# Patient Record
Sex: Male | Born: 1945 | Race: White | Hispanic: No | Marital: Married | Smoking: Former smoker
Health system: Southern US, Community
[De-identification: ages and names within clinical notes are randomized; demographics above are authoritative.]

## PROBLEM LIST (undated history)

## (undated) DIAGNOSIS — I779 Disorder of arteries and arterioles, unspecified: Secondary | ICD-10-CM

## (undated) DIAGNOSIS — I10 Essential (primary) hypertension: Secondary | ICD-10-CM

## (undated) DIAGNOSIS — C801 Malignant (primary) neoplasm, unspecified: Secondary | ICD-10-CM

## (undated) DIAGNOSIS — Z9289 Personal history of other medical treatment: Secondary | ICD-10-CM

## (undated) DIAGNOSIS — I739 Peripheral vascular disease, unspecified: Secondary | ICD-10-CM

## (undated) DIAGNOSIS — Z87448 Personal history of other diseases of urinary system: Secondary | ICD-10-CM

## (undated) DIAGNOSIS — Z9889 Other specified postprocedural states: Secondary | ICD-10-CM

## (undated) DIAGNOSIS — Z8669 Personal history of other diseases of the nervous system and sense organs: Secondary | ICD-10-CM

## (undated) DIAGNOSIS — E785 Hyperlipidemia, unspecified: Secondary | ICD-10-CM

## (undated) DIAGNOSIS — I2510 Atherosclerotic heart disease of native coronary artery without angina pectoris: Secondary | ICD-10-CM

## (undated) DIAGNOSIS — Z8546 Personal history of malignant neoplasm of prostate: Secondary | ICD-10-CM

## (undated) HISTORY — PX: LIPOMA EXCISION: SHX5283

## (undated) HISTORY — PX: ANGIOPLASTY: SHX39

## (undated) HISTORY — DX: Malignant (primary) neoplasm, unspecified: C80.1

## (undated) HISTORY — DX: Atherosclerotic heart disease of native coronary artery without angina pectoris: I25.10

## (undated) HISTORY — PX: POLYPECTOMY: SHX149

## (undated) HISTORY — DX: Personal history of malignant neoplasm of prostate: Z85.46

## (undated) HISTORY — DX: Peripheral vascular disease, unspecified: I73.9

## (undated) HISTORY — DX: Personal history of other medical treatment: Z92.89

## (undated) HISTORY — DX: Disorder of arteries and arterioles, unspecified: I77.9

## (undated) HISTORY — DX: Other specified postprocedural states: Z98.890

## (undated) HISTORY — PX: COLONOSCOPY: SHX174

## (undated) HISTORY — DX: Hyperlipidemia, unspecified: E78.5

## (undated) HISTORY — PX: CATARACT EXTRACTION W/ INTRAOCULAR LENS IMPLANT: SHX1309

## (undated) HISTORY — DX: Personal history of other diseases of urinary system: Z87.448

## (undated) HISTORY — PX: WRIST GANGLION EXCISION: SHX840

## (undated) HISTORY — PX: HEMORRHOID SURGERY: SHX153

## (undated) HISTORY — DX: Personal history of other diseases of the nervous system and sense organs: Z86.69

## (undated) HISTORY — DX: Essential (primary) hypertension: I10

---

## 1998-06-19 ENCOUNTER — Inpatient Hospital Stay (HOSPITAL_COMMUNITY): Admission: EM | Admit: 1998-06-19 | Discharge: 1998-06-22 | Payer: Self-pay | Admitting: Emergency Medicine

## 1998-06-19 ENCOUNTER — Encounter: Payer: Self-pay | Admitting: Emergency Medicine

## 2000-07-26 ENCOUNTER — Encounter: Payer: Self-pay | Admitting: Emergency Medicine

## 2000-07-26 ENCOUNTER — Inpatient Hospital Stay (HOSPITAL_COMMUNITY): Admission: EM | Admit: 2000-07-26 | Discharge: 2000-07-30 | Payer: Self-pay | Admitting: Emergency Medicine

## 2000-07-26 HISTORY — PX: CARDIAC CATHETERIZATION: SHX172

## 2000-07-27 ENCOUNTER — Encounter: Payer: Self-pay | Admitting: Cardiovascular Disease

## 2001-10-14 ENCOUNTER — Ambulatory Visit (HOSPITAL_COMMUNITY): Admission: RE | Admit: 2001-10-14 | Discharge: 2001-10-14 | Payer: Self-pay | Admitting: Internal Medicine

## 2003-02-27 DIAGNOSIS — I2510 Atherosclerotic heart disease of native coronary artery without angina pectoris: Secondary | ICD-10-CM

## 2003-02-27 HISTORY — PX: PTCA: SHX146

## 2003-02-27 HISTORY — DX: Atherosclerotic heart disease of native coronary artery without angina pectoris: I25.10

## 2003-08-04 ENCOUNTER — Observation Stay (HOSPITAL_COMMUNITY): Admission: EM | Admit: 2003-08-04 | Discharge: 2003-08-05 | Payer: Self-pay | Admitting: Emergency Medicine

## 2003-08-31 ENCOUNTER — Inpatient Hospital Stay (HOSPITAL_BASED_OUTPATIENT_CLINIC_OR_DEPARTMENT_OTHER): Admission: RE | Admit: 2003-08-31 | Discharge: 2003-08-31 | Payer: Self-pay | Admitting: Cardiovascular Disease

## 2004-01-06 ENCOUNTER — Ambulatory Visit: Payer: Self-pay | Admitting: Internal Medicine

## 2004-09-11 ENCOUNTER — Ambulatory Visit: Payer: Self-pay | Admitting: Internal Medicine

## 2004-09-18 ENCOUNTER — Ambulatory Visit: Payer: Self-pay | Admitting: Internal Medicine

## 2004-11-20 ENCOUNTER — Ambulatory Visit: Payer: Self-pay | Admitting: Internal Medicine

## 2004-12-22 ENCOUNTER — Ambulatory Visit: Payer: Self-pay | Admitting: Internal Medicine

## 2005-01-26 ENCOUNTER — Ambulatory Visit: Payer: Self-pay | Admitting: Cardiovascular Disease

## 2005-03-01 ENCOUNTER — Ambulatory Visit: Admission: RE | Admit: 2005-03-01 | Discharge: 2005-04-02 | Payer: Self-pay | Admitting: Radiation Oncology

## 2005-03-29 DIAGNOSIS — Z8546 Personal history of malignant neoplasm of prostate: Secondary | ICD-10-CM

## 2005-03-29 HISTORY — DX: Personal history of malignant neoplasm of prostate: Z85.46

## 2005-03-29 HISTORY — PX: PROSTATECTOMY: SHX69

## 2005-04-18 ENCOUNTER — Inpatient Hospital Stay (HOSPITAL_COMMUNITY): Admission: RE | Admit: 2005-04-18 | Discharge: 2005-04-20 | Payer: Self-pay | Admitting: Urology

## 2005-04-18 ENCOUNTER — Encounter (INDEPENDENT_AMBULATORY_CARE_PROVIDER_SITE_OTHER): Payer: Self-pay | Admitting: Specialist

## 2005-04-26 ENCOUNTER — Emergency Department (HOSPITAL_COMMUNITY): Admission: EM | Admit: 2005-04-26 | Discharge: 2005-04-26 | Payer: Self-pay | Admitting: Emergency Medicine

## 2005-04-30 ENCOUNTER — Observation Stay (HOSPITAL_COMMUNITY): Admission: EM | Admit: 2005-04-30 | Discharge: 2005-05-01 | Payer: Self-pay | Admitting: Urology

## 2005-06-21 ENCOUNTER — Ambulatory Visit: Payer: Self-pay | Admitting: Cardiovascular Disease

## 2005-10-08 ENCOUNTER — Ambulatory Visit: Payer: Self-pay

## 2005-10-08 ENCOUNTER — Ambulatory Visit: Payer: Self-pay | Admitting: Cardiovascular Disease

## 2005-12-12 ENCOUNTER — Ambulatory Visit: Payer: Self-pay | Admitting: Internal Medicine

## 2005-12-12 LAB — CONVERTED CEMR LAB
ALT: 25 units/L (ref 0–40)
Bacteria, U Microscopic: NEGATIVE /hpf
Basophils Absolute: 0 10*3/uL (ref 0.0–0.1)
Basophils Relative: 0.4 % (ref 0.0–1.0)
Bilirubin Urine: NEGATIVE
CO2: 29 meq/L (ref 19–32)
Chloride: 106 meq/L (ref 96–112)
Chol/HDL Ratio, serum: 3.9
Creatinine, Ser: 1.3 mg/dL (ref 0.4–1.5)
HCT: 45.1 % (ref 39.0–52.0)
HDL: 44.1 mg/dL (ref >39.0)
Hemoglobin: 15.5 g/dL (ref 13.0–17.0)
Lymphocytes Relative: 35 % (ref 12.0–46.0)
MCHC: 34.5 g/dL (ref 30.0–36.0)
MCV: 93.8 fL (ref 78.0–100.0)
Mucus, UA: NEGATIVE
Nitrite: NEGATIVE
PSA: 0.01 ng/mL — ABNORMAL LOW (ref 0.10–4.00)
Platelets: 253 10*3/uL (ref 150–400)
Sodium: 142 meq/L (ref 135–145)
Specific Gravity, Urine: 1.02 (ref 1.000–1.03)
TSH: 1.28 microintl units/mL (ref 0.35–5.50)
Total Protein, Urine: NEGATIVE mg/dL
Total Protein: 6.8 g/dL (ref 6.0–8.3)
Triglyceride fasting, serum: 136 mg/dL (ref 0–149)
WBC: 6.1 10*3/uL (ref 4.5–10.5)

## 2005-12-19 ENCOUNTER — Ambulatory Visit: Payer: Self-pay | Admitting: Internal Medicine

## 2006-02-25 ENCOUNTER — Ambulatory Visit: Payer: Self-pay | Admitting: Internal Medicine

## 2006-02-25 LAB — CONVERTED CEMR LAB
ALT: 32 units/L (ref 0–40)
Albumin: 4 g/dL (ref 3.5–5.2)
Chol/HDL Ratio, serum: 4.1
Cholesterol: 155 mg/dL (ref 0–200)
HDL: 37.8 mg/dL — ABNORMAL LOW (ref >39.0)
LDL Cholesterol: 91 mg/dL (ref 0–99)
Total Bilirubin: 1 mg/dL (ref 0.3–1.2)
Triglyceride fasting, serum: 133 mg/dL (ref 0–149)

## 2006-02-27 ENCOUNTER — Ambulatory Visit: Payer: Self-pay | Admitting: Internal Medicine

## 2006-06-27 ENCOUNTER — Ambulatory Visit: Payer: Self-pay | Admitting: Internal Medicine

## 2006-10-21 ENCOUNTER — Ambulatory Visit: Payer: Self-pay | Admitting: Cardiovascular Disease

## 2006-11-06 DIAGNOSIS — Z8669 Personal history of other diseases of the nervous system and sense organs: Secondary | ICD-10-CM

## 2006-11-06 HISTORY — DX: Personal history of other diseases of the nervous system and sense organs: Z86.69

## 2007-03-07 ENCOUNTER — Encounter: Payer: Self-pay | Admitting: *Deleted

## 2007-03-07 DIAGNOSIS — Z8546 Personal history of malignant neoplasm of prostate: Secondary | ICD-10-CM

## 2007-03-07 DIAGNOSIS — Z951 Presence of aortocoronary bypass graft: Secondary | ICD-10-CM

## 2007-03-07 DIAGNOSIS — I2510 Atherosclerotic heart disease of native coronary artery without angina pectoris: Secondary | ICD-10-CM

## 2007-03-07 DIAGNOSIS — Z9079 Acquired absence of other genital organ(s): Secondary | ICD-10-CM | POA: Insufficient documentation

## 2007-03-07 DIAGNOSIS — Z9189 Other specified personal risk factors, not elsewhere classified: Secondary | ICD-10-CM

## 2007-03-07 DIAGNOSIS — Z9861 Coronary angioplasty status: Secondary | ICD-10-CM | POA: Insufficient documentation

## 2007-03-07 DIAGNOSIS — E7800 Pure hypercholesterolemia, unspecified: Secondary | ICD-10-CM

## 2007-03-07 DIAGNOSIS — Z87448 Personal history of other diseases of urinary system: Secondary | ICD-10-CM

## 2007-03-07 DIAGNOSIS — I10 Essential (primary) hypertension: Secondary | ICD-10-CM | POA: Insufficient documentation

## 2007-05-13 ENCOUNTER — Encounter: Payer: Self-pay | Admitting: Internal Medicine

## 2007-05-22 ENCOUNTER — Telehealth: Payer: Self-pay | Admitting: Internal Medicine

## 2007-07-16 ENCOUNTER — Ambulatory Visit: Payer: Self-pay | Admitting: Internal Medicine

## 2007-07-16 LAB — CONVERTED CEMR LAB
AST: 23 units/L (ref 0–37)
Alkaline Phosphatase: 48 units/L (ref 39–117)
BUN: 15 mg/dL (ref 6–23)
Bacteria, UA: NEGATIVE
Bilirubin Urine: NEGATIVE
CO2: 29 meq/L (ref 19–32)
Calcium: 9.4 mg/dL (ref 8.4–10.5)
Eosinophils Absolute: 0 10*3/uL (ref 0.0–0.7)
GFR calc non Af Amer: 65 mL/min
Glucose, Bld: 122 mg/dL — ABNORMAL HIGH (ref 70–99)
HCT: 46.5 % (ref 39.0–52.0)
Hemoglobin: 15.7 g/dL (ref 13.0–17.0)
Ketones, ur: NEGATIVE mg/dL
Leukocytes, UA: NEGATIVE
MCHC: 33.8 g/dL (ref 30.0–36.0)
MCV: 94.8 fL (ref 78.0–100.0)
Monocytes Absolute: 0.5 10*3/uL (ref 0.1–1.0)
Monocytes Relative: 9.5 % (ref 3.0–12.0)
Mucus, UA: NEGATIVE
Nitrite: NEGATIVE
RBC: 4.9 M/uL (ref 4.22–5.81)
Specific Gravity, Urine: 1.01 (ref 1.000–1.03)
Squamous Epithelial / LPF: NEGATIVE /lpf
TSH: 1.06 microintl units/mL (ref 0.35–5.50)
Urine Glucose: NEGATIVE mg/dL
Urobilinogen, UA: 0.2 (ref 0.0–1.0)
WBC, UA: NONE SEEN cells/hpf
WBC: 5.5 10*3/uL (ref 4.5–10.5)

## 2007-07-23 ENCOUNTER — Ambulatory Visit: Payer: Self-pay | Admitting: Internal Medicine

## 2007-07-23 DIAGNOSIS — E119 Type 2 diabetes mellitus without complications: Secondary | ICD-10-CM

## 2007-07-23 DIAGNOSIS — E118 Type 2 diabetes mellitus with unspecified complications: Secondary | ICD-10-CM | POA: Insufficient documentation

## 2007-10-13 ENCOUNTER — Ambulatory Visit: Payer: Self-pay | Admitting: Cardiovascular Disease

## 2008-01-07 ENCOUNTER — Encounter: Payer: Self-pay | Admitting: Internal Medicine

## 2008-05-12 ENCOUNTER — Encounter: Payer: Self-pay | Admitting: Internal Medicine

## 2008-08-24 ENCOUNTER — Ambulatory Visit: Payer: Self-pay | Admitting: Internal Medicine

## 2008-08-24 LAB — CONVERTED CEMR LAB
AST: 21 units/L (ref 0–37)
Alkaline Phosphatase: 49 units/L (ref 39–117)
BUN: 22 mg/dL (ref 6–23)
Bilirubin, Direct: 0.2 mg/dL (ref 0.0–0.3)
Chloride: 105 meq/L (ref 96–112)
Eosinophils Absolute: 0.1 10*3/uL (ref 0.0–0.7)
Eosinophils Relative: 1.1 % (ref 0.0–5.0)
GFR calc non Af Amer: 64.54 mL/min (ref >60)
HCT: 41.9 % (ref 39.0–52.0)
HDL: 40.8 mg/dL (ref >39.00)
Leukocytes, UA: NEGATIVE
Lymphs Abs: 2.2 10*3/uL (ref 0.7–4.0)
MCHC: 35.4 g/dL (ref 30.0–36.0)
Neutro Abs: 2.6 10*3/uL (ref 1.4–7.7)
Neutrophils Relative %: 48.1 % (ref 43.0–77.0)
Nitrite: NEGATIVE
RBC: 4.52 M/uL (ref 4.22–5.81)
RDW: 12 % (ref 11.5–14.6)
Sodium: 140 meq/L (ref 135–145)
Total Protein, Urine: NEGATIVE mg/dL
Triglycerides: 75 mg/dL (ref 0.0–149.0)
Urobilinogen, UA: 0.2 (ref 0.0–1.0)
VLDL: 15 mg/dL (ref 0.0–40.0)
WBC: 5.5 10*3/uL (ref 4.5–10.5)
pH: 5.5 (ref 5.0–8.0)

## 2008-09-01 ENCOUNTER — Ambulatory Visit: Payer: Self-pay | Admitting: Internal Medicine

## 2008-11-17 ENCOUNTER — Encounter: Payer: Self-pay | Admitting: Internal Medicine

## 2008-12-09 ENCOUNTER — Ambulatory Visit: Payer: Self-pay | Admitting: Cardiovascular Disease

## 2008-12-15 ENCOUNTER — Telehealth: Payer: Self-pay | Admitting: Internal Medicine

## 2008-12-24 ENCOUNTER — Encounter: Payer: Self-pay | Admitting: Internal Medicine

## 2009-06-23 ENCOUNTER — Encounter: Payer: Self-pay | Admitting: Internal Medicine

## 2009-10-28 ENCOUNTER — Ambulatory Visit: Payer: Self-pay | Admitting: Internal Medicine

## 2009-10-28 LAB — CONVERTED CEMR LAB
ALT: 44 units/L (ref 0–53)
Albumin: 4.2 g/dL (ref 3.5–5.2)
Alkaline Phosphatase: 44 units/L (ref 39–117)
Basophils Absolute: 0 10*3/uL (ref 0.0–0.1)
Basophils Relative: 0.3 % (ref 0.0–3.0)
Bilirubin Urine: NEGATIVE
Bilirubin, Direct: 0.2 mg/dL (ref 0.0–0.3)
Calcium: 9.3 mg/dL (ref 8.4–10.5)
Chloride: 100 meq/L (ref 96–112)
Cholesterol: 157 mg/dL (ref 0–200)
HCT: 44.5 % (ref 39.0–52.0)
Hemoglobin: 15.4 g/dL (ref 13.0–17.0)
Lymphocytes Relative: 33.7 % (ref 12.0–46.0)
MCHC: 34.6 g/dL (ref 30.0–36.0)
MCV: 95.3 fL (ref 78.0–100.0)
Neutro Abs: 4.2 10*3/uL (ref 1.4–7.7)
Neutrophils Relative %: 55.1 % (ref 43.0–77.0)
PSA: 0 ng/mL — ABNORMAL LOW (ref 0.10–4.00)
Platelets: 239 10*3/uL (ref 150.0–400.0)
RBC: 4.67 M/uL (ref 4.22–5.81)
RDW: 13.5 % (ref 11.5–14.6)
Sodium: 137 meq/L (ref 135–145)
Specific Gravity, Urine: 1.02 (ref 1.000–1.030)
TSH: 2.34 microintl units/mL (ref 0.35–5.50)
Total CHOL/HDL Ratio: 4
Total Protein: 6.6 g/dL (ref 6.0–8.3)
Triglycerides: 194 mg/dL — ABNORMAL HIGH (ref 0.0–149.0)
VLDL: 38.8 mg/dL (ref 0.0–40.0)

## 2009-11-03 ENCOUNTER — Ambulatory Visit: Payer: Self-pay | Admitting: Internal Medicine

## 2009-12-08 ENCOUNTER — Ambulatory Visit: Payer: Self-pay | Admitting: Cardiovascular Disease

## 2009-12-28 ENCOUNTER — Encounter: Payer: Self-pay | Admitting: Internal Medicine

## 2010-03-28 NOTE — Assessment & Plan Note (Signed)
Summary: cpx-lb   Vital Signs:  Patient profile:   65 year old male Height:      65 inches Weight:      178 pounds BMI:     29.73 O2 Sat:      96 % Temp:     97.9 degrees F oral Pulse rate:   69 / minute BP sitting:   144 / 78  (left arm) Cuff size:   regular  Vitals Entered By: Alysia Penna (November 03, 2009 1:29 PM) CC: cpx/cp sma Comments pt needs refill on vytorin sent to [facility]. /cp sma  Vision Screening:      Vision Comments: eye exam may 2011. normal with no changes.   Primary Care Provider:  Norins  CC:  cpx/cp sma.  History of Present Illness: Patient presents for maintenance exam. He reports that he is generally feeling except for the normal augue and ache of 64 years. He is walking 2.5 miles a day, down from 5 miles, but no limiting symptoms, e.g. chest pain or SOB. He does have some heel pain. He is living a healthy life-style: low fat, low carb diet with controlled calorie intake.   Current Medications (verified): 1)  Vytorin 10-80 Mg  Tabs (Ezetimibe-Simvastatin) .... At Bedtime 2)  Multivitamins   Tabs (Multiple Vitamin) .... Take One Tablet Once Daily 3)  Lisinopril 10 Mg  Tabs (Lisinopril) .... Take 1/2 Tablet Once Daily 4)  Alprazolam 0.25 Mg Tabs (Alprazolam) .Marland Kitchen.. 1 Q 6 As Needed 5)  Aspirin 81 Mg  Tabs (Aspirin) .... Daily  Allergies (verified): No Known Drug Allergies  Past History:  Past Medical History: Last updated: 12/06/2008 Current Problems:  HYPERCHOLESTEROLEMIA (ICD-272.0) HYPERTENSION (ICD-401.9) CORONARY ARTERY DISEASE (ICD-414.00) prev stents RCA and circ. Cath 2005 patent with 70% PDA ROUTINE GENERAL MEDICAL EXAM@HEALTH  CARE FACL (ICD-V70.0) AODM (ICD-250.00) EXCISION OF GANGLION CYST, WRIST, HX OF (ICD-V15.89) PROSTATE CANCER, HX OF (ICD-V10.46) HEMATURIA, HX OF (ICD-V13.09) * I&D THROMBOSED HEMORRHOID PROSTATECTOMY, HX OF (ICD-V45.77) PERCUTANEOUS TRANSLUMINAL CORONARY ANGIOPLASTY, HX OF (ICD-V45.82) LIPOMA,EXCISION  (ICD-214.9) DETACHED RETINA-left  treated at [facility].  Sept '08 - gas bubble, then laser surgery  Oct '08  Past Surgical History: Last updated: 12/06/2008  Cardiac CatheterizationRESULTS: Initially the stenosis in the proximal to mid right coronary artery was estimated at 95%.  Following stenting, this improved to 0%.  There wasresidual mild disease in the ostium of the proximal right coronary artery Successful stenting of the proximal to mid right coronary artery with improvement in stent diameter narrowing from 95% to 0%.Proc. Date: 06/03/02Adm. Date:  [id number] Attending:  Charlton Haws CharlesCC: Rosalyn Gess. Norins, M.D. [facility]    EXCISION OF GANGLION CYST, WRIST, HX OF (ICD-V15.89) * I&D THROMBOSED HEMORRHOID PROSTATECTOMY, HX OF (ICD-V45.77) PERCUTANEOUS TRANSLUMINAL CORONARY ANGIOPLASTY, HX OF (ICD-V45.82) LIPOMA,EXCISION (ICD-214.9)  Family History: Last updated: 06-Aug-2007 father-deceased @56 : CAD/MI fatal, which was 2nd; smoker mother - deceased @ 51: died after GB surgery Neg- colon cancer Sister - DM   Social History: Last updated: 06-Aug-2007 HSG Married '64 - 17 years divorced; '81 - 4 years divorced; '94 - 0 chidlren work: Dealer, Architect  Risk Factors: Alcohol Use: 0 (09/01/2008) Caffeine Use: 0 (09/01/2008) Diet: heart healthy (09/01/2008) Exercise: yes (09/01/2008)  Risk Factors: Smoking Status: quit (09/01/2008)  Review of Systems  The patient denies anorexia, fever, weight loss, weight gain, vision loss, decreased hearing, hoarseness, chest pain, dyspnea on exertion, peripheral edema, headaches, abdominal pain, severe indigestion/heartburn, muscle weakness, suspicious skin lesions, difficulty walking, depression, unusual weight change,  enlarged lymph nodes, and angioedema.    Physical Exam  General:  WNWD white male in no distress Head:  Normocephalic and atraumatic without obvious abnormalities. No apparent alopecia or balding. Eyes:  No corneal  or conjunctival inflammation noted. EOMI. Perrla. Funduscopic exam benign, without hemorrhages, exudates or papilledema. Vision grossly normal. Ears:  External ear exam shows no significant lesions or deformities.  Otoscopic examination reveals clear canals, tympanic membranes are intact bilaterally without bulging, retraction, inflammation or discharge. Hearing is grossly normal bilaterally. Nose:  no external deformity and no external erythema.   Mouth:  Oral mucosa and oropharynx without lesions or exudates.  Teeth in good repair. Neck:  supple, full ROM, no thyromegaly, and no carotid bruits.   Chest Wall:  no deformities and no masses.   Lungs:  Normal respiratory effort, chest expands symmetrically. Lungs are clear to auscultation, no crackles or wheezes. Heart:  Normal rate and regular rhythm. S1 and S2 normal without gallop, murmur, click, rub or other extra sounds. Abdomen:  soft, non-tender, normal bowel sounds, no masses, no guarding, and no hepatomegaly.   Prostate:  deferred Msk:  normal ROM, no joint tenderness, no joint swelling, no joint warmth, no redness over joints, and no joint instability.   Pulses:  2+ radial pulses Extremities:  No clubbing, cyanosis, edema, or deformity noted with normal full range of motion of all joints.   Neurologic:  alert & oriented X3, cranial nerves II-XII intact, strength normal in all extremities, sensation intact to light touch, sensation intact to pinprick, and DTRs symmetrical and normal.   Skin:  turgor normal, color normal, no rashes, no suspicious lesions, and no ulcerations.   Cervical Nodes:  no anterior cervical adenopathy and no posterior cervical adenopathy.   Inguinal Nodes:  no R inguinal adenopathy and no L inguinal adenopathy.   Psych:  Oriented X3, memory intact for recent and remote, and normally interactive.     Impression & Recommendations:  Problem # 1:  HYPERCHOLESTEROLEMIA (ICD-272.0)  His updated medication list for this  problem includes:    Vytorin 10-80 Mg Tabs (Ezetimibe-simvastatin) .Marland Kitchen... At bedtime  Labs Reviewed: SGOT: 32 (10/28/2009)   SGPT: 44 (10/28/2009)   HDL:43.70 (10/28/2009), 40.80 (08/24/2008)  LDL:75 (10/28/2009), 69 (08/24/2008)  Chol:157 (10/28/2009), 125 (08/24/2008)  Trig:194.0 (10/28/2009), 75.0 (08/24/2008)  Excellent control. Will continue present medications  Problem # 2:  HYPERTENSION (ICD-401.9)  His updated medication list for this problem includes:    Lisinopril 10 Mg Tabs (Lisinopril) .Marland Kitchen... Take 1/2 tablet once daily  BP today: 144/78 Prior BP: 130/70 (12/09/2008)  Labs Reviewed: K+: 4.7 (10/28/2009) Creat: : 1.2 (10/28/2009)  Mild elevation in BP today, generally well controlled. Plan is to continue present medications.  Problem # 3:  AODM (ICD-250.00) Reviewed labs back to '07 - highest serum glucose was 122 with all other readings being in normal range.  Plan - prudent diet but no other treatment or testing.   His updated medication list for this problem includes:    Lisinopril 10 Mg Tabs (Lisinopril) .Marland Kitchen... Take 1/2 tablet once daily    Aspirin 81 Mg Tabs (Aspirin) .Marland Kitchen... Daily  Labs Reviewed: Creat: 1.2 (10/28/2009)     Problem # 4:  PROSTATE CANCER, HX OF (ICD-V10.46) Doing very well and followed by urology on a routine basis. PSA was undetectable.  Problem # 5:  Preventive Health Care (ICD-V70.0) Normal interval history. Normal physical exam. Lab results are excellent. He is current with pneumovax. He reports having had zostavax at hospital.  He is current with colorectal cancer screening with last exam in '03.  He is 100% independent in ADLs. 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   In summary - a delightful gentleman who is medically stable and doing well. He will return as needed or 1 year.   Complete Medication List: 1)  Vytorin 10-80 Mg Tabs (Ezetimibe-simvastatin)  .... At bedtime 2)  Multivitamins Tabs (Multiple vitamin) .... Take one tablet once daily 3)  Lisinopril 10 Mg Tabs (Lisinopril) .... Take 1/2 tablet once daily 4)  Alprazolam 0.25 Mg Tabs (Alprazolam) .Marland Kitchen.. 1 q 6 as needed 5)  Aspirin 81 Mg Tabs (Aspirin) .... Daily  Other Orders: Pneumococcal Vaccine (16109) Admin 1st Vaccine (60454)   Patient: Clifford [name] Note: All result statuses are Final unless otherwise noted.  Tests: (1) Lipid Panel (LIPID)   Cholesterol               157 mg/dL                   0-981     ATP III Classification            Desirable:  < 200 mg/dL                    Borderline High:  200 - 239 mg/dL               High:  > = 240 mg/dL   Triglycerides        [H]  194.0 mg/dL                 1.9-147.8     Normal:  <150 mg/dL     Borderline High:  295 - 199 mg/dL   HDL                       62.13 mg/dL                 >08.65   VLDL Cholesterol          38.8 mg/dL                  7.8-46.9   LDL Cholesterol           75 mg/dL                    6-29  CHO/HDL Ratio:  CHD Risk                             4                    Men          Women     1/2 Average Risk     3.4          3.3     Average Risk          5.0          4.4     2X Average Risk          9.6          7.1     3X Average Risk          15.0          11.0  Tests: (2) BMP (METABOL)   Sodium                    137 mEq/L                   135-145   Potassium                 4.7 mEq/L                   3.5-5.1   Chloride                  100 mEq/L                   96-112   Carbon Dioxide            27 mEq/L                    19-32   Glucose              [H]  102 mg/dL                   16-10   BUN                       14 mg/dL                    9-60   Creatinine                1.2 mg/dL                   4.5-4.0   Calcium                   9.3 mg/dL                   9.8-11.9   GFR                       67.55 mL/min                >60  Tests: (3) CBC Platelet w/Diff  (CBCD)   White Cell Count          7.7 K/uL                    4.5-10.5   Red Cell Count            4.67 Mil/uL                 4.22-5.81   Hemoglobin                15.4 g/dL                   14.7-82.9   Hematocrit                44.5 %                      39.0-52.0   MCV                       95.3 fl                     78.0-100.0   MCHC  34.6 g/dL                   16.1-09.6   RDW                       13.5 %                      11.5-14.6   Platelet Count            239.0 K/uL                  150.0-400.0   Neutrophil %              55.1 %                      43.0-77.0   Lymphocyte %              33.7 %                      12.0-46.0   Monocyte %                9.6 %                       3.0-12.0   Eosinophils%              1.3 %                       0.0-5.0   Basophils %               0.3 %                       0.0-3.0   Neutrophill Absolute      4.2 K/uL                    1.4-7.7   Lymphocyte Absolute       2.6 K/uL                    0.7-4.0   Monocyte Absolute         0.7 K/uL                    0.1-1.0  Eosinophils, Absolute                             0.1 K/uL                    0.0-0.7   Basophils Absolute        0.0 K/uL                    0.0-0.1  Tests: (4) Hepatic/Liver Function Panel (HEPATIC)   Total Bilirubin           1.2 mg/dL                   0.4-5.4   Direct Bilirubin          0.2 mg/dL                   0.9-8.1   Alkaline Phosphatase      44 U/L  39-117   AST                       32 U/L                      0-37   ALT                       44 U/L                      0-53   Total Protein             6.6 g/dL                    6.0-7.3   Albumin                   4.2 g/dL                    7.1-0.6  Tests: (5) TSH (TSH)   FastTSH                   2.34 uIU/mL                 0.35-5.50  Tests: (6) Prostate Specific Antigen (PSA)   PSA-Hyb              [L]                              0.10-4.00       RESULT: 0.00  Repeated and verified X2. ng/mL  Tests: (7) UDip w/Micro (URINE)   Color                     LT. YELLOW       RANGE:  Yellow;Lt. Yellow   Clarity                   CLEAR                       Clear   Specific Gravity          1.020                       1.000 - 1.030   Urine Ph                  5.5                         5.0-8.0   Protein                   NEGATIVE                    Negative   Urine Glucose             NEGATIVE                    Negative   Ketones                   NEGATIVE                    Negative   Urine Bilirubin  NEGATIVE                    Negative   Blood                     MODERATE                    Negative   Urobilinogen              0.2                         0.0 - 1.0   Leukocyte Esterace        NEGATIVE                    Negative   Nitrite                   NEGATIVE                    Negative   Urine RBC                 0-2/hpf                     0-2/hpf   Urine Mucus               Presence of                 None   Urine Epith               Rare(0-4/hpf)               Rare(0-4/hpf)Prescriptions: VYTORIN 10-80 MG  TABS (EZETIMIBE-SIMVASTATIN) at bedtime  #90 x 3   Entered and Authorized by:   Jacques Navy MD   Signed by:   Jacques Navy MD on 11/03/2009   Method used:   Faxed to ...       [facility] (mail-order)             , [place]         Ph: [phone]       Fax: [phone]   RxID:   [id number]    Immunizations Administered:  Pneumonia Vaccine:    Vaccine Type: Pneumovax    Site: left deltoid    Mfr: Merck    Dose: 0.5 ml    Route: IM    Given by: Alysia Penna SMA    Exp. Date: 03/19/2011    Lot #: [id number]    VIS given: 01/31/09 version given November 03, 2009.

## 2010-03-28 NOTE — Letter (Signed)
[facility]   Imported By: Sherian Rein 06/28/2009 15:06:59  _____________________________________________________________________  External Attachment:    Type:   Image     Comment:   External Document

## 2010-03-28 NOTE — Letter (Signed)
Summary: [facility]   Imported By: Sherian Rein 01/04/2010 10:42:07  _____________________________________________________________________  External Attachment:    Type:   Image     Comment:   External Document

## 2010-03-28 NOTE — Assessment & Plan Note (Signed)
Summary: F1Y/DM      Allergies Added: NKDA  Primary Provider:  Norins  CC:  check up.  History of Present Illness: Clifford [name] is seen today for F/U of CAD,HTN,elevated lipids.  He has a distant history of CAD with PCI in 95, 2000 and 2002. Last cath in 2005 showed patent stents to the RCA and Circ wit h70% PDA disease in a small vessel.  He has done well with medical therapy.  His last myovue in 2008 showed small inferobasal MI with no ischemia.  He walks 5 miles/day with no angina.  His risk factors are well modified and lab work followed by Dr.Norrins.  There has been no PND, orhtopnea, palpitations, or edema.  He has a history of prostate CA and has regular F/U with Dr. Ludwig Lean with an undetectable PSA  Review of labs from 9/11 shows LDL of 75 with normal LFT's   Current Problems (verified): 1)  Hypercholesterolemia  (ICD-272.0) 2)  Hypertension  (ICD-401.9) 3)  Coronary Artery Disease  (ICD-414.00) 4)  Coronary Artery Bypass Graft, Four Vessel, Hx of  (ICD-V45.81) 5)  Routine General Medical Exam@health  Care Facl  (ICD-V70.0) 6)  Aodm  (ICD-250.00) 7)  Excision of Ganglion Cyst, Wrist, Hx of  (ICD-V15.89) 8)  Prostate Cancer, Hx of  (ICD-V10.46) 9)  Hematuria, Hx of  (ICD-V13.09) 10)  I&d Thrombosed Hemorrhoid  () 11)  Prostatectomy, Hx of  (ICD-V45.77) 12)  Percutaneous Transluminal Coronary Angioplasty, Hx of  (ICD-V45.82) 13)  Lipoma,excision  (ICD-214.9)  Current Medications (verified): 1)  Vytorin 10-80 Mg  Tabs (Ezetimibe-Simvastatin) .... At Bedtime 2)  Multivitamins   Tabs (Multiple Vitamin) .... Take One Tablet Once Daily 3)  Lisinopril 10 Mg  Tabs (Lisinopril) .... Take 1/2 Tablet Once Daily 4)  Alprazolam 0.25 Mg Tabs (Alprazolam) .Marland Kitchen.. 1 Q 6 As Needed 5)  Aspirin 81 Mg  Tabs (Aspirin) .... Daily  Allergies (verified): No Known Drug Allergies  Past History:  Past Medical History: Last updated: 12/06/2008 Current Problems:  HYPERCHOLESTEROLEMIA  (ICD-272.0) HYPERTENSION (ICD-401.9) CORONARY ARTERY DISEASE (ICD-414.00) prev stents RCA and circ. Cath 2005 patent with 70% PDA ROUTINE GENERAL MEDICAL EXAM@HEALTH  CARE FACL (ICD-V70.0) AODM (ICD-250.00) EXCISION OF GANGLION CYST, WRIST, HX OF (ICD-V15.89) PROSTATE CANCER, HX OF (ICD-V10.46) HEMATURIA, HX OF (ICD-V13.09) * I&D THROMBOSED HEMORRHOID PROSTATECTOMY, HX OF (ICD-V45.77) PERCUTANEOUS TRANSLUMINAL CORONARY ANGIOPLASTY, HX OF (ICD-V45.82) LIPOMA,EXCISION (ICD-214.9) DETACHED RETINA-left  treated at [facility].  Sept '08 - gas bubble, then laser surgery  Oct '08  Past Surgical History: Last updated: 12/06/2008  Cardiac CatheterizationRESULTS: Initially the stenosis in the proximal to mid right coronary artery was estimated at 95%.  Following stenting, this improved to 0%.  There wasresidual mild disease in the ostium of the proximal right coronary artery Successful stenting of the proximal to mid right coronary artery with improvement in stent diameter narrowing from 95% to 0%.Proc. Date: 06/03/02Adm. Date:  [id number] Attending:  Charlton Haws CharlesCC: Rosalyn Gess. Norins, M.D. [facility]    EXCISION OF GANGLION CYST, WRIST, HX OF (ICD-V15.89) * I&D THROMBOSED HEMORRHOID PROSTATECTOMY, HX OF (ICD-V45.77) PERCUTANEOUS TRANSLUMINAL CORONARY ANGIOPLASTY, HX OF (ICD-V45.82) LIPOMA,EXCISION (ICD-214.9)  Family History: Last updated: 2007-08-02 father-deceased @58 : CAD/MI fatal, which was 2nd; smoker mother - deceased @ 25: died after GB surgery Neg- colon cancer Sister - DM   Social History: Last updated: 08-02-2007 HSG Married '64 - 17 years divorced; '81 - 4 years divorced; '94 - 0 chidlren work: Dealer, Architect  Review of Systems       Denies fever,  malais, weight loss, blurry vision, decreased visual acuity, cough, sputum, SOB, hemoptysis, pleuritic pain, palpitaitons, heartburn, abdominal pain, melena, lower extremity edema, claudication, or rash.   Vital  Signs:  Patient profile:   65 year old male Height:      65 inches Weight:      175 pounds BMI:     29.23 Pulse rate:   75 / minute Resp:     14 per minute BP sitting:   142 / 80  (left arm)  Vitals Entered By: Kem Parkinson (December 08, 2009 8:48 AM)  Physical Exam  General:  Affect appropriate Healthy:  appears stated age HEENT: normal Neck supple with no adenopathy JVP normal no bruits no thyromegaly Lungs clear with no wheezing and good diaphragmatic motion Heart:  S1/S2 no murmur,rub, gallop or click PMI normal Abdomen: benighn, BS positve, no tenderness, no AAA no bruit.  No HSM or HJR Distal pulses intact with no bruits No edema Neuro non-focal Skin warm and dry    Impression & Recommendations:  Problem # 1:  HYPERCHOLESTEROLEMIA (ICD-272.0) At goal with no side effects.  LFT's normal 9/11 His updated medication list for this problem includes:    Vytorin 10-80 Mg Tabs (Ezetimibe-simvastatin) .Marland Kitchen... At bedtime  Problem # 2:  HYPERTENSION (ICD-401.9) Well controlled continue exercise program and low sodium diet His updated medication list for this problem includes:    Lisinopril 10 Mg Tabs (Lisinopril) .Marland Kitchen... Take 1/2 tablet once daily    Aspirin 81 Mg Tabs (Aspirin) .Marland Kitchen... Daily  Problem # 3:  CORONARY ARTERY DISEASE (ICD-414.00)  No chest pain and active.  Myovue normal 2008.  Normla ECG  Continue medical Rx His updated medication list for this problem includes:    Lisinopril 10 Mg Tabs (Lisinopril) .Marland Kitchen... Take 1/2 tablet once daily    Aspirin 81 Mg Tabs (Aspirin) .Marland Kitchen... Daily  Orders: EKG w/ Interpretation (93000)  Patient Instructions: 1)  Your physician recommends that you schedule a follow-up appointment in: year with dr Eden Emms 2)  Your physician recommends that you continue on your current medications as directed. Please refer to the Current Medication list given to you today.   EKG Report  Procedure date:  12/08/2009  Findings:      NSR  75 Normal ECG

## 2010-04-28 ENCOUNTER — Encounter: Payer: Self-pay | Admitting: Internal Medicine

## 2010-05-09 NOTE — Letter (Signed)
[facility]   Imported By: Sherian Rein 05/04/2010 09:40:13  _____________________________________________________________________  External Attachment:    Type:   Image     Comment:   External Document

## 2010-06-19 ENCOUNTER — Other Ambulatory Visit: Payer: Self-pay | Admitting: Cardiovascular Disease

## 2010-07-11 NOTE — Assessment & Plan Note (Signed)
[facility]                            CARDIOLOGY OFFICE NOTE   NAME:[name], [name]                      MRN:          [id number]  DATE:10/21/2006                            DOB:          [birth year]    Clifford [name] returns today for followup. He is status post previous stent to  the circumflex artery. He has also had a previous stenting of the right  coronary artery.   The patient's last Myoview was October 08, 2005. He exercised for 6  minutes and 30 seconds. There was a small inferobasal infarct with no  ischemia. Clifford [name] has been doing well. He had a short bout of  hypertension and apparently saw Dr. Debby [name], an additional medicine to  his lisinopril was added but it made him more hypotensive and dizzy.  This was stopped. His blood pressures have been running fine. He has not  had any significant chest pain, PND, or orthopnea. There has been no  palpitations.   REVIEW OF SYSTEMS:  Otherwise negative.   CURRENT MEDICATIONS:  Include:  1. An aspirin a day.  2. Lisinopril 10 a day.  3. Vytorin 10/80.   His last LDL cholesterol done February 25, 2006 was 47 and his Vytorin  was increased by Dr. Debby [name]. His LFTs were normal.   His exam is remarkable for healthy-appearing elderly white male in no  distress. Affect is elevated as usual. Weight is up a few pounds to 171  from 164, blood pressure is 130/80, pulse 71 and regular, he is  afebrile, respiratory rate is 14.  HEENT: Normal. Carotids are normal without bruits. There is no  lymphadenopathy. No thyromegaly. No JVP elevation.  LUNGS: Clear with good diaphragmatic motion. No wheezing.  There is an S1, S2 with normal heart sounds. PMI is normal.  ABDOMEN: Benign. Bowel sounds positive. No tenderness. No  hepatosplenomegaly. No hepatojugular reflux. No triple A. No bruits.  Femorals are +4 bilaterally without bruits. PT s are +4. There is no  lower extremity edema.  NEURO: Nonfocal.  SKIN: Warm and  dry. There is no muscular weakness.   EKG is totally normal.   IMPRESSION:  1. Coronary artery disease, previous stents to the right coronary      artery and circumflex. Low risk Myoview a year ago. Asymptomatic      continue aspirin therapy. Patient's baseline heart rate is only 60      to 65. He is not on beta blockers.  2. Hypertension, continue low salt diet, lisinopril 10 mg a day.  3. Hypercholesterolemia, Vytorin increased by Dr. Debby [name]. Follow up      lipid and liver profile in 6 months.   Overall Clifford [name] is doing fine I will see him back in a year. He will  continue to see Dr. Debby [name] for his primary care needs.     [name]. Eden Emms, MD, [facility]  Electronically Signed    PCN/MedQ  DD: 10/21/2006  DT: 10/21/2006  Job #: [id number]

## 2010-07-11 NOTE — Assessment & Plan Note (Signed)
[facility]                            CARDIOLOGY OFFICE NOTE   NAME:Clifford [name], Clifford [name]                      MRN:          [id number]  DATE:10/13/2007                            DOB:          02-15-1946    [name] returns today for followup.  He has had a distant history of  stenting of the circ with an inferior wall MI.  His last cath in 2005,  showed patent stents and a 70-80% PDA lesion.  He is not having chest  pain.  His last Myoview was in August 2007.   At that time, he had small inferobasal wall infarct with no ischemia.   He has been active.  He is not having chest pain, PND, or orthopnea.  No  palpitations.  His weight has been up a little bit and he has been  little careless with his diet.   He has been compliant with his medications.  His LDL cholesterol is  under 80 on Vytorin.   REVIEW OF SYSTEMS:  Otherwise negative.   He enjoys tracking his stocks online.   CURRENT MEDICATIONS:  1. An aspirin a day.  2. Lisinopril 10 a day.  3. Toprol 25 a day.  4. Multivitamins and Vytorin 10/40.   PHYSICAL EXAMINATION:  GENERAL:  Remarkable for healthy-appearing  elderly white male, in no distress.  VITAL SIGNS:  His weight is 167, blood pressure is 140/88, pulse 60 and  regular, respiratory rate 14, afebrile.  HEENT:  Unremarkable.  Carotids are normal without bruit.  No lymphadenopathy, thyromegaly, or  JVP elevation.  He has a small scar under the left aspect of his lip.  LUNGS:  Clear.  Good diaphragmatic motion.  No wheezing.  ABDOMEN:  Benign.  Bowel sounds positive.  No AAA.  No tenderness.  No  bruit.  No hepatosplenomegaly.  No hepatojugular reflux.  No tenderness.  EXTREMITIES:  Distal pulses are intact.  No edema.  NEUROLOGIC:  Nonfocal.  SKIN:  Warm and dry.  MUSCULOSKELETAL:  No muscular weakness.   EKG is normal.   IMPRESSION:  1. Coronary artery disease.  Previous stenting of the circ and      inferior wall MI, currently  asymptomatic.  Continue aspirin and      beta-blocker.  Follow up Myoview, August 2010.  2. Hypertension, currently well-controlled.  Continue current dose of      ACE inhibitor.  3. Hypercholesteremia.  Continue Vytorin 10/40.  Lipid and liver      profile in 6 months.  4. Weight gain and borderline sugar.  Follow up with Dr. Debby Bud.      Consider sugar buster diet.  The patient understands relationship      between his weight and his carbohydrate intake with diet.  We      discussed this a bit.  He will follow up with Dr. Debby Bud regarding      his hemoglobin A1c.     Clifford [name]. Eden Emms, MD, [facility]  Electronically Signed    PCN/MedQ  DD: 10/13/2007  DT: 10/14/2007  Job #: [id number]

## 2010-07-14 NOTE — Cardiovascular Report (Signed)
NAME:  [name], Clifford [name]                         ACCOUNT NO.:  [id number]   MEDICAL RECORD NO.:  [id number]                   PATIENT TYPE:  OIB   LOCATION:  6501                                 FACILITY:  [facility]   PHYSICIAN:  Charlton Haws, M.D.                  DATE OF BIRTH:  [birth year]   DATE OF PROCEDURE:  08/31/2003  DATE OF DISCHARGE:                              CARDIAC CATHETERIZATION   PROCEDURE:  Cardiac catheterization.   CARDIOLOGIST:  Charlton Haws, M.D.   INDICATIONS:  [name] is a 65 year old patient of  Cardiology.  He  has a history of stents to the right coronary artery and circumflex.  His  Cardiolite study had inferior wall ischemia.  Study is done to rule out  restenosis.   DESCRIPTION OF PROCEDURE:  Cine catheterization is done from the right  femoral artery.  The patient had a mild vagal reaction which passed after 5-  10 minutes and was treated with just fluid.   FINDINGS:  1. The right coronary artery had 30-40% discrete disease proximally.  The     mid vessel stents were widely patent.  The distal vessel had 40-50%     discrete disease before the takeoff of the PDA.  The PDA itself was less     than 1.5 mm vessel.  There was a 70-80% tubular lesion in the mid vessel     of the PDA.  2. The left main coronary artery had a 20% discrete stenosis.  3. The left anterior descending artery was a medium size vessel.  4. There was 30% multiple discrete disease proximally.  The mid vessel had     30% multiple discrete lesions.  The distal vessel was normal size     disease.  5. The first diagonal branch had a 50% discrete lesion in the mid vessel.  6. The circumflex coronary artery was a large artery.  There were 30%     multiple discrete lesions proximally.  The mid vessel stents were widely     patent.  There was one large obtuse marginal branch without significant     disease.  7. The RAO ventriculography showed hyperdynamic LV function.  Ejection  fraction is in excess of 70%.  There is no significant mitral     insufficiency.  Aortic pressure is in the 120/64 range.  LV pressure is     121/16.   IMPRESSION:  Currently [name] is not having chest pain.  I believe the PDA  is a somewhat small vessel, and I would not recommend routine angioplasty  without stenting.   The stents to the circumflex and right coronary artery are widely patent.  I  think for the time being continued medical therapy is warranted.   I will see the patient back in the office in a couple of weeks.  At that  point, we will make a decision about stopping  his Plavix since his stents  are widely patent and have been in for some quite time.  I am not sure there  is added benefit since he is not having angina.   The patient tolerated the procedure reasonably well despite his vagal  reaction.                                              Charlton Haws, M.D.   PN/MEDQ  D:  08/31/2003  T:  08/31/2003  Job:  [id number]

## 2010-07-14 NOTE — H&P (Signed)
NAME:  Clifford [name], Clifford [name]                         ACCOUNT NO.:  [id number]   MEDICAL RECORD NO.:  [id number]                   PATIENT TYPE:  INP   LOCATION:  1827                                 FACILITY:  [facility]   PHYSICIAN:  Salvadore Farber, M.D. [facility]         DATE OF BIRTH:  1946-01-10   DATE OF ADMISSION:  08/04/2003  DATE OF DISCHARGE:                                HISTORY & PHYSICAL   CARDIOLOGIST:  Dr. Charlton Haws.   PRIMARY CARE PHYSICIAN:  Dr. Debby Bud   PRESENTING CIRCUMSTANCE:  I began having chest pain which would not go away  this morning.   HISTORY OF PRESENT ILLNESS:  Mr. [name] is a 65 year old male with a history  of coronary artery disease.  He is status post stents to the distal coronary  artery and the right coronary artery, mid point, in 1995.  In 2000, he had  intervention to the left coronary artery and once again in 2000, he had a  left heart catheterization which demonstrated the stent to the distal  circumflex patent.  There was a 95% lesion to the proximal edge of his mid  right coronary artery stent.  This was treated with cutting balloon therapy  at the same time.  He has done well since.  He notes that his presentations  in 1995, 2000, and 2002 have all been with exertional angina which  demonstrates as a burning feeling in the mid chest somewhat as if you were  taking cold air on a winter day as a child.  This morning after breakfast,  he felt insidious onset of epigastric pain which moved up into the chest  with progressive levels of pain.  There was also sharp pain through to the  back.  It felt as if he had some bloating in the right chest, and that it  might dissipate if her could only burp effectively.  He took Gaviscon and  aspirin with little relief.  Knowing his prior history and the fact that the  pain did not dissipate with aspirin and Gaviscon, he called emergency  medical services.  En route to [facility], he received four baby  aspirin and three nitroglycerin, but with little relief of pain.  With his  symptoms, he has had no concomitant symptoms, no dyspnea, no diaphoresis, no  nausea or vomiting.  It is not positional except for radiation through to  the back.  Chest x-ray taken on admission shows that the mediastinum is not  widened.  At the time of examination, he is on IV heparin, IV nitroglycerin,  and his pain has resolved.   ALLERGIES:  No known drug allergies.   MEDICATIONS:  1. Lisinopril 10 mg daily.  2. Enteric-coated aspirin, 81 mg daily.  3. Toprol XL 25 mg daily.  4. Lipitor 40 mg at bedtime.  5. Multivitamin.   PAST MEDICAL HISTORY:  1. Coronary artery disease, status post stents to the  distal circumflex and     mid right coronary artery in 1995.  2. His last left heart catheterization was in 2002.  The stent in the     circumflex was patent.  There was a 95% proximal stenosis at the proximal     edge of the mid right coronary artery stent.  This was treated with     cutting-balloon therapy.  3. Family history of coronary artery disease.  4. Dyslipidemia.  5. The patient denies a prior history of diabetes, gastric ulcer disease, GI     bleed, thyroid disease, pulmonary embolism, deep venous thrombosis, prior     heart attack, prior cerebrovascular accident.   SOCIAL HISTORY:  He lives in [place] with his wife.  He is self employed  as an Architect.  He does not smoke.  He does not use alcoholic beverages,  and no recreational drugs.  He has an active left style.  He currently walks  daily and uses the treadmill.   FAMILY HISTORY:  Mother died of age 56 with complications of gallbladder  surgery.  Father died at age 14 of his second myocardial infarction.  His  first myocardial infarction was at age 56.  He has two brothers.  One  brother at age 17 has had some cardiac workup, but no interventions.  Two  sisters are alive and well with no coronary disease and no diabetes.   REVIEW  OF SYSTEMS:  The patient is not have no, or in the preceding two to  three weeks, fevers, chills, sweats, weight gain or loss of uncontrollable  nature.  HEENT:  No epistaxis, no voice changes, no vertigo.  INTEGUMENT:  No non healing lesions or ulcers.  No skin rashes.  CARDIOPULMONARY:  Chest  pain this morning but of a type and style differently from that which caused  presentation in the past for coronary artery disease.  It is a bloating,  almost gastric type discomfort rather than the exertional angina that he has  previously experienced.  UROGENITAL:  The patient is up at night urinating  every two or three hours.  The patient sleeps erratically, maybe two to  three hours the beginning of the night, and then he will be up two to three  hours, and then he catches two to three more hours of sleep.  MUSCULOSKELETAL:  No arthralgias, joint swelling or deformity.  GI:  No  complaint of chronic heartburn.  No history of melena or hematemesis.  ENDOCRINE SYSTEM:  Intact with regard to diabetes and thyroid disease.   PHYSICAL EXAMINATION:  VITAL SIGNS:  Temperature 98.0, pulse 66 and regular,  respirations 18, blood pressure 115/65.  Oxygen saturation is 97% on room  air.  GENERAL:  He is alert and oriented x3, in no acute distress at present.  HEENT:  Eyes:  Pupils equal, round, reactive to light.  Extraocular  movements are intact.  Sclerae are clear.  Mucous membranes are pink, moist  without lesion or erythema. The patient does not wear dentures.  NECK:  Supple, no jugular venous distention, no carotid bruits.  No cervical  lymphadenopathy.  HEART:  Regular rate and rhythm, S2, S1 auscultated.  LUNGS:  Clear to auscultation and percussion.  No rales or rhonchi.  INTEGUMENT:  No rashes or lesions.  ABDOMEN:  Soft, nondistended.  No hepatosplenomegaly.  Abdominal aorta is  nonpulsatile, non expansile.  There is no rebound or guarding.  UROGENITAL/RECTAL:  Exams deferred. EXTREMITIES:   There are 4/4  pulses in the radial arteries bilateral, 4/4  pulses dorsalis pedis arteries bilaterally, no clubbing, cyanosis or edema.  No joint deformities.  Pain did radiate through to the back but has abated.  NEUROLOGIC:  No neurologic deficits.   Chest x-ray, no acute disease.  Heart size normal.  Aorta and mediastinum  are not widened.  Electrocardiogram rate of 66, normal regular sinus rhythm.  Axis is 0, PR interval is 146, QRS is 92, QTC 423.   LABORATORY DATA:  Laboratory studies:  Complete blood count, white cells  5.8, 14.5 hemoglobin, 41.5 hematocrit, 278,000 for platelets.  Serum  electrolytes:  Sodium 139, potassium 4.2, chloride 105, carbonate 26, BUN  17, creatinine 1.1.  Glucose 123.  Alkaline phosphatase 53.  SGOT 23.  SGPT  24.  PTT 32.  PT 12.7.  INR of 0.9.  His first set of cardiac enzymes came  back negative with I less than 0.08.   ASSESSMENT:  1. Admitted with atypical chest pain.  2. History of coronary artery disease.  3. Family history of coronary artery disease.  4. Dyslipidemia.   PLAN:  1. Formulated by Dr. Clovis Riley after he saw the patient, examined the patient,     and assessed the findings.  He states that the sensation in a 65 year old     gentleman with a history of coronary artery disease status post multiple     PCI'.  He now presents with chest pain which was different from his prior     cardiac pain.  2. Our plan is to admit the patient, treat him with aspirin and heparin,     Protonix and increasing beta-blocker.  We will rule out myocardial     infarction by cycling cardiac enzymes.  If these are negative, he will     have a Cardiolite stress test at [facility] on 08/05/2003      Maple Mirza, P.A.                    Salvadore Farber, M.D. [facility]    GM/MEDQ  D:  08/04/2003  T:  08/04/2003  Job:  [id number]

## 2010-07-14 NOTE — Discharge Summary (Signed)
NAMEWEI, POPLASKI                         ACCOUNT NO.:  [id number]   MEDICAL RECORD NO.:  [id number]                   PATIENT TYPE:  INP   LOCATION:  2001                                 FACILITY:  [facility]   PHYSICIAN:  Salvadore Farber, M.D. [facility]         DATE OF BIRTH:  1945/10/20   DATE OF ADMISSION:  08/04/2003  DATE OF DISCHARGE:  08/05/2003                                 DISCHARGE SUMMARY   PROCEDURES:  None.   HOSPITAL COURSE:  Mr. Schack is a 65 year old male with a history of  coronary artery disease, whose last intervention was in 2002, and was a  cutting balloon intervention of the proximal to mid RCA.  He had done well  until, on the day of admission at breakfast, he felt chest pain that  radiated up into his neck.  He took Gaviscon and aspirin with little relief,  and then he called EMS.  He received aspirin, nitroglycerin, and had some  relief.  He was admitted for further evaluation and treatment.  His enzymes  were negative for MI.  His EKG had no acute changes.  Dr. Juanda Chance evaluated  him and did not feel like the chest pain was clearly ischemic.  He felt that  Mr. Ertl could be discharged home with an outpatient Cardiolite and  followup arranged.  Mr. Wilensky was considered stable for discharge on August 05, 2003.   LABORATORY VALUES:  Hemoglobin 14.3, hematocrit 41.8, WBCs 8.9, platelets  283.  Sodium 139, potassium 4.2, chloride 105, CO2 26, BUN 17, creatinine  1.1, glucose 123 - non-fasting.  Other C-MET values within normal limits.  Magnesium 2.0.  Calcium 9.3.  Lipid profile is pending at the time of  dictation.   Chest x-ray:  No acute disease.   DISCHARGE CONDITION:  Improved.   DISCHARGE DIAGNOSES:  1. Chest pain, possibly anginal in origin but enzymes negative for     myocardial infarction.  Outpatient Cardiolite for evaluation.  2. Status post stents to the distal circumflex and mid right coronary artery     in 1995.  3. Status post cardiac  catheterization in 2002, with a 95% stenosis in the     right coronary artery, treated with cutting balloon angioplasty.  4. Family history of coronary artery disease.  5. Dyslipidemia.   DISCHARGE INSTRUCTIONS:  1. His activity level is to include no strenuous activity till after the     stress test.  2. He is to stick to a low-fat diet.  3. He is to get a stress test done in the office on June 16 which is     Thursday, at 12:30 p.m.  4. He is to keep his appointment with Dr. Debby Bud and see the P.A. for Dr.     Eden Emms, on August 23, 2003 at 11 a.m.   DISCHARGE MEDICATIONS:  1. Imdur 30 mg every day.  2. Nitroglycerin sublingual p.r.n.  3. Lisinopril  10 mg every day.  4. Aspirin 81 mg every day.  5. Toprol XL 25 mg every day.  6. Lipitor 40 mg every day.  7. Multivitamin every day.   He is to hold the Imdur and the Toprol the day of the stress test.      Theodore Demark, P.A. [facility]                  Salvadore Farber, M.D. [facility]    RB/MEDQ  D:  08/05/2003  T:  08/06/2003  Job:  [id number]   cc:   Rosalyn Gess. Norins, M.D. [facility]   Charlton Haws, M.D.

## 2010-07-14 NOTE — Discharge Summary (Signed)
[place]  Patient:    Clifford [name], Clifford [name]                      MRN: [id number] Proc. Date: 07/26/00 Adm. Date:  [id number] Disc. Date: 07/30/00 Attending:  Colon Branch Dictator:   Tereso Newcomer, P.A.-C.n CC:         Rosalyn Gess. Norins, M.D. [facility]   Discharge Summary  DATE OF BIRTH:  [birth year]  DISCHARGE DIAGNOSES: 1. Coronary artery disease. 2. Status post stent to proximal/mid right coronary artery this admission. 3. History of myocardial infarction, April of 2000, treated with percutaneous    transluminal coronary angioplasty to the distal circumflex and    posterolateral. 4. History of percutaneous transluminal coronary angioplasty to the right    coronary artery in 1995. 5. Hyperlipidemia. 6. Tobacco abuse. 7. Positive family history for coronary artery disease.  PROCEDURES PERFORMED THIS ADMISSION: 1. Cardiac catheterization by Dr. Charlton Haws, revealing left main 30%, LAD    30%, proximal 60% diffuse distal, diagonal #1 30%, circumflex 30%,    proximal/mid, distal stent widely patent, AV groove 40%, RCA 95% mid lesion    at the proximal edge of the stent.  LV hyperdynamic. EF 65%. 2. Status post stenting of the RCA by Dr. Juanda Chance on July 29, 2000.  HOSPITAL COURSE:  The patient is a 65 year old male, who presented to the emergency room with complaints of chest burning at rest with associated radiation to his left arm and tingling in his right and left hand.  He had originally been scheduled for an outpatient cardiac catheterization on August 08, 2000, for an abnormal Cardiolite.  He denied any shortness of breath, palpitations, diaphoresis, or nausea.  He had no relief at home with his usual medications.  He eventually had relief in the emergency room. Initially, he had a blood pressure of 170/85, pulse 60.  Cardiac:  Regular rate and rhythm.  No bruit.  Neck:  No bruits.  No JVD.  Lungs:  Clear to auscultation bilaterally.   Abdomen:  Soft, nontender.  Extremities without edema.  ECG:  Sinus bradycardia with a heart rate of 51.  Chest x-ray:  No acute cardiopulmonary disease.  The patient was admitted for unstable angina. He was continued on aspirin, beta blocker, placed on nitroglycerin, heparin and Integrilin.  He remained stable throughout the weekend without throat or chest pain.  On the morning of July 29, 2000, he went for cardiac catheterization by Dr. Charlton Haws and initial percutaneous coronary intervention by Dr. Charlies Constable.  The results are noted above.  He tolerated the procedure well and had no immediate complications.  On the morning of July 30, 2000, he was found to be in stable condition and ready for discharge to home.  LABORATORY DATA:  Hemoglobin 12.4, hematocrit 35.4, white blood cell count 7200, platelet count 260,000.  Sodium 142, potassium 4.0, chloride 108, CO2 28, BUN 11, creatinine 1.2, glucose 103.  Postprocedure CK 63, MB 0.5.  INR 1.1.  Cardiac enzymes prior to procedure negative x3.  Calcium 9.0.  Total protein 6.9, albumin 3.9.  AST 24, ALT 21, alkaline phosphatase 54, total bilirubin 1.2.  Total cholesterol 162, triglycerides 118.  ACL 46, LDL 92.  DISCHARGE MEDICATIONS: 1. Plavix 75 mg q.d. for 1 month. 2. Toprol XL 50 mg q.d. 3. Coated aspirin 325 mg q.d. 4. Nitroglycerin as needed for chest pain.  ACTIVITY:  No driving, heavy lifting, exertion or sex  for 3 days.  DIET:  Low fat, low-sodium diet.  SPECIAL INSTRUCTIONS:  He should call our office for any groin swelling, bleeding, or bruising and he will follow up with Dr. Ricki Miller physician assistant in 2 weeks and Dr. Eden Emms in 6. DD:  07/30/00 TD:  07/30/00 Job: [id number]

## 2010-07-14 NOTE — Assessment & Plan Note (Signed)
[facility]                             PRIMARY CARE OFFICE NOTE   NAME:Lal, KEAUNDRE THELIN                      MRN:          [id number]  DATE:12/19/2005                            DOB:          Jan 16, 1946    HISTORY OF PRESENT ILLNESS:  Mr. Wenig is a very pleasant 65 year old  Caucasian male, very well known to the practice, called for multiple medical  problems.  He was last seen in primary care on September 18, 2004.   INTERVAL HISTORY:  GU:  The patient had a rise in his PSA from 2.3 to 3.5  which led to Urology referral leading to biopsy which was positive with a  Gleason score of 8, leading to a robotic assisted laparoscopy radical  prostatectomy by Dr. Laverle Patter performed April 18, 2005.  The patient did  well, but did have to wear a catheter for a month.  He currently is doing  well with no incontinence.  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  This is being  addressed by Dr. Laverle Patter.  The patient is not a candidate for Viagra or like  drugs.  He is going to try a vacuum assisted device.   Otherwise, the patient is doing well with no significant changes in his  medical history.   PAST SURGICAL HISTORY:  1. Excisional lipoma.  2. Incision and drainage of thrombosed hemorrhoid in 1995.  3. Multiple percutaneous coronary interventions.  4. Robotic assisted laparoscopic prostatectomy.   PAST MEDICAL HISTORY:  1. Usual childhood disease.  2. Hyperlipidemia.  3. CAD.  4. History of hematuria.  5. Prostate cancer.   FAMILY HISTORY:  Noncontributory.   SOCIAL HISTORY:  The patient is retired from [employer].  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  He remains very active in his church.  His marriage is  in good health.   CURRENT MEDICATIONS:  1. Multivitamin daily.  2. Aspirin 81 mg daily.  3. Vytorin 10/40 daily.  4. Lisinopril 10 mg daily.   REVIEW OF SYSTEMS:  Negative in Constitutional problems.  He has had an eye  exam in the last 12 months  and was told he had early cataracts.  No ENT,  cardiovascular, respiratory or GI problems.  GU:  Per the HPI.  No  Musculoskeletal, Dermatologic or Neurologic problems.   PHYSICAL EXAMINATION:  VITAL SIGNS:  Temperature 97.6, blood pressure  139/81, pulse 77, weight 172, height 5 feet 6 inches.  GENERAL APPEARANCE:  This is a well-nourished, somewhat stocky Caucasian  male in no acute distress.  HEENT:  Normocephalic, atraumatic.  EAC's and TM's normal.  Oropharynx with  native dentition in good repair.  No buccal or pallor lesions were noted.  Posterior pharynx was clear.  The patient was noted to have a scar  underneath his lower lip.  Conjunctivae and sclerae were clear.  PERRLA.  EOMI.  Funduscopic exam was unremarkable.  NECK:  Supple without thyromegaly.  No adenopathy was noted in the cervical  or supraclavicular regions.  CHEST:  No CVA tenderness.  LUNGS:  Clear to auscultation and percussion.  CARDIOVASCULAR:  Radial pulses 2+.  No JVD.  No carotid bruits.  He had  quiet precordium with regular rate and rhythm without murmurs, rubs or  gallops.  ABDOMEN:  The patient has well healed laparoscopic surgical scars on ventral  aspect with the larger scar just at the umbilicus for a camera.  The patient  had positive bowel sounds in all four quadrants.  No organosplenomegaly.  No  guarding, rebound or tenderness.  GENITALIA/RECTAL:  Deferred to Urology.  EXTREMITIES:  Without cyanosis, clubbing or edema.  No deformities were  noted.   DATABASE ON ADMISSION:  Hemoglobin 15.5 g, white count 6100 with normal  differential.  Cholesterol 172, triglycerides 136, HDL 44.1, LDL 101.  Chemistries were unremarkable.  Serum glucose 107.  Kidney function and  liver functions were normal.  TSH was normal at 1.28.  PSA was 0.01.  Urinalysis with 2-3 RBC's per high-powered field.   CHART REVIEW:  Last visit with Dr. Eden Emms was October 08, 2005, where the  patient was thought to be stable.   Last colonoscopy was October 14, 2001,  with follow up in 2008.  Last stress Cardiolite study was October 08, 2005,  read out as a negative study with no signs of active ischemia.  Last chest x-  ray July 27, 2000, although, I am sure he had one with his surgery.   ASSESSMENT/PLAN:  1. Cardiovascular.  The patient is very stable at this time, doing well.      No chest pain or chest discomfort.  He will continue on aspirin and      follow up with Dr. Eden Emms as instructed.  2. Hypertension.  The patient's blood pressure is adequately controlled on      Lisinopril.  He will continue the same.  3. Lipids.  The patient is not at goal which be and LDL of less than 80.      Plan is for the patient is to change to Vytorin 10/80 with follow up      lab in one month.  Will continue to adjust medications to attain goal.  4. GU.  The patient is status post laparoscopic prostatectomy, doing well.      He will follow with Dr. Laverle Patter as instructed.  He is also to continue      to evaluate and work on ED.  5. Health maintenance.  The patient is current and up to date.   SUMMARY:  A very delightful gentleman who is medically doing very well,  although, he had a very active year as noted.  I have asked him to return to  see me in one year on a p.r.n. basis.            ______________________________  Rosalyn Gess Norins, MD      MEN/MedQ  DD:  12/19/2005  DT:  12/20/2005  Job #:  [id number]   cc:   [name]. Eden Emms, MD, [facility]  Willette Cluster, MD  Frank[facility] Monte

## 2010-07-14 NOTE — H&P (Signed)
NAMEGUADALUPE, NICKLESS               ACCOUNT NO.:  [id number]   MEDICAL RECORD NO.:  [id number]          PATIENT TYPE:  INP   LOCATION:  0001                         FACILITY:  [facility]   PHYSICIAN:  Heloise Purpura, MD      DATE OF BIRTH:  06-26-1945   DATE OF ADMISSION:  04/18/2005  DATE OF DISCHARGE:                                HISTORY & PHYSICAL   CHIEF COMPLAINT:  Prostate cancer.   HISTORY:  Mr. Manfredo is a 65 year old gentleman with recently diagnosed  clinical stage T1c prostate cancer with a PSA of 3.73 and a Gleason score of  3 + 3 = 6. After undergoing a discussion regarding management options for  his prostate cancer and becoming thoroughly educated and informed about his  situation, the patient elected to proceed with surgical therapy.   PAST MEDICAL HISTORY:  Coronary artery disease status post angioplasty in  1995, 2000, and 2002. He underwent a cardiac catheterization July 2005 that  did not demonstrate any worrisome findings. He has also been seen and  evaluated by his cardiologist recently and he was felt to be at low risk for  general anesthesia.   PAST SURGICAL HISTORY:  Removal of ganglion cyst.   MEDICATIONS:  1.  Aspirin.  2.  Multivitamin.  3.  Vytorin.  4.  Toprol-XL.   ALLERGIES:  No known drug allergies.   FAMILY HISTORY:  No prostate cancer. Positive for coronary artery disease  and hypertension.   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 He smoked one pack of cigarettes per day  for 15 years and quit in 1985. He is married. He denies alcohol use.   PHYSICAL EXAMINATION:  CONSTITUTIONAL:  The patient is an well-nourished and  well-developed age-appropriate male in no acute distress.  CARDIOVASCULAR:  Regular rate and rhythm.  LUNGS:  Clear bilaterally.  LYMPH NODE SURVEY:  Negative.  ABDOMEN:  Soft and nondistended.  DIGITAL RECTAL EXAM:  No nodularity or induration.  EXTREMITIES:  No edema.   IMPRESSION:  Clinically localized adenocarcinoma of the prostate.   PLAN:  Mr. Romig will undergo a robotic-assisted laparoscopic radical  prostatectomy and then will be admitted to the hospital for routine  postoperative care.           ______________________________  Heloise Purpura, MD  Electronically Signed     LB/MEDQ  D:  04/18/2005  T:  04/18/2005  Job:  [id number]

## 2010-07-14 NOTE — Op Note (Signed)
NAMEMICHALE, [name]               ACCOUNT NO.:  [id number]   MEDICAL RECORD NO.:  [id number]          PATIENT TYPE:  INP   LOCATION:  1424                         FACILITY:  [facility]   PHYSICIAN:  Heloise Purpura, MD      DATE OF BIRTH:  1945/07/09   DATE OF PROCEDURE:  04/18/2005  DATE OF DISCHARGE:                                 OPERATIVE REPORT   PREOPERATIVE DIAGNOSIS:  Clinically localized adenocarcinoma of the  prostate.   POSTOPERATIVE DIAGNOSIS:  Clinically localized adenocarcinoma of the  prostate.   PROCEDURE:  Robotic assisted laparoscopic radical prostatectomy (bilateral  nerve sparing).   SURGEON:  Dr. Heloise Purpura.   ASSISTANT:  Dr. Glade Nurse.   ANESTHESIA:  General.   COMPLICATIONS:  None.   ESTIMATED BLOOD LOSS:  200 mL.   INTRAVENOUS FLUIDS:  2100 mL of lactated Ringer's.   SPECIMEN:  Prostate and seminal vesicles.   DRAIN:  1.  18-French coude' catheter.  2.  #19 Blake pelvic drain.   INDICATION:  Clifford [name] is a 65 year old gentleman who was recently  diagnosed with clinical stage T1C prostate cancer with a PSA of 3.73 and a  Gleason score 3+3=6. Preoperative IPSS score was 1 with an IIEF score of 2 .  After discussing management options for clinically localized prostate  cancer, the patient elected to proceed with the above procedure. The  potential risks and benefits of this procedure were discussed with the  patient and he consented.   DESCRIPTION OF PROCEDURE:  The patient was taken to the operating room and a  general anesthetic was administered. He was given preoperative antibiotics,  placed in the dorsal lithotomy position, prepped and draped in the usual  sterile fashion. Next a preoperative time-out was performed. A Foley  catheter was then inserted into the bladder. A site was then selected 18 cm  from the pubic symphysis and just to the left of the umbilicus for placement  of the camera port. This was placed using a standard Hasson  technique. This  allowed entry into the peritoneal cavity under direct vision. A 12 mm port  was then placed and a pneumoperitoneum was established. With the zero degree  lens, the abdomen was inspected and there were was no evidence of any intra-  abdominal injuries or other abnormalities. Attention then turned to  placement of the remaining ports. Bilateral 8 mm ports were placed 16 cm  from the pubic symphysis and 10 cm lateral to the camera port. An additional  8 mm robotic port was placed in the far left lateral abdominal wall. A 5 mm  port was placed between the camera port and the right robotic port. An  additional 12 mm port was placed in the far right lateral abdominal wall for  laparoscopic assistance. All ports were placed under direct vision and  without difficulty. The surgical cart was then docked. With the aid of the  hook cautery, the bladder was reflected posteriorly allowing entry into the  space of Retzius and identification of the endopelvic fascia and prostate.  The endopelvic fascia was then incised from  the apex back to the base of the  prostate and the underlying levator muscle fibers were swept laterally off  of the prostate. This exposed the dorsal venous complex which was divided  and stapled with the 45 mm flex ETS stapler. The bladder neck was then  identified with the aid of Foley catheter manipulation and divided  anteriorly with the hook cautery. The Foley catheter was then identified and  the Foley catheter balloon was deflated. The catheter was then brought into  the operative field to retract the prostate anteriorly. The posterior  bladder neck was then divided and dissection continued posteriorly until the  vasa deferentia and seminal vesicles were identified. Each of these  structures was identified and dissected free. The vasa deferentia were  divided and the seminal vesicles were retracted anteriorly. The space  between Denonvilliers fascia and the  anterior rectum was then bluntly  developed. This isolated the vascular pedicles of the prostate. Attention  then turned to the anterior aspect of the prostate. The lateral prostatic  fascia was incised bilaterally allowing the neurovascular bundles to be  swept laterally and posteriorly off of the prostate. The vascular pedicles  of the prostate were then ligated with Hem-o-Lok clips and sharply divided.  Attention then turned to the urethra. This was sharply divided and the  prostate specimen was able to be disarticulated and placed up into the  abdomen. The pelvis was then copiously irrigated. With irrigation in the  pelvis, air was injected into the rectal catheter and there was no evidence  of a rectal injury. Attention then turned to the urethral anastomosis. A 2-0  Vicryl slip-knot was placed at the 6 o'clock position between the bladder  neck and urethra and used to reapproximate these structures. A double-armed  2-0 Monocryl suture was then used to performed a 360 degree running  anastomosis between the bladder neck and urethra. During the beginning of  this anastomosis, the initial Vicryl slip-knot did pull out of the bladder  neck. However, the anastomosis was able to be completed with tension pulled  on either side of the anastomotic sutures. The 18-French coude' catheter was  then attempted to be passed into the bladder and irrigated. However, the  catheter was seen to be going posteriorly outside of the bladder neck and  was not able to be redirectioned into the bladder. Therefore the anastomotic  sutures were cut out and the anastomosis was performed again. A 2-0 Vicryl  slip-knot again was placed at the 6 o'clock position between the bladder  neck and urethra and used to reapproximate these structures. This time a 2-0  double-arm Monocryl suture was used to perform a 360 degree running tension- free anastomosis between the bladder neck and urethra. A new 18-French  coude'  catheter was then inserted into the bladder and was placed into the  bladder without difficulty. It was irrigated and the anastomosis appeared  watertight and there did not appear to be any blood clots within the urethra  or bladder. A #19 Blake drain was then brought into the left robotic port  and appropriately positioned in the pelvis. It was secured to the skin with  a nylon suture. The surgical cart was then undocked. The prostate specimen  was placed into the Endopouch retrieval bag via the periumbilical port for  later removal. A #0 Vicryl suture was then used to close the right lateral  12 mm port site with the aid of the suture passer. All remaining ports were  then removed under direct vision. The periumbilical port site was then  slightly extended inferiorly allowing the prostate specimen to be removed  intact within the Endopouch retrieval bag. This fascial opening was then  closed with a running #0 Vicryl suture. All ports were then injected with  0.25% Marcaine and closed at the skin level with staples. Sterile dressings  were applied. The patient appeared to tolerate the procedure well and  without complications. He was able to be extubated and transferred to the  recovery unit in satisfactory condition.           ______________________________  Heloise Purpura, MD  Electronically Signed     LB/MEDQ  D:  04/18/2005  T:  04/19/2005  Job:  [id number]

## 2010-07-14 NOTE — Cardiovascular Report (Signed)
[place]  Patient:    Clifford [name], Clifford [name]                      MRN: [id number] Proc. Date: 07/29/00 Adm. Date:  [id number] Attending:  Colon Branch CC:         Rosalyn Gess. Norins, M.D. [facility]  Peter C. Eden Emms, M.D. [facility]  Cath Lab   Cardiac Catheterization  INDICATIONS:  [name] is 65 years old and has documented coronary artery disease with remote angioplasty of the right coronary artery and tandem overlying stents in the circumflex in 2000 by Dr. Gerri Spore.  He recently developed recurrent angina and had a Cardiolite scan which showed inferior ischemia and was scheduled to come in for catheterization.  Because of recurrent chest pain he was brought in sooner.  The diagnostic study was performed earlier today by Dr. Eden Emms and showed a tight restenosis in the proximal to mid right coronary artery estimated at 95%.  There was no major obstruction in the tandem overlying stents in the circumflex system and there was moderate disease in the LAD.  Overall good LV function.  DESCRIPTION OF PROCEDURE:  The procedure was performed via the right femoral artery using arterial sheath and a 7 [place] JR4 guiding catheter with sideholes.  The patient was given weight-adjusted heparin upon ACT greater than 200 seconds and was on an Integrilin drip.  We were able to cross the lesion in the right coronary artery with the Luge wire without difficulty.  We direct stented with a 2.5 x 18 mm Nir deploying this with one inflation of 12 atmospheres for 45 seconds.  We then postdilated with a 3.0 x 15 mm Quantum Ranger avoiding the distal edge and covering the proximal edge with one inflation of 14 atmospheres for 42 seconds.  Repeat diagnostic studies were then performed through the guiding catheter.  The patient tolerated the procedure well and left the laboratory in satisfactory condition.  RESULTS:  Initially the stenosis in the proximal to mid right  coronary artery was estimated at 95%.  Following stenting, this improved to 0%.  There was residual mild disease in the ostium of the proximal right coronary artery.  CONCLUSION:  Successful stenting of the proximal to mid right coronary artery with improvement in stent diameter narrowing from 95% to 0%.  DISPOSITION:  The patient was taken to the recovery room for further observation. DD:  07/29/00 TD:  07/29/00 Job: [id number]

## 2010-07-14 NOTE — Discharge Summary (Signed)
NAMEGOLDEN, Clifford [name]               ACCOUNT NO.:  [id number]   MEDICAL RECORD NO.:  [id number]          PATIENT TYPE:  INP   LOCATION:  1424                         FACILITY:  [facility]   PHYSICIAN:  Heloise Purpura, MD      DATE OF BIRTH:  05-19-1945   DATE OF ADMISSION:  04/18/2005  DATE OF DISCHARGE:  04/20/2005                                 DISCHARGE SUMMARY   ADMISSION DIAGNOSIS:  Clinically localized adenocarcinoma of the prostate.   DISCHARGE DIAGNOSIS:  Clinically localized adenocarcinoma of the prostate.   PROCEDURES:  Robotic assisted laparoscopic radical prostatectomy.   HISTORY:  For full details, please see admission history and physical.  Briefly, Clifford [name] is a 65 year old gentleman recently diagnosed with  clinical stage T1C prostate cancer with a PSA of 3.73 and Gleason score of  3+3=6. After a discussion regarding all management options for clinically  localized prostate cancer, the patient elected to proceed with surgical  therapy.   HOSPITAL COURSE:  Clifford [name] was admitted to the hospital on April 18, 2005 and underwent an uneventful robotic assisted laparoscopic radical  prostatectomy. Postoperatively, he was able to be transferred to a regular  hospital room following recovery from anesthesia. Over the course of  postoperative day #1, he was able to begin ambulating and also to begin a  clear liquid diet. He initially did quite well. However, on the evening of  postoperative day #1, he was noted to have a distended abdomen associated  with nausea and diffuse abdominal discomfort. Therefore, it was decided not  to discharge him at that time and he was kept overnight for observation. By  postoperative day #2, he had passed flatus and was feeling much improved.  His JP drain had minimal output by postoperative day #2 and it was able to  be removed. He was then able to be discharged home in excellent condition.   DISPOSITION:  Home.   DISCHARGE MEDICATIONS:   The patient was instructed to resume his regular  home medications except for any aspirin, nonsteroidal anti-inflammatory  drugs, or herbal medications for 10 days. He was instructed to take Colace  as a stool softener. He was also given a prescription for Vicodin to take as  needed for pain. In addition, he was given a prescription for Cipro to begin  1 day prior to his return visit for Foley catheter removal.   DISCHARGE INSTRUCTIONS:  The patient was instructed on the signs and  symptoms of wound infection and told to call should he have any problems. He  was also instructed on routine Foley catheter care and given a leg bag for  daytime usage. He was encouraged to be ambulatory following discharge but to  refrain from any heavy lifting or strenuous activity. He was also instructed  to gradually advance his diet as tolerated upon discharge.   FOLLOW-UP VISITS:  Clifford [name] will follow-up in approximately 7-10 days for  Foley catheter removal, staple removal, and to discuss his surgical  pathology in detail.           ______________________________  Heloise Purpura, MD  Electronically Signed     LB/MEDQ  D:  04/20/2005  T:  04/20/2005  Job:  [id number]   cc:   Rosalyn Gess. Norins, M.D. [facility]  [address]  [place]  [place] [address]

## 2010-07-14 NOTE — Cardiovascular Report (Signed)
[place]. [facility]  Patient:    Clifford [name], Clifford [name]                      MRN: [id number] Proc. Date: 07/29/00 Adm. Date:  [id number] Attending:  Colon Branch                        Cardiac Catheterization  PROCEDURE PERFORMED:  Coronary arteriography.  INDICATIONS:  Unstable angina with positive Cardiolite study.  The patient has had two previous stents placed to the distal circumflex and RCA in 1995.  The circumflex was worked on again in April of 2000.  Left main coronary artery had a 30% discrete stenosis.  Left anterior descending artery had 30% multiple discrete lesions proximally. There was 60% diffuse disease distally.  The first diagonal branch had a 30% ostial lesion.  Circumflex coronary artery was a medium sized vessel.  There was 30% multiple discrete lesions in the proximal and midportion.  There was a stent in the distal aspect of the mid vessel, which was widely patent.  The distal AV groove branch had 40-50% multiple discrete lesions.  The right coronary artery was a small vessel.  It was dominant.  There was 30% tubular disease proximally.  There was a 95% discrete lesion just at the proximal edge of the mid vessel stent.  The distal vessel had 30% multiple discrete lesions.  The PDA had 40% multiple discrete lesions.  RIGHT ANTERIOR OBLIQUE VENTRICULOGRAPHY:  The RAO ventriculography revealed hyperdynamic LV function.  Ejection fraction was 60%.  There were no wall motion abnormalities.  LV pressure was 119/16.  Aortic pressure was 118/69.  IMPRESSION:  The patient will be referred for percutaneous transluminal coronary angioplasty and most likely Cutting Balloon technology for the right coronary artery.  This will be done by Dr. Juanda Chance within the next hour or two. D:  07/29/00 TD:  07/29/00 Job: [id number]

## 2010-07-14 NOTE — Assessment & Plan Note (Signed)
[facility]                              CARDIOLOGY OFFICE NOTE   NAME:Mccorkel, JAMONTE CURFMAN                      MRN:          [id number]  DATE:10/08/2005                            DOB:          1946-02-17    [name] returns to the office today for followup.  He is doing fairly  well.  His prostate cancer is now cured.  His blood pressure continues to be  labile.  We have settled on the dose of 10 mg of Lisinopril a day which  seems  to control  his blood pressure, not make him feel like a zombie.  He has previous interventions to the right coronary artery and circ.  His  last Myoview was in 2005.  There is a question of inferior wall ischemia but  when we cathed the stents in the right and circ were patent.  His Myoview  today was normal with a very small inferobasal wall infarction and EF of  67%.  There was no ischemia.  He is asymptomatic.  His risk factors were  well modified.   PHYSICAL EXAMINATION:  VITAL SIGNS:  Blood pressure 140/70, pulse is 70 and  regular.  LUNGS: Clear.  NECK:  Carotids are normal.  HEART:  S1 and S2 with a  systolic ejection murmur.  ABDOMEN:  Benign.  LOWER EXTREMITIES:  No edema.   IMPRESSION:  Stable two vessel coronary disease, patent stents by  catheterization in 2005, nonischemic Myoview today.  Continue current  medications for his blood pressure which includes Lisinopril 10 mg a day.  Continue on aspirin a day.  I will see him back in a year.                               [name]. Eden Emms, MD, [facility]    PCN/MedQ  DD:  10/08/2005  DT:  10/08/2005  Job #:  [id number]

## 2010-10-03 ENCOUNTER — Telehealth: Payer: Self-pay | Admitting: *Deleted

## 2010-10-03 DIAGNOSIS — E7800 Pure hypercholesterolemia, unspecified: Secondary | ICD-10-CM

## 2010-10-03 DIAGNOSIS — I10 Essential (primary) hypertension: Secondary | ICD-10-CM

## 2010-10-03 DIAGNOSIS — Z0000 Encounter for general adult medical examination without abnormal findings: Secondary | ICD-10-CM

## 2010-10-03 DIAGNOSIS — I2510 Atherosclerotic heart disease of native coronary artery without angina pectoris: Secondary | ICD-10-CM

## 2010-10-03 DIAGNOSIS — E119 Type 2 diabetes mellitus without complications: Secondary | ICD-10-CM

## 2010-10-03 NOTE — Telephone Encounter (Signed)
Patient informed, orders entered

## 2010-10-03 NOTE — Telephone Encounter (Signed)
Routine labs plus A1C

## 2010-10-03 NOTE — Telephone Encounter (Signed)
Patient requesting labs prior to CPX 10/2010. He does not have medicare. What would you like to order?

## 2010-11-04 ENCOUNTER — Other Ambulatory Visit: Payer: Self-pay | Admitting: Internal Medicine

## 2010-11-04 DIAGNOSIS — Z0000 Encounter for general adult medical examination without abnormal findings: Secondary | ICD-10-CM

## 2010-11-04 DIAGNOSIS — Z0389 Encounter for observation for other suspected diseases and conditions ruled out: Secondary | ICD-10-CM

## 2010-11-06 ENCOUNTER — Other Ambulatory Visit: Payer: Self-pay

## 2010-11-13 ENCOUNTER — Encounter: Payer: Self-pay | Admitting: Internal Medicine

## 2010-11-27 ENCOUNTER — Encounter: Payer: Self-pay | Admitting: Internal Medicine

## 2010-11-27 ENCOUNTER — Other Ambulatory Visit (INDEPENDENT_AMBULATORY_CARE_PROVIDER_SITE_OTHER): Payer: 59

## 2010-11-27 DIAGNOSIS — Z0389 Encounter for observation for other suspected diseases and conditions ruled out: Secondary | ICD-10-CM

## 2010-11-27 DIAGNOSIS — Z0000 Encounter for general adult medical examination without abnormal findings: Secondary | ICD-10-CM

## 2010-11-27 LAB — CBC WITH DIFFERENTIAL/PLATELET
Basophils Relative: 0.3 % (ref 0.0–3.0)
Eosinophils Relative: 1.2 % (ref 0.0–5.0)
Hemoglobin: 16.5 g/dL (ref 13.0–17.0)
MCHC: 33.8 g/dL (ref 30.0–36.0)
MCV: 96.4 fl (ref 78.0–100.0)
Monocytes Absolute: 0.7 10*3/uL (ref 0.1–1.0)
Neutro Abs: 4.3 10*3/uL (ref 1.4–7.7)
Neutrophils Relative %: 54.1 % (ref 43.0–77.0)
Platelets: 270 10*3/uL (ref 150.0–400.0)
RBC: 5.05 Mil/uL (ref 4.22–5.81)
RDW: 12.8 % (ref 11.5–14.6)

## 2010-11-27 LAB — COMPREHENSIVE METABOLIC PANEL
ALT: 40 U/L (ref 0–53)
Alkaline Phosphatase: 52 U/L (ref 39–117)
CO2: 26 mEq/L (ref 19–32)
Chloride: 101 mEq/L (ref 96–112)
GFR: 66.66 mL/min (ref >60.00)
Potassium: 4.5 mEq/L (ref 3.5–5.1)

## 2010-11-27 LAB — URINALYSIS, ROUTINE W REFLEX MICROSCOPIC
Ketones, ur: NEGATIVE
Leukocytes, UA: NEGATIVE
Specific Gravity, Urine: 1.01 (ref 1.000–1.030)
Total Protein, Urine: NEGATIVE
Urine Glucose: NEGATIVE
Urobilinogen, UA: 0.2 (ref 0.0–1.0)

## 2010-11-27 LAB — LIPID PANEL
Cholesterol: 162 mg/dL (ref 0–200)
LDL Cholesterol: 83 mg/dL (ref 0–99)

## 2010-11-27 LAB — TSH: TSH: 3.55 u[IU]/mL (ref 0.35–5.50)

## 2010-12-08 ENCOUNTER — Other Ambulatory Visit (INDEPENDENT_AMBULATORY_CARE_PROVIDER_SITE_OTHER): Payer: 59

## 2010-12-08 ENCOUNTER — Ambulatory Visit (INDEPENDENT_AMBULATORY_CARE_PROVIDER_SITE_OTHER): Payer: 59 | Admitting: Internal Medicine

## 2010-12-08 VITALS — BP 130/76 | HR 67 | Temp 97.0°F | Wt 173.0 lb

## 2010-12-08 DIAGNOSIS — E119 Type 2 diabetes mellitus without complications: Secondary | ICD-10-CM

## 2010-12-08 DIAGNOSIS — E7800 Pure hypercholesterolemia, unspecified: Secondary | ICD-10-CM

## 2010-12-08 DIAGNOSIS — I10 Essential (primary) hypertension: Secondary | ICD-10-CM

## 2010-12-08 DIAGNOSIS — Z0000 Encounter for general adult medical examination without abnormal findings: Secondary | ICD-10-CM

## 2010-12-08 DIAGNOSIS — Z8546 Personal history of malignant neoplasm of prostate: Secondary | ICD-10-CM

## 2010-12-08 DIAGNOSIS — I2510 Atherosclerotic heart disease of native coronary artery without angina pectoris: Secondary | ICD-10-CM

## 2010-12-11 DIAGNOSIS — Z0000 Encounter for general adult medical examination without abnormal findings: Secondary | ICD-10-CM | POA: Insufficient documentation

## 2010-12-11 NOTE — Assessment & Plan Note (Signed)
Mildly elevated serum glucose but A1C less than 7% and at goal.  Plan - continue life-style management with diet and exercise

## 2010-12-11 NOTE — Assessment & Plan Note (Signed)
BP Readings from Last 3 Encounters:  12/08/10 130/76  12/08/09 142/80  11/03/09 144/78   Adequate control on present medication with the added cardiovascular and renal benefit of ACE-I treatment.  Plan - continue present medication

## 2010-12-11 NOTE — Assessment & Plan Note (Signed)
Interval history is unremarkable. Physical exam is normal. Lab results are good. He is current for colorectal cancer screening and is followed by Urology for h/o prostate cancer. Immunizations: noted - he is due for shingles vaccine and will check with insurance.   In summary - a delightful man who is medically stable. He will continue his healthy life-style. He is asked to return in 1 year or prn.

## 2010-12-11 NOTE — Assessment & Plan Note (Signed)
Stable with no cardiac symptoms. Current with follow-up with Dr. Eden Emms. Good risk modification re: diabetes and lipids  Plan - continue present regimen           See Dr. Eden Emms as scheduled.

## 2010-12-11 NOTE — Progress Notes (Signed)
Subjective:    Patient ID: Clifford [name], male    DOB: 1945-07-11, 66 y.o.   MRN: [id number]  HPI The patient is here for annual Medicare wellness examination and management of other chronic and acute problems. He has been feeling well and doing well. He is current with his cardiologist and has been having no problems.   The risk factors are reflected in the social history.  The roster of all physicians providing medical care to patient - is listed in the Snapshot section of the chart.  Activities of daily living:  The patient is 100% inedpendent in all ADLs: dressing, toileting, feeding as well as independent mobility  Home safety : The patient has smoke detectors in the home. They wear seatbelts. No firearms at home. There is no violence in the home.   There is no risks for hepatitis, STDs or HIV. There is no   history of blood transfusion. They have no travel history to infectious disease endemic areas of the world.  The patient has seen their dentist in the last six month. They have seen their eye doctor in the last year. They deny any hearing difficulty and have not had audiologic testing in the last year.  They do not  have excessive sun exposure. Discussed the need for sun protection: hats, long sleeves and use of sunscreen if there is significant sun exposure.   Diet: the importance of a healthy diet is discussed. They do have a healthy diet.  The patient has a regular exercise program: walking 2 miles per day , 7 days per week.  The benefits of regular aerobic exercise were discussed.  Depression screen: there are no signs or vegative symptoms of depression- irritability, change in appetite, anhedonia, sadness/tearfullness.  Cognitive assessment: the patient manages all their financial and personal affairs and is actively engaged. 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  The following portions of the patient's history were reviewed and updated as appropriate:  allergies, current medications, past family history, past medical history,  past surgical history, past social history  and problem list.  Vision, hearing, body mass index were assessed and reviewed.   During the course of the visit the patient was educated and counseled about appropriate screening and preventive services including : fall prevention , diabetes screening, nutrition counseling, colorectal cancer screening, and recommended immunizations.  Past Medical History  Diagnosis Date  . Hyperlipidemia   . Hypertension   . Diabetes mellitus   . History of detached retina repair 09-11/2006    [facility]  . CAD (coronary artery disease) 2005    Previous stents RCA and circ, Cath 2005 patient with 70% PDA  . History of prostate cancer   . History of hematuria    Past Surgical History  Procedure Date  . Prostatectomy   . Ptca   . Lipoma excision   . Wrist ganglion excision   . Hemorrhoid surgery     I&D of thrombosed hemorrhoid  . Cardiac catheterization 05.31.2002    Initially the stenosis in the proximal to mid right coronary artery was estimated ar 95%. Following stenting, this improved to 0%. There was residual mild disease in the sostium of the proximal right coronary atrtery. Successful stentng of the proximal to mid right coronary artery with improvement in stent diameter narrowing from 95% to 0% Proc Date  07/29/2000   Family History  Problem Relation Age of Onset  . Diabetes Sister   . Cancer Neg Hx  Colon   History   Social History  . Marital Status: Married    Spouse Name: N/A    Number of Children: N/A  . Years of Education: N/A   Occupational History  . Not on file.   Social History Main Topics  . Smoking status: Not on file  . Smokeless tobacco: Not on file  . Alcohol Use:   . Drug Use:   . Sexually Active:    Other Topics Concern  . 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         Review of Systems Constitutional:  Negative for fever, chills, activity change and unexpected weight change.  HEENT:  Negative for hearing loss, ear pain, congestion, neck stiffness and postnasal drip. Negative for sore throat or swallowing problems. Negative for dental complaints.   Eyes: Negative for vision loss or change in visual acuity.  Respiratory: Negative for chest tightness and wheezing. Negative for DOE.   Cardiovascular: Negative for chest pain or palpitations. No decreased exercise tolerance Gastrointestinal: No change in bowel habit. No bloating or gas. No reflux or indigestion Genitourinary: Negative for urgency, frequency, flank pain and difficulty urinating.  Musculoskeletal: Negative for myalgias, back pain, arthralgias and gait problem.  Neurological: Negative for dizziness, tremors, weakness and headaches.  Hematological: Negative for adenopathy.  Psychiatric/Behavioral: Negative for behavioral problems and dysphoric mood.       Objective:   Physical Exam  (exam per Adalberto Ill, MSIV) Vital signs reviewed - good  BP control Gen'l: Well nourished well developed whitemale in no acute distress  HEENT: Head: Normocephalic and atraumatic. Right Ear: External ear normal. EAC w/ cerumen. Left Ear: External ear normal.  EAC w/ cerumenl. Nose: Nose normal. Mouth/Throat: Oropharynx is clear and moist. Dentition - native, in good repair. No buccal or palatal lesions. Posterior pharynx clear. Eyes: Conjunctivae and sclera clear. EOM intact. Pupils are equal, round, and reactive to light. Right eye exhibits no discharge. Left eye exhibits no discharge. Neck: Normal range of motion. Neck supple. No JVD present. No tracheal deviation present. No thyromegaly present.  Cardiovascular: Normal rate, regular rhythm, no gallop, no friction rub, no murmur heard.      Quiet precordium. 2+ radial and DP pulses . No  carotid bruits Pulmonary/Chest: Effort normal. No respiratory distress or increased WOB, no wheezes, no rales. No chest wall deformity or CVAT. Abdominal: Soft. Bowel sounds are normal in all quadrants. He exhibits no distension, no tenderness, no rebound or guarding, No heptosplenomegaly  Genitourinary:  deferred Musculoskeletal: Normal range of motion. He exhibits no edema and no tenderness.       Small and large joints without redness, synovial thickening or deformity. Full range of motion preserved about all small, median and large joints.  Lymphadenopathy:    He has no cervical or supraclavicular adenopathy.  Neurological: He is alert and oriented to person, place, and time. CN II-XII intact. DTRs 2+ and symmetrical biceps, radial and patellar tendons. Cerebellar function normal with no tremor, rigidity, normal gait and station.  Skin: Skin is warm and dry. No rash noted. No erythema.  Psychiatric: He has a normal mood and affect. His behavior is normal. Thought content normal.   Lab Results  Component Value Date   WBC 8.0 11/27/2010   HGB 16.5 11/27/2010   HCT 48.7 11/27/2010   PLT 270.0 11/27/2010  GLUCOSE 125* 11/27/2010   CHOL 162 11/27/2010   TRIG 172.0* 11/27/2010   HDL 44.50 11/27/2010   LDLCALC 83 11/27/2010   ALT 40 11/27/2010   AST 30 11/27/2010   NA 137 11/27/2010   K 4.5 11/27/2010   CL 101 11/27/2010   CREATININE 1.2 11/27/2010   BUN 20 11/27/2010   CO2 26 11/27/2010   TSH 3.55 11/27/2010   PSA 0.00 Repeated and verified X2.* 11/27/2010   HGBA1C 6.8* 12/08/2010           Assessment & Plan:

## 2010-12-11 NOTE — Assessment & Plan Note (Signed)
Followed routinely by Dr. Pamala Hurry. PSA has been undetectable. He is asymptomatic.   Plan - per Dr. Laverle Patter

## 2010-12-11 NOTE — Assessment & Plan Note (Signed)
Excellent control on present medication except for mildly elevated triglycerides which can be addressed with stricter dietary adherence. He has been on vytorin 10/80 for greater than one year and has done well with no myalgias or other adverse effects and may continue present medication.

## 2010-12-14 ENCOUNTER — Encounter: Payer: Self-pay | Admitting: Cardiovascular Disease

## 2010-12-14 ENCOUNTER — Ambulatory Visit (INDEPENDENT_AMBULATORY_CARE_PROVIDER_SITE_OTHER): Payer: 59 | Admitting: Cardiovascular Disease

## 2010-12-14 DIAGNOSIS — E7800 Pure hypercholesterolemia, unspecified: Secondary | ICD-10-CM

## 2010-12-14 DIAGNOSIS — I2510 Atherosclerotic heart disease of native coronary artery without angina pectoris: Secondary | ICD-10-CM

## 2010-12-14 DIAGNOSIS — I10 Essential (primary) hypertension: Secondary | ICD-10-CM

## 2010-12-14 DIAGNOSIS — E119 Type 2 diabetes mellitus without complications: Secondary | ICD-10-CM

## 2010-12-14 NOTE — Assessment & Plan Note (Signed)
Cholesterol is at goal.  Continue current dose of statin and diet Rx.  No myalgias or side effects.  F/U  LFT's in 6 months. Lab Results  Component Value Date   LDLCALC 83 11/27/2010

## 2010-12-14 NOTE — Assessment & Plan Note (Signed)
Stable with no angina and good activity level.  Continue medical Rx  

## 2010-12-14 NOTE — Patient Instructions (Signed)
Your physician wants you to follow-up in: YEAR WITH DR NISHAN  You will receive a reminder letter in the mail two months in advance. If you don't receive a letter, please call our office to schedule the follow-up appointment.  Your physician recommends that you continue on your current medications as directed. Please refer to the Current Medication list given to you today. 

## 2010-12-14 NOTE — Assessment & Plan Note (Signed)
Discussed low carb diet.  Sent note to Dr Arthur Holms.  May need to start daily glucophage

## 2010-12-14 NOTE — Assessment & Plan Note (Signed)
Well controlled.  Continue current medications and low sodium Dash type diet.    

## 2010-12-14 NOTE — Progress Notes (Signed)
Clifford [name] is seen today for F/U of CAD,HTN,elevated lipids. He has a distant history of CAD with PCI in 95, 2000 and 2002. Last cath in 2005 showed patent stents to the RCA and Circ wit h70% PDA disease in a small vessel. He has done well with medical therapy. His last myovue in 2008 showed small inferobasal MI with no ischemia. He walks 5 miles/day with no angina. His risk factors are well modified and lab work followed by Clifford [name]. There has been no PND, orhtopnea, palpitations, or edema. He has a history of prostate CA and has regular F/U with Clifford [name] with an undetectable PSA Review of labs from 9/11 shows LDL of 75 with normal LFT's  ROS: Denies fever, malais, weight loss, blurry vision, decreased visual acuity, cough, sputum, SOB, hemoptysis, pleuritic pain, palpitaitons, heartburn, abdominal pain, melena, lower extremity edema, claudication, or rash.  All other systems reviewed and negative  General: Affect appropriate Healthy:  appears stated age HEENT: normal Neck supple with no adenopathy JVP normal no bruits no thyromegaly Lungs clear with no wheezing and good diaphragmatic motion Heart:  S1/S2 no murmur,rub, gallop or click PMI normal Abdomen: benighn, BS positve, no tenderness, no AAA no bruit.  No HSM or HJR Distal pulses intact with no bruits No edema Neuro non-focal Skin warm and dry No muscular weakness   Current Outpatient Prescriptions  Medication Sig Dispense Refill  . ALPRAZolam (XANAX) 0.25 MG tablet Take 0.25 mg by mouth 4 (four) times daily as needed.        Marland Kitchen aspirin 81 MG tablet Take 81 mg by mouth daily.        Marland Kitchen ezetimibe-simvastatin (VYTORIN) 10-80 MG per tablet Take 1 tablet by mouth at bedtime.        Marland Kitchen lisinopril (PRINIVIL,ZESTRIL) 10 MG tablet 1/2 tab po qd      . Multiple Vitamin (MULTIVITAMIN) tablet Take 1 tablet by mouth daily.          Allergies  Review of patient's allergies indicates no known allergies.  Electrocardiogram: NSR 74 normal  ECG  Assessment and Plan

## 2010-12-21 ENCOUNTER — Other Ambulatory Visit: Payer: Self-pay | Admitting: Internal Medicine

## 2010-12-21 ENCOUNTER — Telehealth: Payer: Self-pay | Admitting: Cardiovascular Disease

## 2010-12-26 NOTE — Telephone Encounter (Signed)
Pt calling re refill request for lisinopril, e scribed and faxed, refill still not called in, pls call

## 2011-01-04 ENCOUNTER — Other Ambulatory Visit: Payer: Self-pay | Admitting: *Deleted

## 2011-01-04 MED ORDER — LISINOPRIL 10 MG PO TABS: ORAL_TABLET | ORAL | Status: DC

## 2011-08-27 ENCOUNTER — Encounter: Payer: Self-pay | Admitting: Internal Medicine

## 2011-08-31 ENCOUNTER — Other Ambulatory Visit: Payer: Self-pay | Admitting: *Deleted

## 2011-08-31 MED ORDER — EZETIMIBE-SIMVASTATIN 10-80 MG PO TABS: 1.0000 | ORAL_TABLET | Freq: Every day | ORAL | Status: DC

## 2011-08-31 NOTE — Telephone Encounter (Signed)
Refill sent to [facility], vytorin

## 2011-11-05 ENCOUNTER — Ambulatory Visit (AMBULATORY_SURGERY_CENTER): Payer: 59 | Admitting: *Deleted

## 2011-11-05 ENCOUNTER — Encounter: Payer: Self-pay | Admitting: Internal Medicine

## 2011-11-05 VITALS — Ht 65.0 in | Wt 166.0 lb

## 2011-11-05 DIAGNOSIS — Z1211 Encounter for screening for malignant neoplasm of colon: Secondary | ICD-10-CM

## 2011-11-05 MED ORDER — SUPREP BOWEL PREP KIT 17.5-3.13-1.6 GM/177ML PO SOLN: ORAL | Status: DC

## 2011-11-19 ENCOUNTER — Encounter: Payer: Self-pay | Admitting: Internal Medicine

## 2011-11-19 ENCOUNTER — Ambulatory Visit (AMBULATORY_SURGERY_CENTER): Payer: 59 | Admitting: Internal Medicine

## 2011-11-19 VITALS — BP 121/69 | HR 78 | Temp 96.9°F | Resp 16 | Ht 65.0 in | Wt 166.0 lb

## 2011-11-19 DIAGNOSIS — D126 Benign neoplasm of colon, unspecified: Secondary | ICD-10-CM

## 2011-11-19 DIAGNOSIS — Z1211 Encounter for screening for malignant neoplasm of colon: Secondary | ICD-10-CM

## 2011-11-19 DIAGNOSIS — K649 Unspecified hemorrhoids: Secondary | ICD-10-CM

## 2011-11-19 DIAGNOSIS — K5730 Diverticulosis of large intestine without perforation or abscess without bleeding: Secondary | ICD-10-CM

## 2011-11-19 MED ORDER — SODIUM CHLORIDE 0.9 % IV SOLN: 500.0000 mL | INTRAVENOUS | Status: DC

## 2011-11-19 NOTE — Progress Notes (Signed)
Propofol given and oxygen managed per J Nulty CRNA 

## 2011-11-19 NOTE — Patient Instructions (Addendum)
The colonoscopy showed 4 polyps (removed), diverticulosis and hemorrhoids.  Please read the information provided.  Thank you for choosing me and [facility].  Iva Boop, MD, FACG  YOU HAD AN ENDOSCOPIC PROCEDURE TODAY AT THE [facility]: Refer to the procedure report that was given to you for any specific questions about what was found during the examination.  If the procedure report does not answer your questions, please call your gastroenterologist to clarify.  If you requested that your care partner not be given the details of your procedure findings, then the procedure report has been included in a sealed envelope for you to review at your convenience later.  YOU SHOULD EXPECT: Some feelings of bloating in the abdomen. Passage of more gas than usual.  Walking can help get rid of the air that was put into your GI tract during the procedure and reduce the bloating. If you had a lower endoscopy (such as a colonoscopy or flexible sigmoidoscopy) you may notice spotting of blood in your stool or on the toilet paper. If you underwent a bowel prep for your procedure, then you may not have a normal bowel movement for a few days.  DIET: Your first meal following the procedure should be a light meal and then it is ok to progress to your normal diet.  A half-sandwich or bowl of soup is an example of a good first meal.  Heavy or fried foods are harder to digest and may make you feel nauseous or bloated.  Likewise meals heavy in dairy and vegetables can cause extra gas to form and this can also increase the bloating.  Drink plenty of fluids but you should avoid alcoholic beverages for 24 hours.  ACTIVITY: Your care partner should take you home directly after the procedure.  You should plan to take it easy, moving slowly for the rest of the day.  You can resume normal activity the day after the procedure however you should NOT DRIVE or use heavy machinery for 24 hours (because of  the sedation medicines used during the test).    SYMPTOMS TO REPORT IMMEDIATELY: A gastroenterologist can be reached at any hour.  During normal business hours, 8:30 AM to 5:00 PM Monday through Friday, call [phone].  After hours and on weekends, please call the GI answering service at [phone] who will take a message and have the physician on call contact you.   Following lower endoscopy (colonoscopy or flexible sigmoidoscopy):  Excessive amounts of blood in the stool  Significant tenderness or worsening of abdominal pains  Swelling of the abdomen that is new, acute  Fever of 100F or higher  Following upper endoscopy (EGD)  Vomiting of blood or coffee ground material  New chest pain or pain under the shoulder blades  Painful or persistently difficult swallowing  New shortness of breath  Fever of 100F or higher  Black, tarry-looking stools  FOLLOW UP: If any biopsies were taken you will be contacted by phone or by letter within the next 1-3 weeks.  Call your gastroenterologist if you have not heard about the biopsies in 3 weeks.  Our staff will call the home number listed on your records the next business day following your procedure to check on you and address any questions or concerns that you may have at that time regarding the information given to you following your procedure. This is a courtesy call and so if there is no answer at the home number and we  have not heard from you through the emergency physician on call, we will assume that you have returned to your regular daily activities without incident.  SIGNATURES/CONFIDENTIALITY: You and/or your care partner have signed paperwork which will be entered into your electronic medical record.  These signatures attest to the fact that that the information above on your After Visit Summary has been reviewed and is understood.  Full responsibility of the confidentiality of this discharge information lies with you and/or your  care-partner.

## 2011-11-19 NOTE — Progress Notes (Signed)
Patient did not experience any of the following events: a burn prior to discharge; a fall within the facility; wrong site/side/patient/procedure/implant event; or a hospital transfer or hospital admission upon discharge from the facility. (G8907) Patient did not have preoperative order for IV antibiotic SSI prophylaxis. (G8918)  

## 2011-11-19 NOTE — Op Note (Signed)
[facility] [address] [place], [address]   COLONOSCOPY PROCEDURE REPORT  PATIENT: Bricen, Victory  MR#: [id number] BIRTHDATE: September 12, 1945 , 66  yrs. old GENDER: Male ENDOSCOPIST: Iva Boop, MD, [facility] REFERRED BY: PROCEDURE DATE:  11/19/2011 PROCEDURE:   Colonoscopy with biopsy and Colonoscopy with snare polypectomy ASA CLASS:   Class III INDICATIONS:average risk screening. MEDICATIONS: propofol (Diprivan) 200mg  IV, MAC sedation, administered by CRNA, and These medications were titrated to patient response per physician's verbal order  DESCRIPTION OF PROCEDURE:   After the risks benefits and alternatives of the procedure were thoroughly explained, informed consent was obtained.  A digital rectal exam revealed no rectal mass and an absent prostate.   The LB [id number]  endoscope was introduced through the anus and advanced to the cecum, which was identified by both the appendix and ileocecal valve. No adverse events experienced.   The quality of the prep was Suprep good  The instrument was then slowly withdrawn as the colon was fully examined.      COLON FINDINGS: Four polypoid shaped sessile polyps measuring 3-12 mm in size were found at the cecum(30mm)and in the ascending colon. A polypectomy was performed with a cold snare, using snare cautery (cecum) and with cold forceps.  The resection was complete and the polyp tissue was completely retrieved.   There was severe diverticulosis noted in the sigmoid colon with associated muscular hypertrophy and luminal narrowing.   The colon mucosa was otherwise normal.  Retroflexed views revealed internal/external hemorrhoids and hypertrophied papillae. The time to cecum=2 minutes 37 seconds. Withdrawal time=19 minutes 56 seconds.  The scope was withdrawn and the procedure completed. COMPLICATIONS: There were no complications.  ENDOSCOPIC IMPRESSION: 1.   Four sessile polyps measuring 3-12 mm in  size were found at the cecum and in the ascending colon; polypectomy was performed with a cold snare, using snare cautery and with cold forceps 2.   There was severe diverticulosis noted in the sigmoid colon 3.   internal and external hemorrhoids with hypertrophied papillae. 3.   The colon mucosa was otherwise normal with good prep  RECOMMENDATIONS: Hold aspirin, aspirin products, and anti-inflammatory medication for 2 weeks.  eSigned:  Iva Boop, MD, [facility] 11/19/2011 9:11 AM   cc: The Patient and Jacques Navy, MD

## 2011-11-20 ENCOUNTER — Telehealth: Payer: Self-pay | Admitting: *Deleted

## 2011-11-20 NOTE — Telephone Encounter (Signed)
  Follow up Call-  Call back number 11/19/2011  Post procedure Call Back phone  # [phone]  Permission to leave phone message Yes     Patient questions:  Do you have a fever, pain , or abdominal swelling? no Pain Score  0 *  Have you tolerated food without any problems? yes  Have you been able to return to your normal activities? yes  Do you have any questions about your discharge instructions: Diet   no Medications  no Follow up visit  no  Do you have questions or concerns about your Care? no  Actions: * If pain score is 4 or above: No action needed, pain <4.

## 2011-11-23 ENCOUNTER — Encounter: Payer: Self-pay | Admitting: Internal Medicine

## 2011-11-23 DIAGNOSIS — Z860100 Personal history of colon polyps, unspecified: Secondary | ICD-10-CM | POA: Insufficient documentation

## 2011-11-23 NOTE — Progress Notes (Signed)
Quick Note:  4 tubular adenomas Max 12 mm Repeat colon about 10/2014 ______

## 2011-12-26 ENCOUNTER — Other Ambulatory Visit (INDEPENDENT_AMBULATORY_CARE_PROVIDER_SITE_OTHER): Payer: 59

## 2011-12-26 ENCOUNTER — Ambulatory Visit (INDEPENDENT_AMBULATORY_CARE_PROVIDER_SITE_OTHER): Payer: 59 | Admitting: Internal Medicine

## 2011-12-26 ENCOUNTER — Encounter: Payer: Self-pay | Admitting: Internal Medicine

## 2011-12-26 VITALS — BP 124/80 | HR 72 | Temp 98.5°F | Resp 16 | Wt 166.0 lb

## 2011-12-26 DIAGNOSIS — E119 Type 2 diabetes mellitus without complications: Secondary | ICD-10-CM

## 2011-12-26 DIAGNOSIS — Z0000 Encounter for general adult medical examination without abnormal findings: Secondary | ICD-10-CM

## 2011-12-26 DIAGNOSIS — I10 Essential (primary) hypertension: Secondary | ICD-10-CM

## 2011-12-26 DIAGNOSIS — I2510 Atherosclerotic heart disease of native coronary artery without angina pectoris: Secondary | ICD-10-CM

## 2011-12-26 DIAGNOSIS — Z23 Encounter for immunization: Secondary | ICD-10-CM

## 2011-12-26 DIAGNOSIS — E7800 Pure hypercholesterolemia, unspecified: Secondary | ICD-10-CM

## 2011-12-26 DIAGNOSIS — Z8546 Personal history of malignant neoplasm of prostate: Secondary | ICD-10-CM

## 2011-12-26 LAB — CBC WITH DIFFERENTIAL/PLATELET
Basophils Absolute: 0 10*3/uL (ref 0.0–0.1)
HCT: 48.2 % (ref 39.0–52.0)
Hemoglobin: 16 g/dL (ref 13.0–17.0)
Lymphs Abs: 2 10*3/uL (ref 0.7–4.0)
MCV: 95.8 fl (ref 78.0–100.0)
Monocytes Relative: 7.4 % (ref 3.0–12.0)
Neutro Abs: 5.5 10*3/uL (ref 1.4–7.7)
RDW: 12.7 % (ref 11.5–14.6)

## 2011-12-26 LAB — COMPREHENSIVE METABOLIC PANEL
BUN: 17 mg/dL (ref 6–23)
CO2: 28 mEq/L (ref 19–32)
Creatinine, Ser: 1.2 mg/dL (ref 0.4–1.5)
GFR: 66.44 mL/min (ref >60.00)
Glucose, Bld: 94 mg/dL (ref 70–99)
Total Bilirubin: 1.1 mg/dL (ref 0.3–1.2)
Total Protein: 7.3 g/dL (ref 6.0–8.3)

## 2011-12-26 LAB — LIPID PANEL
Cholesterol: 148 mg/dL (ref 0–200)
HDL: 43.2 mg/dL (ref >39.00)
Triglycerides: 144 mg/dL (ref 0.0–149.0)
VLDL: 28.8 mg/dL (ref 0.0–40.0)

## 2011-12-26 LAB — HEPATIC FUNCTION PANEL
AST: 21 U/L (ref 0–37)
Alkaline Phosphatase: 46 U/L (ref 39–117)
Bilirubin, Direct: 0.2 mg/dL (ref 0.0–0.3)
Total Bilirubin: 1.1 mg/dL (ref 0.3–1.2)

## 2011-12-26 LAB — HEMOGLOBIN A1C: Hgb A1c MFr Bld: 6.5 % (ref 4.6–6.5)

## 2011-12-26 MED ORDER — LISINOPRIL 10 MG PO TABS: ORAL_TABLET | ORAL | Status: DC

## 2011-12-26 MED ORDER — EZETIMIBE-SIMVASTATIN 10-80 MG PO TABS: 1.0000 | ORAL_TABLET | Freq: Every day | ORAL | Status: DC

## 2011-12-26 NOTE — Patient Instructions (Addendum)
Thanks for coming full report to follow.

## 2011-12-26 NOTE — Progress Notes (Signed)
Subjective:    Patient ID: Clifford [name], male    DOB: 04/20/1945, 66 y.o.   MRN: [id number]  HPI The patient is here for annual Medicare wellness examination and management of other chronic and acute problems. In the interval he has been doing well with no major illness, no surgery and no injury. He has been feeling well.    The risk factors are reflected in the social history.  The roster of all physicians providing medical care to patient - is listed in the Snapshot section of the chart.  Activities of daily living:  The patient is 100% inedpendent in all ADLs: dressing, toileting, feeding as well as independent mobility  Home safety : The patient has smoke detectors in the home. Fall - no falls. Home is fall-safe. They wear seatbelts. Firearms - took the 5th. Discussed gun safety.  There is no risks for hepatitis, STDs or HIV. There is no   history of blood transfusion. They have no travel history to infectious disease endemic areas of the world.  The patient has seen their dentist in the last six month. They have seen their eye doctor in the last year. They deny any hearing difficulty and have not had audiologic testing in the last year.    They do not  have excessive sun exposure. Discussed the need for sun protection: hats, long sleeves and use of sunscreen if there is significant sun exposure.   Diet: the importance of a healthy diet is discussed. They do have a healthy diet.  The patient has a regular exercise program: walking 3 miles a day , 60 min duration, 6 per week.  The benefits of regular aerobic exercise were discussed.  Depression screen: there are no signs or vegative symptoms of depression- irritability, change in appetite, anhedonia, sadness/tearfullness.  Cognitive assessment: the patient manages all their financial and personal affairs and is actively engaged.   Past Medical History  Diagnosis Date  . Hyperlipidemia   . Hypertension   . History of detached  retina repair 09-11/2006    [facility]  . CAD (coronary artery disease) 2005    Previous stents RCA and circ, Cath 2005 patient with 70% PDA  . History of prostate cancer   . History of hematuria   . Cataract   . Diabetes mellitus     diet controlled   Past Surgical History  Procedure Date  . Prostatectomy   . Ptca   . Lipoma excision   . Wrist ganglion excision   . Hemorrhoid surgery     I&D of thrombosed hemorrhoid  . Cardiac catheterization 05.31.2002    Initially the stenosis in the proximal to mid right coronary artery was estimated ar 95%. Following stenting, this improved to 0%. There was residual mild disease in the sostium of the proximal right coronary atrtery. Successful stentng of the proximal to mid right coronary artery with improvement in stent diameter narrowing from 95% to 0% Proc Date  07/29/2000  . Colonoscopy    Family History  Problem Relation Age of Onset  . Diabetes Sister   . Cancer Neg Hx     Colon   History   Social History  . Marital Status: Married    Spouse Name: N/A    Number of Children: N/A  . Years of Education: N/A   Occupational History  . Not on file.   Social History Main Topics  . Smoking status: Former Games developer  . Smokeless tobacco: Never Used  . Alcohol  Use: No  . Drug Use: No  . Sexually Active: Not on file   Other Topics Concern  . 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     During the course of the visit the patient was educated and counseled about appropriate screening and preventive services including : fall prevention , diabetes screening, nutrition counseling, colorectal cancer screening, and recommended immunizations.  Current Outpatient Prescriptions on File Prior to Visit  Medication Sig Dispense Refill  . aspirin 81 MG tablet Take 81 mg by mouth daily.        . Multiple Vitamin  (MULTIVITAMIN) tablet Take 1 tablet by mouth daily.        Marland Kitchen DISCONTD: ezetimibe-simvastatin (VYTORIN) 10-80 MG per tablet Take 1 tablet by mouth daily.  90 tablet  3  . DISCONTD: lisinopril (PRINIVIL,ZESTRIL) 10 MG tablet 1/2 tab po qd  90 tablet  3  . ALPRAZolam (XANAX) 0.25 MG tablet Take 0.25 mg by mouth 4 (four) times daily as needed.           Review of Systems /Constitutional:  Negative for fever, chills, activity change and unexpected weight change.  HEENT:  Negative for hearing loss, ear pain, congestion, neck stiffness and postnasal drip. Negative for sore throat or swallowing problems. Negative for dental complaints.   Eyes: Negative for vision loss or change in visual acuity.  Respiratory: Negative for chest tightness and wheezing. Negative for DOE.   Cardiovascular: Negative for chest pain or palpitations. No decreased exercise tolerance Gastrointestinal: No change in bowel habit. No bloating or gas. No reflux or indigestion Genitourinary: Negative for urgency, frequency, flank pain and difficulty urinating.  Musculoskeletal: Negative for myalgias, back pain, arthralgias and gait problem.  Neurological: Negative for dizziness, tremors, weakness and headaches.  Hematological: Negative for adenopathy.  Psychiatric/Behavioral: Negative for behavioral problems and dysphoric mood.       Objective:   Physical Exam Filed Vitals:   12/26/11 1321  BP: 124/80  Pulse: 72  Temp: 98.5 F (36.9 C)  Resp: 16   Wt Readings from Last 3 Encounters:  12/26/11 166 lb (75.297 kg)  11/19/11 166 lb (75.297 kg)  11/05/11 166 lb (75.297 kg)   Gen'l: Well nourished well developed white male in no acute distress  HEENT: Head: Normocephalic and atraumatic. Right Ear: External ear normal. EAC mild amount cerumen/TM nl. Left Ear: External ear normal.  EAC mild amount cerumen/TM nl. Nose: Nose normal. Mouth/Throat: Oropharynx is clear and moist. Dentition - native, in good repair. No buccal or  palatal lesions. Posterior pharynx clear. Eyes: Conjunctivae and sclera clear. EOM intact. Pupils are equal, round, and reactive to light. Right eye exhibits no discharge. Left eye exhibits no discharge. Neck: Normal range of motion. Neck supple. No JVD present. No tracheal deviation present. No thyromegaly present.  Cardiovascular: Normal rate, regular rhythm, no gallop, no friction rub, no murmur heard.      Quiet precordium. 2+ radial and DP pulses . No carotid bruits Pulmonary/Chest: Effort normal. No respiratory distress or increased WOB, no wheezes, no rales. No chest wall deformity or CVAT. Abdomen: Soft. Bowel sounds are normal in all quadrants. He exhibits no distension, no tenderness, no rebound or guarding, No heptosplenomegaly  Genitourinary:  deferred to GU Musculoskeletal: Normal range of motion. He exhibits no edema and no tenderness.       Small and  large joints without redness, synovial thickening or deformity. Full range of motion preserved about all small, median and large joints.  Lymphadenopathy:    He has no cervical or supraclavicular adenopathy.  Neurological: He is alert and oriented to person, place, and time. CN II-XII intact. DTRs 2+ and symmetrical biceps, radial and patellar tendons. Cerebellar function normal with no tremor, rigidity, normal gait and station.  Skin: Skin is warm and dry. No rash noted. No erythema.  Psychiatric: He has a normal mood and affect. His behavior is normal. Thought content normal.   Lab Results  Component Value Date   WBC 8.2 12/26/2011   HGB 16.0 12/26/2011   HCT 48.2 12/26/2011   PLT 248.0 12/26/2011   GLUCOSE 94 12/26/2011   CHOL 148 12/26/2011   TRIG 144.0 12/26/2011   HDL 43.20 12/26/2011   LDLCALC 76 12/26/2011        ALT 20 12/26/2011   AST 21 12/26/2011        NA 141 12/26/2011   K 4.5 12/26/2011   CL 105 12/26/2011   CREATININE 1.2 12/26/2011   BUN 17 12/26/2011   CO2 28 12/26/2011   TSH 3.55 11/27/2010   PSA 0.00  Repeated and verified X2.* 11/27/2010   HGBA1C 6.5 12/26/2011          Assessment & Plan:

## 2011-12-27 NOTE — Assessment & Plan Note (Signed)
Interval history is unremarkable - he has had a good year. Physical exam is normal. Labs reviewed - in normal range, see problem specific notes. He is due for routine every 10 year colonoscopy, immunizations are current except for shingles. He does follow on a regular basis with urology.  In summary - a very nice man who is medically stable and doing well. He will return in 1 year or sooner as needed.

## 2011-12-27 NOTE — Assessment & Plan Note (Signed)
LDL is better than goal of 80 or less for high risk patient. HDL is just at goal of 40+. Liver functions are normal  Plan - continue present medication.

## 2011-12-27 NOTE — Assessment & Plan Note (Signed)
BP Readings from Last 3 Encounters:  12/26/11 124/80  11/19/11 121/69  12/14/10 141/88   Excellent BP control. Kidney function and electrolytes normal  Plan - continue present medications

## 2011-12-27 NOTE — Assessment & Plan Note (Signed)
Stable with no complaints of chest pain. Last visit to Dr. Eden Emms October '12. Last screening Myoview '08  Plan - he will schedule his own appointment with Dr. Eden Emms

## 2011-12-27 NOTE — Assessment & Plan Note (Signed)
Lab Results  Component Value Date   HGBA1C 6.5 12/26/2011   Good control with life-style management alone. Keep up the good work.

## 2011-12-27 NOTE — Assessment & Plan Note (Signed)
Stable and doing well by is report. Follows with urology on a regular basis.

## 2012-02-01 ENCOUNTER — Other Ambulatory Visit: Payer: Self-pay | Admitting: Internal Medicine

## 2012-02-01 NOTE — Telephone Encounter (Signed)
VYTORIN TABLET NEW ATTEMPT

## 2012-02-07 ENCOUNTER — Telehealth: Payer: Self-pay | Admitting: *Deleted

## 2012-02-07 MED ORDER — EZETIMIBE-SIMVASTATIN 10-80 MG PO TABS: 1.0000 | ORAL_TABLET | Freq: Every day | ORAL | Status: DC

## 2012-02-07 NOTE — Telephone Encounter (Signed)
Left msg on triage stating mail service never received vytorin rx. Need script fax to [phone]. Called pt back inform him med was sent to [facility] will resend to [facility]...lmb

## 2012-11-24 HISTORY — PX: EYE SURGERY: SHX253

## 2012-11-26 ENCOUNTER — Other Ambulatory Visit: Payer: Self-pay | Admitting: Internal Medicine

## 2012-12-22 HISTORY — PX: EYE SURGERY: SHX253

## 2012-12-30 ENCOUNTER — Other Ambulatory Visit (INDEPENDENT_AMBULATORY_CARE_PROVIDER_SITE_OTHER): Payer: 59

## 2012-12-30 ENCOUNTER — Ambulatory Visit (INDEPENDENT_AMBULATORY_CARE_PROVIDER_SITE_OTHER): Payer: 59 | Admitting: Internal Medicine

## 2012-12-30 ENCOUNTER — Encounter: Payer: Self-pay | Admitting: Internal Medicine

## 2012-12-30 VITALS — BP 150/90 | HR 86 | Temp 97.4°F | Ht 64.0 in | Wt 174.6 lb

## 2012-12-30 DIAGNOSIS — E119 Type 2 diabetes mellitus without complications: Secondary | ICD-10-CM

## 2012-12-30 DIAGNOSIS — I2510 Atherosclerotic heart disease of native coronary artery without angina pectoris: Secondary | ICD-10-CM

## 2012-12-30 DIAGNOSIS — Z0000 Encounter for general adult medical examination without abnormal findings: Secondary | ICD-10-CM

## 2012-12-30 DIAGNOSIS — E7800 Pure hypercholesterolemia, unspecified: Secondary | ICD-10-CM

## 2012-12-30 DIAGNOSIS — I10 Essential (primary) hypertension: Secondary | ICD-10-CM

## 2012-12-30 DIAGNOSIS — Z23 Encounter for immunization: Secondary | ICD-10-CM

## 2012-12-30 LAB — COMPREHENSIVE METABOLIC PANEL
Albumin: 4.7 g/dL (ref 3.5–5.2)
Alkaline Phosphatase: 46 U/L (ref 39–117)
BUN: 16 mg/dL (ref 6–23)
CO2: 30 mEq/L (ref 19–32)
Calcium: 9.8 mg/dL (ref 8.4–10.5)
Chloride: 102 mEq/L (ref 96–112)
Creatinine, Ser: 1.1 mg/dL (ref 0.4–1.5)
GFR: 68.98 mL/min (ref >60.00)
Glucose, Bld: 117 mg/dL — ABNORMAL HIGH (ref 70–99)
Potassium: 4.6 mEq/L (ref 3.5–5.1)
Total Protein: 7.7 g/dL (ref 6.0–8.3)

## 2012-12-30 LAB — LIPID PANEL
LDL Cholesterol: 84 mg/dL (ref 0–99)
Total CHOL/HDL Ratio: 4
Triglycerides: 175 mg/dL — ABNORMAL HIGH (ref 0.0–149.0)
VLDL: 35 mg/dL (ref 0.0–40.0)

## 2012-12-30 LAB — HEPATIC FUNCTION PANEL
AST: 30 U/L (ref 0–37)
Albumin: 4.7 g/dL (ref 3.5–5.2)
Alkaline Phosphatase: 46 U/L (ref 39–117)
Bilirubin, Direct: 0.1 mg/dL (ref 0.0–0.3)

## 2012-12-30 LAB — HEMOGLOBIN A1C: Hgb A1c MFr Bld: 7.1 % — ABNORMAL HIGH (ref 4.6–6.5)

## 2012-12-30 NOTE — Progress Notes (Signed)
Pre-visit discussion using our clinic review tool. No additional management support is needed unless otherwise documented below in the visit note.  

## 2012-12-30 NOTE — Patient Instructions (Signed)
Thanks for coming to see me.  Your physical exam is fine.   Routine lab is ordered today - results will be posted to MyChart  Please review the packet on Advanced care planning and check the website: [email or web address]  You will need to schedule a nurse visit for the Prevnar pneumonia vaccine in 2+ weeks. This compliments the previous pneumonia vaccine you had.   Overall - you are in pretty good condition for the condition that you are in.

## 2012-12-30 NOTE — Progress Notes (Signed)
Subjective:    Patient ID: Clifford [name], male    DOB: December 12, 1945, 67 y.o.   MRN: [id number]  HPI The patient is here for annual Medicare wellness examination and management of other chronic and acute problems.  Interval - has had a flare of hemorrhoids; had cataract extraction with IOL OS Sept '14, OD Oct '14. He has otherwise done well.    The risk factors are reflected in the social history.  The roster of all physicians providing medical care to patient - is listed in the Snapshot section of the chart.  Activities of daily living:  The patient is 100% inedpendent in all ADLs: dressing, toileting, feeding as well as independent mobility  Home safety : The patient has smoke detectors in the home with recent battery change. . They wear seatbelts. Falls - none. There is no violence in the home.   There is no risks for hepatitis, STDs or HIV. There is no history of blood transfusion. They have no travel history to infectious disease endemic areas of the world.  The patient has seen their dentist in the last six month. They have seen their eye doctor in the last year - cataract surgery. They deny any hearing difficulty and have not had audiologic testing in the last year.    They do not  have excessive sun exposure. Discussed the need for sun protection: hats, long sleeves and use of sunscreen if there is significant sun exposure.   Diet: the importance of a healthy diet is discussed. They do have a healthy diet.  The patient has a regular exercise program: walks 3 miles , 60 min duration, 5 per week.  The benefits of regular aerobic exercise were discussed.  Depression screen: there are no signs or vegative symptoms of depression- irritability, change in appetite, anhedonia, sadness/tearfullness.  Cognitive assessment: the patient manages all their financial and personal affairs and is actively engaged. Question of mild loss of concentration.  The following portions of the patient's  history were reviewed and updated as appropriate: allergies, current medications, past family history, past medical history,  past surgical history, past social history  and problem list.  Vision, hearing, body mass index were assessed and reviewed.   During the course of the visit the patient was educated and counseled about appropriate screening and preventive services including : fall prevention , diabetes screening, nutrition counseling, colorectal cancer screening, and recommended immunizations.  Past Medical History  Diagnosis Date  . Hyperlipidemia   . Hypertension   . History of detached retina repair 09-11/2006    [facility]  . CAD (coronary artery disease) 2005    Previous stents RCA and circ, Cath 2005 patient with 70% PDA  . History of prostate cancer   . History of hematuria   . Cataract   . Diabetes mellitus     diet controlled   Past Surgical History  Procedure Laterality Date  . Prostatectomy    . Ptca    . Lipoma excision    . Wrist ganglion excision    . Hemorrhoid surgery      I&D of thrombosed hemorrhoid  . Cardiac catheterization  05.31.2002    Initially the stenosis in the proximal to mid right coronary artery was estimated ar 95%. Following stenting, this improved to 0%. There was residual mild disease in the sostium of the proximal right coronary atrtery. Successful stentng of the proximal to mid right coronary artery with improvement in stent diameter narrowing from 95% to 0%  Proc Date  07/29/2000  . Colonoscopy    . Eye surgery  11/24/2012    left eye cataract  . Eye surgery  12/22/2012    right eye cataract  . Cataract extraction w/ intraocular lens implant Bilateral OS Sept '14; OD Oct '14    Dr. Hazle Quant   Family History  Problem Relation Age of Onset  . Diabetes Sister   . Cancer Neg Hx     Colon   History   Social History  . Marital Status: Married    Spouse Name: N/A    Number of Children: N/A  . Years of Education: N/A   Occupational  History  . Not on file.   Social History Main Topics  . Smoking status: Former Games developer  . Smokeless tobacco: Never Used  . Alcohol Use: No  . Drug Use: No  . Sexual Activity: Not on file   Other Topics Concern  . 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     Current Outpatient Prescriptions on File Prior to Visit  Medication Sig Dispense Refill  . aspirin 81 MG tablet Take 81 mg by mouth daily.        Marland Kitchen lisinopril (PRINIVIL,ZESTRIL) 10 MG tablet 1/2 tab po qd  90 tablet  3  . Multiple Vitamin (MULTIVITAMIN) tablet Take 1 tablet by mouth daily.        Marland Kitchen VYTORIN 10-80 MG per tablet Take 1 tablet by mouth  daily  90 tablet  1   No current facility-administered medications on file prior to visit.      Review of Systems Constitutional:  Negative for fever, chills, activity change and unexpected weight change.  HEENT:  Negative for hearing loss, ear pain, congestion, neck stiffness and postnasal drip. Negative for sore throat or swallowing problems. Negative for dental complaints.   Eyes: Negative for vision loss or change in visual acuity.  Respiratory: Negative for chest tightness and wheezing. Negative for DOE.   Cardiovascular: Negative for chest pain or palpitations. No decreased exercise tolerance Gastrointestinal: No change in bowel habit. No bloating or gas. No reflux or indigestion Genitourinary: Negative for urgency, frequency, flank pain and difficulty urinating.  Musculoskeletal: Negative for myalgias, back pain, arthralgias and gait problem.  Neurological: Negative for dizziness, tremors, weakness and headaches.  Hematological: Negative for adenopathy.  Psychiatric/Behavioral: Negative for behavioral problems and dysphoric mood.       Objective:   Physical Exam Filed Vitals:   12/30/12 0841  BP: 150/90  Pulse: 86  Temp: 97.4 F (36.3  C)   Wt Readings from Last 3 Encounters:  12/30/12 174 lb 9.6 oz (79.198 kg)  12/26/11 166 lb (75.297 kg)  11/19/11 166 lb (75.297 kg)   Gen'l: Well nourished well developed male in no acute distress  HEENT: Head: Normocephalic and atraumatic. Right Ear: External ear normal. EAC/TM nl. Left Ear: External ear normal.  EAC/TM nl. Nose: Nose normal. Mouth/Throat: Oropharynx is clear and moist. Dentition - native, in good repair. No buccal or palatal lesions. Posterior pharynx clear. Eyes: Conjunctivae and sclera clear OS, mild erythema OD. EOM intact. Pupils are equal, round, and reactive to light. Right eye exhibits no discharge. Left eye exhibits no discharge. Small lesion on the upper outer left eye.  Neck: Normal range of motion. Neck supple. No JVD present. No tracheal deviation present.  No thyromegaly present.  Cardiovascular: Normal rate, regular rhythm, no gallop, no friction rub, no murmur heard.      Quiet precordium. 2+ radial and DP pulses . No carotid bruits Pulmonary/Chest: Effort normal. No respiratory distress or increased WOB, no wheezes, no rales. No chest wall deformity or CVAT. Abdomen: Soft. Bowel sounds are normal in all quadrants. He exhibits no distension, no tenderness, no rebound or guarding, No heptosplenomegaly  Genitourinary:  deferred to GU Musculoskeletal: Normal range of motion. He exhibits no edema and no tenderness.       Small and large joints without redness, synovial thickening or deformity. Full range of motion preserved about all small, median and large joints.  Lymphadenopathy:    He has no cervical or supraclavicular adenopathy.  Neurological: He is alert and oriented to person, place, and time. CN II-XII intact. DTRs 2+ and symmetrical biceps, radial and patellar tendons. Cerebellar function normal with no tremor, rigidity, normal gait and station.  Skin: Skin is warm and dry. No rash noted. No erythema.  Psychiatric: He has a normal mood and affect. His  behavior is normal. Thought content normal.    Recent Results (from the past 2160 hour(s))  LIPID PANEL     Status: Abnormal   Collection Time    12/30/12 10:10 AM      Result Value Range   Cholesterol 163  0 - 200 mg/dL   Comment: ATP III Classification       Desirable:  < 200 mg/dL               Borderline High:  200 - 239 mg/dL          High:  > = 161 mg/dL   Triglycerides 096.0 (*) 0.0 - 149.0 mg/dL   Comment: Normal:  <454 mg/dLBorderline High:  150 - 199 mg/dL   HDL 09.81  >19.14 mg/dL   VLDL 78.2  0.0 - 95.6 mg/dL   LDL Cholesterol 84  0 - 99 mg/dL   Total CHOL/HDL Ratio 4     Comment:                Men          Women1/2 Average Risk     3.4          3.3Average Risk          5.0          4.42X Average Risk          9.6          7.13X Average Risk          15.0          11.0                      HEPATIC FUNCTION PANEL     Status: None   Collection Time    12/30/12 10:10 AM      Result Value Range   Total Bilirubin 0.9  0.3 - 1.2 mg/dL   Bilirubin, Direct 0.1  0.0 - 0.3 mg/dL   Alkaline Phosphatase 46  39 - 117 U/L   AST 30  0 - 37 U/L   ALT 38  0 - 53 U/L   Total Protein 7.7  6.0 - 8.3 g/dL   Albumin 4.7  3.5 - 5.2 g/dL  TSH     Status: None   Collection Time    12/30/12 10:10 AM      Result  Value Range   TSH 2.05  0.35 - 5.50 uIU/mL  COMPREHENSIVE METABOLIC PANEL     Status: Abnormal   Collection Time    12/30/12 10:10 AM      Result Value Range   Sodium 139  135 - 145 mEq/L   Potassium 4.6  3.5 - 5.1 mEq/L   Chloride 102  96 - 112 mEq/L   CO2 30  19 - 32 mEq/L   Glucose, Bld 117 (*) 70 - 99 mg/dL   BUN 16  6 - 23 mg/dL   Creatinine, Ser 1.1  0.4 - 1.5 mg/dL   Total Bilirubin 0.9  0.3 - 1.2 mg/dL   Alkaline Phosphatase 46  39 - 117 U/L   AST 30  0 - 37 U/L   ALT 38  0 - 53 U/L   Total Protein 7.7  6.0 - 8.3 g/dL   Albumin 4.7  3.5 - 5.2 g/dL   Calcium 9.8  8.4 - 16.1 mg/dL   GFR 09.60  >45.40 mL/min  HEMOGLOBIN A1C     Status: Abnormal   Collection Time     12/30/12 10:10 AM      Result Value Range   Hemoglobin A1C 7.1 (*) 4.6 - 6.5 %   Comment: Glycemic Control Guidelines for People with Diabetes:Non Diabetic:  <6%Goal of Therapy: <7%Additional Action Suggested:  >8%         Assessment & Plan:

## 2012-12-31 NOTE — Assessment & Plan Note (Signed)
Interval history is negative and he is feeling well. Physical exam is normal. Lab results are good. He is current with  Colorectal cancer screening. Doing well after treatment for his prostate cancer. Immunizations - he will return for Prevnar in several weeks having had flu shot today.  In summary A nice man who is medically stable. There is a need to improve dietary control of diabetes. Follow up lab in 3 months.

## 2012-12-31 NOTE — Assessment & Plan Note (Signed)
BP Readings from Last 3 Encounters:  12/30/12 150/90  12/26/11 124/80  11/19/11 121/69   Generally good control but elevated today.  PLan Continue present medications  Recheck BP and report back.

## 2012-12-31 NOTE — Assessment & Plan Note (Signed)
Taking and tolerating "statin" plus zetia. Lab reveals good control with LDL close to goal of 80 or less. LFTs are normal.  Plan Continue present regimen of medication and diet.

## 2012-12-31 NOTE — Assessment & Plan Note (Signed)
No cardiac symptoms. He does see Dr. Eden Emms annually and is current with follow up testing.  Plan Continue risk factor reduction.

## 2012-12-31 NOTE — Assessment & Plan Note (Signed)
A1C 7.1% - on no medication.  Plan redouble efforts on no sugar low carb diet  Repeat A1C 3 months

## 2013-01-21 ENCOUNTER — Ambulatory Visit (INDEPENDENT_AMBULATORY_CARE_PROVIDER_SITE_OTHER): Payer: 59

## 2013-01-21 DIAGNOSIS — Z23 Encounter for immunization: Secondary | ICD-10-CM

## 2013-04-07 ENCOUNTER — Other Ambulatory Visit: Payer: Self-pay | Admitting: Internal Medicine

## 2013-10-19 ENCOUNTER — Other Ambulatory Visit: Payer: Self-pay

## 2013-10-19 MED ORDER — EZETIMIBE-SIMVASTATIN 10-80 MG PO TABS: ORAL_TABLET | ORAL | Status: DC

## 2013-10-21 ENCOUNTER — Other Ambulatory Visit: Payer: Self-pay

## 2013-10-21 MED ORDER — EZETIMIBE-SIMVASTATIN 10-80 MG PO TABS: ORAL_TABLET | ORAL | Status: DC

## 2013-11-06 ENCOUNTER — Telehealth: Payer: Self-pay | Admitting: *Deleted

## 2013-11-06 ENCOUNTER — Other Ambulatory Visit: Payer: Self-pay | Admitting: *Deleted

## 2013-11-06 MED ORDER — EZETIMIBE-SIMVASTATIN 10-80 MG PO TABS: ORAL_TABLET | ORAL | Status: DC

## 2013-11-06 NOTE — Telephone Encounter (Signed)
Pt was called and asked to make a nurse visit for BP check, pt refused to make appointment and will com ein for regular f/u in November

## 2013-11-30 LAB — HM DIABETES EYE EXAM

## 2013-12-07 ENCOUNTER — Encounter: Payer: 59 | Admitting: Internal Medicine

## 2014-01-14 ENCOUNTER — Encounter: Payer: Self-pay | Admitting: Internal Medicine

## 2014-01-14 ENCOUNTER — Other Ambulatory Visit (INDEPENDENT_AMBULATORY_CARE_PROVIDER_SITE_OTHER): Payer: 59

## 2014-01-14 ENCOUNTER — Ambulatory Visit (INDEPENDENT_AMBULATORY_CARE_PROVIDER_SITE_OTHER): Payer: 59 | Admitting: Internal Medicine

## 2014-01-14 VITALS — BP 142/84 | HR 69 | Temp 98.1°F | Ht 64.0 in | Wt 168.2 lb

## 2014-01-14 DIAGNOSIS — Z0000 Encounter for general adult medical examination without abnormal findings: Secondary | ICD-10-CM

## 2014-01-14 DIAGNOSIS — E119 Type 2 diabetes mellitus without complications: Secondary | ICD-10-CM

## 2014-01-14 DIAGNOSIS — I2510 Atherosclerotic heart disease of native coronary artery without angina pectoris: Secondary | ICD-10-CM

## 2014-01-14 DIAGNOSIS — Z23 Encounter for immunization: Secondary | ICD-10-CM

## 2014-01-14 LAB — CBC WITH DIFFERENTIAL/PLATELET
BASOS PCT: 0.4 % (ref 0.0–3.0)
Basophils Absolute: 0 10*3/uL (ref 0.0–0.1)
EOS PCT: 0.3 % (ref 0.0–5.0)
Eosinophils Absolute: 0 10*3/uL (ref 0.0–0.7)
HEMATOCRIT: 47.8 % (ref 39.0–52.0)
HEMOGLOBIN: 16.1 g/dL (ref 13.0–17.0)
LYMPHS ABS: 1.6 10*3/uL (ref 0.7–4.0)
Lymphocytes Relative: 19.2 % (ref 12.0–46.0)
MCHC: 33.7 g/dL (ref 30.0–36.0)
MCV: 94.6 fl (ref 78.0–100.0)
MONO ABS: 0.6 10*3/uL (ref 0.1–1.0)
Monocytes Relative: 7.8 % (ref 3.0–12.0)
NEUTROS ABS: 5.9 10*3/uL (ref 1.4–7.7)
Neutrophils Relative %: 72.3 % (ref 43.0–77.0)
PLATELETS: 257 10*3/uL (ref 150.0–400.0)
RBC: 5.05 Mil/uL (ref 4.22–5.81)
RDW: 12.9 % (ref 11.5–15.5)
WBC: 8.2 10*3/uL (ref 4.0–10.5)

## 2014-01-14 LAB — LIPID PANEL
CHOLESTEROL: 128 mg/dL (ref 0–200)
HDL: 39.6 mg/dL (ref >39.00)
LDL Cholesterol: 61 mg/dL (ref 0–99)
NonHDL: 88.4
Total CHOL/HDL Ratio: 3
Triglycerides: 136 mg/dL (ref 0.0–149.0)
VLDL: 27.2 mg/dL (ref 0.0–40.0)

## 2014-01-14 LAB — MICROALBUMIN / CREATININE URINE RATIO
Creatinine,U: 227.8 mg/dL
MICROALB UR: 1.2 mg/dL (ref 0.0–1.9)
MICROALB/CREAT RATIO: 0.5 mg/g (ref 0.0–30.0)

## 2014-01-14 LAB — BASIC METABOLIC PANEL
BUN: 19 mg/dL (ref 6–23)
CO2: 27 mEq/L (ref 19–32)
Calcium: 9.7 mg/dL (ref 8.4–10.5)
Chloride: 104 mEq/L (ref 96–112)
Creatinine, Ser: 1.2 mg/dL (ref 0.4–1.5)
GFR: 65.39 mL/min (ref >60.00)
GLUCOSE: 113 mg/dL — AB (ref 70–99)
POTASSIUM: 4.2 meq/L (ref 3.5–5.1)
Sodium: 140 mEq/L (ref 135–145)

## 2014-01-14 LAB — HEPATIC FUNCTION PANEL
ALT: 26 U/L (ref 0–53)
AST: 22 U/L (ref 0–37)
Albumin: 4.5 g/dL (ref 3.5–5.2)
Alkaline Phosphatase: 44 U/L (ref 39–117)
BILIRUBIN TOTAL: 1.4 mg/dL — AB (ref 0.2–1.2)
Bilirubin, Direct: 0.2 mg/dL (ref 0.0–0.3)
TOTAL PROTEIN: 7.2 g/dL (ref 6.0–8.3)

## 2014-01-14 LAB — HEMOGLOBIN A1C: HEMOGLOBIN A1C: 6.9 % — AB (ref 4.6–6.5)

## 2014-01-14 LAB — TSH: TSH: 1.13 u[IU]/mL (ref 0.35–4.50)

## 2014-01-14 NOTE — Progress Notes (Signed)
Subjective:    Patient ID: Clifford [name], male    DOB: 09-26-1945, 68 y.o.   MRN: [id number]  HPI  New to me, establishing as PCP, previously MEN  Here for medicare wellness  Diet: heart healthy, diabetic Physical activity: sedentary Depression/mood screen: negative Hearing: intact to whispered voice Visual acuity: grossly normal, performs annual eye exam  ADLs: capable Fall risk: none Home safety: good Cognitive evaluation: intact to orientation, naming, recall and repetition EOL planning: adv directives, full code/ I agree  I have personally reviewed and have noted 1. The patient's medical and social history 2. Their use of alcohol, tobacco or illicit drugs 3. Their current medications and supplements 4. The patient's functional ability including ADL's, fall risks, home safety risks and hearing or visual impairment. 5. Diet and physical activities 6. Evidence for depression or mood disorders  Also reviewed chronic medical issues and interval medical events  Past Medical History  Diagnosis Date  . Hyperlipidemia   . Hypertension   . History of detached retina repair 09-11/2006    [facility]  . CAD (coronary artery disease) 2005    Previous stents RCA and circ, Cath 2005 patient with 70% PDA  . History of prostate cancer   . History of hematuria   . Cataract   . Diabetes mellitus     diet controlled   Family History  Problem Relation Age of Onset  . Diabetes Sister   . Cancer Neg Hx     Colon   History  Substance Use Topics  . Smoking status: Former Research scientist (life sciences)  . Smokeless tobacco: Never Used  . Alcohol Use: No    Review of Systems  Constitutional: Negative for fever, activity change, appetite change, fatigue and unexpected weight change.  Respiratory: Negative for cough, chest tightness, shortness of breath and wheezing.   Cardiovascular: Negative for chest pain, palpitations and leg swelling.  Neurological: Negative for dizziness, weakness and  headaches.  Psychiatric/Behavioral: Negative for dysphoric mood. The patient is not nervous/anxious.   All other systems reviewed and are negative.      Objective:   Physical Exam  BP 142/84 mmHg  Pulse 69  Temp(Src) 98.1 F (36.7 C) (Oral)  Ht 5\' 4"  (1.626 m)  Wt 168 lb 4 oz (76.318 kg)  BMI 28.87 kg/m2  SpO2 96% Wt Readings from Last 3 Encounters:  01/14/14 168 lb 4 oz (76.318 kg)  12/30/12 174 lb 9.6 oz (79.198 kg)  12/26/11 166 lb (75.297 kg)   Constitutional: he appears well-developed and well-nourished. No distress.  Neck: Normal range of motion. Neck supple. No JVD present. No thyromegaly present.  Cardiovascular: Normal rate, regular rhythm and normal heart sounds.  No murmur heard. No BLE edema. Pulmonary/Chest: Effort normal and breath sounds normal. No respiratory distress. he has no wheezes.  Psychiatric: he has a normal mood and affect. His behavior is normal. Judgment and thought content normal.   Lab Results  Component Value Date   WBC 8.2 12/26/2011   HGB 16.0 12/26/2011   HCT 48.2 12/26/2011   PLT 248.0 12/26/2011   GLUCOSE 117* 12/30/2012   CHOL 163 12/30/2012   TRIG 175.0* 12/30/2012   HDL 44.10 12/30/2012   LDLCALC 84 12/30/2012   ALT 38 12/30/2012   ALT 38 12/30/2012   AST 30 12/30/2012   AST 30 12/30/2012   NA 139 12/30/2012   K 4.6 12/30/2012   CL 102 12/30/2012   CREATININE 1.1 12/30/2012   BUN 16 12/30/2012  CO2 30 12/30/2012   TSH 2.05 12/30/2012   PSA 0.00 Repeated and verified X2.* 11/27/2010   HGBA1C 7.1* 12/30/2012    No results found.     Assessment & Plan:   CPX/AWV - z00.00 - Today patient counseled on age appropriate routine health concerns for screening and prevention, each reviewed and up to date or declined. Immunizations reviewed and up to date or declined. Labs reviewed. Risk factors for depression reviewed and negative. Hearing function and visual acuity are intact. ADLs screened and addressed as needed. Functional  ability and level of safety reviewed and appropriate. Education, counseling and referrals performed based on assessed risks today. Patient provided with a copy of personalized plan for preventive services.  Problem List Items Addressed This Visit    Coronary atherosclerosis    No cardiac symptoms.  He does see Dr. Johnsie Cancel annually and is current with follow up testing. Continue risk factor reduction interval reviewed    Relevant Orders      Basic metabolic panel      CBC with Differential      Lipid panel   Diet-controlled type 2 diabetes mellitus    Not on rx med for same focued efforts on no sugar low carb diet  Lab Results  Component Value Date   HGBA1C 7.1* 12/30/2012       Relevant Orders      Basic metabolic panel      Hemoglobin A1c      Microalbumin / creatinine urine ratio    Other Visit Diagnoses    Routine general medical examination at a health care facility    -  Primary    Relevant Orders       Basic metabolic panel       CBC with Differential       Hepatic function panel       Lipid panel       TSH    Need for prophylactic vaccination and inoculation against influenza        Relevant Orders       Flu Vaccine QUAD 36+ mos PF IM (Fluarix Quad PF) (Completed)

## 2014-01-14 NOTE — Progress Notes (Signed)
Pre visit review using our clinic review tool, if applicable. No additional management support is needed unless otherwise documented below in the visit note. 

## 2014-01-14 NOTE — Patient Instructions (Addendum)
It was good to see you today.  We have reviewed your prior records including labs and tests today  Health Maintenance reviewed - all recommended immunizations and age-appropriate screenings are up-to-date.  Test(s) ordered today. Your results will be released to MyChart (or called to you) after review, usually within 72hours after test completion. If any changes need to be made, you will be notified at that same time.  Medications reviewed and updated, no changes recommended at this time.  Please schedule followup in 12 months for annual exam and labs, call sooner if problems.  Health Maintenance A healthy lifestyle and preventative care can promote health and wellness.  Maintain regular health, dental, and eye exams.  Eat a healthy diet. Foods like vegetables, fruits, whole grains, low-fat dairy products, and lean protein foods contain the nutrients you need and are low in calories. Decrease your intake of foods high in solid fats, added sugars, and salt. Get information about a proper diet from your health care provider, if necessary.  Regular physical exercise is one of the most important things you can do for your health. Most adults should get at least 150 minutes of moderate-intensity exercise (any activity that increases your heart rate and causes you to sweat) each week. In addition, most adults need muscle-strengthening exercises on 2 or more days a week.   Maintain a healthy weight. The body mass index (BMI) is a screening tool to identify possible weight problems. It provides an estimate of body fat based on height and weight. Your health care provider can find your BMI and can help you achieve or maintain a healthy weight. For males 20 years and older:  A BMI below 18.5 is considered underweight.  A BMI of 18.5 to 24.9 is normal.  A BMI of 25 to 29.9 is considered overweight.  A BMI of 30 and above is considered obese.  Maintain normal blood lipids and cholesterol by  exercising and minimizing your intake of saturated fat. Eat a balanced diet with plenty of fruits and vegetables. Blood tests for lipids and cholesterol should begin at age 20 and be repeated every 5 years. If your lipid or cholesterol levels are high, you are over age 50, or you are at high risk for heart disease, you may need your cholesterol levels checked more frequently.Ongoing high lipid and cholesterol levels should be treated with medicines if diet and exercise are not working.  If you smoke, find out from your health care provider how to quit. If you do not use tobacco, do not start.  Lung cancer screening is recommended for adults aged 55-80 years who are at high risk for developing lung cancer because of a history of smoking. A yearly low-dose CT scan of the lungs is recommended for people who have at least a 30-pack-year history of smoking and are current smokers or have quit within the past 15 years. A pack year of smoking is smoking an average of 1 pack of cigarettes a day for 1 year (for example, a 30-pack-year history of smoking could mean smoking 1 pack a day for 30 years or 2 packs a day for 15 years). Yearly screening should continue until the smoker has stopped smoking for at least 15 years. Yearly screening should be stopped for people who develop a health problem that would prevent them from having lung cancer treatment.  If you choose to drink alcohol, do not have more than 2 drinks per day. One drink is considered to be 12 oz (  360 mL) of beer, 5 oz (150 mL) of wine, or 1.5 oz (45 mL) of liquor.  Avoid the use of street drugs. Do not share needles with anyone. Ask for help if you need support or instructions about stopping the use of drugs.  High blood pressure causes heart disease and increases the risk of stroke. Blood pressure should be checked at least every 1-2 years. Ongoing high blood pressure should be treated with medicines if weight loss and exercise are not  effective.  If you are 45-79 years old, ask your health care provider if you should take aspirin to prevent heart disease.  Diabetes screening involves taking a blood sample to check your fasting blood sugar level. This should be done once every 3 years after age 45 if you are at a normal weight and without risk factors for diabetes. Testing should be considered at a younger age or be carried out more frequently if you are overweight and have at least 1 risk factor for diabetes.  Colorectal cancer can be detected and often prevented. Most routine colorectal cancer screening begins at the age of 50 and continues through age 75. However, your health care provider may recommend screening at an earlier age if you have risk factors for colon cancer. On a yearly basis, your health care provider may provide home test kits to check for hidden blood in the stool. A small camera at the end of a tube may be used to directly examine the colon (sigmoidoscopy or colonoscopy) to detect the earliest forms of colorectal cancer. Talk to your health care provider about this at age 50 when routine screening begins. A direct exam of the colon should be repeated every 5-10 years through age 75, unless early forms of precancerous polyps or small growths are found.  People who are at an increased risk for hepatitis B should be screened for this virus. You are considered at high risk for hepatitis B if:  You were born in a country where hepatitis B occurs often. Talk with your health care provider about which countries are considered high risk.  Your parents were born in a high-risk country and you have not received a shot to protect against hepatitis B (hepatitis B vaccine).  You have HIV or AIDS.  You use needles to inject street drugs.  You live with, or have sex with, someone who has hepatitis B.  You are a man who has sex with other men (MSM).  You get hemodialysis treatment.  You take certain medicines for  conditions like cancer, organ transplantation, and autoimmune conditions.  Hepatitis C blood testing is recommended for all people born from 1945 through 1965 and any individual with known risk factors for hepatitis C.  Healthy men should no longer receive prostate-specific antigen (PSA) blood tests as part of routine cancer screening. Talk to your health care provider about prostate cancer screening.  Testicular cancer screening is not recommended for adolescents or adult males who have no symptoms. Screening includes self-exam, a health care provider exam, and other screening tests. Consult with your health care provider about any symptoms you have or any concerns you have about testicular cancer.  Practice safe sex. Use condoms and avoid high-risk sexual practices to reduce the spread of sexually transmitted infections (STIs).  You should be screened for STIs, including gonorrhea and chlamydia if:  You are sexually active and are younger than 24 years.  You are older than 24 years, and your health care provider tells you   that you are at risk for this type of infection.  Your sexual activity has changed since you were last screened, and you are at an increased risk for chlamydia or gonorrhea. Ask your health care provider if you are at risk.  If you are at risk of being infected with HIV, it is recommended that you take a prescription medicine daily to prevent HIV infection. This is called pre-exposure prophylaxis (PrEP). You are considered at risk if:  You are a man who has sex with other men (MSM).  You are a heterosexual man who is sexually active with multiple partners.  You take drugs by injection.  You are sexually active with a partner who has HIV.  Talk with your health care provider about whether you are at high risk of being infected with HIV. If you choose to begin PrEP, you should first be tested for HIV. You should then be tested every 3 months for as long as you are taking  PrEP.  Use sunscreen. Apply sunscreen liberally and repeatedly throughout the day. You should seek shade when your shadow is shorter than you. Protect yourself by wearing long sleeves, pants, a wide-brimmed hat, and sunglasses year round whenever you are outdoors.  Tell your health care provider of new moles or changes in moles, especially if there is a change in shape or color. Also, tell your health care provider if a mole is larger than the size of a pencil eraser.  A one-time screening for abdominal aortic aneurysm (AAA) and surgical repair of large AAAs by ultrasound is recommended for men aged 65-75 years who are current or former smokers.  Stay current with your vaccines (immunizations). Document Released: 08/11/2007 Document Revised: 02/17/2013 Document Reviewed: 07/10/2010 Exit[employer] Patient Information 2015 [employer]. This information is not intended to replace advice given to you by your health care provider. Make sure you discuss any questions you have with your health care provider.  

## 2014-01-14 NOTE — Assessment & Plan Note (Signed)
Not on rx med for same focued efforts on no sugar low carb diet  Lab Results  Component Value Date   HGBA1C 7.1* 12/30/2012

## 2014-01-14 NOTE — Assessment & Plan Note (Signed)
No cardiac symptoms.  He does see Dr. Johnsie Cancel annually and is current with follow up testing. Continue risk factor reduction interval reviewed

## 2014-05-24 ENCOUNTER — Other Ambulatory Visit: Payer: Self-pay | Admitting: Internal Medicine

## 2014-07-26 LAB — HM DIABETES EYE EXAM

## 2014-08-23 ENCOUNTER — Other Ambulatory Visit: Payer: Self-pay

## 2014-09-24 ENCOUNTER — Telehealth: Payer: Self-pay | Admitting: Internal Medicine

## 2014-09-24 MED ORDER — LISINOPRIL 10 MG PO TABS: 5.0000 mg | ORAL_TABLET | Freq: Every day | ORAL | Status: DC

## 2014-09-24 NOTE — Telephone Encounter (Signed)
[facility] requesting a refill of lisinopril (PRINIVIL,ZESTRIL) 10 MG tablet [[id number]] . Advised that it would most likely need an OV

## 2014-09-24 NOTE — Telephone Encounter (Signed)
Pt has an upcoming appt with PCP. Refilled to last til that appt.  erx done. (sent to mail order by mistake) resent to [facility].

## 2015-01-11 ENCOUNTER — Encounter: Payer: Self-pay | Admitting: Cardiovascular Disease

## 2015-01-17 ENCOUNTER — Encounter: Payer: Self-pay | Admitting: Internal Medicine

## 2015-01-17 ENCOUNTER — Other Ambulatory Visit: Payer: 59

## 2015-01-17 ENCOUNTER — Ambulatory Visit (INDEPENDENT_AMBULATORY_CARE_PROVIDER_SITE_OTHER): Payer: 59 | Admitting: Internal Medicine

## 2015-01-17 ENCOUNTER — Other Ambulatory Visit (INDEPENDENT_AMBULATORY_CARE_PROVIDER_SITE_OTHER): Payer: 59

## 2015-01-17 VITALS — BP 148/84 | HR 80 | Temp 97.6°F | Ht 64.0 in | Wt 176.0 lb

## 2015-01-17 DIAGNOSIS — Z860100 Personal history of colon polyps, unspecified: Secondary | ICD-10-CM

## 2015-01-17 DIAGNOSIS — R5383 Other fatigue: Secondary | ICD-10-CM

## 2015-01-17 DIAGNOSIS — E7800 Pure hypercholesterolemia, unspecified: Secondary | ICD-10-CM | POA: Diagnosis not present

## 2015-01-17 DIAGNOSIS — E119 Type 2 diabetes mellitus without complications: Secondary | ICD-10-CM

## 2015-01-17 DIAGNOSIS — Z23 Encounter for immunization: Secondary | ICD-10-CM | POA: Diagnosis not present

## 2015-01-17 DIAGNOSIS — I2510 Atherosclerotic heart disease of native coronary artery without angina pectoris: Secondary | ICD-10-CM | POA: Diagnosis not present

## 2015-01-17 DIAGNOSIS — I10 Essential (primary) hypertension: Secondary | ICD-10-CM | POA: Diagnosis not present

## 2015-01-17 DIAGNOSIS — Z0000 Encounter for general adult medical examination without abnormal findings: Secondary | ICD-10-CM | POA: Diagnosis not present

## 2015-01-17 LAB — BASIC METABOLIC PANEL
BUN: 14 mg/dL (ref 6–23)
CHLORIDE: 102 meq/L (ref 96–112)
CO2: 25 meq/L (ref 19–32)
CREATININE: 1.21 mg/dL (ref 0.40–1.50)
Calcium: 10.1 mg/dL (ref 8.4–10.5)
GFR: 62.73 mL/min (ref >60.00)
Glucose, Bld: 123 mg/dL — ABNORMAL HIGH (ref 70–99)
POTASSIUM: 4.2 meq/L (ref 3.5–5.1)
Sodium: 138 mEq/L (ref 135–145)

## 2015-01-17 LAB — LIPID PANEL
CHOLESTEROL: 140 mg/dL (ref 0–200)
HDL: 42 mg/dL (ref >39.00)
LDL Cholesterol: 68 mg/dL (ref 0–99)
NonHDL: 97.59
TRIGLYCERIDES: 149 mg/dL (ref 0.0–149.0)
Total CHOL/HDL Ratio: 3
VLDL: 29.8 mg/dL (ref 0.0–40.0)

## 2015-01-17 LAB — HEPATIC FUNCTION PANEL
ALBUMIN: 4.8 g/dL (ref 3.5–5.2)
ALT: 56 U/L — AB (ref 0–53)
AST: 37 U/L (ref 0–37)
Alkaline Phosphatase: 52 U/L (ref 39–117)
Bilirubin, Direct: 0.2 mg/dL (ref 0.0–0.3)
TOTAL PROTEIN: 7.6 g/dL (ref 6.0–8.3)
Total Bilirubin: 1.1 mg/dL (ref 0.2–1.2)

## 2015-01-17 LAB — HEMOGLOBIN A1C: HEMOGLOBIN A1C: 7.4 % — AB (ref 4.6–6.5)

## 2015-01-17 LAB — MICROALBUMIN / CREATININE URINE RATIO
CREATININE, U: 217.3 mg/dL
MICROALB/CREAT RATIO: 0.7 mg/g (ref 0.0–30.0)
Microalb, Ur: 1.5 mg/dL (ref 0.0–1.9)

## 2015-01-17 LAB — HEPATITIS C ANTIBODY: HCV AB: NEGATIVE

## 2015-01-17 MED ORDER — MUPIROCIN CALCIUM 2 % EX CREA: 1.0000 "application " | TOPICAL_CREAM | Freq: Two times a day (BID) | CUTANEOUS | Status: DC

## 2015-01-17 MED ORDER — LISINOPRIL 10 MG PO TABS: 5.0000 mg | ORAL_TABLET | Freq: Every day | ORAL | Status: DC

## 2015-01-17 MED ORDER — EZETIMIBE-SIMVASTATIN 10-80 MG PO TABS: 1.0000 | ORAL_TABLET | Freq: Every day | ORAL | Status: DC

## 2015-01-17 NOTE — Assessment & Plan Note (Signed)
No cardiac symptoms.  Historically, followed with cards Dr. Johnsie Cancel annually - due for follow up testing now. Continue risk factor reduction interval reviewed

## 2015-01-17 NOTE — Assessment & Plan Note (Signed)
BP Readings from Last 3 Encounters:  01/17/15 148/84  01/14/14 142/84  12/30/12 150/90   Generally good control but elevated today. Continue present medications Recheck BP and report back for dose adjust as needed

## 2015-01-17 NOTE — Progress Notes (Signed)
Pre visit review using our clinic review tool, if applicable. No additional management support is needed unless otherwise documented below in the visit note. 

## 2015-01-17 NOTE — Progress Notes (Signed)
Subjective:    Patient ID: Clifford [name], male    DOB: February 07, 1946, 69 y.o.   MRN: [id number]  HPI  Here for medicare wellness  Diet: heart healthy and diabetic Physical activity: sedentary Depression/mood screen: negative Hearing: intact to whispered voice Visual acuity: grossly normal, performs annual eye exam  ADLs: capable Fall risk: none Home safety: good Cognitive evaluation: intact to orientation, naming, recall and repetition EOL planning: adv directives, full code/ I agree  I have personally reviewed and have noted 1. The patient's medical and social history 2. Their use of alcohol, tobacco or illicit drugs 3. Their current medications and supplements 4. The patient's functional ability including ADL's, fall risks, home safety risks and hearing or visual impairment. 5. Diet and physical activities 6. Evidence for depression or mood disorders  Also reviewed chronic medical conditions, interval events and current concerns   Past Medical History  Diagnosis Date  . Hyperlipidemia   . Hypertension   . History of detached retina repair 11-06-2006    [facility]  . CAD (coronary artery disease) 2005    Previous stents RCA and circ, Cath 2005 patient with 70% PDA  . History of prostate cancer 03/2005  . History of hematuria   . Cataract     replace 11/2012  . Diabetes mellitus     diet controlled   Family History  Problem Relation Age of Onset  . Diabetes Sister   . Cancer Neg Hx     Colon  . CAD Father    Social History  Substance Use Topics  . Smoking status: Former Smoker    Quit date: 02/27/1983  . Smokeless tobacco: Never Used  . Alcohol Use: No    Review of Systems  Constitutional: Positive for fatigue (mild, nonsp). Negative for fever, activity change, appetite change and unexpected weight change.  Respiratory: Negative for cough, chest tightness, shortness of breath and wheezing.   Cardiovascular: Negative for chest pain, palpitations and leg  swelling.  Neurological: Negative for dizziness, weakness and headaches.  Psychiatric/Behavioral: Negative for dysphoric mood. The patient is not nervous/anxious.   All other systems reviewed and are negative.  Patient Care Team: Rowe Clack, MD as PCP - General (Internal Medicine) Josue Hector, MD (Cardiology) Raynelle Bring, MD (Urology) Calvert Cantor, MD (Ophthalmology) Zebedee Iba, MD (Ophthalmology) Gatha Mayer, MD (Gastroenterology)     Objective:    Physical Exam  Constitutional: He appears well-developed and well-nourished. No distress.  Cardiovascular: Normal rate, regular rhythm and normal heart sounds.   No murmur heard. Pulmonary/Chest: Effort normal and breath sounds normal. No respiratory distress.  Skin:  Mildly inflamed insect bite posterior left thigh, patient reports same for last 2 weeks, unimproved with over-the-counter Neosporin    BP 148/84 mmHg  Pulse 80  Temp(Src) 97.6 F (36.4 C) (Oral)  Ht 5\' 4"  (1.626 m)  Wt 176 lb (79.833 kg)  BMI 30.20 kg/m2  SpO2 95% Wt Readings from Last 3 Encounters:  01/17/15 176 lb (79.833 kg)  01/14/14 168 lb 4 oz (76.318 kg)  12/30/12 174 lb 9.6 oz (79.198 kg)    Lab Results  Component Value Date   WBC 8.2 01/14/2014   HGB 16.1 01/14/2014   HCT 47.8 01/14/2014   PLT 257.0 01/14/2014   GLUCOSE 113* 01/14/2014   CHOL 128 01/14/2014   TRIG 136.0 01/14/2014   HDL 39.60 01/14/2014   LDLCALC 61 01/14/2014   ALT 26 01/14/2014   AST 22 01/14/2014  NA 140 01/14/2014   K 4.2 01/14/2014   CL 104 01/14/2014   CREATININE 1.2 01/14/2014   BUN 19 01/14/2014   CO2 27 01/14/2014   TSH 1.13 01/14/2014   PSA 0.00 Repeated and verified X2.* 11/27/2010   HGBA1C 6.9* 01/14/2014   MICROALBUR 1.2 01/14/2014    No results found.     Assessment & Plan:   Problem List Items Addressed This Visit    Coronary atherosclerosis    No cardiac symptoms.  Historically, followed with cards Dr. Johnsie Cancel annually - due  for follow up testing now. Continue risk factor reduction interval reviewed      Relevant Medications   lisinopril (PRINIVIL,ZESTRIL) 10 MG tablet   ezetimibe-simvastatin (VYTORIN) 10-80 MG tablet   Other Relevant Orders   Lipid panel   Hepatitis C antibody   Diet-controlled type 2 diabetes mellitus ([place])    Not on rx med for same - does not monitor home cbgs focued efforts on no sugar low carb diet and weight control (trends reviewed) Check a1c and microalb  Lab Results  Component Value Date   HGBA1C 6.9* 01/14/2014        Relevant Medications   lisinopril (PRINIVIL,ZESTRIL) 10 MG tablet   Other Relevant Orders   Lipid panel   Microalbumin / creatinine urine ratio   Hemoglobin A1c   Essential hypertension    BP Readings from Last 3 Encounters:  01/17/15 148/84  01/14/14 142/84  12/30/12 150/90   Generally good control but elevated today. Continue present medications Recheck BP and report back for dose adjust as needed        Relevant Medications   lisinopril (PRINIVIL,ZESTRIL) 10 MG tablet   ezetimibe-simvastatin (VYTORIN) 10-80 MG tablet   Other Relevant Orders   Lipid panel   HYPERCHOLESTEROLEMIA    Taking and tolerating "statin" plus zetia combo for several years (since 2010) Check labs (lipids/LFTs) annually for CAD med mgmt      Relevant Medications   lisinopril (PRINIVIL,ZESTRIL) 10 MG tablet   ezetimibe-simvastatin (VYTORIN) 10-80 MG tablet   Personal history of colonic polyps-adenomas    Patient aware he is now due for his 3 year follow-up and will arrange for same with Dr. Carlean Purl as planned      Relevant Orders   Hepatitis C antibody    Other Visit Diagnoses    Routine general medical examination at a health care facility    -  Primary    Other fatigue        Relevant Orders    Basic metabolic panel    CBC with Differential/Platelet    Hepatic function panel      Insect bite posterior R thigh - rx abx cream x 2 weeks and keep covered -  call if unimproved for other tx prn  Gwendolyn Grant, MD

## 2015-01-17 NOTE — Assessment & Plan Note (Signed)
Taking and tolerating "statin" plus zetia combo for several years (since 2010) Check labs (lipids/LFTs) annually for CAD med mgmt

## 2015-01-17 NOTE — Patient Instructions (Signed)
It was good to see you today.  We have reviewed your prior records including labs and tests today  Flu shot and Pneumovax pneumonia vaccine updated today. Other Health Maintenance reviewed - please schedule colonoscopy as you are doing with Dr. Carlean Purl. Other recommended immunizations and age-appropriate screenings are up-to-date.  Test(s) ordered today. Your results will be released to Clifford [name] (or called to you) after review, usually within 72hours after test completion. If any changes need to be made, you will be notified at that same time.  Medications reviewed and updated, no changes recommended at this time. Okay for antibiotic cream to thigh as discussed. Please notify us if worse or unimproved in next several weeks  Please schedule followup in 12 months for annual exam and labs with Dr. [name], call sooner if problems.

## 2015-01-17 NOTE — Assessment & Plan Note (Signed)
Not on rx med for same - does not monitor home cbgs focued efforts on no sugar low carb diet and weight control (trends reviewed) Check a1c and microalb  Lab Results  Component Value Date   HGBA1C 6.9* 01/14/2014

## 2015-01-17 NOTE — Assessment & Plan Note (Signed)
Patient aware he is now due for his 3 year follow-up and will arrange for same with Dr. Carlean Purl as planned

## 2015-01-18 LAB — CBC WITH DIFFERENTIAL/PLATELET
BASOS PCT: 0.4 % (ref 0.0–3.0)
Basophils Absolute: 0 10*3/uL (ref 0.0–0.1)
EOS ABS: 0 10*3/uL (ref 0.0–0.7)
EOS PCT: 0.4 % (ref 0.0–5.0)
HEMATOCRIT: 51.6 % (ref 39.0–52.0)
Hemoglobin: 17.5 g/dL — ABNORMAL HIGH (ref 13.0–17.0)
LYMPHS PCT: 19 % (ref 12.0–46.0)
Lymphs Abs: 1.4 10*3/uL (ref 0.7–4.0)
MCHC: 34 g/dL (ref 30.0–36.0)
MCV: 95.4 fl (ref 78.0–100.0)
MONOS PCT: 6.1 % (ref 3.0–12.0)
Monocytes Absolute: 0.4 10*3/uL (ref 0.1–1.0)
NEUTROS ABS: 5.3 10*3/uL (ref 1.4–7.7)
Neutrophils Relative %: 74.1 % (ref 43.0–77.0)
PLATELETS: 255 10*3/uL (ref 150.0–400.0)
RBC: 5.41 Mil/uL (ref 4.22–5.81)
RDW: 12.9 % (ref 11.5–15.5)
WBC: 7.2 10*3/uL (ref 4.0–10.5)

## 2015-01-24 ENCOUNTER — Encounter: Payer: Self-pay | Admitting: Internal Medicine

## 2015-01-25 ENCOUNTER — Encounter: Payer: Self-pay | Admitting: Internal Medicine

## 2015-03-29 ENCOUNTER — Telehealth: Payer: Self-pay | Admitting: Internal Medicine

## 2015-03-29 ENCOUNTER — Ambulatory Visit (AMBULATORY_SURGERY_CENTER): Payer: Self-pay | Admitting: *Deleted

## 2015-03-29 VITALS — Ht 65.0 in | Wt 177.8 lb

## 2015-03-29 DIAGNOSIS — Z86010 Personal history of colonic polyps: Secondary | ICD-10-CM

## 2015-03-29 NOTE — Telephone Encounter (Signed)
Pt questioned if he has to refrigerate his miralax. Gatorade. Instructed no. If he likes things warmer or room temperature he can mix and then drink as directed. Pt  Instructed his sister only had polyps, no cancer because he thought he told me cancer but only polyps was documented. Told pt to call with further questions  Marijean Niemann

## 2015-03-29 NOTE — Progress Notes (Signed)
No egg or soy allergy known to patient  No issues with past sedation with any surgeries  or procedures, no intubation problems  No diet pills per patient No home 02 use per patient  No blood thinners per patient    

## 2015-04-12 ENCOUNTER — Ambulatory Visit (AMBULATORY_SURGERY_CENTER): Payer: 59 | Admitting: Internal Medicine

## 2015-04-12 ENCOUNTER — Encounter: Payer: Self-pay | Admitting: Internal Medicine

## 2015-04-12 VITALS — BP 123/79 | HR 91 | Temp 95.8°F | Resp 20 | Ht 65.0 in | Wt 177.0 lb

## 2015-04-12 DIAGNOSIS — D122 Benign neoplasm of ascending colon: Secondary | ICD-10-CM

## 2015-04-12 DIAGNOSIS — Z86010 Personal history of colonic polyps: Secondary | ICD-10-CM | POA: Diagnosis not present

## 2015-04-12 DIAGNOSIS — D125 Benign neoplasm of sigmoid colon: Secondary | ICD-10-CM | POA: Diagnosis not present

## 2015-04-12 HISTORY — PX: COLONOSCOPY: SHX174

## 2015-04-12 MED ORDER — SODIUM CHLORIDE 0.9 % IV SOLN: 500.0000 mL | INTRAVENOUS | Status: DC

## 2015-04-12 NOTE — Progress Notes (Signed)
Called to room to assist during endoscopic procedure.  Patient ID and intended procedure confirmed with present staff. Received instructions for my participation in the procedure from the performing physician.  

## 2015-04-12 NOTE — Op Note (Signed)
[place]  [employer]. [place], [id number]   COLONOSCOPY PROCEDURE REPORT  PATIENT: Clifford [name], Clifford [name]  MR#: [id number] BIRTHDATE: August 01, 1945 , 57  yrs. old GENDER: male ENDOSCOPIST: Gatha Mayer, MD, [facility] PROCEDURE DATE:  04/12/2015 PROCEDURE:   Colonoscopy, surveillance , Colonoscopy with biopsy, and Colonoscopy with snare polypectomy First Screening Colonoscopy - Avg.  risk and is 50 yrs.  old or older - No.  Prior Negative Screening - Now for repeat screening. N/A  History of Adenoma - Now for follow-up colonoscopy & has been > or = to 3 yrs.  Yes hx of adenoma.  Has been 3 or more years since last colonoscopy.  Polyps removed today? Yes ASA CLASS:   Class III INDICATIONS:Surveillance due to prior colonic neoplasia and PH Colon Adenoma. MEDICATIONS: Propofol 200 mg IV and Monitored anesthesia care  DESCRIPTION OF PROCEDURE:   After the risks benefits and alternatives of the procedure were thoroughly explained, informed consent was obtained.  The digital rectal exam revealed no rectal mass and revealed a surgically absent prostate.   The LB [id number]  endoscope was introduced through the anus and advanced to the cecum, which was identified by both the appendix and ileocecal valve. No adverse events experienced.   The quality of the prep was good.  (MiraLax was used)  The instrument was then slowly withdrawn as the colon was fully examined. Estimated blood loss is zero unless otherwise noted in this procedure report.  COLON FINDINGS: A polypoid shaped sessile polyp measuring 7 mm in size was found in the sigmoid colon.  A polypectomy was performed with a cold snare.  The resection was complete, the polyp tissue was completely retrieved and sent to histology.   A polypoid shaped sessile polyp measuring 2 mm in size was found in the ascending colon.  A polypectomy was performed with cold forceps.  The resection was complete, the polyp tissue was  completely retrieved and sent to histology.   The examination was otherwise normal. Retroflexed views revealed no abnormalities. The time to cecum = 2.0 Withdrawal time = 10.2   The scope was withdrawn and the procedure completed. COMPLICATIONS: There were no immediate complications.  ENDOSCOPIC IMPRESSION: 1.   Sessile polyp was found in the sigmoid colon; polypectomy was performed with a cold snare 2.   Sessile polyp was found in the ascending colon; polypectomy was performed with cold forceps 3.   The examination was otherwise normal - good prep - hx 4 adenomas max 12 mm 2013  RECOMMENDATIONS: Timing of repeat colonoscopy will be determined by pathology findings.  Likely 5 yrs  eSigned:  Gatha Mayer, MD, [facility] 04/12/2015 8:51 AM   cc: The Patient

## 2015-04-12 NOTE — Patient Instructions (Signed)
YOU HAD AN ENDOSCOPIC PROCEDURE TODAY AT [facility]:   Refer to the procedure report that was given to you for any specific questions about what was found during the examination.  If the procedure report does not answer your questions, please call your gastroenterologist to clarify.  If you requested that your care partner not be given the details of your procedure findings, then the procedure report has been included in a sealed envelope for you to review at your convenience later.  YOU SHOULD EXPECT: Some feelings of bloating in the abdomen. Passage of more gas than usual.  Walking can help get rid of the air that was put into your GI tract during the procedure and reduce the bloating. If you had a lower endoscopy (such as a colonoscopy or flexible sigmoidoscopy) you may notice spotting of blood in your stool or on the toilet paper. If you underwent a bowel prep for your procedure, you may not have a normal bowel movement for a few days.  Please Note:  You might notice some irritation and congestion in your nose or some drainage.  This is from the oxygen used during your procedure.  There is no need for concern and it should clear up in a day or so.  SYMPTOMS TO REPORT IMMEDIATELY:   Following lower endoscopy (colonoscopy or flexible sigmoidoscopy):  Excessive amounts of blood in the stool  Significant tenderness or worsening of abdominal pains  Swelling of the abdomen that is new, acute  Fever of 100F or higher   For urgent or emergent issues, a gastroenterologist can be reached at any hour by calling [phone].   DIET: Your first meal following the procedure should be a small meal and then it is ok to progress to your normal diet. Heavy or fried foods are harder to digest and may make you feel nauseous or bloated.  Likewise, meals heavy in dairy and vegetables can increase bloating.  Drink plenty of fluids but you should avoid alcoholic beverages for 24  hours.  ACTIVITY:  You should plan to take it easy for the rest of today and you should NOT DRIVE or use heavy machinery until tomorrow (because of the sedation medicines used during the test).    FOLLOW UP: Our staff will call the number listed on your records the next business day following your procedure to check on you and address any questions or concerns that you may have regarding the information given to you following your procedure. If we do not reach you, we will leave a message.  However, if you are feeling well and you are not experiencing any problems, there is no need to return our call.  We will assume that you have returned to your regular daily activities without incident.  If any biopsies were taken you will be contacted by phone or by letter within the next 1-3 weeks.  Please call us at [phone] if you have not heard about the biopsies in 3 weeks.    SIGNATURES/CONFIDENTIALITY: You and/or your care partner have signed paperwork which will be entered into your electronic medical record.  These signatures attest to the fact that that the information above on your After Visit Summary has been reviewed and is understood.  Full responsibility of the confidentiality of this discharge information lies with you and/or your care-partner.  Polyp information given.  Most likely return in 5 years.

## 2015-04-12 NOTE — Progress Notes (Signed)
Patient awakening,vss,report to rn 

## 2015-04-13 ENCOUNTER — Telehealth: Payer: Self-pay

## 2015-04-13 NOTE — Telephone Encounter (Signed)
  Follow up Call-  Call back number 04/12/2015  Post procedure Call Back phone  # [phone]  Permission to leave phone message Yes     Patient was called for follow up after procedure on 04/12/2015. No answer at the number given for follow up phone call. A message was left on the answering machine.

## 2015-04-20 ENCOUNTER — Encounter: Payer: Self-pay | Admitting: Internal Medicine

## 2015-04-20 DIAGNOSIS — Z86010 Personal history of colonic polyps: Secondary | ICD-10-CM

## 2015-04-20 NOTE — Progress Notes (Signed)
Quick Note:  2 adenomas Recall 2022 ______

## 2015-07-02 ENCOUNTER — Encounter (HOSPITAL_COMMUNITY): Payer: Self-pay | Admitting: Emergency Medicine

## 2015-07-02 ENCOUNTER — Inpatient Hospital Stay (HOSPITAL_COMMUNITY)
Admission: EM | Admit: 2015-07-02 | Discharge: 2015-07-14 | DRG: 234 | Disposition: A | Discharge status: Home / Self Care | Payer: 59 | Attending: Cardiothoracic Surgery | Admitting: Cardiothoracic Surgery

## 2015-07-02 ENCOUNTER — Emergency Department (HOSPITAL_COMMUNITY): Payer: 59

## 2015-07-02 DIAGNOSIS — K5900 Constipation, unspecified: Secondary | ICD-10-CM | POA: Diagnosis not present

## 2015-07-02 DIAGNOSIS — Z951 Presence of aortocoronary bypass graft: Secondary | ICD-10-CM

## 2015-07-02 DIAGNOSIS — Z8546 Personal history of malignant neoplasm of prostate: Secondary | ICD-10-CM

## 2015-07-02 DIAGNOSIS — Z87891 Personal history of nicotine dependence: Secondary | ICD-10-CM

## 2015-07-02 DIAGNOSIS — Z09 Encounter for follow-up examination after completed treatment for conditions other than malignant neoplasm: Secondary | ICD-10-CM

## 2015-07-02 DIAGNOSIS — E119 Type 2 diabetes mellitus without complications: Secondary | ICD-10-CM | POA: Diagnosis present

## 2015-07-02 DIAGNOSIS — I119 Hypertensive heart disease without heart failure: Secondary | ICD-10-CM | POA: Diagnosis present

## 2015-07-02 DIAGNOSIS — I10 Essential (primary) hypertension: Secondary | ICD-10-CM | POA: Diagnosis not present

## 2015-07-02 DIAGNOSIS — E8770 Fluid overload, unspecified: Secondary | ICD-10-CM | POA: Diagnosis not present

## 2015-07-02 DIAGNOSIS — R9431 Abnormal electrocardiogram [ECG] [EKG]: Secondary | ICD-10-CM

## 2015-07-02 DIAGNOSIS — Z833 Family history of diabetes mellitus: Secondary | ICD-10-CM | POA: Diagnosis not present

## 2015-07-02 DIAGNOSIS — I110 Hypertensive heart disease with heart failure: Secondary | ICD-10-CM | POA: Diagnosis not present

## 2015-07-02 DIAGNOSIS — D62 Acute posthemorrhagic anemia: Secondary | ICD-10-CM | POA: Diagnosis present

## 2015-07-02 DIAGNOSIS — R32 Unspecified urinary incontinence: Secondary | ICD-10-CM | POA: Diagnosis not present

## 2015-07-02 DIAGNOSIS — I2584 Coronary atherosclerosis due to calcified coronary lesion: Secondary | ICD-10-CM | POA: Diagnosis present

## 2015-07-02 DIAGNOSIS — I2510 Atherosclerotic heart disease of native coronary artery without angina pectoris: Secondary | ICD-10-CM | POA: Diagnosis present

## 2015-07-02 DIAGNOSIS — Z7982 Long term (current) use of aspirin: Secondary | ICD-10-CM | POA: Diagnosis not present

## 2015-07-02 DIAGNOSIS — Z91018 Allergy to other foods: Secondary | ICD-10-CM | POA: Diagnosis not present

## 2015-07-02 DIAGNOSIS — I25110 Atherosclerotic heart disease of native coronary artery with unstable angina pectoris: Principal | ICD-10-CM | POA: Diagnosis present

## 2015-07-02 DIAGNOSIS — E1169 Type 2 diabetes mellitus with other specified complication: Secondary | ICD-10-CM

## 2015-07-02 DIAGNOSIS — N320 Bladder-neck obstruction: Secondary | ICD-10-CM | POA: Diagnosis present

## 2015-07-02 DIAGNOSIS — Z9079 Acquired absence of other genital organ(s): Secondary | ICD-10-CM | POA: Diagnosis not present

## 2015-07-02 DIAGNOSIS — E7800 Pure hypercholesterolemia, unspecified: Secondary | ICD-10-CM | POA: Diagnosis present

## 2015-07-02 DIAGNOSIS — R609 Edema, unspecified: Secondary | ICD-10-CM | POA: Diagnosis not present

## 2015-07-02 DIAGNOSIS — I252 Old myocardial infarction: Secondary | ICD-10-CM

## 2015-07-02 DIAGNOSIS — Z8249 Family history of ischemic heart disease and other diseases of the circulatory system: Secondary | ICD-10-CM

## 2015-07-02 DIAGNOSIS — R072 Precordial pain: Secondary | ICD-10-CM | POA: Diagnosis present

## 2015-07-02 DIAGNOSIS — E118 Type 2 diabetes mellitus with unspecified complications: Secondary | ICD-10-CM

## 2015-07-02 DIAGNOSIS — I200 Unstable angina: Secondary | ICD-10-CM

## 2015-07-02 DIAGNOSIS — E785 Hyperlipidemia, unspecified: Secondary | ICD-10-CM | POA: Diagnosis not present

## 2015-07-02 DIAGNOSIS — Z955 Presence of coronary angioplasty implant and graft: Secondary | ICD-10-CM

## 2015-07-02 DIAGNOSIS — Z9689 Presence of other specified functional implants: Secondary | ICD-10-CM

## 2015-07-02 DIAGNOSIS — R079 Chest pain, unspecified: Secondary | ICD-10-CM

## 2015-07-02 LAB — I-STAT TROPONIN, ED
TROPONIN I, POC: 0.01 ng/mL (ref 0.00–0.08)
TROPONIN I, POC: 0.01 ng/mL (ref 0.00–0.08)

## 2015-07-02 LAB — BASIC METABOLIC PANEL
Anion gap: 14 (ref 5–15)
BUN: 16 mg/dL (ref 6–20)
CHLORIDE: 101 mmol/L (ref 101–111)
CO2: 22 mmol/L (ref 22–32)
Calcium: 9.3 mg/dL (ref 8.9–10.3)
Creatinine, Ser: 1.21 mg/dL (ref 0.61–1.24)
GFR calc Af Amer: >60 mL/min (ref >60)
GFR calc non Af Amer: 58 mL/min — ABNORMAL LOW (ref >60)
Glucose, Bld: 183 mg/dL — ABNORMAL HIGH (ref 65–99)
Potassium: 4.4 mmol/L (ref 3.5–5.1)
SODIUM: 137 mmol/L (ref 135–145)

## 2015-07-02 LAB — CBC
HCT: 44.3 % (ref 39.0–52.0)
HEMOGLOBIN: 15.5 g/dL (ref 13.0–17.0)
MCH: 32.3 pg (ref 26.0–34.0)
MCHC: 35 g/dL (ref 30.0–36.0)
MCV: 92.3 fL (ref 78.0–100.0)
PLATELETS: 245 10*3/uL (ref 150–400)
RBC: 4.8 MIL/uL (ref 4.22–5.81)
RDW: 12.7 % (ref 11.5–15.5)
WBC: 10.4 10*3/uL (ref 4.0–10.5)

## 2015-07-02 LAB — MAGNESIUM: Magnesium: 1.8 mg/dL (ref 1.7–2.4)

## 2015-07-02 LAB — GLUCOSE, CAPILLARY: Glucose-Capillary: 128 mg/dL — ABNORMAL HIGH (ref 65–99)

## 2015-07-02 LAB — TROPONIN I
TROPONIN I: 0.03 ng/mL (ref <0.031)
Troponin I: 0.03 ng/mL (ref <0.031)

## 2015-07-02 LAB — PHOSPHORUS: PHOSPHORUS: 3.5 mg/dL (ref 2.5–4.6)

## 2015-07-02 LAB — HEPARIN LEVEL (UNFRACTIONATED): Heparin Unfractionated: 0.41 IU/mL (ref 0.30–0.70)

## 2015-07-02 LAB — MRSA PCR SCREENING: MRSA by PCR: NEGATIVE

## 2015-07-02 SURGERY — Surgical Case
Anesthesia: *Unknown

## 2015-07-02 MED ORDER — SODIUM CHLORIDE 0.9% FLUSH: 3.0000 mL | INTRAVENOUS | Status: DC | PRN

## 2015-07-02 MED ORDER — LISINOPRIL 10 MG PO TABS
5.0000 mg | ORAL_TABLET | Freq: Every day | ORAL | Status: DC
  Administered 2015-07-02 – 2015-07-07 (×6): 5 mg via ORAL
  Filled 2015-07-02 (×6): qty 1

## 2015-07-02 MED ORDER — EZETIMIBE-SIMVASTATIN 10-80 MG PO TABS
1.0000 | ORAL_TABLET | Freq: Every day | ORAL | Status: DC
  Administered 2015-07-02 – 2015-07-14 (×12): 1 via ORAL
  Filled 2015-07-02 (×13): qty 1

## 2015-07-02 MED ORDER — SODIUM CHLORIDE 0.9 % IV SOLN
250.0000 mL | INTRAVENOUS | Status: DC | PRN
  Administered 2015-07-08: 13:00:00 via INTRAVENOUS

## 2015-07-02 MED ORDER — HEPARIN BOLUS VIA INFUSION
4000.0000 [IU] | Freq: Once | INTRAVENOUS | Status: AC
  Administered 2015-07-02: 4000 [IU] via INTRAVENOUS
  Filled 2015-07-02: qty 4000

## 2015-07-02 MED ORDER — SODIUM CHLORIDE 0.9 % WEIGHT BASED INFUSION
1.0000 mL/kg/h | INTRAVENOUS | Status: DC
  Administered 2015-07-04: 1 mL/kg/h via INTRAVENOUS

## 2015-07-02 MED ORDER — ASPIRIN EC 81 MG PO TBEC: 324.0000 mg | DELAYED_RELEASE_TABLET | Freq: Once | ORAL | Status: DC

## 2015-07-02 MED ORDER — HEPARIN (PORCINE) IN NACL 100-0.45 UNIT/ML-% IJ SOLN
950.0000 [IU]/h | INTRAMUSCULAR | Status: DC
  Administered 2015-07-02 – 2015-07-04 (×3): 950 [IU]/h via INTRAVENOUS
  Filled 2015-07-02 (×3): qty 250

## 2015-07-02 MED ORDER — SODIUM CHLORIDE 0.9 % WEIGHT BASED INFUSION
3.0000 mL/kg/h | INTRAVENOUS | Status: DC
  Administered 2015-07-04: 3 mL/kg/h via INTRAVENOUS

## 2015-07-02 MED ORDER — NITROGLYCERIN 0.4 MG SL SUBL
0.4000 mg | SUBLINGUAL_TABLET | SUBLINGUAL | Status: DC | PRN
  Administered 2015-07-02 – 2015-07-03 (×5): 0.4 mg via SUBLINGUAL
  Filled 2015-07-02 (×5): qty 1

## 2015-07-02 MED ORDER — ONDANSETRON HCL 4 MG/2ML IJ SOLN: 4.0000 mg | Freq: Four times a day (QID) | INTRAMUSCULAR | Status: DC | PRN

## 2015-07-02 MED ORDER — NITROGLYCERIN 2 % TD OINT
1.0000 [in_us] | TOPICAL_OINTMENT | Freq: Once | TRANSDERMAL | Status: AC
  Administered 2015-07-02: 1 [in_us] via TOPICAL
  Filled 2015-07-02: qty 1

## 2015-07-02 MED ORDER — ADULT MULTIVITAMIN W/MINERALS CH
1.0000 | ORAL_TABLET | Freq: Every day | ORAL | Status: DC
  Administered 2015-07-02 – 2015-07-07 (×6): 1 via ORAL
  Filled 2015-07-02 (×6): qty 1

## 2015-07-02 MED ORDER — ACETAMINOPHEN 325 MG PO TABS: 650.0000 mg | ORAL_TABLET | ORAL | Status: DC | PRN

## 2015-07-02 MED ORDER — ASPIRIN 81 MG PO CHEW
81.0000 mg | CHEWABLE_TABLET | ORAL | Status: AC
  Administered 2015-07-04: 81 mg via ORAL
  Filled 2015-07-02: qty 1

## 2015-07-02 MED ORDER — HEPARIN SODIUM (PORCINE) 5000 UNIT/ML IJ SOLN: 60.0000 [IU]/kg | Freq: Once | INTRAMUSCULAR | Status: DC

## 2015-07-02 MED ORDER — SODIUM CHLORIDE 0.9% FLUSH
3.0000 mL | Freq: Two times a day (BID) | INTRAVENOUS | Status: DC
  Administered 2015-07-02 – 2015-07-04 (×3): 3 mL via INTRAVENOUS

## 2015-07-02 MED ORDER — SODIUM CHLORIDE 0.9 % IV SOLN: 250.0000 mL | INTRAVENOUS | Status: DC | PRN

## 2015-07-02 MED ORDER — SODIUM CHLORIDE 0.9% FLUSH
3.0000 mL | Freq: Two times a day (BID) | INTRAVENOUS | Status: DC
  Administered 2015-07-02 – 2015-07-07 (×6): 3 mL via INTRAVENOUS

## 2015-07-02 NOTE — ED Provider Notes (Signed)
CSN: [id number]     Arrival date & time 07/02/15  0607 History   First MD Initiated Contact with Patient 07/02/15 [phone]     Chief Complaint  Patient presents with  . Chest Pain     (Consider location/radiation/quality/duration/timing/severity/associated sxs/prior Treatment) Patient is a 70 y.o. male presenting with chest pain. The history is provided by the patient.  Chest Pain Pain location:  Substernal area Pain quality: aching and dull   Pain radiates to:  Does not radiate Pain radiates to the back: no   Pain severity:  Moderate Onset quality:  Gradual Duration:  1 day Timing:  Constant Progression:  Partially resolved Chronicity:  Recurrent Relieved by:  Nothing Worsened by:  Nothing tried Ineffective treatments:  None tried Associated symptoms: shortness of breath   Associated symptoms: no abdominal pain, no fever, no headache, no palpitations and not vomiting   Risk factors: coronary artery disease    70 yo M With a chief complaint of chest pain. This going on last night when he woke this morning it was much worse. Felt like her prior MI though is in a location atypical of his prior. Called 911 after having no resolution with his nitroglycerin. Was given nitroglycerin by EMS with improvement of his symptoms. Pain is almost gone. EMS was concerned for a possible ST elevation MI in code STEMI was initiated.  Past Medical History  Diagnosis Date  . Hyperlipidemia   . Hypertension   . History of detached retina repair 11-06-2006    [facility]  . CAD (coronary artery disease) 2005    Previous stents RCA and circ, Cath 2005 patient with 70% PDA  . History of prostate cancer 03/2005  . History of hematuria   . Cataract     replace 11/2012  . Diabetes mellitus     diet controlled   Past Surgical History  Procedure Laterality Date  . Prostatectomy  03/2005  . Ptca  2005  . Lipoma excision    . Wrist ganglion excision    . Hemorrhoid surgery      I&D of thrombosed  hemorrhoid  . Cardiac catheterization  05.31.2002    Initially the stenosis in the proximal to mid right coronary artery was estimated ar 95%. Following stenting, this improved to 0%. There was residual mild disease in the sostium of the proximal right coronary atrtery. Successful stentng of the proximal to mid right coronary artery with improvement in stent diameter narrowing from 95% to 0% Proc Date  07/29/2000  . Colonoscopy    . Eye surgery  11/24/2012    left eye cataract  . Eye surgery  12/22/2012    right eye cataract  . Cataract extraction w/ intraocular lens implant Bilateral OS Sept '14; OD Oct '14    Dr. Bing Plume  . Angioplasty      [phone]   Family History  Problem Relation Age of Onset  . Diabetes Sister   . Cancer Neg Hx     Colon  . Colon cancer Neg Hx   . Esophageal cancer Neg Hx   . Rectal cancer Neg Hx   . Stomach cancer Neg Hx   . CAD Father   . Colon polyps Sister    Social History  Substance Use Topics  . Smoking status: Former Smoker    Quit date: 02/27/1983  . Smokeless tobacco: Never Used  . Alcohol Use: No    Review of Systems  Constitutional: Negative for fever and chills.  HENT: Negative for  congestion and facial swelling.   Eyes: Negative for discharge and visual disturbance.  Respiratory: Positive for shortness of breath.   Cardiovascular: Positive for chest pain. Negative for palpitations.  Gastrointestinal: Negative for vomiting, abdominal pain and diarrhea.  Musculoskeletal: Negative for myalgias and arthralgias.  Skin: Negative for color change and rash.  Neurological: Negative for tremors, syncope and headaches.  Psychiatric/Behavioral: Negative for confusion and dysphoric mood.      Allergies  Cinnamon  Home Medications   Prior to Admission medications   Medication Sig Start Date End Date Taking? Authorizing Provider  aspirin 81 MG tablet Take 81 mg by mouth daily.      Historical Provider, MD  bisacodyl (DULCOLAX) 5 MG EC  tablet Take 5 mg by mouth once.    Historical Provider, MD  ezetimibe-simvastatin (VYTORIN) 10-80 MG tablet Take 1 tablet by mouth daily. 01/17/15   Rowe Clack, MD  lisinopril (PRINIVIL,ZESTRIL) 10 MG tablet Take 0.5 tablets (5 mg total) by mouth daily. 01/17/15   Rowe Clack, MD  Multiple Vitamin (MULTIVITAMIN) tablet Take 1 tablet by mouth daily.      Historical Provider, MD  mupirocin cream (BACTROBAN) 2 % Apply 1 application topically 2 (two) times daily. Patient not taking: Reported on 04/12/2015 01/17/15   Rowe Clack, MD   BP 123/82 mmHg  Pulse 78  Temp(Src) 97.8 F (36.6 C) (Oral)  Resp 12  SpO2 91% Physical Exam  Constitutional: He is oriented to person, place, and time. He appears well-developed and well-nourished.  HENT:  Head: Normocephalic and atraumatic.  Eyes: EOM are normal. Pupils are equal, round, and reactive to light.  Neck: Normal range of motion. Neck supple. No JVD present.  Cardiovascular: Normal rate and regular rhythm.  Exam reveals no gallop and no friction rub.   No murmur heard. Pulmonary/Chest: No respiratory distress. He has no wheezes.  Abdominal: He exhibits no distension. There is no tenderness. There is no rebound and no guarding.  Ventral hernia  Musculoskeletal: Normal range of motion.  Neurological: He is alert and oriented to person, place, and time.  Skin: No rash noted. No pallor.  Psychiatric: He has a normal mood and affect. His behavior is normal.  Nursing note and vitals reviewed.   ED Course  Procedures (including critical care time) Labs Review Labs Reviewed  [place], ED    Imaging Review No results found. I have personally reviewed and evaluated these images and lab results as part of my medical decision-making.   EKG Interpretation   Date/Time:  Saturday Jul 02 2015 06:12:43 EDT Ventricular Rate:  77 PR Interval:  158 QRS Duration:  84 QT Interval:  394 QTC Calculation: 445 R Axis:   -2 Text Interpretation:  Normal sinus rhythm Possible Inferior infarct , age  undetermined Abnormal ECG No significant change since last tracing  Confirmed by Seleena Reimers MD, DANIEL [id number]) on 07/02/2015 6:16:28 AM      MDM   Final diagnoses:  EKG, abnormal  Chest pain with high risk for cardiac etiology    70 yo M with a chief complaint of chest pain. Patient was made a code STEMI by EMS. On review of his prior EKGs was found to have a similar EKG in 2000. This was discussed with cardiology and code STEMI was canceled. CBC BMP troponin chest x-ray EKG ordered. Initial trop negative. ECG similar to last ecg in the system and different than EMS.  Cardiology admit.  The patients results and plan were reviewed and discussed.   Any x-rays performed were independently reviewed by myself.   Differential diagnosis were considered with the presenting HPI.  Medications  nitroGLYCERIN (NITROGLYN) 2 % ointment 1 inch (1 inch Topical Given 07/02/15 0632)    Filed Vitals:   07/02/15 0616 07/02/15 0630  BP: 128/81 123/82  Pulse: 77 78  Temp: 97.8 F (36.6 C)   TempSrc: Oral   Resp: 13 12  SpO2: 96% 91%    Final diagnoses:  EKG, abnormal  Chest pain with high risk for cardiac etiology    Admission/ observation were discussed with the admitting physician, patient and/or family and they are comfortable with the plan.      Deno Etienne, DO 07/02/15 [phone]

## 2015-07-02 NOTE — H&P (Signed)
Physician History and Physical     Patient ID: Clifford [name] MRN: [id number] DOB/AGE: 11/24/1945 70 y.o. Admit date: 07/02/2015  Primary Care Physician: Hoyt Koch, MD Primary Cardiologist: Johnsie Cancel  Active Problems:   Unstable angina [facility])   HPI:  70 y.o. with history  of CAD,HTN,elevated lipids. Admitted to ER with SSCP.  Initially STEMI called.  Seen by Dr Dinah Beers and fellow [place] this am and cancelled.  He has a distant history of CAD with PCI in 95, 2000 and 2002. Last cath in 2005 showed patent stents to the RCA and Circ wit h70% PDA disease in a small vessel. He has done well with medical therapy. His last myovue in 2008 showed small inferobasal MI with no ischemia.. He has a history of prostate CA and has regular F/U with Dr. Renee Ramus with an undetectable PSA  He started having pain in left side of chest yesterday Continued during evening and early morning hours. Took Beeno and passed some gas with partial relief but pain kept coming back.  EMS gave nitro and had another at [facility] 7:00am with relief. Currently pain free with negative troponin.  ECG with lateral J point elevation.   Review of systems complete and found to be negative unless listed above   Past Medical History  Diagnosis Date  . Hyperlipidemia   . Hypertension   . History of detached retina repair 11-06-2006    [facility]  . CAD (coronary artery disease) 2005    Previous stents RCA and circ, Cath 2005 patient with 70% PDA  . History of prostate cancer 03/2005  . History of hematuria   . Cataract     replace 11/2012  . Diabetes mellitus     diet controlled    Family History  Problem Relation Age of Onset  . Diabetes Sister   . Cancer Neg Hx     Colon  . Colon cancer Neg Hx   . Esophageal cancer Neg Hx   . Rectal cancer Neg Hx   . Stomach cancer Neg Hx   . CAD Father   . Colon polyps Sister     Social History   Social History  . Marital Status: Married    Spouse Name: N/A  . Number of  Children: N/A  . Years of Education: N/A   Occupational History  . Not on file.   Social History Main Topics  . Smoking status: Former Smoker    Quit date: 02/27/1983  . Smokeless tobacco: Never Used  . Alcohol Use: No  . Drug Use: No  . Sexual Activity:    Partners: Female   Other Topics Concern  . 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 ACP - discussed with patient and provided packet and referral to [email or web address] Nov '15. He already has stated that if he is :"comatose" pull the plug.     Past Surgical History  Procedure Laterality Date  . Prostatectomy  03/2005  . Ptca  2005  . Lipoma excision    . Wrist ganglion excision    . Hemorrhoid surgery      I&D of thrombosed hemorrhoid  . Cardiac catheterization  05.31.2002    Initially the stenosis in the proximal to mid right coronary artery was estimated ar 95%. Following stenting, this improved to 0%. There was residual mild disease in the  sostium of the proximal right coronary atrtery. Successful stentng of the proximal to mid right coronary artery with improvement in stent diameter narrowing from 95% to 0% Proc Date  07/29/2000  . Colonoscopy    . Eye surgery  11/24/2012    left eye cataract  . Eye surgery  12/22/2012    right eye cataract  . Cataract extraction w/ intraocular lens implant Bilateral OS Sept '14; OD Oct '14    Dr. Bing Plume  . Angioplasty      [phone]      (Not in a hospital admission)  Physical Exam: Blood pressure 112/74, pulse 81, temperature 97.8 F (36.6 C), temperature source Oral, resp. rate 15, weight 78.472 kg (173 lb), SpO2 94 %.    Affect appropriate Healthy:  appears stated age 70: normal Neck supple with no adenopathy JVP normal no bruits no thyromegaly Lungs clear with no wheezing and good diaphragmatic motion Heart:  S1/S2 no  murmur, no rub, gallop or click PMI normal Abdomen: benighn, BS positve, no tenderness, no AAA no bruit.  No HSM or HJR Distal pulses intact with no bruits No edema Neuro non-focal Skin warm and dry No muscular weakness  No current facility-administered medications on file prior to encounter.   Current Outpatient Prescriptions on File Prior to Encounter  Medication Sig Dispense Refill  . ezetimibe-simvastatin (VYTORIN) 10-80 MG tablet Take 1 tablet by mouth daily. 90 tablet 1  . lisinopril (PRINIVIL,ZESTRIL) 10 MG tablet Take 0.5 tablets (5 mg total) by mouth daily. 90 tablet 1  . Multiple Vitamin (MULTIVITAMIN) tablet Take 1 tablet by mouth daily.      . mupirocin cream (BACTROBAN) 2 % Apply 1 application topically 2 (two) times daily. (Patient not taking: Reported on 04/12/2015) 15 g 0    Labs:   Lab Results  Component Value Date   WBC 10.4 07/02/2015   HGB 15.5 07/02/2015   HCT 44.3 07/02/2015   MCV 92.3 07/02/2015   PLT 245 07/02/2015     Recent Labs Lab 07/02/15 0611  NA 137  K 4.4  CL 101  CO2 22  BUN 16  CREATININE 1.21  CALCIUM 9.3  GLUCOSE 183*     Lab Results  Component Value Date   CHOL 140 01/17/2015   CHOL 128 01/14/2014   CHOL 163 12/30/2012   Lab Results  Component Value Date   HDL 42.00 01/17/2015   HDL 39.60 01/14/2014   HDL 44.10 12/30/2012   Lab Results  Component Value Date   LDLCALC 68 01/17/2015   LDLCALC 61 01/14/2014   LDLCALC 84 12/30/2012   Lab Results  Component Value Date   TRIG 149.0 01/17/2015   TRIG 136.0 01/14/2014   TRIG 175.0* 12/30/2012   Lab Results  Component Value Date   CHOLHDL 3 01/17/2015   CHOLHDL 3 01/14/2014   CHOLHDL 4 12/30/2012   No results found for: LDLDIRECT     Radiology: Dg Chest 2 View  07/02/2015  CLINICAL DATA:  Midsternal chest pressure with onset last night. Sweating. Now pain radiates to the left side. History of diabetes and hypertension. EXAM: CHEST  2 VIEW COMPARISON:  04/12/2005  FINDINGS: Shallow inspiration with linear atelectasis or fibrosis in the lung bases. Normal heart size and pulmonary vascularity. No focal airspace disease or consolidation in the lungs. No blunting of costophrenic angles. No pneumothorax. Calcified and tortuous aorta. Degenerative changes in the spine. IMPRESSION: Shallow inspiration with linear atelectasis or fibrosis in the lung bases similar to previous study.  No evidence of active pulmonary disease. Electronically Signed   By: Lucienne Capers M.D.   On: 07/02/2015 06:55    EKG: NSR lateral J point elevation  ASSESSMENT AND PLAN:   Angina:  History of CAD been stable for a long time. Recurrent pains responsive to nitro. Agree with CM no acute ECG changes and troponin negative. That being said he will need cath on Monday or sooner if troponin turns positive Or he has new ECG changes with pain.  Start heparin continue start metoprolol 12.5 bid.  Admit to unit BP a little soft for iv nitro but has nitro paste on left shoulder  Chol:  Continue vytorin fasting lipids in am  Prostate:  Stable f/u PSA  Signed: Collier Salina Nishan5/07/2015, 9:00 AM

## 2015-07-02 NOTE — Progress Notes (Addendum)
[place] for Heparin Indication: chest pain/ACS  Allergies  Allergen Reactions  . Cinnamon Other (See Comments)    Stomach ache     Patient Measurements: Height: 5\' 4"  (162.6 cm) Weight: 174 lb 2.6 oz (79 kg) IBW/kg (Calculated) : 59.2 IBW 62kg Actual wt (per pt) 79kg Heparin Dosing Weight: 77kg  Vital Signs: Temp: 97.7 F (36.5 C) (05/06 1236) Temp Source: Oral (05/06 1236) BP: 114/72 mmHg (05/06 1600) Pulse Rate: 83 (05/06 1236)  Labs:  Recent Labs  07/02/15 0611 07/02/15 1213 07/02/15 1606  HGB 15.5  --   --   HCT 44.3  --   --   PLT 245  --   --   HEPARINUNFRC  --   --  0.41  CREATININE 1.21  --   --   TROPONINI  --  0.03  --     Estimated Creatinine Clearance: 52.4 mL/min (by C-G formula based on Cr of 1.21).   Medical History: Past Medical History  Diagnosis Date  . Hyperlipidemia   . Hypertension   . History of detached retina repair 11-06-2006    [facility]  . CAD (coronary artery disease) 2005    Previous stents RCA and circ, Cath 2005 patient with 70% PDA  . History of prostate cancer 03/2005  . History of hematuria   . Cataract     replace 11/2012  . Diabetes mellitus     diet controlled  . Anginal pain (HCC)     Medications:  Scheduled:  . [START ON 07/03/2015] aspirin  81 mg Oral Pre-Cath  . aspirin EC  324 mg Oral Once  . ezetimibe-simvastatin  1 tablet Oral Daily  . lisinopril  5 mg Oral Daily  . multivitamin with minerals  1 tablet Oral Daily  . sodium chloride flush  3 mL Intravenous Q12H  . sodium chloride flush  3 mL Intravenous Q12H    Assessment: 70yo male with chest pain, to start heparin.  He is on no anticoagulation prior to admission. Hg and pltc wnl.  Initial HL therapeutic (0.41) on 950 units/h. No bleed documented.  Goal of Therapy:  Heparin level 0.3-0.7 units/ml Monitor platelets by anticoagulation protocol: Yes   Plan:  Heparin at 950 units/h Check heparin level in  8hr to confirm Watch for s/s of bleeding Daily HL, CBC   Elicia Lamp, PharmD, [facility] Clinical Pharmacist Pager [phone] 07/02/2015 5:23 PM

## 2015-07-02 NOTE — Progress Notes (Signed)
ANTICOAGULATION CONSULT NOTE - Initial Consult  Pharmacy Consult for Heparin Indication: chest pain/ACS  Allergies  Allergen Reactions  . Cinnamon Other (See Comments)    Stomach ache     Patient Measurements:   IBW 62kg Actual wt (per pt) 79kg Heparin Dosing Weight: 77kg  Vital Signs: Temp: 97.8 F (36.6 C) (05/06 0616) Temp Source: Oral (05/06 0616) BP: 112/74 mmHg (05/06 0815) Pulse Rate: 79 (05/06 0815)  Labs:  Recent Labs  07/02/15 0611  HGB 15.5  HCT 44.3  PLT 245  CREATININE 1.21    CrCl cannot be calculated (Unknown ideal weight.).   Medical History: Past Medical History  Diagnosis Date  . Hyperlipidemia   . Hypertension   . History of detached retina repair 11-06-2006    [facility]  . CAD (coronary artery disease) 2005    Previous stents RCA and circ, Cath 2005 patient with 70% PDA  . History of prostate cancer 03/2005  . History of hematuria   . Cataract     replace 11/2012  . Diabetes mellitus     diet controlled    Medications:  Scheduled:  . heparin  60 Units/kg Intravenous Once    Assessment: 70yo male with chest pain, to start heparin.  He is on no anticoagulation prior to admission.  Heparin bolus has been ordered, but not given.  Hg and pltc wnl.  Goal of Therapy:  Heparin level 0.3-0.7 units/ml Monitor platelets by anticoagulation protocol: Yes   Plan:  Heparin 4000 units IV x 1, then 950 units/hr Check heparin level in 6hr Watch for s/s of bleeding Daily HL, CBC  Gracy Bruins, [facility]

## 2015-07-02 NOTE — ED Notes (Addendum)
Cardiology MD Nishan at bedside.

## 2015-07-02 NOTE — ED Notes (Signed)
Pt. States he woke up at 0430 with sharp midsternum chest pain. Pt. States it felt as if he needed to use the restroom, but could not and was very sweaty. Pt. Took 4 baby aspirin prior to EMS arrival. Pt. Was given 2 sublingual nitro's in route to hospital. Pt. States he pain went away after the 2nd nitro.

## 2015-07-02 NOTE — Progress Notes (Signed)
Pt received from the ed via stretcher in no apparent distress, pt placed on tele monitor, vss, 2 L [place], no c/o of pain, Heparin infusing thru 20G left ac site wnl, md notified of pt arrival, will continue to monitor.

## 2015-07-03 DIAGNOSIS — E119 Type 2 diabetes mellitus without complications: Secondary | ICD-10-CM | POA: Insufficient documentation

## 2015-07-03 DIAGNOSIS — I119 Hypertensive heart disease without heart failure: Secondary | ICD-10-CM

## 2015-07-03 DIAGNOSIS — E785 Hyperlipidemia, unspecified: Secondary | ICD-10-CM

## 2015-07-03 DIAGNOSIS — E1169 Type 2 diabetes mellitus with other specified complication: Secondary | ICD-10-CM

## 2015-07-03 LAB — HEPARIN LEVEL (UNFRACTIONATED): Heparin Unfractionated: 0.54 IU/mL (ref 0.30–0.70)

## 2015-07-03 LAB — BASIC METABOLIC PANEL
ANION GAP: 10 (ref 5–15)
BUN: 19 mg/dL (ref 6–20)
CHLORIDE: 104 mmol/L (ref 101–111)
CO2: 21 mmol/L — ABNORMAL LOW (ref 22–32)
Calcium: 9.4 mg/dL (ref 8.9–10.3)
Creatinine, Ser: 1.24 mg/dL (ref 0.61–1.24)
GFR calc Af Amer: >60 mL/min (ref >60)
GFR calc non Af Amer: 56 mL/min — ABNORMAL LOW (ref >60)
GLUCOSE: 209 mg/dL — AB (ref 65–99)
POTASSIUM: 4.3 mmol/L (ref 3.5–5.1)
Sodium: 135 mmol/L (ref 135–145)

## 2015-07-03 LAB — CBC
HCT: 43.9 % (ref 39.0–52.0)
Hemoglobin: 15.5 g/dL (ref 13.0–17.0)
MCH: 32.3 pg (ref 26.0–34.0)
MCHC: 35.3 g/dL (ref 30.0–36.0)
MCV: 91.5 fL (ref 78.0–100.0)
PLATELETS: 242 10*3/uL (ref 150–400)
RBC: 4.8 MIL/uL (ref 4.22–5.81)
RDW: 12.8 % (ref 11.5–15.5)
WBC: 12.8 10*3/uL — AB (ref 4.0–10.5)

## 2015-07-03 LAB — LIPID PANEL
CHOLESTEROL: 118 mg/dL (ref 0–200)
HDL: 43 mg/dL (ref >40)
LDL Cholesterol: 51 mg/dL (ref 0–99)
TRIGLYCERIDES: 120 mg/dL (ref <150)
Total CHOL/HDL Ratio: 2.7 RATIO
VLDL: 24 mg/dL (ref 0–40)

## 2015-07-03 LAB — TROPONIN I
Troponin I: <0.03 ng/mL (ref <0.031)
Troponin I: <0.03 ng/mL (ref <0.031)

## 2015-07-03 MED ORDER — NITROGLYCERIN IN D5W 200-5 MCG/ML-% IV SOLN: 0.0000 ug/min | INTRAVENOUS | Status: DC

## 2015-07-03 MED ORDER — NITROGLYCERIN IN D5W 200-5 MCG/ML-% IV SOLN
INTRAVENOUS | Status: AC
  Administered 2015-07-03: 5 ug
  Filled 2015-07-03: qty 250

## 2015-07-03 MED ORDER — CLOPIDOGREL BISULFATE 300 MG PO TABS
300.0000 mg | ORAL_TABLET | Freq: Once | ORAL | Status: AC
  Administered 2015-07-03: 300 mg via ORAL
  Filled 2015-07-03: qty 1

## 2015-07-03 MED ORDER — CETYLPYRIDINIUM CHLORIDE 0.05 % MT LIQD
7.0000 mL | Freq: Two times a day (BID) | OROMUCOSAL | Status: DC
  Administered 2015-07-03 – 2015-07-04 (×2): 7 mL via OROMUCOSAL

## 2015-07-03 MED ORDER — CLOPIDOGREL BISULFATE 75 MG PO TABS
75.0000 mg | ORAL_TABLET | Freq: Every day | ORAL | Status: DC
  Administered 2015-07-04: 75 mg via ORAL
  Filled 2015-07-03: qty 1

## 2015-07-03 MED ORDER — ALPRAZOLAM 0.25 MG PO TABS
0.2500 mg | ORAL_TABLET | ORAL | Status: DC | PRN
  Administered 2015-07-04: 0.25 mg via ORAL
  Filled 2015-07-03: qty 1

## 2015-07-03 MED ORDER — GI COCKTAIL ~~LOC~~: 30.0000 mL | Freq: Once | ORAL | Status: DC

## 2015-07-03 MED ORDER — ALUM & MAG HYDROXIDE-SIMETH 200-200-20 MG/5ML PO SUSP
30.0000 mL | ORAL | Status: DC | PRN
  Administered 2015-07-03 – 2015-07-04 (×3): 30 mL via ORAL
  Filled 2015-07-03 (×3): qty 30

## 2015-07-03 NOTE — Progress Notes (Signed)
Patient ID: Clifford [name], male   DOB: [birth year], 70 y.o.   MRN: [id number]    Subjective:  Patient continues to have active angina despite iv heparin Given nitro multiple times No ECG changes and so far enzymes negative   Objective:  Filed Vitals:   07/03/15 0600 07/03/15 0700 07/03/15 0800 07/03/15 0822  BP: 137/95 137/89 131/97   Pulse: 85 86 79 97  Temp:      TempSrc:      Resp: 16 18 17 14   Height:      Weight:      SpO2: 97% 97% 98% 100%    Intake/Output from previous day:  Intake/Output Summary (Last 24 hours) at 07/03/15 0835 Last data filed at 07/03/15 0600  Gross per 24 hour  Intake 1075.23 ml  Output    827 ml  Net 248.23 ml    Physical Exam: Affect appropriate Healthy:  appears stated age HEENT: normal Neck supple with no adenopathy JVP normal no bruits no thyromegaly Lungs clear with no wheezing and good diaphragmatic motion Heart:  S1/S2 no murmur, no rub, gallop or click PMI normal Abdomen: benighn, BS positve, no tenderness, no AAA no bruit.  No HSM or HJR Distal pulses intact with no bruits No edema Neuro non-focal Skin warm and dry No muscular weakness   Lab Results: Basic Metabolic Panel:  Recent Labs  07/02/15 0611 07/03/15 0255  NA 137 135  K 4.4 4.3  CL 101 104  CO2 22 21*  GLUCOSE 183* 209*  BUN 16 19  CREATININE 1.21 1.24  CALCIUM 9.3 9.4  MG 1.8  --   PHOS 3.5  --    CBC:  Recent Labs  07/02/15 0611 07/03/15 0255  WBC 10.4 12.8*  HGB 15.5 15.5  HCT 44.3 43.9  MCV 92.3 91.5  PLT 245 242   Cardiac Enzymes:  Recent Labs  07/02/15 1606 07/02/15 2340 07/03/15 0255  TROPONINI 0.03 <0.03 <0.03   BNP: Invalid input(s): POCBNP D-Dimer: No results for input(s): DDIMER in the last 72 hours. Hemoglobin A1C: No results for input(s): HGBA1C in the last 72 hours. Fasting Lipid Panel:  Recent Labs  07/03/15 0255  CHOL 118  HDL 43  LDLCALC 51  TRIG 120  CHOLHDL 2.7   Thyroid Function Tests: No results  for input(s): TSH, T4TOTAL, T3FREE, THYROIDAB in the last 72 hours.  Invalid input(s): FREET3 Anemia Panel: No results for input(s): VITAMINB12, FOLATE, FERRITIN, TIBC, IRON, RETICCTPCT in the last 72 hours.  Imaging: Dg Chest 2 View  07/02/2015  CLINICAL DATA:  Midsternal chest pressure with onset last night. Sweating. Now pain radiates to the left side. History of diabetes and hypertension. EXAM: CHEST  2 VIEW COMPARISON:  04/12/2005 FINDINGS: Shallow inspiration with linear atelectasis or fibrosis in the lung bases. Normal heart size and pulmonary vascularity. No focal airspace disease or consolidation in the lungs. No blunting of costophrenic angles. No pneumothorax. Calcified and tortuous aorta. Degenerative changes in the spine. IMPRESSION: Shallow inspiration with linear atelectasis or fibrosis in the lung bases similar to previous study. No evidence of active pulmonary disease. Electronically Signed   By: Lucienne Capers M.D.   On: 07/02/2015 06:55    Cardiac Studies:  ECG:  ST rate 18 old IMI   Telemetry:  SR/ST rate 90-110  Echo:   Medications:   . aspirin  81 mg Oral Pre-Cath  . aspirin EC  324 mg Oral Once  . clopidogrel  300 mg Oral Daily  . [  START ON 07/04/2015] clopidogrel  75 mg Oral Daily  . ezetimibe-simvastatin  1 tablet Oral Daily  . lisinopril  5 mg Oral Daily  . multivitamin with minerals  1 tablet Oral Daily  . nitroGLYCERIN      . sodium chloride flush  3 mL Intravenous Q12H  . sodium chloride flush  3 mL Intravenous Q12H     . sodium chloride    . heparin 950 Units/hr (07/03/15 0307)    Assessment/Plan:  Unstable Again:  Patient requiring ICU care for uncontrollable angina.  Initially called STEMI but cancelled by CM.  Lateral J point elevation better. Was pain free On heparin but now more angina this am requiring iv nitro.  ECG ok enzymes pending this am Increase beta blocker If enzymes turn positive will cath today Will also load with plavix.  On cath  board orders written.  He has a distant history of CAD with PCI in 95, 2000 and 2002. Last cath in 2005 showed patent stents to the RCA and Circ wit h70% PDA disease in a small vessel.  His last myovue in 2008 showed small inferobasal MI with no ischemia..   Discussed the fact that this patient is clearly not observational status with [place] 07/03/2015, 8:35 AM

## 2015-07-03 NOTE — Progress Notes (Signed)
[place] for Heparin Indication: chest pain/ACS  Allergies  Allergen Reactions  . Cinnamon Other (See Comments)    Stomach ache     Patient Measurements: Height: 5\' 4"  (162.6 cm) Weight: 174 lb 2.6 oz (79 kg) IBW/kg (Calculated) : 59.2 IBW 62kg Actual wt (per pt) 79kg Heparin Dosing Weight: 77kg  Vital Signs: Temp: 98 F (36.7 C) (05/06 2000) Temp Source: Oral (05/06 2000) BP: 124/89 mmHg (05/06 2200) Pulse Rate: 96 (05/06 2200)  Labs:  Recent Labs  07/02/15 0611 07/02/15 1213 07/02/15 1606 07/02/15 2340  HGB 15.5  --   --   --   HCT 44.3  --   --   --   PLT 245  --   --   --   HEPARINUNFRC  --   --  0.41 0.54  CREATININE 1.21  --   --   --   TROPONINI  --  0.03 0.03  --     Estimated Creatinine Clearance: 52.4 mL/min (by C-G formula based on Cr of 1.21).  Assessment: 70yo male on heparin for r/o ACS. Heparin level remains therapeutic on 950 units/hr. No bleeding noted.  Goal of Therapy:  Heparin level 0.3-0.7 units/ml Monitor platelets by anticoagulation protocol: Yes   Plan:  Continue heparin at 950 units/h Daily HL, CBC  Sherlon Handing, PharmD, BCPS Clinical pharmacist, pager [phone] 07/03/2015 12:11 AM

## 2015-07-04 ENCOUNTER — Encounter (HOSPITAL_COMMUNITY)
Admission: EM | Disposition: A | Discharge status: Home / Self Care | Payer: Self-pay | Source: Home / Self Care | Attending: Cardiovascular Disease

## 2015-07-04 DIAGNOSIS — I25110 Atherosclerotic heart disease of native coronary artery with unstable angina pectoris: Principal | ICD-10-CM

## 2015-07-04 DIAGNOSIS — I2510 Atherosclerotic heart disease of native coronary artery without angina pectoris: Secondary | ICD-10-CM

## 2015-07-04 DIAGNOSIS — E785 Hyperlipidemia, unspecified: Secondary | ICD-10-CM

## 2015-07-04 HISTORY — PX: CARDIAC CATHETERIZATION: SHX172

## 2015-07-04 LAB — CBC
HCT: 41.7 % (ref 39.0–52.0)
HEMOGLOBIN: 14 g/dL (ref 13.0–17.0)
MCH: 31.5 pg (ref 26.0–34.0)
MCHC: 33.6 g/dL (ref 30.0–36.0)
MCV: 93.9 fL (ref 78.0–100.0)
Platelets: 215 10*3/uL (ref 150–400)
RBC: 4.44 MIL/uL (ref 4.22–5.81)
RDW: 12.9 % (ref 11.5–15.5)
WBC: 8.5 10*3/uL (ref 4.0–10.5)

## 2015-07-04 LAB — BASIC METABOLIC PANEL
Anion gap: 11 (ref 5–15)
BUN: 18 mg/dL (ref 6–20)
CO2: 25 mmol/L (ref 22–32)
CREATININE: 1.24 mg/dL (ref 0.61–1.24)
Calcium: 9.2 mg/dL (ref 8.9–10.3)
Chloride: 101 mmol/L (ref 101–111)
GFR calc Af Amer: >60 mL/min (ref >60)
GFR, EST NON AFRICAN AMERICAN: 56 mL/min — AB (ref >60)
Glucose, Bld: 168 mg/dL — ABNORMAL HIGH (ref 65–99)
POTASSIUM: 4.5 mmol/L (ref 3.5–5.1)
SODIUM: 137 mmol/L (ref 135–145)

## 2015-07-04 LAB — POCT ACTIVATED CLOTTING TIME
ACTIVATED CLOTTING TIME: 219 s
Activated Clotting Time: 183 seconds
Activated Clotting Time: 193 seconds

## 2015-07-04 LAB — PROTIME-INR
INR: 0.99 (ref 0.00–1.49)
PROTHROMBIN TIME: 13.3 s (ref 11.6–15.2)

## 2015-07-04 LAB — GLUCOSE, CAPILLARY: Glucose-Capillary: 121 mg/dL — ABNORMAL HIGH (ref 65–99)

## 2015-07-04 LAB — HEPARIN LEVEL (UNFRACTIONATED): HEPARIN UNFRACTIONATED: 0.34 [IU]/mL (ref 0.30–0.70)

## 2015-07-04 SURGERY — LEFT HEART CATH AND CORONARY ANGIOGRAPHY
Anesthesia: LOCAL

## 2015-07-04 MED ORDER — IOPAMIDOL (ISOVUE-370) INJECTION 76%
INTRAVENOUS | Status: AC
  Filled 2015-07-04: qty 100

## 2015-07-04 MED ORDER — MORPHINE SULFATE (PF) 2 MG/ML IV SOLN
INTRAVENOUS | Status: AC
  Administered 2015-07-04: 2 mg via INTRAVENOUS
  Filled 2015-07-04: qty 1

## 2015-07-04 MED ORDER — SODIUM CHLORIDE 0.9 % IV SOLN
INTRAVENOUS | Status: DC | PRN
  Administered 2015-07-04: 1 mL via INTRAVENOUS

## 2015-07-04 MED ORDER — SODIUM CHLORIDE 0.9% FLUSH: 3.0000 mL | INTRAVENOUS | Status: DC | PRN

## 2015-07-04 MED ORDER — ACETAMINOPHEN 325 MG PO TABS: 650.0000 mg | ORAL_TABLET | ORAL | Status: DC | PRN

## 2015-07-04 MED ORDER — VERAPAMIL HCL 2.5 MG/ML IV SOLN
INTRAVENOUS | Status: AC
  Filled 2015-07-04: qty 2

## 2015-07-04 MED ORDER — SODIUM CHLORIDE 0.9% FLUSH
3.0000 mL | Freq: Two times a day (BID) | INTRAVENOUS | Status: DC
Start: 2015-07-04 — End: 2015-07-08
  Administered 2015-07-05 – 2015-07-07 (×3): 3 mL via INTRAVENOUS

## 2015-07-04 MED ORDER — MIDAZOLAM HCL 2 MG/2ML IJ SOLN
INTRAMUSCULAR | Status: DC | PRN
  Administered 2015-07-04: 1 mg via INTRAVENOUS
  Administered 2015-07-04: 2 mg via INTRAVENOUS

## 2015-07-04 MED ORDER — FENTANYL CITRATE (PF) 100 MCG/2ML IJ SOLN
INTRAMUSCULAR | Status: AC
  Filled 2015-07-04: qty 2

## 2015-07-04 MED ORDER — ONDANSETRON HCL 4 MG/2ML IJ SOLN: 4.0000 mg | Freq: Four times a day (QID) | INTRAMUSCULAR | Status: DC | PRN

## 2015-07-04 MED ORDER — LIDOCAINE HCL (PF) 1 % IJ SOLN
INTRAMUSCULAR | Status: AC
  Filled 2015-07-04: qty 30

## 2015-07-04 MED ORDER — HEPARIN (PORCINE) IN NACL 2-0.9 UNIT/ML-% IJ SOLN
INTRAMUSCULAR | Status: DC | PRN
  Administered 2015-07-04: 1000 mL

## 2015-07-04 MED ORDER — SODIUM CHLORIDE 0.9 % WEIGHT BASED INFUSION
1.0000 mL/kg/h | INTRAVENOUS | Status: DC
  Administered 2015-07-04: 1 mL/kg/h via INTRAVENOUS

## 2015-07-04 MED ORDER — HEPARIN SODIUM (PORCINE) 1000 UNIT/ML IJ SOLN
INTRAMUSCULAR | Status: DC | PRN
  Administered 2015-07-04: 4500 [IU] via INTRAVENOUS

## 2015-07-04 MED ORDER — IOPAMIDOL (ISOVUE-370) INJECTION 76%
INTRAVENOUS | Status: AC
  Filled 2015-07-04: qty 50

## 2015-07-04 MED ORDER — MIDAZOLAM HCL 2 MG/2ML IJ SOLN
INTRAMUSCULAR | Status: AC
  Filled 2015-07-04: qty 2

## 2015-07-04 MED ORDER — MORPHINE SULFATE (PF) 2 MG/ML IV SOLN
2.0000 mg | INTRAVENOUS | Status: DC | PRN
  Administered 2015-07-06: 2 mg via INTRAVENOUS
  Filled 2015-07-04 (×2): qty 1

## 2015-07-04 MED ORDER — VERAPAMIL HCL 2.5 MG/ML IV SOLN
INTRAVENOUS | Status: DC | PRN
  Administered 2015-07-04: 10 mL via INTRA_ARTERIAL

## 2015-07-04 MED ORDER — LIDOCAINE HCL (PF) 1 % IJ SOLN
INTRAMUSCULAR | Status: DC | PRN
  Administered 2015-07-04: 13 mL via INTRADERMAL
  Administered 2015-07-04: 2 mL via INTRADERMAL

## 2015-07-04 MED ORDER — FENTANYL CITRATE (PF) 100 MCG/2ML IJ SOLN
INTRAMUSCULAR | Status: DC | PRN
  Administered 2015-07-04 (×2): 25 ug via INTRAVENOUS

## 2015-07-04 MED ORDER — HEPARIN (PORCINE) IN NACL 100-0.45 UNIT/ML-% IJ SOLN
950.0000 [IU]/h | INTRAMUSCULAR | Status: DC
  Administered 2015-07-04: 950 [IU]/h via INTRAVENOUS

## 2015-07-04 MED ORDER — SODIUM CHLORIDE 0.9 % IV SOLN: 250.0000 mL | INTRAVENOUS | Status: DC | PRN

## 2015-07-04 MED ORDER — IOPAMIDOL (ISOVUE-370) INJECTION 76%
INTRAVENOUS | Status: DC | PRN
  Administered 2015-07-04: 175 mL via INTRAVENOUS

## 2015-07-04 MED ORDER — HEPARIN (PORCINE) IN NACL 2-0.9 UNIT/ML-% IJ SOLN
INTRAMUSCULAR | Status: AC
  Filled 2015-07-04: qty 1000

## 2015-07-04 SURGICAL SUPPLY — 18 items
CATH EXPO 5F MPA-1 (CATHETERS) ×1 IMPLANT
CATH INFINITI 5 FR 3DRC (CATHETERS) ×1 IMPLANT
CATH INFINITI 5 FR JL3.5 (CATHETERS) ×1 IMPLANT
CATH INFINITI 5FR AL1 (CATHETERS) ×1 IMPLANT
CATH INFINITI 5FR ANG PIGTAIL (CATHETERS) ×1 IMPLANT
CATH INFINITI JR4 5F (CATHETERS) ×1 IMPLANT
CATH LAUNCHER 5F RADR (CATHETERS) IMPLANT
CATH SITESEER 5F MULTI A 2 (CATHETERS) ×1 IMPLANT
CATHETER LAUNCHER 5F RADR (CATHETERS) ×2
DEVICE RAD COMP TR BAND LRG (VASCULAR PRODUCTS) ×1 IMPLANT
GLIDESHEATH SLEND SS 6F .021 (SHEATH) ×1 IMPLANT
KIT HEART LEFT (KITS) ×2 IMPLANT
PACK CARDIAC CATHETERIZATION (CUSTOM PROCEDURE TRAY) ×2 IMPLANT
SHEATH PINNACLE 5F 10CM (SHEATH) ×1 IMPLANT
SYR MEDRAD MARK V 150ML (SYRINGE) ×2 IMPLANT
TRANSDUCER W/STOPCOCK (MISCELLANEOUS) ×2 IMPLANT
TUBING CIL FLEX 10 FLL-RA (TUBING) ×2 IMPLANT
WIRE SAFE-T 1.5MM-J .035X260CM (WIRE) ×1 IMPLANT

## 2015-07-04 NOTE — Progress Notes (Signed)
ANTICOAGULATION CONSULT NOTE - Follow Up Consult  Pharmacy Consult for heparin Indication: chest pain/ACS  Allergies  Allergen Reactions  . Cinnamon Other (See Comments)    Stomach ache     Patient Measurements: Height: 5\' 4"  (162.6 cm) Weight: 175 lb 7.8 oz (79.6 kg) IBW/kg (Calculated) : 59.2 Heparin Dosing Weight: 77 kg  Vital Signs: Temp: 99.1 F (37.3 C) (05/08 0400) Temp Source: Oral (05/08 0400) BP: 107/64 mmHg (05/08 0600) Pulse Rate: 86 (05/08 0600)  Labs:  Recent Labs  07/02/15 0611  07/02/15 1606 07/02/15 2340 07/03/15 0255 07/03/15 0949 07/03/15 1443 07/04/15 0415  HGB 15.5  --   --   --  15.5  --   --  14.0  HCT 44.3  --   --   --  43.9  --   --  41.7  PLT 245  --   --   --  242  --   --  215  HEPARINUNFRC  --   --  0.41 0.54  --   --   --  0.34  CREATININE 1.21  --   --   --  1.24  --   --  1.24  TROPONINI  --   < > 0.03 <0.03 <0.03 <0.03 <0.03  --   < > = values in this interval not displayed.  Estimated Creatinine Clearance: 51.3 mL/min (by C-G formula based on Cr of 1.24).  Assessment:  70 y/o M on heparin gtt for rule out ACS. HL therapeutic at 0.34. No bleeding noted. CBC stable.   Goal of Therapy:  Heparin level 0.3-0.7 units/ml Monitor platelets by anticoagulation protocol: Yes   Plan:  Continue heparin gtt at 950 units/hr Daily HL/CBC Monitor for S&S of bleed F/u plans for cath  Angela Burke, PharmD Pharmacy Resident Pager: [phone] 07/04/2015,6:50 AM

## 2015-07-04 NOTE — Progress Notes (Signed)
  ANTICOAGULATION CONSULT NOTE - Initial Consult  Pharmacy Consult for Heparin Indication: chest pain/ACS  Allergies  Allergen Reactions  . Cinnamon Other (See Comments)    Stomach ache     Patient Measurements: Height: 5\' 4"  (162.6 cm) Weight: 175 lb 7.8 oz (79.6 kg) IBW/kg (Calculated) : 59.2 Heparin Dosing Weight: 76 kg  Vital Signs: Temp: 97.7 F (36.5 C) (05/08 0800) Temp Source: Oral (05/08 0800) BP: 137/77 mmHg (05/08 1455) Pulse Rate: 81 (05/08 1500)  Labs:  Recent Labs  07/02/15 0611  07/02/15 1606 07/02/15 2340 07/03/15 0255 07/03/15 0949 07/03/15 1443 07/04/15 0415  HGB 15.5  --   --   --  15.5  --   --  14.0  HCT 44.3  --   --   --  43.9  --   --  41.7  PLT 245  --   --   --  242  --   --  215  LABPROT  --   --   --   --   --   --   --  13.3  INR  --   --   --   --   --   --   --  0.99  HEPARINUNFRC  --   --  0.41 0.54  --   --   --  0.34  CREATININE 1.21  --   --   --  1.24  --   --  1.24  TROPONINI  --   < > 0.03 <0.03 <0.03 <0.03 <0.03  --   < > = values in this interval not displayed.  Estimated Creatinine Clearance: 51.3 mL/min (by C-G formula based on Cr of 1.24).   Medical History: Past Medical History  Diagnosis Date  . Hyperlipidemia   . Hypertension   . History of detached retina repair 11-06-2006    [facility]  . CAD (coronary artery disease) 2005    Previous stents RCA and circ, Cath 2005 patient with 70% PDA  . History of prostate cancer 03/2005  . History of hematuria   . Cataract     replace 11/2012  . Diabetes mellitus     diet controlled  . Anginal pain [facility])     Assessment: 70 yo M presents on 5/6 with chest pain. Went to cath today and found to need 3 possible grafts so CVTS consulted. Sheath pulled at 1440. CBC stable, no s/s of bleed.  Goal of Therapy:  Heparin level 0.3-0.7 units/ml Monitor platelets by anticoagulation protocol: Yes   Plan:  Start heparin gtt at 950 units/hr at 2100 Check 8 hr HL Monitor  daily HL, CBC, s/s of bleed  F/U open heart surgery plan for possible CABG  Elenor Quinones, PharmD, BCPS Clinical Pharmacist Pager [phone] 07/04/2015 3:30 PM

## 2015-07-04 NOTE — Progress Notes (Signed)
Patient Name: Clifford [name] Date of Encounter: 07/04/2015    SUBJECTIVE: Pt having substernal chest pain that has been constant since admission, pain is a squeezing sensation. He notes that maalox helped relieve has chest pain as well at nitroglycerin. He does not take anything at home for GERD. Denies constipation and leg swelling. He has been NPO since midnight.   TELEMETRY:  NSR Filed Vitals:   07/04/15 0300 07/04/15 0400 07/04/15 0500 07/04/15 0600  BP: 111/82 111/77 134/85 107/64  Pulse: 80 77 79 86  Temp:  99.1 F (37.3 C)    TempSrc:  Oral    Resp: 18 15 16 20   Height:      Weight:    175 lb 7.8 oz (79.6 kg)  SpO2: 94% 94% 94% 97%    Intake/Output Summary (Last 24 hours) at 07/04/15 0836 Last data filed at 07/04/15 0600  Gross per 24 hour  Intake 906.91 ml  Output   1400 ml  Net -493.09 ml   LABS: Basic Metabolic Panel:  Recent Labs  07/02/15 0611 07/03/15 0255 07/04/15 0415  NA 137 135 137  K 4.4 4.3 4.5  CL 101 104 101  CO2 22 21* 25  GLUCOSE 183* 209* 168*  BUN 16 19 18   CREATININE 1.21 1.24 1.24  CALCIUM 9.3 9.4 9.2  MG 1.8  --   --   PHOS 3.5  --   --    CBC:  Recent Labs  07/03/15 0255 07/04/15 0415  WBC 12.8* 8.5  HGB 15.5 14.0  HCT 43.9 41.7  MCV 91.5 93.9  PLT 242 215   Cardiac Enzymes:  Recent Labs  07/03/15 0255 07/03/15 0949 07/03/15 1443  TROPONINI <0.03 <0.03 <0.03   Fasting Lipid Panel:  Recent Labs  07/03/15 0255  CHOL 118  HDL 43  LDLCALC 51  TRIG 120  CHOLHDL 2.7    Physical Exam: Blood pressure 107/64, pulse 86, temperature 99.1 F (37.3 C), temperature source Oral, resp. rate 20, height 5\' 4"  (1.626 m), weight 175 lb 7.8 oz (79.6 kg), SpO2 97 %. Weight change: 2 lb 7.8 oz (1.128 kg)  Wt Readings from Last 3 Encounters:  07/04/15 175 lb 7.8 oz (79.6 kg)  04/12/15 177 lb (80.287 kg)  03/29/15 177 lb 12.8 oz (80.65 kg)    Physical Exam  Constitutional: He appears well-developed and  well-nourished. No distress.  HENT:  Head: Normocephalic and atraumatic.  Nose: Nose normal.  Eyes: Conjunctivae and EOM are normal. No scleral icterus.  Cardiovascular: Normal rate, regular rhythm and normal heart sounds.  Exam reveals no gallop and no friction rub.   No murmur heard. Pulmonary/Chest: Effort normal and breath sounds normal. No respiratory distress. He has no wheezes. He has no rales. He exhibits no tenderness (non TTP of sternum).  Abdominal: Soft. Bowel sounds are normal. He exhibits no distension and no mass. There is no tenderness. There is no rebound and no guarding.  Genitourinary: Guaiac positive stool.  Skin: Skin is warm and dry. No rash noted. No erythema. No pallor.    ASSESSMENT:  70 y/o male w/ PMHx of HTN, HLD, prostate cancer, DM, and CAD (s/p PCI in 1995, 2000, and 2002) who presented w/ substernal CP that was relieved w/ nitroglycerin. His CP has been constant since admission w/n EKGs and troponins negative.    Plan:  1. Unstable angina-- cardiac cath today at noon. On heparin and nitro gtt.   2. HLD-- LDL 51  this admission, continue vytorin  3. HTN-- BP stable, today 107/64. Continue lisinopril 5mg , home dose is 10 mg. Can consider stopping lisinopril and starting on a beta blocker.    Clifford [name] 07/04/2015, 8:36 AM   Attending Note:   The patient was seen and examined.  Agree with assessment and plan as noted above.  Changes made to the above note as needed.  Pt has had similar pain prior to his PCI in the past. Agree with plan for cath.    Clifford [name], Clifford [name]., MD, [facility] 07/04/2015, 10:18 AM [address],  [place] Pager [phone]

## 2015-07-04 NOTE — Progress Notes (Signed)
Physician notified: Grandville Silos, [place] At: [phone]  Regarding:  Pt having chest heaviness 7/10, gave maalox increased NTG. BP now180/100s was 130s/80s. HR 90s NSR was 70-80s. getting EKG. Had cath today, awaiting CVTS consult. Can he have morhpine prn?   Returned call at 1900. Will come see pt, placed order for PRN morphine.

## 2015-07-04 NOTE — Interval H&P Note (Signed)
Cath Lab Visit (complete for each Cath Lab visit)  Clinical Evaluation Leading to the Procedure:   ACS: Yes.    Non-ACS:    Anginal Classification: CCS IV  Anti-ischemic medical therapy: Maximal Therapy (2 or more classes of medications)  Non-Invasive Test Results: No non-invasive testing performed  Prior CABG: No previous CABG      History and Physical Interval Note:  07/04/2015 11:37 AM  Clifford [name]  has presented today for surgery, with the diagnosis of unstable angina  The various methods of treatment have been discussed with the patient and family. After consideration of risks, benefits and other options for treatment, the patient has consented to  Procedure(s): Left Heart Cath and Coronary Angiography (N/A) as a surgical intervention .  The patient's history has been reviewed, patient examined, no change in status, stable for surgery.  I have reviewed the patient's chart and labs.  Questions were answered to the patient's satisfaction.     Rigel Filsinger S.

## 2015-07-04 NOTE — H&P (View-Only) (Signed)
Patient Name: Clifford [name] Date of Encounter: 07/04/2015    SUBJECTIVE: Pt having substernal chest pain that has been constant since admission, pain is a squeezing sensation. He notes that maalox helped relieve has chest pain as well at nitroglycerin. He does not take anything at home for GERD. Denies constipation and leg swelling. He has been NPO since midnight.   TELEMETRY:  NSR Filed Vitals:   07/04/15 0300 07/04/15 0400 07/04/15 0500 07/04/15 0600  BP: 111/82 111/77 134/85 107/64  Pulse: 80 77 79 86  Temp:  99.1 F (37.3 C)    TempSrc:  Oral    Resp: 18 15 16 20   Height:      Weight:    175 lb 7.8 oz (79.6 kg)  SpO2: 94% 94% 94% 97%    Intake/Output Summary (Last 24 hours) at 07/04/15 0836 Last data filed at 07/04/15 0600  Gross per 24 hour  Intake 906.91 ml  Output   1400 ml  Net -493.09 ml   LABS: Basic Metabolic Panel:  Recent Labs  07/02/15 0611 07/03/15 0255 07/04/15 0415  NA 137 135 137  K 4.4 4.3 4.5  CL 101 104 101  CO2 22 21* 25  GLUCOSE 183* 209* 168*  BUN 16 19 18   CREATININE 1.21 1.24 1.24  CALCIUM 9.3 9.4 9.2  MG 1.8  --   --   PHOS 3.5  --   --    CBC:  Recent Labs  07/03/15 0255 07/04/15 0415  WBC 12.8* 8.5  HGB 15.5 14.0  HCT 43.9 41.7  MCV 91.5 93.9  PLT 242 215   Cardiac Enzymes:  Recent Labs  07/03/15 0255 07/03/15 0949 07/03/15 1443  TROPONINI <0.03 <0.03 <0.03   Fasting Lipid Panel:  Recent Labs  07/03/15 0255  CHOL 118  HDL 43  LDLCALC 51  TRIG 120  CHOLHDL 2.7    Physical Exam: Blood pressure 107/64, pulse 86, temperature 99.1 F (37.3 C), temperature source Oral, resp. rate 20, height 5\' 4"  (1.626 m), weight 175 lb 7.8 oz (79.6 kg), SpO2 97 %. Weight change: 2 lb 7.8 oz (1.128 kg)  Wt Readings from Last 3 Encounters:  07/04/15 175 lb 7.8 oz (79.6 kg)  04/12/15 177 lb (80.287 kg)  03/29/15 177 lb 12.8 oz (80.65 kg)    Physical Exam  Constitutional: He appears well-developed and  well-nourished. No distress.  HENT:  Head: Normocephalic and atraumatic.  Nose: Nose normal.  Eyes: Conjunctivae and EOM are normal. No scleral icterus.  Cardiovascular: Normal rate, regular rhythm and normal heart sounds.  Exam reveals no gallop and no friction rub.   No murmur heard. Pulmonary/Chest: Effort normal and breath sounds normal. No respiratory distress. He has no wheezes. He has no rales. He exhibits no tenderness (non TTP of sternum).  Abdominal: Soft. Bowel sounds are normal. He exhibits no distension and no mass. There is no tenderness. There is no rebound and no guarding.  Genitourinary: Guaiac positive stool.  Skin: Skin is warm and dry. No rash noted. No erythema. No pallor.    ASSESSMENT:  70 y/o male w/ PMHx of HTN, HLD, prostate cancer, DM, and CAD (s/p PCI in 1995, 2000, and 2002) who presented w/ substernal CP that was relieved w/ nitroglycerin. His CP has been constant since admission w/n EKGs and troponins negative.    Plan:  1. Unstable angina-- cardiac cath today at noon. On heparin and nitro gtt.   2. HLD-- LDL 51  this admission, continue vytorin  3. HTN-- BP stable, today 107/64. Continue lisinopril 5mg , home dose is 10 mg. Can consider stopping lisinopril and starting on a beta blocker.    [name] 07/04/2015, 8:36 AM   Attending Note:   The patient was seen and examined.  Agree with assessment and plan as noted above.  Changes made to the above note as needed.  Pt has had similar pain prior to his PCI in the past. Agree with plan for cath.    Thayer Headings, Brooke Bonito., MD, [facility] 07/04/2015, 10:18 AM [address]  [place] Pager [phone]

## 2015-07-04 NOTE — Progress Notes (Signed)
Site area: Right groin a 6 french arterial sheath was removed by Aileen Pilot RN  On 2900  Site Prior to Removal:  Level 0  Pressure Applied For 20 MINUTES    Bedrest Beginning at 1450p  Manual:   Yes.    Patient Status During Pull:  stable  Post Pull Groin Site:  Level 0  Post Pull Instructions Given:  Yes.    Post Pull Pulses Present:  Yes.    Dressing Applied:  Yes.    Comments:  VS remain stable during sheath pull.  Level 1 hematoma down  level 0

## 2015-07-05 ENCOUNTER — Encounter (HOSPITAL_COMMUNITY): Payer: Self-pay | Admitting: Interventional Cardiology

## 2015-07-05 ENCOUNTER — Other Ambulatory Visit: Payer: Self-pay | Admitting: *Deleted

## 2015-07-05 DIAGNOSIS — I2510 Atherosclerotic heart disease of native coronary artery without angina pectoris: Secondary | ICD-10-CM

## 2015-07-05 DIAGNOSIS — I10 Essential (primary) hypertension: Secondary | ICD-10-CM

## 2015-07-05 DIAGNOSIS — I25110 Atherosclerotic heart disease of native coronary artery with unstable angina pectoris: Secondary | ICD-10-CM

## 2015-07-05 LAB — CBC
HCT: 39.2 % (ref 39.0–52.0)
Hemoglobin: 13.4 g/dL (ref 13.0–17.0)
MCH: 31.9 pg (ref 26.0–34.0)
MCHC: 34.2 g/dL (ref 30.0–36.0)
MCV: 93.3 fL (ref 78.0–100.0)
PLATELETS: 204 10*3/uL (ref 150–400)
RBC: 4.2 MIL/uL — AB (ref 4.22–5.81)
RDW: 13.1 % (ref 11.5–15.5)
WBC: 8.1 10*3/uL (ref 4.0–10.5)

## 2015-07-05 LAB — HEPARIN LEVEL (UNFRACTIONATED)
HEPARIN UNFRACTIONATED: 0.27 [IU]/mL — AB (ref 0.30–0.70)
HEPARIN UNFRACTIONATED: 0.5 [IU]/mL (ref 0.30–0.70)
Heparin Unfractionated: 0.43 IU/mL (ref 0.30–0.70)

## 2015-07-05 LAB — GLUCOSE, CAPILLARY: Glucose-Capillary: 122 mg/dL — ABNORMAL HIGH (ref 65–99)

## 2015-07-05 LAB — PLATELET INHIBITION P2Y12: Platelet Function  P2Y12: 98 [PRU] — ABNORMAL LOW (ref 194–418)

## 2015-07-05 MED ORDER — HEPARIN (PORCINE) IN NACL 100-0.45 UNIT/ML-% IJ SOLN
1100.0000 [IU]/h | INTRAMUSCULAR | Status: DC
  Administered 2015-07-05 – 2015-07-07 (×4): 1100 [IU]/h via INTRAVENOUS
  Filled 2015-07-05 (×3): qty 250

## 2015-07-05 MED ORDER — WHITE PETROLATUM GEL
Status: AC
Start: 2015-07-05 — End: 2015-07-06
  Filled 2015-07-05: qty 1

## 2015-07-05 NOTE — Progress Notes (Addendum)
ANTICOAGULATION CONSULT NOTE - Follow-up Consult  Pharmacy Consult for Heparin Indication: chest pain/ACS  Allergies  Allergen Reactions  . Cinnamon Other (See Comments)    Stomach ache     Patient Measurements: Height: 5\' 4"  (162.6 cm) Weight: 175 lb 7.8 oz (79.6 kg) IBW/kg (Calculated) : 59.2 Heparin Dosing Weight: 76 kg  Vital Signs: Temp: 98.6 F (37 C) (05/09 0430) Temp Source: Oral (05/09 0430) BP: 129/81 mmHg (05/09 0430) Pulse Rate: 79 (05/09 0430)  Labs:  Recent Labs  07/02/15 2340  07/03/15 0255 07/03/15 0949 07/03/15 1443 07/04/15 0415 07/05/15 0517  HGB  --   < > 15.5  --   --  14.0 13.4  HCT  --   --  43.9  --   --  41.7 39.2  PLT  --   --  242  --   --  215 204  LABPROT  --   --   --   --   --  13.3  --   INR  --   --   --   --   --  0.99  --   HEPARINUNFRC 0.54  --   --   --   --  0.34 0.27*  CREATININE  --   --  1.24  --   --  1.24  --   TROPONINI <0.03  --  <0.03 <0.03 <0.03  --   --   < > = values in this interval not displayed.  Estimated Creatinine Clearance: 51.3 mL/min (by C-G formula based on Cr of 1.24).   Assessment: 70 yo M presents on 5/6 with chest pain. S/p cath 5/8 and found to need 3 possible grafts so CVTS consulted. Heparin level slightly subtherapeutic on 950 units/hr. No issues with line or bleeding reported per RN. Hgb, plt trending down a bit.  Goal of Therapy:  Heparin level 0.3-0.7 units/ml Monitor platelets by anticoagulation protocol: Yes   Plan:  Increase heparin gtt to 1100 units/hr Check 8 hr HL  F/U open heart surgery plan for possible CABG  Sherlon Handing, PharmD, BCPS Clinical pharmacist, pager [phone] 07/05/2015 6:26 AM  ________________________________________________________  ADDENDUM:  HL 0.43 after rate increase to 1100 units/hr. CBC stable.  Plan: Continue heparin gtt at 1100 units/hr 8 hr HL Daily HL, CBC

## 2015-07-05 NOTE — Consult Note (Signed)
[address]       [place] [address]             [phone]        Clifford [name] [place] Medical Record [id number] Date of Birth: 04/01/1945  Referring: Johnsie Cancel Primary Care: Hoyt Koch, MD  Chief Complaint:    Chief Complaint  Patient presents with  . Chest Pain    History of Present Illness:      [name] is a 70 yo white male with known history of DM diet controlled, HTN, Hyperlipidemia, H/O nicotine abuse, Prostate cancer S/P Prostatectomy over 10 years ago, and CAD.  His CAD started in 1995 at which time had underwent catheterization with angioplasty.  He had further issues again in 2000 and 2002 with stent placement to the Left Circumflex artery.  Since that time the patient has done well.  However on Friday 07/01/2015 the patient developed mid sternal chest pain after eating.  He thought this was indigestion and gas.  He took anti-gas medication with little relief.  He states this occurred off and on throughout the evening.  On Saturday the pain persisted and was worse and he call EMS for transportation to the ED.  He was ruled out for MI, but continued to have chest pain.  He was admitted and treated with NTG and Heparin drips.  It was felt with patient's previous cardiac history cardiac catheterization would be indicated.  This was done on 07/04/2015 and revealed 3V CAD and Coronary bypass grafting would be indicated and TCTS consult was obtained.  Currently the patient is chest pain free.  He did receive Plavix on Sunday and Monday prior to catheterization.  The patient is active and is able to complete daily tasks without difficulty.    Current Activity/ Functional Status: Patient is independent with mobility/ambulation, transfers, ADL's, IADL's.   Zubrod Score: At the time of surgery this patient's most appropriate activity status/level should be described as: [x]     0    Normal activity, no symptoms []     1    Restricted in physical  strenuous activity but ambulatory, able to do out light work []     2    Ambulatory and capable of self care, unable to do work activities, up and about                 more than 50%  Of the time                            []     3    Only limited self care, in bed greater than 50% of waking hours []     4    Completely disabled, no self care, confined to bed or chair []     5    Moribund  Past Medical History  Diagnosis Date  . Hyperlipidemia   . Hypertension   . History of detached retina repair 11-06-2006    [facility]  . CAD (coronary artery disease) 2005    Previous stents RCA and circ, Cath 2005 patient with 70% PDA  . History of prostate cancer 03/2005  . History of hematuria   . Cataract     replace 11/2012  . Diabetes mellitus     diet controlled  . Anginal pain [facility])     Past Surgical History  Procedure Laterality Date  . Prostatectomy  03/2005  .  Ptca  2005  . Lipoma excision    . Wrist ganglion excision    . Hemorrhoid surgery      I&D of thrombosed hemorrhoid  . Cardiac catheterization  05.31.2002    Initially the stenosis in the proximal to mid right coronary artery was estimated ar 95%. Following stenting, this improved to 0%. There was residual mild disease in the sostium of the proximal right coronary atrtery. Successful stentng of the proximal to mid right coronary artery with improvement in stent diameter narrowing from 95% to 0% Proc Date  07/29/2000  . Colonoscopy    . Eye surgery  11/24/2012    left eye cataract  . Eye surgery  12/22/2012    right eye cataract  . Cataract extraction w/ intraocular lens implant Bilateral OS Sept '14; OD Oct '14    Dr. Bing Plume  . Angioplasty      1995,2000,2002  . Cardiac catheterization N/A 07/04/2015    Procedure: Left Heart Cath and Coronary Angiography;  Surgeon: Jettie Booze, MD;  Location: [place] [facility];  Service: Cardiovascular;  Laterality: N/A;    History  Smoking status  . Former Smoker  . Quit date:  02/27/1983  Smokeless tobacco  . Never Used    History  Alcohol Use No    Social History   Social History  . Marital Status: Married    Spouse Name: N/A  . Number of Children: N/A  . Years of Education: N/A   Occupational History  . Not on file.   Social History Main Topics  . Smoking status: Former Smoker    Quit date: 02/27/1983  . Smokeless tobacco: Never Used  . Alcohol Use: No  . Drug Use: No  . Sexual Activity:    Partners: Female   Other Topics Concern  . 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 ACP - discussed with patient and provided packet and referral to [email or web address] Nov '15. He already has stated that if he is :"comatose" pull the plug.     Allergies  Allergen Reactions  . Cinnamon Other (See Comments)    Stomach ache     Current Facility-Administered Medications  Medication Dose Route Frequency Provider Last Rate Last Dose  . 0.9 %  sodium chloride infusion  250 mL Intravenous PRN Rogelia Mire, NP      . 0.9 %  sodium chloride infusion  250 mL Intravenous PRN Jettie Booze, MD   Stopped at 07/05/15 0700  . acetaminophen (TYLENOL) tablet 650 mg  650 mg Oral Q4H PRN Jettie Booze, MD      . ALPRAZolam Duanne Moron) tablet 0.25 mg  0.25 mg Oral PRN Jules Husbands, MD   0.25 mg at 07/04/15 0012  . alum & mag hydroxide-simeth (MAALOX/MYLANTA) 200-200-20 MG/5ML suspension 30 mL  30 mL Oral Q4H PRN Josue Hector, MD   30 mL at 07/04/15 1832  . aspirin EC tablet 324 mg  324 mg Oral Once Rogelia Mire, NP   324 mg at 07/02/15 1213  . ezetimibe-simvastatin (VYTORIN) 10-80 MG per tablet 1 tablet  1 tablet Oral Daily Rogelia Mire, NP   1 tablet at 07/05/15 1115  . gi cocktail (Maalox,Lidocaine,Donnatal)  30 mL Oral Once Jules Husbands, MD      . heparin ADULT infusion 100 units/mL (25000 units/250  mL)  1,100 Units/hr Intravenous Continuous Franky Macho, RPH 11 mL/hr at 07/05/15 0800 1,100 Units/hr at 07/05/15 0800  . lisinopril (PRINIVIL,ZESTRIL) tablet 5 mg  5 mg Oral Daily Rogelia Mire, NP   5 mg at 07/05/15 1115  . morphine 2 MG/ML injection 2 mg  2 mg Intravenous Q2H PRN Eileen Stanford, PA-C      . multivitamin with minerals tablet 1 tablet  1 tablet Oral Daily Rogelia Mire, NP   1 tablet at 07/05/15 1115  . nitroGLYCERIN (NITROSTAT) SL tablet 0.4 mg  0.4 mg Sublingual Q5 Min x 3 PRN Rogelia Mire, NP   0.4 mg at 07/03/15 [id number]  . nitroGLYCERIN 50 mg in dextrose 5 % 250 mL (0.2 mg/mL) infusion  0-200 mcg/min Intravenous Titrated Rogelia Mire, NP   Stopped at 07/05/15 0700  . ondansetron (ZOFRAN) injection 4 mg  4 mg Intravenous Q6H PRN Jettie Booze, MD      . sodium chloride flush (NS) 0.9 % injection 3 mL  3 mL Intravenous Q12H Rogelia Mire, NP   3 mL at 07/05/15 1000  . sodium chloride flush (NS) 0.9 % injection 3 mL  3 mL Intravenous PRN Rogelia Mire, NP      . sodium chloride flush (NS) 0.9 % injection 3 mL  3 mL Intravenous Q12H Jettie Booze, MD   0 mL at 07/04/15 1700  . sodium chloride flush (NS) 0.9 % injection 3 mL  3 mL Intravenous PRN Jettie Booze, MD        Prescriptions prior to admission  Medication Sig Dispense Refill Last Dose  . Ascorbic Acid (VITAMIN C PO) Take 1 tablet by mouth daily.   07/01/2015 at Unknown time  . aspirin EC 81 MG tablet Take 324 mg by mouth once.    07/02/2015 at Unknown time  . ezetimibe-simvastatin (VYTORIN) 10-80 MG tablet Take 1 tablet by mouth daily. 90 tablet 1 07/01/2015 at Unknown time  . lisinopril (PRINIVIL,ZESTRIL) 10 MG tablet Take 0.5 tablets (5 mg total) by mouth daily. 90 tablet 1 07/01/2015 at Unknown time  . Multiple Vitamin (MULTIVITAMIN) tablet Take 1 tablet by mouth daily.     07/02/2015 at Unknown time  . mupirocin cream (BACTROBAN) 2 % Apply 1 application topically 2  (two) times daily. (Patient not taking: Reported on 04/12/2015) 15 g 0 Not Taking at Unknown time    Family History  Problem Relation Age of Onset  . Diabetes Sister   . Cancer Neg Hx     Colon  . Colon cancer Neg Hx   . Esophageal cancer Neg Hx   . Rectal cancer Neg Hx   . Stomach cancer Neg Hx   . CAD Father   . Colon polyps Sister    Review of Systems:  Constitutional: negative Eyes: negative Respiratory: positive for pleurisy/chest pain Cardiovascular: positive for chest pain and chest pressure/discomfort Gastrointestinal: positive for dyspepsia and reflux symptoms Hematologic/lymphatic: negative Neurological: negative     Cardiac Review of Systems: Y or N  Chest Pain [  y  ]  Resting SOB [ n  ] Exertional SOB  [ n ]  Orthopnea [  ]   Pedal Edema [   ]    Palpitations [  ] Syncope  [  ]   Presyncope [   ]  General Review of Systems: [Y] = yes [  ]=no Constitional: recent weight change [  ]; anorexia [  ];  fatigue [  ]; nausea [  ]; night sweats [  ]; fever [  ]; or chills [  ]                                                               Dental: poor dentition[  ]; Last Dentist visit:   Eye : blurred vision [  ]; diplopia [   ]; vision changes [  ];  Amaurosis fugax[  ]; Resp: cough [  ];  wheezing[  ];  hemoptysis[  ]; shortness of breath[n  ]; paroxysmal nocturnal dyspnea[  ]; dyspnea on exertion[n  ]; or orthopnea[  ];  GI:  gallstones[  ], vomiting[  ];  dysphagia[  ]; melena[  ];  hematochezia [  ]; heartburn[y  ];   Hx of  Colonoscopy[  ]; GU: kidney stones [  ]; hematuria[  ];   dysuria [  ];  nocturia[  ];  history of     obstruction [  ]; urinary frequency [  ]             Skin: rash, swelling[  ];, hair loss[  ];  peripheral edema[  ];  or itching[  ]; Musculosketetal: myalgias[  ];  joint swelling[  ];  joint erythema[  ];  joint pain[  ];  back pain[  ];  Heme/Lymph: bruising[  ];  bleeding[  ];  anemia[  ];  Neuro: TIA[  ];  headaches[  ];  stroke[  ];  vertigo[   ];  seizures[  ];   paresthesias[  ];  difficulty walking[  ];  Psych:depression[  ]; anxiety[  ];  Endocrine: diabetes[ y, diet controlled ];  thyroid dysfunction[  ];  Immunizations: Flu [  ]; Pneumococcal[  ];  Other:  Physical Exam: BP 150/90 mmHg  Pulse 100  Temp(Src) 98.4 F (36.9 C) (Oral)  Resp 25  Ht 5\' 4"  (1.626 m)  Wt 174 lb 12.8 oz (79.289 kg)  BMI 29.99 kg/m2  SpO2 92%  General appearance: alert, cooperative and no distress Head: Normocephalic, without obvious abnormality, atraumatic Neck: no adenopathy, no carotid bruit, no JVD, supple, symmetrical, trachea midline and thyroid not enlarged, symmetric, no tenderness/mass/nodules Lymph nodes: Cervical, supraclavicular, and axillary nodes normal. Resp: clear to auscultation bilaterally Cardio: regular rate and rhythm GI: soft, non-tender; bowel sounds normal; no masses,  no organomegaly Extremities: extremities normal, atraumatic, no cyanosis or edema Neurologic: Grossly normal  Diagnostic Studies & Laboratory data:     Recent Radiology Findings:   No results found.   I have independently reviewed the above radiologic studies.  Recent Lab Findings: Lab Results  Component Value Date   WBC 8.1 07/05/2015   HGB 13.4 07/05/2015   HCT 39.2 07/05/2015   PLT 204 07/05/2015   GLUCOSE 168* 07/04/2015   CHOL 118 07/03/2015   TRIG 120 07/03/2015   HDL 43 07/03/2015   LDLCALC 51 07/03/2015   ALT 56* 01/17/2015   AST 37 01/17/2015   NA 137 07/04/2015   K 4.5 07/04/2015   CL 101 07/04/2015   CREATININE 1.24 07/04/2015   BUN 18 07/04/2015   CO2 25 07/04/2015   TSH 1.13 01/14/2014   INR 0.99 07/04/2015   HGBA1C 7.4* 01/17/2015   CATH:  Left Heart Cath and Coronary Angiography    Conclusion     Ost Cx lesion, 50% stenosed.  Mid LAD lesion, 95% stenosed. Severe, diffuse disease in the mid to distal LAD which is all heavily calcified. The origin of a moderate-sized diagonal is also compromised.  Prox LAD  to Mid LAD lesion, 50% stenosed.  RPDA lesion, 80% stenosed.  The left ventricular systolic function is normal.  Normal LVEDP.  Will obtain cardiac surgery consultation for possible bypass surgery. He could receive 3 grafts, to the LAD, diagonal and PDA.       I have independently reviewed the above  cath films and reviewed the findings with the  patient .    Assessment / Plan:      1. CAD- needs CABG, however loaded with  Plavix and 2 more doses P2Y12 less then 100 2. DM- per patient dietary control, Hgb A1c is 7.4 in November but was slowly increasing, will need to repeat and get diabetes education as patient may require start of oral agent at discharge 3. HTN 4. Hyperlipidemia 5. Dispo- patient stable, currently chest pain free, will await P2Y12 to normalize.... Possibly for OR on Friday   I  spent 40 minutes counseling the patient face to face and 50% or more the  time was spent in counseling and coordination of care. The total time spent in the appointment was 60 minutes.  [name] MD      [address] [place] [address] Office [phone]   [place]

## 2015-07-05 NOTE — Progress Notes (Addendum)
Patient ID: DAMERON HUGHBANKS, male   DOB: [birth year], 70 y.o.   MRN: [id number]       Patient Name: NATHANEAL ANZELMO Date of Encounter: 07/05/2015    SUBJECTIVE: right arm a bit sore. Anxious to talk to surgeon no chest pain   TELEMETRY:  NSR Filed Vitals:   07/05/15 0430 07/05/15 0500 07/05/15 0630 07/05/15 0700  BP: 129/81 124/84 133/82 118/99  Pulse: 79 81 90 90  Temp: 98.6 F (37 C)   98.3 F (36.8 C)  TempSrc: Oral   Oral  Resp: 12 15 17 18   Height:      Weight:  79.289 kg (174 lb 12.8 oz)    SpO2: 95% 94% 95% 93%    Intake/Output Summary (Last 24 hours) at 07/05/15 0757 Last data filed at 07/05/15 0700  Gross per 24 hour  Intake 1050.97 ml  Output   1450 ml  Net -399.03 ml   LABS: Basic Metabolic Panel:  Recent Labs  07/03/15 0255 07/04/15 0415  NA 135 137  K 4.3 4.5  CL 104 101  CO2 21* 25  GLUCOSE 209* 168*  BUN 19 18  CREATININE 1.24 1.24  CALCIUM 9.4 9.2   CBC:  Recent Labs  07/04/15 0415 07/05/15 0517  WBC 8.5 8.1  HGB 14.0 13.4  HCT 41.7 39.2  MCV 93.9 93.3  PLT 215 204   Cardiac Enzymes:  Recent Labs  07/03/15 0255 07/03/15 0949 07/03/15 1443  TROPONINI <0.03 <0.03 <0.03   Fasting Lipid Panel:  Recent Labs  07/03/15 0255  CHOL 118  HDL 43  LDLCALC 51  TRIG 120  CHOLHDL 2.7    Physical Exam: Blood pressure 118/99, pulse 90, temperature 98.3 F (36.8 C), temperature source Oral, resp. rate 18, height 5\' 4"  (1.626 m), weight 79.289 kg (174 lb 12.8 oz), SpO2 93 %. Weight change: -0.311 kg (-11 oz)  Wt Readings from Last 3 Encounters:  07/05/15 79.289 kg (174 lb 12.8 oz)  04/12/15 80.287 kg (177 lb)  03/29/15 80.65 kg (177 lb 12.8 oz)    Physical Exam  Constitutional: He appears well-developed and well-nourished. No distress.  HENT:  Head: Normocephalic and atraumatic.  Nose: Nose normal.  Eyes: Conjunctivae and EOM are normal. No scleral icterus.  Cardiovascular: Normal rate, regular rhythm and normal heart sounds.   Exam reveals no gallop and no friction rub.   No murmur heard. Pulmonary/Chest: Effort normal and breath sounds normal. No respiratory distress. He has no wheezes. He has no rales. He exhibits no tenderness (non TTP of sternum).  Abdominal: Soft. Bowel sounds are normal. He exhibits no distension and no mass. There is no tenderness. There is no rebound and no guarding.  Genitourinary: Guaiac positive stool.  Skin: Skin is warm and dry. No rash noted. No erythema. No pallor.    ASSESSMENT:  70 y/o male w/ PMHx of HTN, HLD, prostate cancer, DM, and CAD (s/p PCI in 1995, 2000, and 2002) who presented w/ substernal CP that was relieved w/ nitroglycerin. His CP has been constant since admission w/n EKGs and troponins negative.    Plan:  1. Unstable angina-- Progressive 3VD  Continue heparin and nitro CVTS to see today have left message with office  Did have plavix So will check P2Y to see timing of surgery Discussed with Dr Servando Snare  2. HLD-- LDL 51 this admission, continue vytorin  3. HTN-- BP stable, today . Continue lisinopril 5mg , home dose is 10 mg. Can consider stopping lisinopril and  starting on a beta blocker.    Rozann Lesches 07/05/2015, 7:57 AM

## 2015-07-05 NOTE — Progress Notes (Addendum)
ANTICOAGULATION CONSULT NOTE - Follow-up Consult  Pharmacy Consult for Heparin Indication: chest pain/ACS  Allergies  Allergen Reactions  . Cinnamon Other (See Comments)    Stomach ache     Patient Measurements: Height: 5\' 4"  (162.6 cm) Weight: 174 lb 12.8 oz (79.289 kg) IBW/kg (Calculated) : 59.2 Heparin Dosing Weight: 76 kg  Vital Signs: Temp: 98.4 F (36.9 C) (05/09 1204) Temp Source: Oral (05/09 1204) BP: 150/90 mmHg (05/09 1300) Pulse Rate: 100 (05/09 1300)  Labs:  Recent Labs  07/03/15 0255 07/03/15 0949 07/03/15 1443 07/04/15 0415 07/05/15 0517 07/05/15 1502  HGB 15.5  --   --  14.0 13.4  --   HCT 43.9  --   --  41.7 39.2  --   PLT 242  --   --  215 204  --   LABPROT  --   --   --  13.3  --   --   INR  --   --   --  0.99  --   --   HEPARINUNFRC  --   --   --  0.34 0.27* 0.43  CREATININE 1.24  --   --  1.24  --   --   TROPONINI <0.03 <0.03 <0.03  --   --   --     Estimated Creatinine Clearance: 51.2 mL/min (by C-G formula based on Cr of 1.24).  Assessment: 70 yo M presents on 5/6 with chest pain. S/p cath 5/8 and found to need 3 possible grafts so CVTS consulted. Heparin level now therapeutic at 0.43. No issues with line or bleeding reported per RN. Hgb, plt trending down a bit.  Goal of Therapy:  Heparin level 0.3-0.7 units/ml Monitor platelets by anticoagulation protocol: Yes   Plan:  Continue heparin 1,100 units/hr Monitor daily HL, CBC, s/s of bleed  ADDENDUM:  HL remains therapeutic  Plan: Continue heparin gtt at 1,100 units/hr Monitor daily HL, CBC, s/s of bleed  Elenor Quinones, PharmD, [facility] Clinical Pharmacist Pager [phone] 07/05/2015 10:37 PM

## 2015-07-06 ENCOUNTER — Inpatient Hospital Stay (HOSPITAL_COMMUNITY): Payer: 59

## 2015-07-06 DIAGNOSIS — I25110 Atherosclerotic heart disease of native coronary artery with unstable angina pectoris: Secondary | ICD-10-CM

## 2015-07-06 DIAGNOSIS — I2510 Atherosclerotic heart disease of native coronary artery without angina pectoris: Secondary | ICD-10-CM

## 2015-07-06 LAB — CBC
HEMATOCRIT: 40.1 % (ref 39.0–52.0)
Hemoglobin: 14 g/dL (ref 13.0–17.0)
MCH: 32.5 pg (ref 26.0–34.0)
MCHC: 34.9 g/dL (ref 30.0–36.0)
MCV: 93 fL (ref 78.0–100.0)
Platelets: 216 10*3/uL (ref 150–400)
RBC: 4.31 MIL/uL (ref 4.22–5.81)
RDW: 13.1 % (ref 11.5–15.5)
WBC: 7.4 10*3/uL (ref 4.0–10.5)

## 2015-07-06 LAB — ECHOCARDIOGRAM COMPLETE
HEIGHTINCHES: 64 in
Weight: 2825.42 oz

## 2015-07-06 LAB — HEPARIN LEVEL (UNFRACTIONATED): Heparin Unfractionated: 0.47 IU/mL (ref 0.30–0.70)

## 2015-07-06 MED ORDER — ASPIRIN 81 MG PO CHEW
81.0000 mg | CHEWABLE_TABLET | Freq: Every day | ORAL | Status: DC
  Administered 2015-07-06 – 2015-07-07 (×2): 81 mg via ORAL
  Filled 2015-07-06 (×2): qty 1

## 2015-07-06 MED ORDER — METOPROLOL TARTRATE 12.5 MG HALF TABLET
12.5000 mg | ORAL_TABLET | Freq: Two times a day (BID) | ORAL | Status: DC
Start: 2015-07-06 — End: 2015-07-08
  Administered 2015-07-06 – 2015-07-07 (×4): 12.5 mg via ORAL
  Filled 2015-07-06 (×4): qty 1

## 2015-07-06 NOTE — Progress Notes (Signed)
ANTICOAGULATION CONSULT NOTE - Follow-up Consult  Pharmacy Consult for Heparin Indication: chest pain/ACS  Allergies  Allergen Reactions  . Cinnamon Other (See Comments)    Stomach ache     Patient Measurements: Height: 5\' 4"  (162.6 cm) Weight: 176 lb 9.4 oz (80.1 kg) IBW/kg (Calculated) : 59.2 Heparin Dosing Weight: 76 kg  Vital Signs: Temp: 98.6 F (37 C) (05/10 0400) Temp Source: Oral (05/10 0400) BP: 122/78 mmHg (05/10 0600) Pulse Rate: 77 (05/10 0600)  Labs:  Recent Labs  07/03/15 0949 07/03/15 1443  07/04/15 0415 07/05/15 0517 07/05/15 1502 07/05/15 2201 07/06/15 0521  HGB  --   --   < > 14.0 13.4  --   --  14.0  HCT  --   --   --  41.7 39.2  --   --  40.1  PLT  --   --   --  215 204  --   --  216  LABPROT  --   --   --  13.3  --   --   --   --   INR  --   --   --  0.99  --   --   --   --   HEPARINUNFRC  --   --   < > 0.34 0.27* 0.43 0.50 0.47  CREATININE  --   --   --  1.24  --   --   --   --   TROPONINI <0.03 <0.03  --   --   --   --   --   --   < > = values in this interval not displayed.  Estimated Creatinine Clearance: 51.5 mL/min (by C-G formula based on Cr of 1.24).  Assessment: 70 yo M presents on 5/6 with chest pain on heparin gtt. S/p cath 5/8 and found to need 3 possible grafts. Heparin level now therapeutic at 0.47. CBC stable.   Goal of Therapy:  Heparin level 0.3-0.7 units/ml Monitor platelets by anticoagulation protocol: Yes   Plan:  Continue heparin gtt at 1,100 units/hr Daily HL, CBC,  Monitor for s/s of bleed  Angela Burke, PharmD Pharmacy Resident Pager: [phone]

## 2015-07-06 NOTE — Progress Notes (Signed)
Pt woke up complaining of chest pain that he states was 3/10 but felt he needed to sit on the edge of the bed to catch his breath. Also asked to be placed back on oxygen. PRN morphine given patient stated his pain level was 0 after a couple minutes. EKG done, cardiology fellow notified. Pt currently resting with no complaints. Will continue to monitor closely.

## 2015-07-06 NOTE — Progress Notes (Addendum)
VASCULAR LAB PRELIMINARY  PRELIMINARY  PRELIMINARY  PRELIMINARY  Pre-op Cardiac Surgery  Carotid Findings:  Bilateral:  1-39% ICA stenosis.  Vertebral artery flow is antegrade.     Upper Extremity Right Left  Brachial Pressures 129 Triphasic 135 Triphasic  Radial Waveforms Triphasic Triphasic  Ulnar Waveforms Triphasic Triphasic  Palmar Arch (Allen's Test) Normal Normal   Findings:  Doppler waveforms remained normal bilaterally with both radial and ulnar compressions.    Lower  Extremity Right Left  Dorsalis Pedis 143 Triphsic 151 Triphasic  Posterior Tibial 141 Triphasic 145 Triphasic  Ankle/Brachial Indices 1.06 1.12    Findings:  ABIs and Doppler waveforms indicate normal arterial flow bilaterally at rest   Alexandra Lipps, RVS 07/06/2015, 7:00 PM

## 2015-07-06 NOTE — Progress Notes (Signed)
CARDIAC REHAB PHASE I   PRE:  Rate/Rhythm: 84 SR  BP:  Sitting: 115/77        SaO2: 93 RA  MODE:  Ambulation: 350 ft   POST:  Rate/Rhythm: 93 SR  BP:  Sitting: 130/79         SaO2: 95 RA  Pt ambulated 350 ft on RA, IV, assist x1, steady gait, tolerated well, no complaints. Pre-op education completed with pt and wife at bedside. Reviewed IS, sternal precautions, activity progression, cardiac surgery book and cardiac surgery guidelines, pt verbalized understanding. Pt in ICU with no phone in room to play cardiac surgery videos. Advised pt to request for RN to play videos if interested. Pt to bed for echo after walk, call bell within reach. Will follow.   [id number] Lenna Sciara, RN, BSN 07/06/2015 11:32 AM

## 2015-07-06 NOTE — Progress Notes (Signed)
Echocardiogram 2D Echocardiogram has been performed.  Clifford [name] 07/06/2015, 11:58 AM

## 2015-07-06 NOTE — Progress Notes (Signed)
Patient Name: Clifford [name] Date of Encounter: 07/06/2015  Primary Cardiologist: Dr. Johnsie Cancel  Patient profile: 70 yo male with h/o CAD, HTN, HLD presented with SSCP, initially STEMI called, but later cancelled. Admitted for unstable angina, underwent cath 07/04/2015 3v dx with 50% ost LCx, 995% mid LAD, 50% prox LAD, 80% RPDA. CT surgery consulted, plan for OR on Friday   Principal Problem:   Unstable angina ([place]) Active Problems:   Diet-controlled type 2 diabetes mellitus ([place])   Coronary atherosclerosis   Hyperlipidemia   Hypertensive heart disease    SUBJECTIVE  Denies any CP or SOB this morning. Some chest pain around 12:00AM this morning.   CURRENT MEDS . aspirin EC  324 mg Oral Once  . ezetimibe-simvastatin  1 tablet Oral Daily  . gi cocktail  30 mL Oral Once  . lisinopril  5 mg Oral Daily  . multivitamin with minerals  1 tablet Oral Daily  . sodium chloride flush  3 mL Intravenous Q12H  . sodium chloride flush  3 mL Intravenous Q12H    OBJECTIVE  Filed Vitals:   07/06/15 0400 07/06/15 0500 07/06/15 0600 07/06/15 0826  BP: 109/78 112/77 122/78 126/74  Pulse: 86 80 77 80  Temp: 98.6 F (37 C)   98.6 F (37 C)  TempSrc: Oral   Oral  Resp: 20 16 16 16   Height:      Weight: 176 lb 9.4 oz (80.1 kg)     SpO2: 96% 95% 96% 96%    Intake/Output Summary (Last 24 hours) at 07/06/15 0930 Last data filed at 07/06/15 0600  Gross per 24 hour  Intake   1311 ml  Output   1050 ml  Net    261 ml   Filed Weights   07/04/15 0600 07/05/15 0500 07/06/15 0400  Weight: 175 lb 7.8 oz (79.6 kg) 174 lb 12.8 oz (79.289 kg) 176 lb 9.4 oz (80.1 kg)    PHYSICAL EXAM  General: Pleasant, NAD. Neuro: Alert and oriented X 3. Moves all extremities spontaneously. Psych: Normal affect. HEENT:  Normal  Neck: Supple without bruits or JVD. Lungs:  Resp regular and unlabored, CTA. Heart: RRR no s3, s4, or murmurs. Noted R radial and R femoral hematoma Abdomen: Soft, non-tender,  non-distended, BS + x 4.  Extremities: No clubbing, cyanosis or edema. DP/PT/Radials 2+ and equal bilaterally.  Accessory Clinical Findings  CBC  Recent Labs  07/05/15 0517 07/06/15 0521  WBC 8.1 7.4  HGB 13.4 14.0  HCT 39.2 40.1  MCV 93.3 93.0  PLT 204 [id number]   Basic Metabolic Panel  Recent Labs  07/04/15 0415  NA 137  K 4.5  CL 101  CO2 25  GLUCOSE 168*  BUN 18  CREATININE 1.24  CALCIUM 9.2   Cardiac Enzymes  Recent Labs  07/03/15 0949 07/03/15 1443  TROPONINI <0.03 <0.03    TELE NSR    ECG  NSR with mildly elevated J point in Lateral leads    Radiology/Studies  Dg Chest 2 View  07/02/2015  CLINICAL DATA:  Midsternal chest pressure with onset last night. Sweating. Now pain radiates to the left side. History of diabetes and hypertension. EXAM: CHEST  2 VIEW COMPARISON:  04/12/2005 FINDINGS: Shallow inspiration with linear atelectasis or fibrosis in the lung bases. Normal heart size and pulmonary vascularity. No focal airspace disease or consolidation in the lungs. No blunting of costophrenic angles. No pneumothorax. Calcified and tortuous aorta. Degenerative changes in the spine. IMPRESSION: Shallow inspiration with linear atelectasis  or fibrosis in the lung bases similar to previous study. No evidence of active pulmonary disease. Electronically Signed   By: Lucienne Capers M.D.   On: 07/02/2015 06:55    ASSESSMENT AND PLAN  1. Unstable angina  - initially code STEMI called, but cancelled later. Loaded with plavix on 5/7  - cath 07/04/2015 50% ost LCx, 95% mid LAD which is heavily calcified and compromised origin of moderate sized diagonal, 50% prox LAD, 80% RPDA.   - plavix stopped, seen by CT surgery, planning for CABG.   - Recurrent CP last night, EKG minimal J point elevation in V6, similar to the EKG he came in with. CP resolved, if occurs too frequently, will start IV nitro. will discuss with MD, consider add 81mg  ASA and obtain echo either before CABG  to establish baseline and make sure no valvular problem.   - he has both R radial hematoma and R femoral hematoma, no bruit. Hands warm, appears to have good perfusion, will observe for now.  2. CAD  - Last cath in 2005 showed patent stents to the RCA and Circ wit h70% PDA disease in a small vessel.   - His last myovue in 2008 showed small inferobasal MI with no ischemia.  3. HTN: stable.   4. HLD: on vytorin  5. DM: obtain Hgb A1C  Signed, Woodward Ku Pager: [phone]   Agree with note by Almyra Deforest PA-C  Admitted with [place]. S/P Cath with 3VD. On IV hep. Minimal CP since cath. Plavix D/Cd and washing out. Labs OK. Exam benign. For CABG on Fri. Restart ASA 81 mg. 2D.  Tx Step down.   Lorretta Harp, M.D., [place], [facility], [place] [address] [place], [place]  [address]  [phone] 07/06/2015 10:18 AM

## 2015-07-06 NOTE — Progress Notes (Signed)
Patient ID: Clifford [name], male   DOB: 25-Nov-1945, 70 y.o.   MRN: [id number]      [address]       ,[place] [address]             [phone]                 2 Days Post-Op Procedure(s) (LRB): Left Heart Cath and Coronary Angiography (N/A)  LOS: 4 days   Subjective: Moved to step down, brief chest discomfort last night while dreaming of running   Objective: Vital signs in last 24 hours: Patient Vitals for the past 24 hrs:  BP Temp Temp src Pulse Resp SpO2 Weight  07/06/15 1617 128/75 mmHg 97.5 F (36.4 C) Oral 71 16 96 % -  07/06/15 1519 (!) 148/80 mmHg - - 79 - - -  07/06/15 1430 - - - 78 16 95 % -  07/06/15 1400 126/74 mmHg - - 84 16 94 % -  07/06/15 1300 124/81 mmHg - - 85 19 93 % -  07/06/15 1203 140/81 mmHg 97.8 F (36.6 C) Oral 87 18 95 % -  07/06/15 1200 (!) 123/111 mmHg - - 87 (!) 24 94 % -  07/06/15 1004 115/77 mmHg - - - - - -  07/06/15 1000 115/77 mmHg - - 98 19 94 % -  07/06/15 0900 (!) 143/98 mmHg - - 95 19 92 % -  07/06/15 0826 126/74 mmHg 98.6 F (37 C) Oral 80 16 96 % -  07/06/15 0800 126/74 mmHg - - 78 16 95 % -  07/06/15 0715 - - - 78 10 96 % -  07/06/15 0700 115/79 mmHg - - 83 18 95 % -  07/06/15 0600 122/78 mmHg - - 77 16 96 % -  07/06/15 0500 112/77 mmHg - - 80 16 95 % -  07/06/15 0400 109/78 mmHg 98.6 F (37 C) Oral 86 20 96 % 176 lb 9.4 oz (80.1 kg)  07/06/15 0300 113/76 mmHg - - 76 16 91 % -  07/06/15 0200 129/79 mmHg - - 83 (!) 21 95 % -  07/06/15 0100 120/83 mmHg - - 78 16 92 % -  07/06/15 0002 - 98.2 F (36.8 C) Oral - 19 96 % -  07/06/15 0000 130/90 mmHg - - 89 (!) 21 92 % -  07/05/15 2200 121/78 mmHg - - 79 18 91 % -  07/05/15 2100 130/73 mmHg - - 81 17 94 % -  07/05/15 2000 137/83 mmHg - - 88 (!) 24 93 % -  07/05/15 1934 - 97.8 F (36.6 C) Oral - - - -  07/05/15 1900 136/88 mmHg - - 96 16 94 % -  07/05/15 1800 139/78 mmHg - - 92 (!) 24 95 % -    Filed Weights   07/04/15 0600 07/05/15 0500 07/06/15 0400  Weight:  175 lb 7.8 oz (79.6 kg) 174 lb 12.8 oz (79.289 kg) 176 lb 9.4 oz (80.1 kg)    Hemodynamic parameters for last 24 hours:    Intake/Output from previous day: 05/09 0701 - 05/10 0700 In: 1464 [P.O.:1200; I.V.:264] Out: 1050 [Urine:1050] Intake/Output this shift: Total I/O In: 969 [P.O.:870; I.V.:99] Out: 400 [Urine:400]  Scheduled Meds: . aspirin  81 mg Oral Daily  . aspirin EC  324 mg Oral Once  . ezetimibe-simvastatin  1 tablet Oral Daily  . gi cocktail  30 mL Oral Once  . lisinopril  5 mg Oral Daily  .  metoprolol tartrate  12.5 mg Oral BID  . multivitamin with minerals  1 tablet Oral Daily  . sodium chloride flush  3 mL Intravenous Q12H  . sodium chloride flush  3 mL Intravenous Q12H   Continuous Infusions: . heparin 1,100 Units/hr (07/05/15 2011)  . nitroGLYCERIN Stopped (07/05/15 0700)   PRN Meds:.sodium chloride, sodium chloride, acetaminophen, ALPRAZolam, alum & mag hydroxide-simeth, morphine injection, nitroGLYCERIN, ondansetron (ZOFRAN) IV, sodium chloride flush, sodium chloride flush  General appearance: alert and cooperative Neurologic: intact Heart: regular rate and rhythm, S1, S2 normal, no murmur, click, rub or gallop Lungs: clear to auscultation bilaterally Abdomen: soft, non-tender; bowel sounds normal; no masses,  no organomegaly Extremities: extremities normal, atraumatic, no cyanosis or edema and Homans sign is negative, no sign of DVT  Lab Results: CBC: Recent Labs  07/05/15 0517 07/06/15 0521  WBC 8.1 7.4  HGB 13.4 14.0  HCT 39.2 40.1  PLT 204 216   BMET:  Recent Labs  07/04/15 0415  NA 137  K 4.5  CL 101  CO2 25  GLUCOSE 168*  BUN 18  CREATININE 1.24  CALCIUM 9.2    PT/INR:  Recent Labs  07/04/15 0415  LABPROT 13.3  INR 0.99     Radiology No results found.   Assessment/Plan: S/P Procedure(s) (LRB): Left Heart Cath and Coronary Angiography (N/A) Mobilize Plan p2y12 in am , poss surgery Friday   [name]  MD 07/06/2015 5:48 PM

## 2015-07-07 ENCOUNTER — Inpatient Hospital Stay (HOSPITAL_COMMUNITY): Payer: 59

## 2015-07-07 DIAGNOSIS — R609 Edema, unspecified: Secondary | ICD-10-CM

## 2015-07-07 DIAGNOSIS — I110 Hypertensive heart disease with heart failure: Secondary | ICD-10-CM

## 2015-07-07 LAB — BASIC METABOLIC PANEL
Anion gap: 13 (ref 5–15)
BUN: 16 mg/dL (ref 6–20)
CO2: 24 mmol/L (ref 22–32)
Calcium: 9.4 mg/dL (ref 8.9–10.3)
Chloride: 102 mmol/L (ref 101–111)
Creatinine, Ser: 1.22 mg/dL (ref 0.61–1.24)
GFR calc Af Amer: >60 mL/min (ref >60)
GFR calc non Af Amer: 57 mL/min — ABNORMAL LOW (ref >60)
Glucose, Bld: 134 mg/dL — ABNORMAL HIGH (ref 65–99)
Potassium: 3.9 mmol/L (ref 3.5–5.1)
Sodium: 139 mmol/L (ref 135–145)

## 2015-07-07 LAB — SPIROMETRY WITH GRAPH
FEF 25-75 Post: 2.13 L/sec
FEF 25-75 Pre: 2.82 L/sec
FEF2575-%Change-Post: -24 %
FEF2575-%Pred-Post: 116 %
FEF2575-%Pred-Pre: 153 %
FEV1-%Change-Post: 0 %
FEV1-%Pred-Post: 101 %
FEV1-%Pred-Pre: 102 %
FEV1-Post: 2.48 L
FEV1-Pre: 2.51 L
FEV1FVC-%Change-Post: 0 %
FEV1FVC-%Pred-Pre: 104 %
FEV6-%Change-Post: -1 %
FEV6-%Pred-Post: 101 %
FEV6-%Pred-Pre: 103 %
FEV6-Post: 3.21 L
FEV6-Pre: 3.27 L
FEV6FVC-%Change-Post: 0 %
FEV6FVC-%Pred-Post: 107 %
FEV6FVC-%Pred-Pre: 107 %
FVC-%Change-Post: -1 %
FVC-%Pred-Post: 95 %
FVC-%Pred-Pre: 96 %
FVC-Post: 3.22 L
FVC-Pre: 3.27 L
Post FEV1/FVC ratio: 77 %
Post FEV6/FVC ratio: 100 %
Pre FEV1/FVC ratio: 77 %
Pre FEV6/FVC Ratio: 100 %

## 2015-07-07 LAB — SURGICAL PCR SCREEN
MRSA, PCR: NEGATIVE
Staphylococcus aureus: NEGATIVE

## 2015-07-07 LAB — TYPE AND SCREEN
ABO/RH(D): O NEG
Antibody Screen: NEGATIVE

## 2015-07-07 LAB — CBC
HCT: 41.5 % (ref 39.0–52.0)
Hemoglobin: 14.4 g/dL (ref 13.0–17.0)
MCH: 32.4 pg (ref 26.0–34.0)
MCHC: 34.7 g/dL (ref 30.0–36.0)
MCV: 93.3 fL (ref 78.0–100.0)
PLATELETS: 237 10*3/uL (ref 150–400)
RBC: 4.45 MIL/uL (ref 4.22–5.81)
RDW: 13.1 % (ref 11.5–15.5)
WBC: 9.1 10*3/uL (ref 4.0–10.5)

## 2015-07-07 LAB — HEPARIN LEVEL (UNFRACTIONATED): Heparin Unfractionated: 0.55 IU/mL (ref 0.30–0.70)

## 2015-07-07 LAB — PLATELET INHIBITION P2Y12: Platelet Function  P2Y12: 181 [PRU] — ABNORMAL LOW (ref 194–418)

## 2015-07-07 LAB — ABO/RH: ABO/RH(D): O NEG

## 2015-07-07 MED ORDER — DEXTROSE 5 % IV SOLN
1.5000 g | INTRAVENOUS | Status: DC
  Administered 2015-07-08: 1.5 g via INTRAVENOUS
  Administered 2015-07-08: .75 g via INTRAVENOUS
  Filled 2015-07-07 (×2): qty 1.5

## 2015-07-07 MED ORDER — DOPAMINE-DEXTROSE 3.2-5 MG/ML-% IV SOLN
0.0000 ug/kg/min | INTRAVENOUS | Status: DC
  Filled 2015-07-07: qty 250

## 2015-07-07 MED ORDER — POTASSIUM CHLORIDE 2 MEQ/ML IV SOLN
80.0000 meq | INTRAVENOUS | Status: DC
  Filled 2015-07-07: qty 40

## 2015-07-07 MED ORDER — VANCOMYCIN HCL 10 G IV SOLR
1250.0000 mg | INTRAVENOUS | Status: DC
  Administered 2015-07-08: 1250 mg via INTRAVENOUS
  Filled 2015-07-07: qty 1250

## 2015-07-07 MED ORDER — CHLORHEXIDINE GLUCONATE 4 % EX LIQD
CUTANEOUS | Status: AC
  Administered 2015-07-07: 21:00:00
  Filled 2015-07-07: qty 15

## 2015-07-07 MED ORDER — DOCUSATE SODIUM 100 MG PO CAPS
100.0000 mg | ORAL_CAPSULE | Freq: Two times a day (BID) | ORAL | Status: DC | PRN
  Administered 2015-07-07: 100 mg via ORAL
  Filled 2015-07-07: qty 1

## 2015-07-07 MED ORDER — DEXTROSE 5 % IV SOLN
750.0000 mg | INTRAVENOUS | Status: DC
  Filled 2015-07-07: qty 750

## 2015-07-07 MED ORDER — DEXTROSE 5 % IV SOLN
30.0000 ug/min | INTRAVENOUS | Status: DC
  Filled 2015-07-07: qty 2

## 2015-07-07 MED ORDER — DEXTROSE 5 % IV SOLN
0.0000 ug/min | INTRAVENOUS | Status: DC
  Filled 2015-07-07: qty 4

## 2015-07-07 MED ORDER — PAPAVERINE HCL 30 MG/ML IJ SOLN
INTRAMUSCULAR | Status: DC
  Filled 2015-07-07: qty 2.5

## 2015-07-07 MED ORDER — SODIUM CHLORIDE 0.9 % IV SOLN
INTRAVENOUS | Status: DC
  Filled 2015-07-07: qty 30

## 2015-07-07 MED ORDER — SODIUM CHLORIDE 0.9 % IV SOLN
INTRAVENOUS | Status: DC
  Administered 2015-07-08: 69.8 mL/h via INTRAVENOUS
  Filled 2015-07-07: qty 40

## 2015-07-07 MED ORDER — DEXMEDETOMIDINE HCL IN NACL 400 MCG/100ML IV SOLN
0.1000 ug/kg/h | INTRAVENOUS | Status: DC
  Administered 2015-07-08: .2 ug/kg/h via INTRAVENOUS
  Filled 2015-07-07: qty 100

## 2015-07-07 MED ORDER — MAGNESIUM SULFATE 50 % IJ SOLN
40.0000 meq | INTRAMUSCULAR | Status: DC
  Filled 2015-07-07: qty 10

## 2015-07-07 MED ORDER — SODIUM CHLORIDE 0.9 % IV SOLN
INTRAVENOUS | Status: DC
  Administered 2015-07-08: 2.1 [IU]/h via INTRAVENOUS
  Filled 2015-07-07: qty 2.5

## 2015-07-07 MED ORDER — ALBUTEROL SULFATE (2.5 MG/3ML) 0.083% IN NEBU
2.5000 mg | INHALATION_SOLUTION | Freq: Once | RESPIRATORY_TRACT | Status: AC
  Administered 2015-07-07: 2.5 mg via RESPIRATORY_TRACT

## 2015-07-07 MED ORDER — NITROGLYCERIN IN D5W 200-5 MCG/ML-% IV SOLN
2.0000 ug/min | INTRAVENOUS | Status: DC
  Filled 2015-07-07: qty 250

## 2015-07-07 NOTE — Progress Notes (Signed)
Pt reported that R groin site felt tighter after ambulation today. On assessment, Bruising noted to be darker that 8 am assessment and hematoma felt somewhat larger. Pressure held for about ten minutes and site feels softer. No oozing noted at cath site. Clifford Deforest PA paged to assess. U/S ordered. Will continue to monitor closely. Pt advised to refrain from ambulation until U/S verification.

## 2015-07-07 NOTE — Progress Notes (Signed)
Discussed with U/S tech, it was positive for a small pseudoaneurysm, however due to localized swelling unable to fully image. Majority of the swelling appears to be soft tissue swelling instead of pseudoaneurysm, per U/S tech, unable to perform U/S guided compression due to swelling. Will monitor this for now, if swelling or pain increase, will need to repeat U/S or even consider thrombin injection. Hopefully, it will resolve by itself. Informed Dr. Harrington Challenger  Signed, Almyra Deforest PA Pager: [phone]

## 2015-07-07 NOTE — Progress Notes (Signed)
VASCULAR LAB PRELIMINARY  PRELIMINARY  PRELIMINARY  PRELIMINARY  Rt groin duplex for pseudoaneurysm completed.    Preliminary report:  Positive for a small pseudoaneurysm. Unable to locate , image, or evaluate fully due to severe swelling at the site. There is a definite Doppler signal for a pseudoaneurysm. Unable to identify the source of the pseudoaneurysm  Clifford [name], [place], RVS 07/07/2015, 4:58 PM

## 2015-07-07 NOTE — Consult Note (Signed)
Urology Consult  Referring physician: Dr. Servando Snare Reason for referral: hx of bladder neck contracture, difficult foley  Chief Complaint: weak urinary stream  History of Present Illness: [name] is a 70yo with a hx of prostate cancer status post robotic radical prostatectomy who presented with angina a NSTEMI. He is scheduled to undergo CABG tomorrow and urology was consulted for foley catheter placement due to history of prostatectomy and bladder neck contracture. He is followed by Dr. Alinda Money for his prostate cancer and hx of microhematuria. He has had a previous difficult catheterization due to bladder neck contracture. He has refused office cystoscopy for workup of microhematuria. Currently he a weaker urinary stream compared to his baseline. He denies any SUI. No urgency, frequency, or dysuria. No gross hematuria  Past Medical History  Diagnosis Date  . Hyperlipidemia   . Hypertension   . History of detached retina repair 11-06-2006    [facility]  . CAD (coronary artery disease) 2005    Previous stents RCA and circ, Cath 2005 patient with 70% PDA  . History of prostate cancer 03/2005  . History of hematuria   . Cataract     replace 11/2012  . Diabetes mellitus     diet controlled  . Anginal pain [facility])    Past Surgical History  Procedure Laterality Date  . Prostatectomy  03/2005  . Ptca  2005  . Lipoma excision    . Wrist ganglion excision    . Hemorrhoid surgery      I&D of thrombosed hemorrhoid  . Cardiac catheterization  05.31.2002    Initially the stenosis in the proximal to mid right coronary artery was estimated ar 95%. Following stenting, this improved to 0%. There was residual mild disease in the sostium of the proximal right coronary atrtery. Successful stentng of the proximal to mid right coronary artery with improvement in stent diameter narrowing from 95% to 0% Proc Date  07/29/2000  . Colonoscopy    . Eye surgery  11/24/2012    left eye cataract  . Eye surgery   12/22/2012    right eye cataract  . Cataract extraction w/ intraocular lens implant Bilateral OS Sept '14; OD Oct '14    Dr. Bing Plume  . Angioplasty      1995,2000,2002  . Cardiac catheterization N/A 07/04/2015    Procedure: Left Heart Cath and Coronary Angiography;  Surgeon: Jettie Booze, MD;  Location: [facility];  Service: Cardiovascular;  Laterality: N/A;    Medications: I have reviewed the patient's current medications. Allergies:  Allergies  Allergen Reactions  . Cinnamon Other (See Comments)    Stomach ache     Family History  Problem Relation Age of Onset  . Diabetes Sister   . Cancer Neg Hx     Colon  . Colon cancer Neg Hx   . Esophageal cancer Neg Hx   . Rectal cancer Neg Hx   . Stomach cancer Neg Hx   . CAD Father   . Colon polyps Sister    Social History:  reports that he quit smoking about 32 years ago. He has never used smokeless tobacco. He reports that he does not drink alcohol or use illicit drugs.  Review of Systems  Cardiovascular: Positive for chest pain.  Neurological: Positive for weakness.  All other systems reviewed and are negative.   Physical Exam:  Vital signs in last 24 hours: Temp:  [97.9 F (36.6 C)-98.7 F (37.1 C)] 98 F (36.7 C) (05/11 1700) Pulse  Rate:  [74-89] 86 (05/11 0800) Resp:  [11-22] 15 (05/11 1700) BP: (112-141)/(60-82) 141/74 mmHg (05/11 1700) SpO2:  [94 %-97 %] 95 % (05/11 1700) Weight:  [76.6 kg (168 lb 14 oz)] 76.6 kg (168 lb 14 oz) (05/11 0409) Physical Exam  Constitutional: He is oriented to person, place, and time. He appears well-developed and well-nourished.  HENT:  Head: Normocephalic and atraumatic.  Eyes: EOM are normal. Pupils are equal, round, and reactive to light.  Neck: Normal range of motion. No thyromegaly present.  Cardiovascular: Normal rate and regular rhythm.   Respiratory: Effort normal. No respiratory distress.  GI: Soft. He exhibits no distension and no mass. There is no tenderness.  There is no rebound and no guarding. Hernia confirmed negative in the right inguinal area and confirmed negative in the left inguinal area.  Genitourinary: Testes normal and penis normal.  Musculoskeletal: Normal range of motion.  Lymphadenopathy:       Right: No inguinal adenopathy present.       Left: No inguinal adenopathy present.  Neurological: He is alert and oriented to person, place, and time.  Skin: Skin is warm and dry.  Psychiatric: He has a normal mood and affect. His behavior is normal. Judgment and thought content normal.    Laboratory Data:  Results for orders placed or performed during the hospital encounter of 07/02/15 (from the past 72 hour(s))  CBC     Status: Abnormal   Collection Time: 07/05/15  5:17 AM  Result Value Ref Range   WBC 8.1 4.0 - 10.5 K/uL   RBC 4.20 (L) 4.22 - 5.81 MIL/uL   Hemoglobin 13.4 13.0 - 17.0 g/dL   HCT 39.2 39.0 - 52.0 %   MCV 93.3 78.0 - 100.0 fL   MCH 31.9 26.0 - 34.0 pg   MCHC 34.2 30.0 - 36.0 g/dL   RDW 13.1 11.5 - 15.5 %   Platelets 204 150 - 400 K/uL  Heparin level (unfractionated)     Status: Abnormal   Collection Time: 07/05/15  5:17 AM  Result Value Ref Range   Heparin Unfractionated 0.27 (L) 0.30 - 0.70 IU/mL    Comment:        IF HEPARIN RESULTS ARE BELOW EXPECTED VALUES, AND PATIENT DOSAGE HAS BEEN CONFIRMED, SUGGEST FOLLOW UP TESTING OF ANTITHROMBIN III LEVELS.   Platelet inhibition p2y12 (Not at [facility])     Status: Abnormal   Collection Time: 07/05/15 10:59 AM  Result Value Ref Range   Platelet Function  P2Y12 98 (L) 194 - 418 PRU    Comment:        The literature has shown a direct correlation of PRU values over 230 with higher risks of thrombotic events.  Lower PRU values are associated with platelet inhibition.   Heparin level (unfractionated)     Status: None   Collection Time: 07/05/15  3:02 PM  Result Value Ref Range   Heparin Unfractionated 0.43 0.30 - 0.70 IU/mL    Comment:        IF HEPARIN RESULTS  ARE BELOW EXPECTED VALUES, AND PATIENT DOSAGE HAS BEEN CONFIRMED, SUGGEST FOLLOW UP TESTING OF ANTITHROMBIN III LEVELS.   Glucose, capillary     Status: Abnormal   Collection Time: 07/05/15  4:40 PM  Result Value Ref Range   Glucose-Capillary 122 (H) 65 - 99 mg/dL   Comment 1 Capillary Specimen   Heparin level (unfractionated)     Status: None   Collection Time: 07/05/15 10:01 PM  Result Value Ref  Range   Heparin Unfractionated 0.50 0.30 - 0.70 IU/mL    Comment:        IF HEPARIN RESULTS ARE BELOW EXPECTED VALUES, AND PATIENT DOSAGE HAS BEEN CONFIRMED, SUGGEST FOLLOW UP TESTING OF ANTITHROMBIN III LEVELS.   CBC     Status: None   Collection Time: 07/06/15  5:21 AM  Result Value Ref Range   WBC 7.4 4.0 - 10.5 K/uL   RBC 4.31 4.22 - 5.81 MIL/uL   Hemoglobin 14.0 13.0 - 17.0 g/dL   HCT 40.1 39.0 - 52.0 %   MCV 93.0 78.0 - 100.0 fL   MCH 32.5 26.0 - 34.0 pg   MCHC 34.9 30.0 - 36.0 g/dL   RDW 13.1 11.5 - 15.5 %   Platelets 216 150 - 400 K/uL  Heparin level (unfractionated)     Status: None   Collection Time: 07/06/15  5:21 AM  Result Value Ref Range   Heparin Unfractionated 0.47 0.30 - 0.70 IU/mL    Comment:        IF HEPARIN RESULTS ARE BELOW EXPECTED VALUES, AND PATIENT DOSAGE HAS BEEN CONFIRMED, SUGGEST FOLLOW UP TESTING OF ANTITHROMBIN III LEVELS.   CBC     Status: None   Collection Time: 07/07/15  3:31 AM  Result Value Ref Range   WBC 9.1 4.0 - 10.5 K/uL   RBC 4.45 4.22 - 5.81 MIL/uL   Hemoglobin 14.4 13.0 - 17.0 g/dL   HCT 41.5 39.0 - 52.0 %   MCV 93.3 78.0 - 100.0 fL   MCH 32.4 26.0 - 34.0 pg   MCHC 34.7 30.0 - 36.0 g/dL   RDW 13.1 11.5 - 15.5 %   Platelets 237 150 - 400 K/uL  Heparin level (unfractionated)     Status: None   Collection Time: 07/07/15  3:31 AM  Result Value Ref Range   Heparin Unfractionated 0.55 0.30 - 0.70 IU/mL    Comment:        IF HEPARIN RESULTS ARE BELOW EXPECTED VALUES, AND PATIENT DOSAGE HAS BEEN CONFIRMED, SUGGEST FOLLOW UP  TESTING OF ANTITHROMBIN III LEVELS.   Platelet inhibition p2y12 (Not at [facility])     Status: Abnormal   Collection Time: 07/07/15  3:31 AM  Result Value Ref Range   Platelet Function  P2Y12 181 (L) 194 - 418 PRU    Comment:        The literature has shown a direct correlation of PRU values over 230 with higher risks of thrombotic events.  Lower PRU values are associated with platelet inhibition.   Basic metabolic panel     Status: Abnormal   Collection Time: 07/07/15  3:31 AM  Result Value Ref Range   Sodium 139 135 - 145 mmol/L   Potassium 3.9 3.5 - 5.1 mmol/L   Chloride 102 101 - 111 mmol/L   CO2 24 22 - 32 mmol/L   Glucose, Bld 134 (H) 65 - 99 mg/dL   BUN 16 6 - 20 mg/dL   Creatinine, Ser 1.22 0.61 - 1.24 mg/dL   Calcium 9.4 8.9 - 10.3 mg/dL   GFR calc non Af Amer 57 (L) >60 mL/min   GFR calc Af Amer >60 >60 mL/min    Comment: (NOTE) The eGFR has been calculated using the CKD EPI equation. This calculation has not been validated in all clinical situations. eGFR's persistently <60 mL/min signify possible Chronic Kidney Disease.    Anion gap 13 5 - 15  Type and screen [place]  Status: None   Collection Time: 07/07/15  3:39 AM  Result Value Ref Range   ABO/RH(D) O NEG    Antibody Screen NEG    Sample Expiration 07/10/2015   ABO/Rh     Status: None   Collection Time: 07/07/15  3:39 AM  Result Value Ref Range   ABO/RH(D) O NEG   Surgical pcr screen     Status: None   Collection Time: 07/07/15  3:41 AM  Result Value Ref Range   MRSA, PCR NEGATIVE NEGATIVE   Staphylococcus aureus NEGATIVE NEGATIVE    Comment:        The Xpert SA Assay (FDA approved for NASAL specimens in patients over 43 years of age), is one component of a comprehensive surveillance program.  Test performance has been validated by [facility] for patients greater than or equal to 1 year old. It is not intended to diagnose infection nor to guide or monitor treatment.     Recent Results (from the past 240 hour(s))  MRSA PCR Screening     Status: None   Collection Time: 07/02/15 11:43 AM  Result Value Ref Range Status   MRSA by PCR NEGATIVE NEGATIVE Final    Comment:        The GeneXpert MRSA Assay (FDA approved for NASAL specimens only), is one component of a comprehensive MRSA colonization surveillance program. It is not intended to diagnose MRSA infection nor to guide or monitor treatment for MRSA infections.   Surgical pcr screen     Status: None   Collection Time: 07/07/15  3:41 AM  Result Value Ref Range Status   MRSA, PCR NEGATIVE NEGATIVE Final   Staphylococcus aureus NEGATIVE NEGATIVE Final    Comment:        The Xpert SA Assay (FDA approved for NASAL specimens in patients over 32 years of age), is one component of a comprehensive surveillance program.  Test performance has been validated by [facility] for patients greater than or equal to 55 year old. It is not intended to diagnose infection nor to guide or monitor treatment.    Creatinine:  Recent Labs  07/02/15 0611 07/03/15 0255 07/04/15 0415 07/07/15 0331  CREATININE 1.21 1.24 1.24 1.22   Baseline Creatinine: 1.2  Impression/Assessment:  70yo with hx of prostate cancer, difficult foley placement, microhematuria  Plan:  1. I discussed preoperative foley catheter placement with the patient prior to his CABG. The patient refused foley placement while he is awake due to his previous catheterization which was painful. Please attempt to place a 12-14 french foley in the operating room and if the foley cannot be placed please contact Urology.   Nicolette Bang 07/07/2015, 8:36 PM

## 2015-07-07 NOTE — Progress Notes (Signed)
Pt and family given information on cardiac surgery including booklet and incentive spirometry fact sheet. Pt able to teach back information and use IS.

## 2015-07-07 NOTE — Progress Notes (Signed)
Patient Name: Clifford [name] Date of Encounter: 07/07/2015  Primary Cardiologist: Dr. Johnsie Cancel  Patient profile: 70 yo male with h/o CAD, HTN, HLD presented with SSCP, initially STEMI called, but later cancelled. Admitted for unstable angina, underwent cath 07/04/2015 3v dx with 50% ost LCx, 995% mid LAD, 50% prox LAD, 80% RPDA. CT surgery consulted, plan for OR on Friday   Principal Problem:   Unstable angina ([place]) Active Problems:   Diet-controlled type 2 diabetes mellitus ([place])   Coronary atherosclerosis   Hyperlipidemia   Hypertensive heart disease    SUBJECTIVE  Denies any CP or SOB. Felt he has some congestion.  CURRENT MEDS . aspirin  81 mg Oral Daily  . aspirin EC  324 mg Oral Once  . ezetimibe-simvastatin  1 tablet Oral Daily  . gi cocktail  30 mL Oral Once  . lisinopril  5 mg Oral Daily  . metoprolol tartrate  12.5 mg Oral BID  . multivitamin with minerals  1 tablet Oral Daily  . sodium chloride flush  3 mL Intravenous Q12H  . sodium chloride flush  3 mL Intravenous Q12H    OBJECTIVE  Filed Vitals:   07/07/15 0400 07/07/15 0409 07/07/15 0759 07/07/15 0800  BP: 112/60 112/60 136/82 130/64  Pulse: 77 74 89 86  Temp:  98.7 F (37.1 C) 97.9 F (36.6 C)   TempSrc:  Oral Oral   Resp: 22 11 16 19   Height:      Weight:  168 lb 14 oz (76.6 kg)    SpO2: 94% 94% 94% 94%    Intake/Output Summary (Last 24 hours) at 07/07/15 0903 Last data filed at 07/07/15 0900  Gross per 24 hour  Intake   1247 ml  Output   1375 ml  Net   -128 ml   Filed Weights   07/05/15 0500 07/06/15 0400 07/07/15 0409  Weight: 174 lb 12.8 oz (79.289 kg) 176 lb 9.4 oz (80.1 kg) 168 lb 14 oz (76.6 kg)    PHYSICAL EXAM  General: Pleasant, NAD. Neuro: Alert and oriented X 3. Moves all extremities spontaneously. Psych: Normal affect. HEENT:  Normal  Neck: Supple without bruits or JVD. Lungs:  Resp regular and unlabored, CTA. Heart: RRR no s3, s4, or murmurs. Noted R radial and R femoral  hematoma Abdomen: Soft, non-tender, non-distended, BS + x 4.  Extremities: No clubbing, cyanosis or edema. DP/PT/Radials 2+ and equal bilaterally.  Accessory Clinical Findings  CBC  Recent Labs  07/06/15 0521 07/07/15 0331  WBC 7.4 9.1  HGB 14.0 14.4  HCT 40.1 41.5  MCV 93.0 93.3  PLT 216 [id number]   Basic Metabolic Panel  Recent Labs  07/07/15 0331  NA 139  K 3.9  CL 102  CO2 24  GLUCOSE 134*  BUN 16  CREATININE 1.22  CALCIUM 9.4    TELE NSR without significant ventricular ectopy    ECG  No new EKG   Radiology/Studies  Dg Chest 2 View  07/02/2015  CLINICAL DATA:  Midsternal chest pressure with onset last night. Sweating. Now pain radiates to the left side. History of diabetes and hypertension. EXAM: CHEST  2 VIEW COMPARISON:  04/12/2005 FINDINGS: Shallow inspiration with linear atelectasis or fibrosis in the lung bases. Normal heart size and pulmonary vascularity. No focal airspace disease or consolidation in the lungs. No blunting of costophrenic angles. No pneumothorax. Calcified and tortuous aorta. Degenerative changes in the spine. IMPRESSION: Shallow inspiration with linear atelectasis or fibrosis in the lung bases similar to  previous study. No evidence of active pulmonary disease. Electronically Signed   By: Lucienne Capers M.D.   On: 07/02/2015 06:55    ASSESSMENT AND PLAN  1. Unstable angina  - initially code STEMI called, but cancelled later. Loaded with plavix on 5/7  - cath 07/04/2015 50% ost LCx, 95% mid LAD which is heavily calcified and compromised origin of moderate sized diagonal, 50% prox LAD, 80% RPDA.   - echo shows normal EF, no significant valvular problem.   - he has both R radial hematoma and R femoral hematoma, no bruit. Hands warm, appears to have good perfusion, will observe for now.  - ASA and low dose BB added yesterday. Undergoing plavix washout, planning for CABG Friday  2. CAD  - Last cath in 2005 showed patent stents to the RCA and  Circ wit h70% PDA disease in a small vessel.   - His last myovue in 2008 showed small inferobasal MI with no ischemia.  3. HTN: stable.   4. HLD: on vytorin  5. DM: obtain Hgb A1C  Signed, Woodward Ku Pager: [phone]  Pt seen and examined  I agree with findings as noted by Janan Ridge above  Pt denies CP ON exam:  LUngs CTA  Cardiac RRR  No murmur  Ext  R groin with hematoma  No bruit   WIll get USN to eval since some bigger Surg when P2Y12 activty adequate.  Dorris Carnes

## 2015-07-07 NOTE — Progress Notes (Signed)
Patient ID: Clifford [name], male   DOB: 1945/06/11, 70 y.o.   MRN: [id number]      [address]       [place] [address]             [phone]                 3 Days Post-Op Procedure(s) (LRB): Left Heart Cath and Coronary Angiography (N/A)  LOS: 5 days   Subjective: No chest pain last pm, gives history of hematura after prostate surgery, cardiology checking rt groin for hemtoma  Objective: Vital signs in last 24 hours: Patient Vitals for the past 24 hrs:  BP Temp Temp src Pulse Resp SpO2 Weight  07/07/15 1154 125/71 mmHg 98 F (36.7 C) Oral - 18 94 % -  07/07/15 0800 130/64 mmHg - - 86 19 94 % -  07/07/15 0759 136/82 mmHg 97.9 F (36.6 C) Oral 89 16 94 % -  07/07/15 0409 112/60 mmHg 98.7 F (37.1 C) Oral 74 11 94 % 168 lb 14 oz (76.6 kg)  07/07/15 0400 112/60 mmHg - - 77 (!) 22 94 % -  07/06/15 2337 137/81 mmHg 98.7 F (37.1 C) Oral 78 17 97 % -  07/06/15 2014 134/81 mmHg 98.3 F (36.8 C) Oral 81 16 96 % -  07/06/15 2000 - - - 75 (!) 21 95 % -  07/06/15 1935 136/84 mmHg - - 78 18 96 % -  07/06/15 1700 130/81 mmHg - - 74 18 96 % -  07/06/15 1617 128/75 mmHg 97.5 F (36.4 C) Oral 71 16 96 % -  07/06/15 1600 125/68 mmHg - - 72 15 94 % -  07/06/15 1519 (!) 148/80 mmHg - - 79 - - -    Filed Weights   07/05/15 0500 07/06/15 0400 07/07/15 0409  Weight: 174 lb 12.8 oz (79.289 kg) 176 lb 9.4 oz (80.1 kg) 168 lb 14 oz (76.6 kg)    Hemodynamic parameters for last 24 hours:    Intake/Output from previous day: 05/10 0701 - 05/11 0700 In: 1276 [P.O.:1020; I.V.:256] Out: 1350 [Urine:1350] Intake/Output this shift: Total I/O In: 528 [P.O.:440; I.V.:88] Out: 275 [Urine:275]  Scheduled Meds: . albuterol  2.5 mg Nebulization Once  . [START ON 07/08/2015] aminocaproic acid (AMICAR) for OHS   Intravenous To OR  . aspirin  81 mg Oral Daily  . aspirin EC  324 mg Oral Once  . [START ON 07/08/2015] cefUROXime (ZINACEF)  IV  1.5 g Intravenous To OR  . [START ON  07/08/2015] cefUROXime (ZINACEF)  IV  750 mg Intravenous To OR  . [START ON 07/08/2015] dexmedetomidine  0.1-0.7 mcg/kg/hr Intravenous To OR  . [START ON 07/08/2015] DOPamine  0-10 mcg/kg/min Intravenous To OR  . [START ON 07/08/2015] epinephrine  0-10 mcg/min Intravenous To OR  . ezetimibe-simvastatin  1 tablet Oral Daily  . gi cocktail  30 mL Oral Once  . [START ON 07/08/2015] heparin-papaverine-plasmalyte irrigation   Irrigation To OR  . [START ON 07/08/2015] heparin 30,000 units/NS 1000 mL solution for CELLSAVER   Other To OR  . [START ON 07/08/2015] insulin (NOVOLIN-R) infusion   Intravenous To OR  . lisinopril  5 mg Oral Daily  . [START ON 07/08/2015] magnesium sulfate  40 mEq Other To OR  . metoprolol tartrate  12.5 mg Oral BID  . multivitamin with minerals  1 tablet Oral Daily  . [START ON 07/08/2015] nitroGLYCERIN  2-200 mcg/min Intravenous To OR  . [  START ON 07/08/2015] phenylephrine (NEO-SYNEPHRINE) Adult infusion  30-200 mcg/min Intravenous To OR  . [START ON 07/08/2015] potassium chloride  80 mEq Other To OR  . sodium chloride flush  3 mL Intravenous Q12H  . sodium chloride flush  3 mL Intravenous Q12H  . [START ON 07/08/2015] vancomycin  1,250 mg Intravenous To OR   Continuous Infusions: . heparin 1,100 Units/hr (07/06/15 2000)  . nitroGLYCERIN Stopped (07/05/15 0700)   PRN Meds:.sodium chloride, sodium chloride, acetaminophen, ALPRAZolam, alum & mag hydroxide-simeth, docusate sodium, morphine injection, nitroGLYCERIN, ondansetron (ZOFRAN) IV, sodium chloride flush, sodium chloride flush    Lab Results: CBC: Recent Labs  07/06/15 0521 07/07/15 0331  WBC 7.4 9.1  HGB 14.0 14.4  HCT 40.1 41.5  PLT 216 237   BMET:  Recent Labs  07/07/15 0331  NA 139  K 3.9  CL 102  CO2 24  GLUCOSE 134*  BUN 16  CREATININE 1.22  CALCIUM 9.4    PT/INR: No results for input(s): LABPROT, INR in the last 72 hours.   Radiology No results found.   Assessment/Plan: S/P Procedure(s)  (LRB): Left Heart Cath and Coronary Angiography (N/A)  Plan cabg tomorrow, p2y12 now almost 200 and no Plavix since Monday. Plan  To proceed with cabg in am The goals risks and alternatives of the planned surgical procedure CABG  have been discussed with the patient in detail. The risks of the procedure including death, infection, stroke, myocardial infarction, bleeding, blood transfusion have all been discussed specifically.  I have quoted Clifford [name] a 3 % of perioperative mortality and a complication rate as high as 40 %. The patient's questions have been answered.Clifford [name] is willing  to proceed with the planned procedure. In addition to other potential risks and complications from the surgery, I have made the patient aware of the recent [place] concerning the risk of infection by Myocobacterium chimaera related to the use of [place] 3T heater-cooler equipment during cardiac surgery. I discussed with the patient the low risk of infection, as well as our compliance with the most current FDA recommendations to minimize infection and testing of all devices for contamination. The patient has been made aware of the limited alternatives to immediately replacing the current equipment. The patient has been informed regarding the risks associated with waiting to proceed with needed surgery and that such risks are greater than the risk of infection related to the use of the heater-cooler device. I did make the patient aware that after careful review of the patients having cardiac surgery at [facility] we have no evidence that heater/cooler related infections have occurred at [facility]. We discussed that this is a slow-growing bacterium, such that it can take some period of time for symptoms to develop.   Clifford Isaac MD 07/07/2015 3:09 PM

## 2015-07-07 NOTE — Progress Notes (Signed)
ANTICOAGULATION CONSULT NOTE - Follow-up Consult  Pharmacy Consult for Heparin Indication: chest pain/ACS  Allergies  Allergen Reactions  . Cinnamon Other (See Comments)    Stomach ache     Patient Measurements: Height: 5\' 4"  (162.6 cm) Weight: 168 lb 14 oz (76.6 kg) IBW/kg (Calculated) : 59.2 Heparin Dosing Weight: 76 kg  Vital Signs: Temp: 98.7 F (37.1 C) (05/11 0409) Temp Source: Oral (05/11 0409) BP: 112/60 mmHg (05/11 0409) Pulse Rate: 74 (05/11 0409)  Labs:  Recent Labs  07/05/15 0517  07/05/15 2201 07/06/15 0521 07/07/15 0331  HGB 13.4  --   --  14.0 14.4  HCT 39.2  --   --  40.1 41.5  PLT 204  --   --  216 237  HEPARINUNFRC 0.27*  < > 0.50 0.47 0.55  CREATININE  --   --   --   --  1.22  < > = values in this interval not displayed.  Estimated Creatinine Clearance: 51.2 mL/min (by C-G formula based on Cr of 1.22).  Assessment: 70 yo M presents on 5/6 with chest pain on heparin gtt. S/p cath 5/8 and found to need 3 possible grafts. Potential CABG Friday. Heparin level remains therapeutic at 0.55. CBC stable.   Goal of Therapy:  Heparin level 0.3-0.7 units/ml Monitor platelets by anticoagulation protocol: Yes   Plan:  Continue heparin gtt at 1,100 units/hr Daily HL, CBC,  Monitor for s/s of bleed  Angela Burke, PharmD Pharmacy Resident Pager: [phone]

## 2015-07-07 NOTE — Anesthesia Preprocedure Evaluation (Addendum)
Anesthesia Evaluation  Patient identified by MRN, date of birth, ID band Patient awake    Reviewed: Allergy & Precautions, NPO status , Patient's Chart, lab work & pertinent test results, reviewed documented beta blocker date and time   Airway Mallampati: II  TM Distance: >3 FB Neck ROM: Full    Dental  (+) Teeth Intact, Dental Advisory Given   Pulmonary neg pulmonary ROS, former smoker,    Pulmonary exam normal breath sounds clear to auscultation       Cardiovascular hypertension, Pt. on medications and Pt. on home beta blockers + angina + CAD, + Past MI and + Cardiac Stents  Normal cardiovascular exam Rhythm:Regular Rate:Normal  Left Heart Cath and Coronary Angiography  Conclusion  Ost Cx lesion, 50% stenosed. Mid LAD lesion, 95% stenosed. Severe, diffuse disease in the mid to distal LAD which is all heavily calcified. The origin of a moderate-sized diagonal is also compromised. Prox LAD to Mid LAD lesion, 50% stenosed. RPDA lesion, 80% stenosed. The left ventricular systolic function is normal. Normal LVEDP.  Will obtain cardiac surgery consultation for possible bypass surgery. He could receive 3 grafts, to the LAD, diagonal and PDA  TTE 07/06/15: Study Conclusions  - Left ventricle: The cavity size was normal. Systolic function was normal. The estimated ejection fraction was in the range of 55% to 60%. Wall motion was normal; there were no regional wall motion abnormalities. Doppler parameters are consistent with abnormal left ventricular relaxation (grade 1 diastolic dysfunction). Doppler parameters are consistent with indeterminate ventricular filling pressure. - Aortic valve: Transvalvular velocity was within the normal range. There was no stenosis. There was no regurgitation. - Mitral valve: Transvalvular velocity was within the normal range. There was no evidence for stenosis. There was trivial  regurgitation. - Right ventricle: The cavity size was normal. Wall thickness was normal. Systolic function was normal. - Tricuspid valve: There was trivial regurgitation.     Neuro/Psych negative neurological ROS  negative psych ROS   GI/Hepatic negative GI ROS, Neg liver ROS,   Endo/Other  diabetes, Well Controlled, Type 2  Renal/GU negative Renal ROS   Prostate ca s/p resection. Difficult foley placement.    Musculoskeletal negative musculoskeletal ROS (+)   Abdominal   Peds  Hematology negative hematology ROS (+)   Anesthesia Other Findings Day of surgery medications reviewed with the patient.  Reproductive/Obstetrics negative OB ROS                          Anesthesia Physical Anesthesia Plan  ASA: IV  Anesthesia Plan: General   Post-op Pain Management:    Induction: Intravenous  Airway Management Planned: Oral ETT  Additional Equipment: Arterial line, PA Cath, TEE, Ultrasound Guidance Line Placement and CVP  Intra-op Plan:   Post-operative Plan: Post-operative intubation/ventilation  Informed Consent: I have reviewed the patients History and Physical, chart, labs and discussed the procedure including the risks, benefits and alternatives for the proposed anesthesia with the patient or authorized representative who has indicated his/her understanding and acceptance.   Dental advisory given and Dental Advisory Given  Plan Discussed with: CRNA and Anesthesiologist  Anesthesia Plan Comments: (Risks/benefits of general anesthesia discussed with patient including risk of damage to teeth, lips, gum, and tongue, nausea/vomiting, allergic reactions to medications, and the possibility of heart attack, stroke and death.  All patient questions answered.  Patient wishes to proceed.)       Anesthesia Quick Evaluation

## 2015-07-07 NOTE — Progress Notes (Signed)
Called to bedside for increasing swelling at R femoral cath site, area soft, I did not appreciate a bruit on exam, however given enlarging size, will order arterial U/S to r/o pseudoaneurysm. He denies any back pain to suggest any RP bleed, but he has significant bruising around the area.  Hilbert Corrigan PA Pager: [phone]

## 2015-07-07 NOTE — Progress Notes (Signed)
[id number] Reviewed pre op ed. Stressed importance of mobility and IS after surgery. Discussed sternal precautions. Wife will be available after discharge first week he is home. They have care guide, OHS booklet and how to view pre op video. We will follow up after surgery. Pt has walked with RN already this morning with no CP. Graylon Good RN BSN 07/07/2015 11:22 AM

## 2015-07-08 ENCOUNTER — Inpatient Hospital Stay (HOSPITAL_COMMUNITY): Payer: 59 | Admitting: Anesthesiology

## 2015-07-08 ENCOUNTER — Encounter (HOSPITAL_COMMUNITY)
Admission: EM | Disposition: A | Discharge status: Home / Self Care | Payer: Self-pay | Source: Home / Self Care | Attending: Cardiovascular Disease

## 2015-07-08 ENCOUNTER — Inpatient Hospital Stay (HOSPITAL_COMMUNITY): Payer: 59

## 2015-07-08 ENCOUNTER — Encounter (HOSPITAL_COMMUNITY): Payer: Self-pay | Admitting: Anesthesiology

## 2015-07-08 DIAGNOSIS — I25110 Atherosclerotic heart disease of native coronary artery with unstable angina pectoris: Secondary | ICD-10-CM

## 2015-07-08 DIAGNOSIS — Z951 Presence of aortocoronary bypass graft: Secondary | ICD-10-CM

## 2015-07-08 HISTORY — PX: CORONARY ARTERY BYPASS GRAFT: SHX141

## 2015-07-08 HISTORY — PX: CYSTOSCOPY: SHX5120

## 2015-07-08 HISTORY — PX: TEE WITHOUT CARDIOVERSION: SHX5443

## 2015-07-08 LAB — GLUCOSE, CAPILLARY
Glucose-Capillary: 81 mg/dL (ref 65–99)
Glucose-Capillary: 111 mg/dL — ABNORMAL HIGH (ref 65–99)
Glucose-Capillary: 104 mg/dL — ABNORMAL HIGH (ref 65–99)
Glucose-Capillary: 174 mg/dL — ABNORMAL HIGH (ref 65–99)
Glucose-Capillary: 152 mg/dL — ABNORMAL HIGH (ref 65–99)

## 2015-07-08 LAB — POCT I-STAT 3, ART BLOOD GAS (G3+)
ACID-BASE DEFICIT: 4 mmol/L — AB (ref 0.0–2.0)
Acid-Base Excess: 1 mmol/L (ref 0.0–2.0)
Acid-base deficit: 3 mmol/L — ABNORMAL HIGH (ref 0.0–2.0)
Bicarbonate: 25.7 mEq/L — ABNORMAL HIGH (ref 20.0–24.0)
Bicarbonate: 22.7 mEq/L (ref 20.0–24.0)
Bicarbonate: 20.8 mEq/L (ref 20.0–24.0)
O2 SAT: 100 %
O2 SAT: 93 %
O2 Saturation: 93 %
PCO2 ART: 38.6 mmHg (ref 35.0–45.0)
PH ART: 7.304 — AB (ref 7.350–7.450)
PO2 ART: 73 mmHg — AB (ref 80.0–100.0)
Patient temperature: 38
TCO2: 27 mmol/L (ref 0–100)
TCO2: 24 mmol/L (ref 0–100)
TCO2: 22 mmol/L (ref 0–100)
pCO2 arterial: 38.6 mmHg (ref 35.0–45.0)
pCO2 arterial: 46 mmHg — ABNORMAL HIGH (ref 35.0–45.0)
pH, Arterial: 7.432 (ref 7.350–7.450)
pH, Arterial: 7.343 — ABNORMAL LOW (ref 7.350–7.450)
pO2, Arterial: 317 mmHg — ABNORMAL HIGH (ref 80.0–100.0)
pO2, Arterial: 76 mmHg — ABNORMAL LOW (ref 80.0–100.0)

## 2015-07-08 LAB — CBC
HCT: 27.6 % — ABNORMAL LOW (ref 39.0–52.0)
HEMATOCRIT: 39.6 % (ref 39.0–52.0)
HEMATOCRIT: 27.5 % — AB (ref 39.0–52.0)
Hemoglobin: 13.5 g/dL (ref 13.0–17.0)
Hemoglobin: 9.7 g/dL — ABNORMAL LOW (ref 13.0–17.0)
Hemoglobin: 9.8 g/dL — ABNORMAL LOW (ref 13.0–17.0)
MCH: 31.1 pg (ref 26.0–34.0)
MCH: 32.7 pg (ref 26.0–34.0)
MCH: 32.9 pg (ref 26.0–34.0)
MCHC: 34.1 g/dL (ref 30.0–36.0)
MCHC: 35.3 g/dL (ref 30.0–36.0)
MCHC: 35.5 g/dL (ref 30.0–36.0)
MCV: 91.2 fL (ref 78.0–100.0)
MCV: 92.6 fL (ref 78.0–100.0)
MCV: 92.6 fL (ref 78.0–100.0)
PLATELETS: 222 10*3/uL (ref 150–400)
PLATELETS: 105 10*3/uL — AB (ref 150–400)
Platelets: 127 10*3/uL — ABNORMAL LOW (ref 150–400)
RBC: 4.34 MIL/uL (ref 4.22–5.81)
RBC: 2.97 MIL/uL — ABNORMAL LOW (ref 4.22–5.81)
RBC: 2.98 MIL/uL — ABNORMAL LOW (ref 4.22–5.81)
RDW: 13 % (ref 11.5–15.5)
RDW: 12.9 % (ref 11.5–15.5)
RDW: 13.1 % (ref 11.5–15.5)
WBC: 8.9 10*3/uL (ref 4.0–10.5)
WBC: 12.6 10*3/uL — AB (ref 4.0–10.5)
WBC: 12.6 10*3/uL — ABNORMAL HIGH (ref 4.0–10.5)

## 2015-07-08 LAB — HEMOGLOBIN A1C
HEMOGLOBIN A1C: 7.9 % — AB (ref 4.8–5.6)
MEAN PLASMA GLUCOSE: 180 mg/dL

## 2015-07-08 LAB — POCT I-STAT, CHEM 8
BUN: 17 mg/dL (ref 6–20)
BUN: 14 mg/dL (ref 6–20)
BUN: 15 mg/dL (ref 6–20)
BUN: 14 mg/dL (ref 6–20)
BUN: 13 mg/dL (ref 6–20)
BUN: 14 mg/dL (ref 6–20)
CALCIUM ION: 1.04 mmol/L — AB (ref 1.13–1.30)
CALCIUM ION: 1.05 mmol/L — AB (ref 1.13–1.30)
CALCIUM ION: 1.04 mmol/L — AB (ref 1.13–1.30)
CHLORIDE: 104 mmol/L (ref 101–111)
CHLORIDE: 99 mmol/L — AB (ref 101–111)
CREATININE: 1 mg/dL (ref 0.61–1.24)
CREATININE: 0.8 mg/dL (ref 0.61–1.24)
Calcium, Ion: 1.21 mmol/L (ref 1.13–1.30)
Calcium, Ion: 1.21 mmol/L (ref 1.13–1.30)
Calcium, Ion: 1.14 mmol/L (ref 1.13–1.30)
Chloride: 102 mmol/L (ref 101–111)
Chloride: 99 mmol/L — ABNORMAL LOW (ref 101–111)
Chloride: 99 mmol/L — ABNORMAL LOW (ref 101–111)
Chloride: 106 mmol/L (ref 101–111)
Creatinine, Ser: 0.8 mg/dL (ref 0.61–1.24)
Creatinine, Ser: 0.9 mg/dL (ref 0.61–1.24)
Creatinine, Ser: 0.9 mg/dL (ref 0.61–1.24)
Creatinine, Ser: 1.1 mg/dL (ref 0.61–1.24)
GLUCOSE: 119 mg/dL — AB (ref 65–99)
GLUCOSE: 133 mg/dL — AB (ref 65–99)
GLUCOSE: 136 mg/dL — AB (ref 65–99)
GLUCOSE: 168 mg/dL — AB (ref 65–99)
Glucose, Bld: 167 mg/dL — ABNORMAL HIGH (ref 65–99)
Glucose, Bld: 101 mg/dL — ABNORMAL HIGH (ref 65–99)
HCT: 26 % — ABNORMAL LOW (ref 39.0–52.0)
HCT: 32 % — ABNORMAL LOW (ref 39.0–52.0)
HCT: 26 % — ABNORMAL LOW (ref 39.0–52.0)
HCT: 23 % — ABNORMAL LOW (ref 39.0–52.0)
HEMATOCRIT: 35 % — AB (ref 39.0–52.0)
HEMATOCRIT: 28 % — AB (ref 39.0–52.0)
HEMOGLOBIN: 10.9 g/dL — AB (ref 13.0–17.0)
HEMOGLOBIN: 8.8 g/dL — AB (ref 13.0–17.0)
HEMOGLOBIN: 9.5 g/dL — AB (ref 13.0–17.0)
Hemoglobin: 11.9 g/dL — ABNORMAL LOW (ref 13.0–17.0)
Hemoglobin: 8.8 g/dL — ABNORMAL LOW (ref 13.0–17.0)
Hemoglobin: 7.8 g/dL — ABNORMAL LOW (ref 13.0–17.0)
POTASSIUM: 4 mmol/L (ref 3.5–5.1)
POTASSIUM: 3.6 mmol/L (ref 3.5–5.1)
Potassium: 3.8 mmol/L (ref 3.5–5.1)
Potassium: 3.9 mmol/L (ref 3.5–5.1)
Potassium: 4.2 mmol/L (ref 3.5–5.1)
Potassium: 4.8 mmol/L (ref 3.5–5.1)
SODIUM: 140 mmol/L (ref 135–145)
Sodium: 138 mmol/L (ref 135–145)
Sodium: 138 mmol/L (ref 135–145)
Sodium: 139 mmol/L (ref 135–145)
Sodium: 136 mmol/L (ref 135–145)
Sodium: 136 mmol/L (ref 135–145)
TCO2: 24 mmol/L (ref 0–100)
TCO2: 25 mmol/L (ref 0–100)
TCO2: 27 mmol/L (ref 0–100)
TCO2: 23 mmol/L (ref 0–100)
TCO2: 23 mmol/L (ref 0–100)
TCO2: 21 mmol/L (ref 0–100)

## 2015-07-08 LAB — MAGNESIUM: Magnesium: 3.4 mg/dL — ABNORMAL HIGH (ref 1.7–2.4)

## 2015-07-08 LAB — PROTIME-INR
INR: 1.69 — AB (ref 0.00–1.49)
PROTHROMBIN TIME: 19.9 s — AB (ref 11.6–15.2)

## 2015-07-08 LAB — CREATININE, SERUM
Creatinine, Ser: 1.17 mg/dL (ref 0.61–1.24)
GFR calc Af Amer: >60 mL/min (ref >60)
GFR calc non Af Amer: >60 mL/min (ref >60)

## 2015-07-08 LAB — HEMOGLOBIN AND HEMATOCRIT, BLOOD
HCT: 24.3 % — ABNORMAL LOW (ref 39.0–52.0)
Hemoglobin: 8.2 g/dL — ABNORMAL LOW (ref 13.0–17.0)

## 2015-07-08 LAB — PLATELET COUNT: Platelets: 147 10*3/uL — ABNORMAL LOW (ref 150–400)

## 2015-07-08 LAB — APTT: APTT: 29 s (ref 24–37)

## 2015-07-08 LAB — HEPARIN LEVEL (UNFRACTIONATED): Heparin Unfractionated: 0.58 IU/mL (ref 0.30–0.70)

## 2015-07-08 SURGERY — CORONARY ARTERY BYPASS GRAFTING (CABG)
Anesthesia: General | Site: Chest

## 2015-07-08 MED ORDER — LACTATED RINGERS IV SOLN: INTRAVENOUS | Status: DC

## 2015-07-08 MED ORDER — LACTATED RINGERS IV SOLN
INTRAVENOUS | Status: DC | PRN
  Administered 2015-07-08 (×2): via INTRAVENOUS

## 2015-07-08 MED ORDER — ACETAMINOPHEN 160 MG/5ML PO SOLN: 1000.0000 mg | Freq: Four times a day (QID) | ORAL | Status: DC

## 2015-07-08 MED ORDER — FENTANYL CITRATE (PF) 100 MCG/2ML IJ SOLN
INTRAMUSCULAR | Status: DC | PRN
  Administered 2015-07-08: 50 ug via INTRAVENOUS
  Administered 2015-07-08 (×2): 250 ug via INTRAVENOUS
  Administered 2015-07-08: 100 ug via INTRAVENOUS
  Administered 2015-07-08 (×3): 150 ug via INTRAVENOUS
  Administered 2015-07-08 (×4): 100 ug via INTRAVENOUS

## 2015-07-08 MED ORDER — PANTOPRAZOLE SODIUM 40 MG PO TBEC: 40.0000 mg | DELAYED_RELEASE_TABLET | Freq: Every day | ORAL | Status: DC

## 2015-07-08 MED ORDER — PROPOFOL 10 MG/ML IV BOLUS
INTRAVENOUS | Status: AC
  Filled 2015-07-08: qty 20

## 2015-07-08 MED ORDER — SODIUM CHLORIDE 0.9 % IV SOLN
INTRAVENOUS | Status: DC
  Administered 2015-07-08: 14:00:00 via INTRAVENOUS

## 2015-07-08 MED ORDER — MIDAZOLAM HCL 2 MG/2ML IJ SOLN: 2.0000 mg | INTRAMUSCULAR | Status: DC | PRN

## 2015-07-08 MED ORDER — ACETAMINOPHEN 650 MG RE SUPP
650.0000 mg | Freq: Once | RECTAL | Status: AC
  Administered 2015-07-08: 650 mg via RECTAL

## 2015-07-08 MED ORDER — FAMOTIDINE IN NACL 20-0.9 MG/50ML-% IV SOLN
20.0000 mg | Freq: Two times a day (BID) | INTRAVENOUS | Status: DC
  Administered 2015-07-08: 20 mg via INTRAVENOUS

## 2015-07-08 MED ORDER — 0.9 % SODIUM CHLORIDE (POUR BTL) OPTIME
TOPICAL | Status: DC | PRN
  Administered 2015-07-08: 4000 mL

## 2015-07-08 MED ORDER — METOCLOPRAMIDE HCL 5 MG/ML IJ SOLN
10.0000 mg | Freq: Four times a day (QID) | INTRAMUSCULAR | Status: DC
  Administered 2015-07-08 – 2015-07-09 (×3): 10 mg via INTRAVENOUS
  Filled 2015-07-08 (×2): qty 2

## 2015-07-08 MED ORDER — OXYCODONE HCL 5 MG PO TABS
5.0000 mg | ORAL_TABLET | ORAL | Status: DC | PRN
Start: 2015-07-08 — End: 2015-07-10
  Administered 2015-07-09 (×2): 10 mg via ORAL
  Administered 2015-07-10: 5 mg via ORAL
  Filled 2015-07-08 (×2): qty 2
  Filled 2015-07-08: qty 1

## 2015-07-08 MED ORDER — ROCURONIUM BROMIDE 100 MG/10ML IV SOLN
INTRAVENOUS | Status: DC | PRN
  Administered 2015-07-08: 80 mg via INTRAVENOUS

## 2015-07-08 MED ORDER — TEMAZEPAM 15 MG PO CAPS: 15.0000 mg | ORAL_CAPSULE | Freq: Once | ORAL | Status: DC | PRN

## 2015-07-08 MED ORDER — ROCURONIUM BROMIDE 100 MG/10ML IV SOLN: INTRAVENOUS | Status: DC | PRN

## 2015-07-08 MED ORDER — HEPARIN SODIUM (PORCINE) 1000 UNIT/ML IJ SOLN
INTRAMUSCULAR | Status: DC | PRN
  Administered 2015-07-08: 31000 [IU] via INTRAVENOUS

## 2015-07-08 MED ORDER — PROTAMINE SULFATE 10 MG/ML IV SOLN
INTRAVENOUS | Status: AC
  Filled 2015-07-08: qty 25

## 2015-07-08 MED ORDER — MORPHINE SULFATE (PF) 2 MG/ML IV SOLN
2.0000 mg | INTRAVENOUS | Status: DC | PRN
  Administered 2015-07-08 – 2015-07-09 (×11): 2 mg via INTRAVENOUS
  Administered 2015-07-10: 4 mg via INTRAVENOUS
  Administered 2015-07-10 (×2): 2 mg via INTRAVENOUS
  Filled 2015-07-08: qty 1
  Filled 2015-07-08: qty 2
  Filled 2015-07-08 (×13): qty 1

## 2015-07-08 MED ORDER — CHLORHEXIDINE GLUCONATE 0.12 % MT SOLN
15.0000 mL | Freq: Once | OROMUCOSAL | Status: DC
  Filled 2015-07-08: qty 15

## 2015-07-08 MED ORDER — BISACODYL 10 MG RE SUPP: 10.0000 mg | Freq: Every day | RECTAL | Status: DC

## 2015-07-08 MED ORDER — LACTATED RINGERS IV SOLN
INTRAVENOUS | Status: DC | PRN
  Administered 2015-07-08: 07:00:00 via INTRAVENOUS

## 2015-07-08 MED ORDER — INSULIN REGULAR HUMAN 100 UNIT/ML IJ SOLN
INTRAMUSCULAR | Status: DC
  Administered 2015-07-09: 2 [IU]/h via INTRAVENOUS
  Filled 2015-07-08: qty 2.5

## 2015-07-08 MED ORDER — POTASSIUM CHLORIDE 10 MEQ/50ML IV SOLN
10.0000 meq | INTRAVENOUS | Status: AC
  Administered 2015-07-08 (×3): 10 meq via INTRAVENOUS

## 2015-07-08 MED ORDER — HEMOSTATIC AGENTS (NO CHARGE) OPTIME
TOPICAL | Status: DC | PRN
  Administered 2015-07-08: 3 via TOPICAL

## 2015-07-08 MED ORDER — MIDAZOLAM HCL 5 MG/5ML IJ SOLN
INTRAMUSCULAR | Status: DC | PRN
  Administered 2015-07-08: 3 mg via INTRAVENOUS
  Administered 2015-07-08: 1 mg via INTRAVENOUS
  Administered 2015-07-08: 2 mg via INTRAVENOUS
  Administered 2015-07-08 (×2): 1 mg via INTRAVENOUS
  Administered 2015-07-08: 2 mg via INTRAVENOUS

## 2015-07-08 MED ORDER — FENTANYL CITRATE (PF) 250 MCG/5ML IJ SOLN
INTRAMUSCULAR | Status: AC
  Filled 2015-07-08: qty 5

## 2015-07-08 MED ORDER — ROCURONIUM BROMIDE 10 MG/ML (PF) SYRINGE: PREFILLED_SYRINGE | INTRAVENOUS | Status: DC | PRN

## 2015-07-08 MED ORDER — SODIUM CHLORIDE 0.9 % IV SOLN: 250.0000 mL | INTRAVENOUS | Status: DC

## 2015-07-08 MED ORDER — SODIUM CHLORIDE 0.9% FLUSH
3.0000 mL | Freq: Two times a day (BID) | INTRAVENOUS | Status: DC
  Administered 2015-07-09 (×2): 3 mL via INTRAVENOUS

## 2015-07-08 MED ORDER — CHLORHEXIDINE GLUCONATE CLOTH 2 % EX PADS: 6.0000 | MEDICATED_PAD | Freq: Once | CUTANEOUS | Status: AC

## 2015-07-08 MED ORDER — METOPROLOL TARTRATE 5 MG/5ML IV SOLN
2.5000 mg | INTRAVENOUS | Status: DC | PRN
  Administered 2015-07-10: 5 mg via INTRAVENOUS

## 2015-07-08 MED ORDER — ALBUMIN HUMAN 5 % IV SOLN
INTRAVENOUS | Status: DC | PRN
  Administered 2015-07-08 (×3): via INTRAVENOUS

## 2015-07-08 MED ORDER — FENTANYL CITRATE (PF) 250 MCG/5ML IJ SOLN
INTRAMUSCULAR | Status: AC
  Filled 2015-07-08: qty 20

## 2015-07-08 MED ORDER — ACETAMINOPHEN 160 MG/5ML PO SOLN: 650.0000 mg | Freq: Once | ORAL | Status: AC

## 2015-07-08 MED ORDER — INSULIN REGULAR BOLUS VIA INFUSION
0.0000 [IU] | Freq: Three times a day (TID) | INTRAVENOUS | Status: DC
  Administered 2015-07-09 (×2): 2 [IU] via INTRAVENOUS
  Filled 2015-07-08: qty 10

## 2015-07-08 MED ORDER — CHLORHEXIDINE GLUCONATE 0.12 % MT SOLN
15.0000 mL | OROMUCOSAL | Status: AC
  Administered 2015-07-08: 15 mL via OROMUCOSAL

## 2015-07-08 MED ORDER — VANCOMYCIN HCL IN DEXTROSE 1-5 GM/200ML-% IV SOLN
1000.0000 mg | Freq: Once | INTRAVENOUS | Status: AC
  Administered 2015-07-08: 1000 mg via INTRAVENOUS
  Filled 2015-07-08: qty 200

## 2015-07-08 MED ORDER — LACTATED RINGERS IV SOLN
INTRAVENOUS | Status: DC | PRN
  Administered 2015-07-08 (×2): via INTRAVENOUS

## 2015-07-08 MED ORDER — FENTANYL CITRATE (PF) 250 MCG/5ML IJ SOLN
INTRAMUSCULAR | Status: AC
Start: 2015-07-08 — End: 2015-07-08
  Filled 2015-07-08: qty 5

## 2015-07-08 MED ORDER — VECURONIUM BROMIDE 10 MG IV SOLR
INTRAVENOUS | Status: DC | PRN
  Administered 2015-07-08 (×3): 5 mg via INTRAVENOUS

## 2015-07-08 MED ORDER — LIDOCAINE 2% (20 MG/ML) 5 ML SYRINGE
INTRAMUSCULAR | Status: DC | PRN
  Administered 2015-07-08: 50 mg via INTRAVENOUS

## 2015-07-08 MED ORDER — PHENYLEPHRINE HCL 10 MG/ML IJ SOLN
0.0000 ug/min | INTRAVENOUS | Status: DC
  Filled 2015-07-08: qty 2

## 2015-07-08 MED ORDER — SODIUM CHLORIDE 0.9 % IJ SOLN
INTRAMUSCULAR | Status: AC
  Filled 2015-07-08: qty 10

## 2015-07-08 MED ORDER — ASPIRIN 81 MG PO CHEW: 324.0000 mg | CHEWABLE_TABLET | Freq: Every day | ORAL | Status: DC

## 2015-07-08 MED ORDER — SODIUM CHLORIDE 0.9 % IR SOLN
Status: DC | PRN
  Administered 2015-07-08: 200 mL via INTRAVASCULAR

## 2015-07-08 MED ORDER — METOPROLOL TARTRATE 25 MG/10 ML ORAL SUSPENSION: 12.5000 mg | Freq: Two times a day (BID) | ORAL | Status: DC

## 2015-07-08 MED ORDER — MORPHINE SULFATE (PF) 2 MG/ML IV SOLN
1.0000 mg | INTRAVENOUS | Status: DC | PRN
  Administered 2015-07-08 (×2): 2 mg via INTRAVENOUS

## 2015-07-08 MED ORDER — SODIUM CHLORIDE 0.9 % IV SOLN
10.0000 mg | INTRAVENOUS | Status: DC | PRN
  Administered 2015-07-08: 15 ug/min via INTRAVENOUS

## 2015-07-08 MED ORDER — PHENYLEPHRINE HCL 10 MG/ML IJ SOLN
INTRAMUSCULAR | Status: AC
  Filled 2015-07-08: qty 2

## 2015-07-08 MED ORDER — MAGNESIUM SULFATE 4 GM/100ML IV SOLN
4.0000 g | Freq: Once | INTRAVENOUS | Status: AC
  Administered 2015-07-08: 4 g via INTRAVENOUS
  Filled 2015-07-08: qty 100

## 2015-07-08 MED ORDER — PROPOFOL 10 MG/ML IV BOLUS
INTRAVENOUS | Status: DC | PRN
  Administered 2015-07-08: 80 mg via INTRAVENOUS

## 2015-07-08 MED ORDER — ACETAMINOPHEN 500 MG PO TABS
1000.0000 mg | ORAL_TABLET | Freq: Four times a day (QID) | ORAL | Status: DC
Start: 2015-07-09 — End: 2015-07-10
  Administered 2015-07-08 – 2015-07-10 (×5): 1000 mg via ORAL
  Filled 2015-07-08 (×5): qty 2

## 2015-07-08 MED ORDER — PROTAMINE SULFATE 10 MG/ML IV SOLN
INTRAVENOUS | Status: DC | PRN
  Administered 2015-07-08 (×6): 50 mg via INTRAVENOUS

## 2015-07-08 MED ORDER — LACTATED RINGERS IV SOLN
INTRAVENOUS | Status: DC
  Administered 2015-07-08: 14:00:00 via INTRAVENOUS

## 2015-07-08 MED ORDER — ARTIFICIAL TEARS OP OINT
TOPICAL_OINTMENT | OPHTHALMIC | Status: DC | PRN
  Administered 2015-07-08: 1 via OPHTHALMIC

## 2015-07-08 MED ORDER — LACTATED RINGERS IV SOLN: 500.0000 mL | Freq: Once | INTRAVENOUS | Status: DC | PRN

## 2015-07-08 MED ORDER — SODIUM CHLORIDE 0.45 % IV SOLN: INTRAVENOUS | Status: DC | PRN

## 2015-07-08 MED ORDER — DOCUSATE SODIUM 100 MG PO CAPS
200.0000 mg | ORAL_CAPSULE | Freq: Every day | ORAL | Status: DC
Start: 2015-07-09 — End: 2015-07-10
  Administered 2015-07-09: 200 mg via ORAL
  Filled 2015-07-08: qty 2

## 2015-07-08 MED ORDER — CHLORHEXIDINE GLUCONATE CLOTH 2 % EX PADS
6.0000 | MEDICATED_PAD | Freq: Once | CUTANEOUS | Status: AC
  Administered 2015-07-08: 6 via TOPICAL

## 2015-07-08 MED ORDER — BISACODYL 5 MG PO TBEC
10.0000 mg | DELAYED_RELEASE_TABLET | Freq: Every day | ORAL | Status: DC
  Administered 2015-07-09: 10 mg via ORAL
  Filled 2015-07-08: qty 2

## 2015-07-08 MED ORDER — BISACODYL 5 MG PO TBEC: 5.0000 mg | DELAYED_RELEASE_TABLET | Freq: Once | ORAL | Status: DC

## 2015-07-08 MED ORDER — MIDAZOLAM HCL 10 MG/2ML IJ SOLN
INTRAMUSCULAR | Status: AC
  Filled 2015-07-08: qty 2

## 2015-07-08 MED ORDER — NITROGLYCERIN IN D5W 200-5 MCG/ML-% IV SOLN
0.0000 ug/min | INTRAVENOUS | Status: DC
  Administered 2015-07-09: 10 ug/min via INTRAVENOUS

## 2015-07-08 MED ORDER — HEMOSTATIC AGENTS (NO CHARGE) OPTIME
TOPICAL | Status: DC | PRN
  Administered 2015-07-08: 1 via TOPICAL

## 2015-07-08 MED ORDER — METOPROLOL TARTRATE 12.5 MG HALF TABLET
12.5000 mg | ORAL_TABLET | Freq: Two times a day (BID) | ORAL | Status: DC
  Administered 2015-07-09: 12.5 mg via ORAL
  Filled 2015-07-08: qty 1

## 2015-07-08 MED ORDER — DEXTROSE 5 % IV SOLN
1.5000 g | Freq: Two times a day (BID) | INTRAVENOUS | Status: AC
  Administered 2015-07-08 – 2015-07-10 (×4): 1.5 g via INTRAVENOUS
  Filled 2015-07-08 (×4): qty 1.5

## 2015-07-08 MED ORDER — ONDANSETRON HCL 4 MG/2ML IJ SOLN: 4.0000 mg | Freq: Four times a day (QID) | INTRAMUSCULAR | Status: DC | PRN

## 2015-07-08 MED ORDER — TRAMADOL HCL 50 MG PO TABS: 50.0000 mg | ORAL_TABLET | ORAL | Status: DC | PRN

## 2015-07-08 MED ORDER — POTASSIUM CHLORIDE 10 MEQ/50ML IV SOLN
10.0000 meq | Freq: Once | INTRAVENOUS | Status: AC
  Administered 2015-07-08: 10 meq via INTRAVENOUS

## 2015-07-08 MED ORDER — METOPROLOL TARTRATE 12.5 MG HALF TABLET
12.5000 mg | ORAL_TABLET | Freq: Once | ORAL | Status: DC
  Filled 2015-07-08: qty 1

## 2015-07-08 MED ORDER — ALBUMIN HUMAN 5 % IV SOLN
250.0000 mL | INTRAVENOUS | Status: AC | PRN
  Administered 2015-07-08 (×4): 250 mL via INTRAVENOUS
  Filled 2015-07-08 (×2): qty 250

## 2015-07-08 MED ORDER — DEXMEDETOMIDINE HCL IN NACL 200 MCG/50ML IV SOLN
0.0000 ug/kg/h | INTRAVENOUS | Status: DC
  Filled 2015-07-08: qty 50

## 2015-07-08 MED ORDER — SODIUM CHLORIDE 0.9% FLUSH: 3.0000 mL | INTRAVENOUS | Status: DC | PRN

## 2015-07-08 MED ORDER — ASPIRIN EC 325 MG PO TBEC
325.0000 mg | DELAYED_RELEASE_TABLET | Freq: Every day | ORAL | Status: DC
  Administered 2015-07-09: 325 mg via ORAL
  Filled 2015-07-08: qty 1

## 2015-07-08 MED ORDER — PHENYLEPHRINE HCL 10 MG/ML IJ SOLN
INTRAMUSCULAR | Status: DC | PRN
  Administered 2015-07-08 (×2): 40 ug via INTRAVENOUS

## 2015-07-08 MED ORDER — ROCURONIUM BROMIDE 50 MG/5ML IV SOLN
INTRAVENOUS | Status: AC
  Filled 2015-07-08: qty 2

## 2015-07-08 MED FILL — Potassium Chloride Inj 2 mEq/ML: INTRAVENOUS | Qty: 40 | Status: AC

## 2015-07-08 MED FILL — Magnesium Sulfate Inj 50%: INTRAMUSCULAR | Qty: 10 | Status: AC

## 2015-07-08 MED FILL — Heparin Sodium (Porcine) Inj 1000 Unit/ML: INTRAMUSCULAR | Qty: 30 | Status: AC

## 2015-07-08 SURGICAL SUPPLY — 95 items
ADH SKN CLS APL DERMABOND .7 (GAUZE/BANDAGES/DRESSINGS) ×2
BAG DECANTER FOR FLEXI CONT (MISCELLANEOUS) ×4 IMPLANT
BAG URINE LEG 500ML (DRAIN) ×2 IMPLANT
BALLN NEPHROSTOMY (BALLOONS) ×4
BALLOON NEPHROSTOMY (BALLOONS) IMPLANT
BANDAGE ELASTIC 4 VELCRO ST LF (GAUZE/BANDAGES/DRESSINGS) ×4 IMPLANT
BANDAGE ELASTIC 6 VELCRO ST LF (GAUZE/BANDAGES/DRESSINGS) ×4 IMPLANT
BLADE STERNUM SYSTEM 6 (BLADE) ×4 IMPLANT
BNDG GAUZE ELAST 4 BULKY (GAUZE/BANDAGES/DRESSINGS) ×4 IMPLANT
CANISTER SUCTION 2500CC (MISCELLANEOUS) ×4 IMPLANT
CATH COUDE FOLEY 5CC 14FR (CATHETERS) ×2 IMPLANT
CATH CPB KIT GERHARDT (MISCELLANEOUS) ×4 IMPLANT
CATH FOLEY 2W COUNCIL 5CC 16FR (CATHETERS) ×2 IMPLANT
CATH FOLEY 2WAY  3CC 10FR (CATHETERS) ×2
CATH FOLEY 2WAY 3CC 10FR (CATHETERS) IMPLANT
CATH FOLEY 2WAY SLVR  5CC 12FR (CATHETERS) ×2
CATH FOLEY 2WAY SLVR 5CC 12FR (CATHETERS) IMPLANT
CATH THORACIC 28FR (CATHETERS) ×4 IMPLANT
CLIP RETRACTION 3.0MM CORONARY (MISCELLANEOUS) ×2 IMPLANT
CRADLE DONUT ADULT HEAD (MISCELLANEOUS) ×4 IMPLANT
DERMABOND ADVANCED (GAUZE/BANDAGES/DRESSINGS) ×2
DERMABOND ADVANCED .7 DNX12 (GAUZE/BANDAGES/DRESSINGS) IMPLANT
DRAIN CHANNEL 28F RND 3/8 FF (WOUND CARE) ×4 IMPLANT
DRAPE CARDIOVASCULAR INCISE (DRAPES) ×4
DRAPE SLUSH/WARMER DISC (DRAPES) ×4 IMPLANT
DRAPE SRG 135X102X78XABS (DRAPES) ×2 IMPLANT
DRSG AQUACEL AG ADV 3.5X14 (GAUZE/BANDAGES/DRESSINGS) ×4 IMPLANT
ELECT BLADE 4.0 EZ CLEAN MEGAD (MISCELLANEOUS) ×4
ELECT REM PT RETURN 9FT ADLT (ELECTROSURGICAL) ×8
ELECTRODE BLDE 4.0 EZ CLN MEGD (MISCELLANEOUS) ×2 IMPLANT
ELECTRODE REM PT RTRN 9FT ADLT (ELECTROSURGICAL) ×4 IMPLANT
FELT TEFLON 1X6 (MISCELLANEOUS) ×4 IMPLANT
GAUZE SPONGE 4X4 12PLY STRL (GAUZE/BANDAGES/DRESSINGS) ×8 IMPLANT
GLOVE BIO SURGEON STRL SZ 6.5 (GLOVE) ×9 IMPLANT
GLOVE BIO SURGEON STRL SZ7.5 (GLOVE) ×2 IMPLANT
GLOVE BIO SURGEONS STRL SZ 6.5 (GLOVE) ×3
GOWN STRL REUS W/ TWL LRG LVL3 (GOWN DISPOSABLE) ×8 IMPLANT
GOWN STRL REUS W/TWL LRG LVL3 (GOWN DISPOSABLE) ×16
GUIDEWIRE STR DUAL SENSOR (WIRE) ×2 IMPLANT
HEMOSTAT POWDER SURGIFOAM 1G (HEMOSTASIS) ×12 IMPLANT
HEMOSTAT SURGICEL 2X14 (HEMOSTASIS) ×4 IMPLANT
KIT BASIN OR (CUSTOM PROCEDURE TRAY) ×4 IMPLANT
KIT CATH SUCT 8FR (CATHETERS) ×4 IMPLANT
KIT ROOM TURNOVER OR (KITS) ×4 IMPLANT
KIT SUCTION CATH 14FR (SUCTIONS) ×8 IMPLANT
KIT VASOVIEW 6 PRO VH 2400 (KITS) ×4 IMPLANT
LEAD PACING MYOCARDI (MISCELLANEOUS) ×4 IMPLANT
MARKER GRAFT CORONARY BYPASS (MISCELLANEOUS) ×12 IMPLANT
NS IRRIG 1000ML POUR BTL (IV SOLUTION) ×20 IMPLANT
PACK OPEN HEART (CUSTOM PROCEDURE TRAY) ×4 IMPLANT
PAD ARMBOARD 7.5X6 YLW CONV (MISCELLANEOUS) ×8 IMPLANT
PAD ELECT DEFIB RADIOL ZOLL (MISCELLANEOUS) ×4 IMPLANT
PENCIL BUTTON HOLSTER BLD 10FT (ELECTRODE) ×4 IMPLANT
PUNCH AORTIC ROTATE  4.5MM 8IN (MISCELLANEOUS) ×2 IMPLANT
SET CARDIOPLEGIA MPS 5001102 (MISCELLANEOUS) ×2 IMPLANT
SET CYSTO W/LG BORE CLAMP LF (SET/KITS/TRAYS/PACK) ×6 IMPLANT
SPONGE GAUZE 4X4 12PLY STER LF (GAUZE/BANDAGES/DRESSINGS) ×4 IMPLANT
SUT BONE WAX W31G (SUTURE) ×4 IMPLANT
SUT ETHIBOND 2 0 SH (SUTURE) ×16
SUT ETHIBOND 2 0 SH 36X2 (SUTURE) IMPLANT
SUT PROLENE 3 0 SH1 36 (SUTURE) ×4 IMPLANT
SUT PROLENE 4 0 RB 1 (SUTURE) ×4
SUT PROLENE 4 0 TF (SUTURE) ×8 IMPLANT
SUT PROLENE 4-0 RB1 .5 CRCL 36 (SUTURE) IMPLANT
SUT PROLENE 5 0 C 1 36 (SUTURE) ×4 IMPLANT
SUT PROLENE 6 0 C 1 30 (SUTURE) ×4 IMPLANT
SUT PROLENE 6 0 CC (SUTURE) ×10 IMPLANT
SUT PROLENE 7 0 BV1 MDA (SUTURE) ×4 IMPLANT
SUT PROLENE 8 0 BV175 6 (SUTURE) ×4 IMPLANT
SUT SILK  1 MH (SUTURE) ×6
SUT SILK 1 MH (SUTURE) IMPLANT
SUT SILK 1 TIES 10X30 (SUTURE) ×4 IMPLANT
SUT SILK 2 0 SH CR/8 (SUTURE) ×4 IMPLANT
SUT SILK 2 0 TIES 17X18 (SUTURE) ×4
SUT SILK 2-0 18XBRD TIE BLK (SUTURE) IMPLANT
SUT SILK 3 0 SH CR/8 (SUTURE) ×2 IMPLANT
SUT SILK 4 0 TIE 10X30 (SUTURE) ×6 IMPLANT
SUT STEEL 6MS V (SUTURE) ×4 IMPLANT
SUT STEEL SZ 6 DBL 3X14 BALL (SUTURE) ×4 IMPLANT
SUT TEM PAC WIRE 2 0 SH (SUTURE) ×8 IMPLANT
SUT VIC AB 1 CTX 18 (SUTURE) ×8 IMPLANT
SUT VIC AB 2-0 CTX 27 (SUTURE) ×4 IMPLANT
SUT VIC AB 3-0 X1 27 (SUTURE) ×4 IMPLANT
SUTURE E-PAK OPEN HEART (SUTURE) ×4 IMPLANT
SYR CONTROL 10ML LL (SYRINGE) ×2 IMPLANT
SYR TOOMEY 50ML (SYRINGE) ×2 IMPLANT
SYSTEM SAHARA CHEST DRAIN ATS (WOUND CARE) ×4 IMPLANT
TAPE CLOTH SURG 4X10 WHT LF (GAUZE/BANDAGES/DRESSINGS) ×2 IMPLANT
TOWEL OR 17X24 6PK STRL BLUE (TOWEL DISPOSABLE) ×8 IMPLANT
TOWEL OR 17X26 10 PK STRL BLUE (TOWEL DISPOSABLE) ×8 IMPLANT
TRAY CATH LUMEN 1 20CM STRL (SET/KITS/TRAYS/PACK) ×2 IMPLANT
TRAY FOLEY IC TEMP SENS 16FR (CATHETERS) ×4 IMPLANT
TUBING INSUFFLATION (TUBING) ×4 IMPLANT
UNDERPAD 30X30 INCONTINENT (UNDERPADS AND DIAPERS) ×4 IMPLANT
WATER STERILE IRR 1000ML POUR (IV SOLUTION) ×8 IMPLANT

## 2015-07-08 NOTE — Transfer of Care (Signed)
Immediate Anesthesia Transfer of Care Note  Patient: Clifford [name]  Procedure(s) Performed: Procedure(s): CORONARY ARTERY BYPASS GRAFTING (CABG)x three using left internal mammary artery to left anterior decending artery, left greater saphenous vein grafts to diagonal and posterolateral. Left leg greater saphenous leg vein via endoscope.  (N/A) TRANSESOPHAGEAL ECHOCARDIOGRAM (TEE) (N/A) CYSTOSCOPY FLEXIBLE WITH BALLOON DILATION OF BLADDER NECK CONTRACTURE WITH DIFFICULT CATHETER PLACEMENT (N/A)  Patient Location: PACU  Anesthesia Type:General  Level of Consciousness: Patient remains intubated per anesthesia plan  Airway & Oxygen Therapy: Patient remains intubated per anesthesia plan and Patient placed on Ventilator (see vital sign flow sheet for setting)  Post-op Assessment: Report given to RN  Post vital signs: Reviewed and stable  Last Vitals:  Filed Vitals:   07/08/15 0354 07/08/15 1403  BP: 134/73   Pulse:  84  Temp: 36.7 C 36.3 C  Resp: 18 12    Last Pain:  Filed Vitals:   07/08/15 1406  PainSc: 0-No pain      Patients Stated Pain Goal: 0 ([id number])  Complications: No apparent anesthesia complications

## 2015-07-08 NOTE — Anesthesia Postprocedure Evaluation (Signed)
Anesthesia Post Note  Patient: Clifford [name]  Procedure(s) Performed: Procedure(s) (LRB): CORONARY ARTERY BYPASS GRAFTING (CABG)x three using left internal mammary artery to left anterior decending artery, left greater saphenous vein grafts to diagonal and posterolateral. Left leg greater saphenous leg vein via endoscope.  (N/A) TRANSESOPHAGEAL ECHOCARDIOGRAM (TEE) (N/A) CYSTOSCOPY FLEXIBLE WITH BALLOON DILATION OF BLADDER NECK CONTRACTURE WITH DIFFICULT CATHETER PLACEMENT (N/A)  Patient location during evaluation: SICU Anesthesia Type: General Level of consciousness: sedated Pain management: pain level controlled Vital Signs Assessment: post-procedure vital signs reviewed and stable Respiratory status: patient remains intubated per anesthesia plan Cardiovascular status: stable Anesthetic complications: no    Last Vitals:  Filed Vitals:   07/08/15 0354 07/08/15 1403  BP: 134/73   Pulse:  84  Temp: 36.7 C 36.3 C  Resp: 18 12    Last Pain:  Filed Vitals:   07/08/15 1406  PainSc: 0-No pain                 Coulton Schlink DANIEL

## 2015-07-08 NOTE — Progress Notes (Signed)
Inpatient Diabetes Program Recommendations  AACE/ADA: New Consensus Statement on Inpatient Glycemic Control (2015)  Target Ranges:  Prepandial:   less than 140 mg/dL      Peak postprandial:   less than 180 mg/dL (1-2 hours)      Critically ill patients:  140 - 180 mg/dL   Review of Glycemic Control Results for Clifford [name], Clifford [name] (MRN [id number]) as of 07/08/2015 13:51  Ref. Range 07/07/2015 03:31  Hemoglobin A1C Latest Ref Range: 4.8-5.6 % 7.9 (H)   Inpatient Diabetes Program Recommendations:    MD, is this a new diagnosis? If so, it will need to be addressed prior to discharge asap.   Recommend starting Novolog moderate scale q4.   Thank you  Raoul Pitch BSN, RN,CDE Inpatient Diabetes Coordinator [phone] (team pager)

## 2015-07-08 NOTE — Anesthesia Procedure Notes (Signed)
Procedure Name: Intubation Date/Time: 07/08/2015 7:50 AM Performed by: Neldon Newport Pre-anesthesia Checklist: Patient being monitored, Suction available, Emergency Drugs available, Patient identified and Timeout performed Patient Re-evaluated:Patient Re-evaluated prior to inductionOxygen Delivery Method: Circle system utilized Preoxygenation: Pre-oxygenation with 100% oxygen Intubation Type: IV induction Ventilation: Mask ventilation without difficulty Laryngoscope Size: Mac and 3 Grade View: Grade I Tube type: Oral Number of attempts: 1 Secured at: 23 cm Tube secured with: Tape Dental Injury: Teeth and Oropharynx as per pre-operative assessment

## 2015-07-08 NOTE — Brief Op Note (Addendum)
      [address]       [place] [address]             [phone]    07/08/2015  11:49 AM  PATIENT:  Clifford [name]  70 y.o. male  PRE-OPERATIVE DIAGNOSIS:  Coronary artery disease  POST-OPERATIVE DIAGNOSIS:  Coronary artery disease  PROCEDURE:  Procedure(s):  CORONARY ARTERY BYPASS GRAFTING x 3 -LIMA to LAD -SVG to DIAGONAL -SVG to PLVB  ENDOSCOPIC HARVEST GREATER SAPHENOUS VEIN -Left Leg Placement of left femoral aline with [name]  TRANSESOPHAGEAL ECHOCARDIOGRAM (TEE) (N/A)  CYSTOSCOPY FLEXIBLE WITH BALLOON DILATION OF BLADDER NECK CONTRACTURE WITH DIFFICULT CATHETER PLACEMENT (N/A)  SURGEON:  Surgeon(s) and Role: Panel 1:    * [name], MD - Primary  Panel 2:    * Raynelle Bring, MD - Primary  PHYSICIAN ASSISTANT: Ellwood Handler PA-C  ANESTHESIA:   general  EBL:  Total I/O In: 2000 [I.V.:2000] Out: 550 [Urine:550]  BLOOD ADMINISTERED:CELLSAVER  DRAINS: Left Pleural Chest Tube, Mediastinal Chest Drain   LOCAL MEDICATIONS USED:  NONE  SPECIMEN:  No Specimen  DISPOSITION OF SPECIMEN:  N/A  COUNTS:  YES  TOURNIQUET:  * No tourniquets in log *  DICTATION: .Dragon Dictation  PLAN OF CARE: Admit to inpatient   PATIENT DISPOSITION:  ICU - intubated and hemodynamically stable.   Delay start of Pharmacological VTE agent (>24hrs) due to surgical blood loss or risk of bleeding: yes

## 2015-07-08 NOTE — Procedures (Signed)
Extubation Procedure Note  Patient Details:   Name: Clifford [name] DOB: [birth year] MRN: [id number]   Airway Documentation:     Evaluation  O2 sats: stable throughout Complications: No apparent complications Patient did tolerate procedure well. Bilateral Breath Sounds: Clear, Diminished   Yes   Pt was extubated to a 3L N/C  PT was able to say his name. Sats are stable. NIF (-25) VC (5L)  [name], Leonie Douglas 07/08/2015, 6:40 PM

## 2015-07-08 NOTE — Op Note (Signed)
Preoperative diagnosis:  1. Difficult Foley catheterization 2.  Bladder neck contracture/stenosis  Postoperative diagnosis: Same as above  Procedure(s): 1. Cystoscopy 2.  Balloon dilation of bladder neck stenosis 3.  Difficult urethral catheter placement  Surgeon: Dr. Roxy Horseman, Jr  Anesthesia: General  Complications: None  EBL: None  Indication: [name] is a 70 year old gentleman with a history of prostate cancer status post radical prostatectomy.  He does have a known bladder neck contracture/stenosis.  A urologic consult was obtained last night by the cardiothoracic surgery service to place a catheter at bedside in anticipation of his CABG today. However, the patient refused catheter placement last evening.  He was taken to the operating room this morning and after anesthesia was induced, multiple attempts by the nursing staff were made to place a catheter unsuccessfully. A urologic consultation was therefore obtained intraoperatively.  Description of procedure:  The nursing staff has previously tried a 9 and 14 [place] catheter as well as coude catheters,and a 71 [place] and 10 [place] catheter. Therefore, I decided to immediately proceed to flexible cystoscopy.  This was performed after the patient was prepped and draped in the usual sterile fashion.  He had been administered perioperative antibiotics.  Inspection of the anterior urethra was unremarkable.  He was noted to have a tight bladder neck contracture.  I was able to pass a 0.38 sensor guidewire through the contracture into the bladder without difficulty and under direct vision.  I then inserted the Ultraxx nephrostomy balloon dilator over the wire and across the bladder neck contracture.  This was then inflated to 24 [place] at a pressure of 16 mmHg and left for 3 minutes. i then passed a 63 [place] [employer] tip catheter over the wire with minimal resistance into the bladder.  This drained grossly clear urine. 10 cc of  sterile water was inserted into the catheter balloon.  This was placed to straight drainage.  The procedure was then turned over to Dr. Pia Mau to proceed with CABG.  I recommended that his catheter be left indwelling for 48-72 hours prior to discharge and a voiding trial.  The patient has previously been instructed to call me for further follow-up once he has recovered from his heart surgery.

## 2015-07-08 NOTE — Progress Notes (Signed)
Patient ID: Clifford [name], male   DOB: Mar 24, 1945, 70 y.o.   MRN: [id number] EVENING ROUNDS NOTE :     [address]       [place] [address]             [phone]                 Day of Surgery Procedure(s) (LRB): CORONARY ARTERY BYPASS GRAFTING (CABG)x three using left internal mammary artery to left anterior decending artery, left greater saphenous vein grafts to diagonal and posterolateral. Left leg greater saphenous leg vein via endoscope.  (N/A) TRANSESOPHAGEAL ECHOCARDIOGRAM (TEE) (N/A) CYSTOSCOPY FLEXIBLE WITH BALLOON DILATION OF BLADDER NECK CONTRACTURE WITH DIFFICULT CATHETER PLACEMENT (N/A)  Total Length of Stay:  LOS: 6 days  BP 134/73 mmHg  Pulse 74  Temp(Src) 96.6 F (35.9 C) (Oral)  Resp 12  Ht 5\' 8"  (1.727 m)  Wt 168 lb 14 oz (76.6 kg)  BMI 25.68 kg/m2  SpO2 100%  .Intake/Output      05/11 0701 - 05/12 0700 05/12 0701 - 05/13 0700   P.O. 680    I.V. (mL/kg) 143 (1.9) 3450 (45)   Blood  450   IV Piggyback  2175   Total Intake(mL/kg) 823 (10.7) 6075 (79.3)   Urine (mL/kg/hr) 575 (0.3) 1250 (1.6)   Blood  1100 (1.4)   Total Output 575 2350   Net +248 +3725          . sodium chloride    . [START ON 07/09/2015] sodium chloride    . sodium chloride 10 mL/hr at 07/08/15 1400  . dexmedetomidine 0.3 mcg/kg/hr (07/08/15 1700)  . insulin (NOVOLIN-R) infusion 3.6 Units/hr (07/08/15 1400)  . lactated ringers 20 mL/hr at 07/08/15 1400  . lactated ringers 20 mL/hr at 07/08/15 1400  . nitroGLYCERIN Stopped (07/08/15 1600)  . phenylephrine (NEO-SYNEPHRINE) Adult infusion Stopped (07/08/15 1640)     Lab Results  Component Value Date   WBC 12.6* 07/08/2015   HGB 9.7* 07/08/2015   HCT 27.5* 07/08/2015   PLT 105* 07/08/2015   GLUCOSE 168* 07/08/2015   CHOL 118 07/03/2015   TRIG 120 07/03/2015   HDL 43 07/03/2015   LDLCALC 51 07/03/2015   ALT 56* 01/17/2015   AST 37 01/17/2015   NA 136 07/08/2015   K 3.6 07/08/2015   CL 99* 07/08/2015   CREATININE 0.90 07/08/2015   BUN 13 07/08/2015   CO2 24 07/07/2015   TSH 1.13 01/14/2014   PSA 0.00 Repeated and verified X2.* 11/27/2010   INR 1.69* 07/08/2015   HGBA1C 7.9* 07/07/2015   MICROALBUR 1.5 01/17/2015   Stable post op, not bleeding, waking up neuro intact  [name] MD  Beeper [phone] Office [phone] 07/08/2015 5:29 PM

## 2015-07-09 ENCOUNTER — Inpatient Hospital Stay (HOSPITAL_COMMUNITY): Payer: 59

## 2015-07-09 LAB — POCT I-STAT, CHEM 8
BUN: 13 mg/dL (ref 6–20)
CREATININE: 0.9 mg/dL (ref 0.61–1.24)
Calcium, Ion: 1.21 mmol/L (ref 1.13–1.30)
Chloride: 103 mmol/L (ref 101–111)
GLUCOSE: 181 mg/dL — AB (ref 65–99)
HEMATOCRIT: 31 % — AB (ref 39.0–52.0)
Hemoglobin: 10.5 g/dL — ABNORMAL LOW (ref 13.0–17.0)
Potassium: 4.2 mmol/L (ref 3.5–5.1)
Sodium: 138 mmol/L (ref 135–145)
TCO2: 19 mmol/L (ref 0–100)

## 2015-07-09 LAB — CREATININE, SERUM
CREATININE: 1.18 mg/dL (ref 0.61–1.24)
GFR, EST NON AFRICAN AMERICAN: 60 mL/min — AB (ref >60)

## 2015-07-09 LAB — GLUCOSE, CAPILLARY
GLUCOSE-CAPILLARY: 103 mg/dL — AB (ref 65–99)
GLUCOSE-CAPILLARY: 127 mg/dL — AB (ref 65–99)
GLUCOSE-CAPILLARY: 114 mg/dL — AB (ref 65–99)
GLUCOSE-CAPILLARY: 127 mg/dL — AB (ref 65–99)
GLUCOSE-CAPILLARY: 112 mg/dL — AB (ref 65–99)
GLUCOSE-CAPILLARY: 109 mg/dL — AB (ref 65–99)
GLUCOSE-CAPILLARY: 122 mg/dL — AB (ref 65–99)
GLUCOSE-CAPILLARY: 124 mg/dL — AB (ref 65–99)
GLUCOSE-CAPILLARY: 163 mg/dL — AB (ref 65–99)
Glucose-Capillary: 101 mg/dL — ABNORMAL HIGH (ref 65–99)
Glucose-Capillary: 102 mg/dL — ABNORMAL HIGH (ref 65–99)
Glucose-Capillary: 103 mg/dL — ABNORMAL HIGH (ref 65–99)
Glucose-Capillary: 107 mg/dL — ABNORMAL HIGH (ref 65–99)
Glucose-Capillary: 90 mg/dL (ref 65–99)
Glucose-Capillary: 98 mg/dL (ref 65–99)
Glucose-Capillary: 180 mg/dL — ABNORMAL HIGH (ref 65–99)

## 2015-07-09 LAB — BASIC METABOLIC PANEL
ANION GAP: 9 (ref 5–15)
BUN: 14 mg/dL (ref 6–20)
CHLORIDE: 109 mmol/L (ref 101–111)
CO2: 20 mmol/L — AB (ref 22–32)
CREATININE: 1.27 mg/dL — AB (ref 0.61–1.24)
Calcium: 8.1 mg/dL — ABNORMAL LOW (ref 8.9–10.3)
GFR calc non Af Amer: 55 mL/min — ABNORMAL LOW (ref >60)
Glucose, Bld: 112 mg/dL — ABNORMAL HIGH (ref 65–99)
Potassium: 4.5 mmol/L (ref 3.5–5.1)
Sodium: 138 mmol/L (ref 135–145)

## 2015-07-09 LAB — CBC
HEMATOCRIT: 26.4 % — AB (ref 39.0–52.0)
HEMOGLOBIN: 9 g/dL — AB (ref 13.0–17.0)
MCH: 31.8 pg (ref 26.0–34.0)
MCHC: 34.1 g/dL (ref 30.0–36.0)
MCV: 93.3 fL (ref 78.0–100.0)
Platelets: 121 10*3/uL — ABNORMAL LOW (ref 150–400)
RBC: 2.83 MIL/uL — ABNORMAL LOW (ref 4.22–5.81)
RDW: 13.4 % (ref 11.5–15.5)
WBC: 12.6 10*3/uL — ABNORMAL HIGH (ref 4.0–10.5)

## 2015-07-09 LAB — MAGNESIUM
MAGNESIUM: 2.3 mg/dL (ref 1.7–2.4)
Magnesium: 2.4 mg/dL (ref 1.7–2.4)

## 2015-07-09 MED ORDER — ENOXAPARIN SODIUM 30 MG/0.3ML ~~LOC~~ SOLN
30.0000 mg | Freq: Every day | SUBCUTANEOUS | Status: DC
Start: 2015-07-09 — End: 2015-07-09

## 2015-07-09 MED ORDER — INSULIN DETEMIR 100 UNIT/ML ~~LOC~~ SOLN
15.0000 [IU] | Freq: Once | SUBCUTANEOUS | Status: AC
  Administered 2015-07-09: 15 [IU] via SUBCUTANEOUS
  Filled 2015-07-09: qty 0.15

## 2015-07-09 MED ORDER — INSULIN DETEMIR 100 UNIT/ML ~~LOC~~ SOLN
15.0000 [IU] | Freq: Every day | SUBCUTANEOUS | Status: DC
  Filled 2015-07-09: qty 0.15

## 2015-07-09 MED ORDER — INSULIN ASPART 100 UNIT/ML ~~LOC~~ SOLN
0.0000 [IU] | SUBCUTANEOUS | Status: DC
  Administered 2015-07-09: 4 [IU] via SUBCUTANEOUS
  Administered 2015-07-09: 2 [IU] via SUBCUTANEOUS
  Administered 2015-07-09: 4 [IU] via SUBCUTANEOUS
  Administered 2015-07-10 (×3): 2 [IU] via SUBCUTANEOUS

## 2015-07-09 NOTE — Progress Notes (Signed)
Patient ID: Clifford [name], male   DOB: [birth year], 70 y.o.   MRN: [id number] EVENING ROUNDS NOTE :     [address]       [place] [address]             [phone]                 1 Day Post-Op Procedure(s) (LRB): CORONARY ARTERY BYPASS GRAFTING (CABG)x three using left internal mammary artery to left anterior decending artery, left greater saphenous vein grafts to diagonal and posterolateral. Left leg greater saphenous leg vein via endoscope.  (N/A) TRANSESOPHAGEAL ECHOCARDIOGRAM (TEE) (N/A) CYSTOSCOPY FLEXIBLE WITH BALLOON DILATION OF BLADDER NECK CONTRACTURE WITH DIFFICULT CATHETER PLACEMENT (N/A)  Total Length of Stay:  LOS: 7 days  BP 137/70 mmHg  Pulse 109  Temp(Src) 97.7 F (36.5 C) (Oral)  Resp 26  Ht 5\' 8"  (1.727 m)  Wt 84.6 kg (186 lb 8.2 oz)  BMI 28.37 kg/m2  SpO2 92%  .Intake/Output      05/13 0701 - 05/14 0700   P.O. 300   I.V. (mL/kg) 273.5 (3.2)   Blood    IV Piggyback    Total Intake(mL/kg) 573.5 (6.8)   Urine (mL/kg/hr) 650 (0.6)   Blood    Chest Tube 120 (0.1)   Total Output 770   Net -196.5         . sodium chloride    . lactated ringers 20 mL/hr at 07/09/15 1800  . nitroGLYCERIN 10 mcg/min (07/09/15 0500)  . phenylephrine (NEO-SYNEPHRINE) Adult infusion Stopped (07/09/15 0000)     Lab Results  Component Value Date   WBC 12.6* 07/09/2015   HGB 10.5* 07/09/2015   HCT 31.0* 07/09/2015   PLT 121* 07/09/2015   GLUCOSE 181* 07/09/2015   CHOL 118 07/03/2015   TRIG 120 07/03/2015   HDL 43 07/03/2015   LDLCALC 51 07/03/2015   ALT 56* 01/17/2015   AST 37 01/17/2015   NA 138 07/09/2015   K 4.2 07/09/2015   CL 103 07/09/2015   CREATININE 1.18 07/09/2015   BUN 13 07/09/2015   CO2 20* 07/09/2015   TSH 1.13 01/14/2014   PSA 0.00 Repeated and verified X2.* 11/27/2010   INR 1.69* 07/08/2015   HGBA1C 7.9* 07/07/2015   MICROALBUR 1.5 01/17/2015   Stable day, a line and sg out Holding sinus   [name] MD  Beeper  [phone] Office [phone] 07/09/2015 7:13 PM

## 2015-07-09 NOTE — Progress Notes (Signed)
Patient ID: Clifford [name], male   DOB: 01-Dec-1945, 70 y.o.   MRN: [id number] TCTS DAILY ICU PROGRESS NOTE                   [address]            [place] [address]          [phone]   1 Day Post-Op Procedure(s) (LRB): CORONARY ARTERY BYPASS GRAFTING (CABG)x three using left internal mammary artery to left anterior decending artery, left greater saphenous vein grafts to diagonal and posterolateral. Left leg greater saphenous leg vein via endoscope.  (N/A) TRANSESOPHAGEAL ECHOCARDIOGRAM (TEE) (N/A) CYSTOSCOPY FLEXIBLE WITH BALLOON DILATION OF BLADDER NECK CONTRACTURE WITH DIFFICULT CATHETER PLACEMENT (N/A)  Total Length of Stay:  LOS: 7 days   Subjective: Awake and alert , extubated  Objective: Vital signs in last 24 hours: Temp:  [96.3 F (35.7 C)-101.5 F (38.6 C)] 99.3 F (37.4 C) (05/13 0530) Pulse Rate:  [73-123] 108 (05/13 0530) Cardiac Rhythm:  [-] Sinus tachycardia (05/13 0500) Resp:  [12-32] 19 (05/13 0530) BP: (88-147)/(56-129) 126/70 mmHg (05/13 0530) SpO2:  [91 %-100 %] 97 % (05/13 0530) Arterial Line BP: (82-142)/(50-82) 118/67 mmHg (05/13 0530) FiO2 (%):  [40 %-50 %] 40 % (05/12 1830) Weight:  [168 lb 14 oz (76.6 kg)-186 lb 8.2 oz (84.6 kg)] 186 lb 8.2 oz (84.6 kg) (05/13 0500)  Filed Weights   07/07/15 0409 07/08/15 1630 07/09/15 0500  Weight: 168 lb 14 oz (76.6 kg) 168 lb 14 oz (76.6 kg) 186 lb 8.2 oz (84.6 kg)    Weight change:    Hemodynamic parameters for last 24 hours: PAP: (22-60)/(11-38) 38/23 mmHg CO:  [3.2 L/min-6.5 L/min] 5 L/min CI:  [1.7 L/min/m2-3.4 L/min/m2] 2.8 L/min/m2  Intake/Output from previous day: 05/12 0701 - 05/13 0700 In: 8155.3 [P.O.:200; I.V.:4830.3; Blood:450; IV [id number] Out: [id number] [Urine:2805; Blood:1100; Chest Tube:145]  Intake/Output this shift:    Current Meds: Scheduled Meds: . acetaminophen  1,000 mg Oral Q6H   Or  . acetaminophen (TYLENOL) oral liquid 160 mg/5 mL  1,000 mg Per Tube  Q6H  . aspirin EC  325 mg Oral Daily   Or  . aspirin  324 mg Per Tube Daily  . bisacodyl  10 mg Oral Daily   Or  . bisacodyl  10 mg Rectal Daily  . cefUROXime (ZINACEF)  IV  1.5 g Intravenous Q12H  . docusate sodium  200 mg Oral Daily  . ezetimibe-simvastatin  1 tablet Oral Daily  . famotidine (PEPCID) IV  20 mg Intravenous Q12H  . insulin regular  0-10 Units Intravenous TID WC  . metoCLOPramide (REGLAN) injection  10 mg Intravenous Q6H  . metoprolol tartrate  12.5 mg Oral BID   Or  . metoprolol tartrate  12.5 mg Per Tube BID  . [START ON 07/10/2015] pantoprazole  40 mg Oral Daily  . sodium chloride flush  3 mL Intravenous Q12H   Continuous Infusions: . sodium chloride 20 mL/hr at 07/09/15 0500  . sodium chloride    . sodium chloride 20 mL/hr at 07/08/15 1900  . dexmedetomidine Stopped (07/08/15 1730)  . insulin (NOVOLIN-R) infusion 1.4 Units/hr (07/09/15 0000)  . lactated ringers 20 mL/hr at 07/09/15 0500  . lactated ringers 20 mL/hr at 07/09/15 0100  . nitroGLYCERIN Stopped (07/08/15 1600)  . phenylephrine (NEO-SYNEPHRINE) Adult infusion Stopped (07/09/15 0000)   PRN Meds:.sodium chloride, ALPRAZolam, lactated ringers, metoprolol, midazolam, morphine injection, ondansetron (ZOFRAN) IV, oxyCODONE, sodium chloride flush,  traMADol  General appearance: alert, cooperative and no distress Neurologic: intact Heart: regular rate and rhythm, S1, S2 normal, no murmur, click, rub or gallop Lungs: diminished breath sounds bibasilar Abdomen: soft, non-tender; bowel sounds normal; no masses,  no organomegaly Extremities: extremities normal, atraumatic, no cyanosis or edema and Homans sign is negative, no sign of DVT Wound: sternum stable  Lab Results: CBC: Recent Labs  07/08/15 2008 07/09/15 0427  WBC 12.6* 12.6*  HGB 9.8* 9.0*  HCT 27.6* 26.4*  PLT 127* 121*   BMET:  Recent Labs  07/07/15 0331  07/08/15 2001 07/08/15 2008 07/09/15 0427  NA 139  < > 140  --  138  K 3.9   < > 4.8  --  4.5  CL 102  < > 106  --  109  CO2 24  --   --   --  20*  GLUCOSE 134*  < > 101*  --  112*  BUN 16  < > 14  --  14  CREATININE 1.22  < > 1.10 1.17 1.27*  CALCIUM 9.4  --   --   --  8.1*  < > = values in this interval not displayed.  PT/INR:  Recent Labs  07/08/15 1355  LABPROT 19.9*  INR 1.69*   Radiology: Dg Chest Port 1 View  07/09/2015  CLINICAL DATA:  Status post CABG. EXAM: PORTABLE CHEST 1 VIEW COMPARISON:  Jul 08, 2015 FINDINGS: There is increased density in the central left lung. There is a possible pleural stipe but there are lung markings on both sides. The ET tube has been removed as has the NG tube. The PA catheter is in good position. No right-sided pneumothorax. Mild atelectasis remains in the right base. The cardiomediastinal silhouette is otherwise unchanged. IMPRESSION: There is an unusual appearance of the left hemothorax. There is increased density centrally after removal of the ET and NG tubes. A pleural stripe is not excluded but there are lung markings on both sides. Also, there is a chest tube in place. The findings could be due to something on the patient, aspiration of high density material, or an unusual skin fold. The presence of a chest tube and peripheral lung markings, at least inferiorly, makes an unusual pneumothorax less likely. Findings called to the patient's physician on the floor. Electronically Signed   By: Dorise Bullion III M.D   On: 07/09/2015 07:08   Dg Chest Port 1 View  07/08/2015  CLINICAL DATA:  Status post coronary bypass grafting EXAM: PORTABLE CHEST 1 VIEW COMPARISON:  07/02/2015 FINDINGS: Postsurgical changes are now seen. A mediastinal drain and left thoracostomy catheter are noted. No pneumothorax is seen. An endotracheal tube and nasogastric catheter is well as a Swan-Ganz catheter are noted in satisfactory position. Minimal left basilar atelectasis is noted. No acute bony abnormality is seen. IMPRESSION: Postoperative change with  tubes and lines as described. Minimal left basilar atelectasis is seen. Electronically Signed   By: Inez Catalina M.D.   On: 07/08/2015 14:43     Assessment/Plan: S/P Procedure(s) (LRB): CORONARY ARTERY BYPASS GRAFTING (CABG)x three using left internal mammary artery to left anterior decending artery, left greater saphenous vein grafts to diagonal and posterolateral. Left leg greater saphenous leg vein via endoscope.  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

## 2015-07-10 ENCOUNTER — Inpatient Hospital Stay (HOSPITAL_COMMUNITY): Payer: 59

## 2015-07-10 LAB — BASIC METABOLIC PANEL
Anion gap: 12 (ref 5–15)
BUN: 20 mg/dL (ref 6–20)
CO2: 21 mmol/L — ABNORMAL LOW (ref 22–32)
Calcium: 8.9 mg/dL (ref 8.9–10.3)
Chloride: 103 mmol/L (ref 101–111)
Creatinine, Ser: 1.31 mg/dL — ABNORMAL HIGH (ref 0.61–1.24)
GFR calc Af Amer: >60 mL/min (ref >60)
GFR calc non Af Amer: 53 mL/min — ABNORMAL LOW (ref >60)
Glucose, Bld: 188 mg/dL — ABNORMAL HIGH (ref 65–99)
Potassium: 4.9 mmol/L (ref 3.5–5.1)
Sodium: 136 mmol/L (ref 135–145)

## 2015-07-10 LAB — CBC
HCT: 29.8 % — ABNORMAL LOW (ref 39.0–52.0)
Hemoglobin: 9.9 g/dL — ABNORMAL LOW (ref 13.0–17.0)
MCH: 31.7 pg (ref 26.0–34.0)
MCHC: 33.2 g/dL (ref 30.0–36.0)
MCV: 95.5 fL (ref 78.0–100.0)
Platelets: 141 10*3/uL — ABNORMAL LOW (ref 150–400)
RBC: 3.12 MIL/uL — ABNORMAL LOW (ref 4.22–5.81)
RDW: 13.9 % (ref 11.5–15.5)
WBC: 19.4 10*3/uL — ABNORMAL HIGH (ref 4.0–10.5)

## 2015-07-10 LAB — GLUCOSE, CAPILLARY
GLUCOSE-CAPILLARY: 121 mg/dL — AB (ref 65–99)
GLUCOSE-CAPILLARY: 129 mg/dL — AB (ref 65–99)
GLUCOSE-CAPILLARY: 194 mg/dL — AB (ref 65–99)
Glucose-Capillary: 136 mg/dL — ABNORMAL HIGH (ref 65–99)
Glucose-Capillary: 147 mg/dL — ABNORMAL HIGH (ref 65–99)
Glucose-Capillary: 142 mg/dL — ABNORMAL HIGH (ref 65–99)

## 2015-07-10 MED ORDER — SODIUM CHLORIDE 0.9 % IV SOLN: 250.0000 mL | INTRAVENOUS | Status: DC | PRN

## 2015-07-10 MED ORDER — GUAIFENESIN ER 600 MG PO TB12: 600.0000 mg | ORAL_TABLET | Freq: Two times a day (BID) | ORAL | Status: DC | PRN

## 2015-07-10 MED ORDER — ENOXAPARIN SODIUM 40 MG/0.4ML ~~LOC~~ SOLN
40.0000 mg | SUBCUTANEOUS | Status: DC
  Administered 2015-07-11 – 2015-07-14 (×4): 40 mg via SUBCUTANEOUS
  Filled 2015-07-10 (×4): qty 0.4

## 2015-07-10 MED ORDER — SODIUM CHLORIDE 0.9% FLUSH
3.0000 mL | Freq: Two times a day (BID) | INTRAVENOUS | Status: DC
  Administered 2015-07-10 – 2015-07-13 (×8): 3 mL via INTRAVENOUS

## 2015-07-10 MED ORDER — MOVING RIGHT ALONG BOOK
Freq: Once | Status: AC
  Administered 2015-07-10: 1
  Filled 2015-07-10: qty 1

## 2015-07-10 MED ORDER — ONDANSETRON HCL 4 MG PO TABS: 4.0000 mg | ORAL_TABLET | Freq: Four times a day (QID) | ORAL | Status: DC | PRN

## 2015-07-10 MED ORDER — ONDANSETRON HCL 4 MG/2ML IJ SOLN: 4.0000 mg | Freq: Four times a day (QID) | INTRAMUSCULAR | Status: DC | PRN

## 2015-07-10 MED ORDER — BISACODYL 10 MG RE SUPP: 10.0000 mg | Freq: Every day | RECTAL | Status: DC | PRN

## 2015-07-10 MED ORDER — PANTOPRAZOLE SODIUM 40 MG PO TBEC
40.0000 mg | DELAYED_RELEASE_TABLET | Freq: Every day | ORAL | Status: DC
  Administered 2015-07-10 – 2015-07-14 (×5): 40 mg via ORAL
  Filled 2015-07-10 (×5): qty 1

## 2015-07-10 MED ORDER — VITAMIN C 500 MG PO TABS
250.0000 mg | ORAL_TABLET | Freq: Every day | ORAL | Status: DC
  Administered 2015-07-10 – 2015-07-14 (×5): 250 mg via ORAL
  Filled 2015-07-10 (×5): qty 1

## 2015-07-10 MED ORDER — DOCUSATE SODIUM 100 MG PO CAPS
200.0000 mg | ORAL_CAPSULE | Freq: Every day | ORAL | Status: DC
  Administered 2015-07-10 – 2015-07-13 (×4): 200 mg via ORAL
  Filled 2015-07-10 (×5): qty 2

## 2015-07-10 MED ORDER — INSULIN DETEMIR 100 UNIT/ML ~~LOC~~ SOLN
20.0000 [IU] | Freq: Every day | SUBCUTANEOUS | Status: DC
  Administered 2015-07-10 – 2015-07-14 (×5): 20 [IU] via SUBCUTANEOUS
  Filled 2015-07-10 (×6): qty 0.2

## 2015-07-10 MED ORDER — CETYLPYRIDINIUM CHLORIDE 0.05 % MT LIQD
7.0000 mL | Freq: Two times a day (BID) | OROMUCOSAL | Status: DC
  Administered 2015-07-10 – 2015-07-13 (×6): 7 mL via OROMUCOSAL

## 2015-07-10 MED ORDER — BISACODYL 5 MG PO TBEC
10.0000 mg | DELAYED_RELEASE_TABLET | Freq: Every day | ORAL | Status: DC | PRN
  Filled 2015-07-10: qty 2

## 2015-07-10 MED ORDER — INSULIN ASPART 100 UNIT/ML ~~LOC~~ SOLN
0.0000 [IU] | Freq: Three times a day (TID) | SUBCUTANEOUS | Status: DC
  Administered 2015-07-10: 4 [IU] via SUBCUTANEOUS
  Administered 2015-07-10 – 2015-07-14 (×7): 2 [IU] via SUBCUTANEOUS

## 2015-07-10 MED ORDER — TRAMADOL HCL 50 MG PO TABS
50.0000 mg | ORAL_TABLET | Freq: Four times a day (QID) | ORAL | Status: DC
  Administered 2015-07-10 – 2015-07-13 (×9): 50 mg via ORAL
  Filled 2015-07-10 (×11): qty 1

## 2015-07-10 MED ORDER — ENOXAPARIN SODIUM 30 MG/0.3ML ~~LOC~~ SOLN
30.0000 mg | SUBCUTANEOUS | Status: DC
  Administered 2015-07-10: 30 mg via SUBCUTANEOUS
  Filled 2015-07-10: qty 0.3

## 2015-07-10 MED ORDER — ASPIRIN EC 325 MG PO TBEC
325.0000 mg | DELAYED_RELEASE_TABLET | Freq: Every day | ORAL | Status: DC
  Administered 2015-07-10 – 2015-07-14 (×5): 325 mg via ORAL
  Filled 2015-07-10 (×5): qty 1

## 2015-07-10 MED ORDER — OXYCODONE HCL 5 MG PO TABS: 5.0000 mg | ORAL_TABLET | ORAL | Status: DC | PRN

## 2015-07-10 MED ORDER — SODIUM CHLORIDE 0.9% FLUSH: 3.0000 mL | INTRAVENOUS | Status: DC | PRN

## 2015-07-10 MED ORDER — METOPROLOL TARTRATE 12.5 MG HALF TABLET
12.5000 mg | ORAL_TABLET | Freq: Two times a day (BID) | ORAL | Status: DC
  Administered 2015-07-10 (×2): 12.5 mg via ORAL
  Filled 2015-07-10 (×2): qty 1

## 2015-07-10 NOTE — Progress Notes (Signed)
Patient transferred to 2W22 on monitor with no complications. Pt up in chair with new tele box applied. Receiving RN at bedside.   Clifford [name]

## 2015-07-10 NOTE — Progress Notes (Signed)
Patient ID: Clifford [name], male   DOB: [birth year], 70 y.o.   MRN: [id number] TCTS DAILY ICU PROGRESS NOTE                   [address]            [place] [address]          [phone]   2 Days Post-Op Procedure(s) (LRB): CORONARY ARTERY BYPASS GRAFTING (CABG)x three using left internal mammary artery to left anterior decending artery, left greater saphenous vein grafts to diagonal and posterolateral. Left leg greater saphenous leg vein via endoscope.  (N/A) TRANSESOPHAGEAL ECHOCARDIOGRAM (TEE) (N/A) CYSTOSCOPY FLEXIBLE WITH BALLOON DILATION OF BLADDER NECK CONTRACTURE WITH DIFFICULT CATHETER PLACEMENT (N/A)  Total Length of Stay:  LOS: 8 days   Subjective: Awake and alert this am, did not walk last pm but wanted to.  Objective: Vital signs in last 24 hours: Temp:  [97.7 F (36.5 C)-98.4 F (36.9 C)] 98.2 F (36.8 C) (05/14 0445) Pulse Rate:  [91-120] 96 (05/14 0700) Cardiac Rhythm:  [-] Sinus tachycardia (05/14 0400) Resp:  [13-34] 15 (05/14 0700) BP: (96-148)/(61-83) 114/66 mmHg (05/14 0700) SpO2:  [89 %-97 %] 96 % (05/14 0700) Arterial Line BP: (118-151)/(61-74) 151/74 mmHg (05/13 1300) Weight:  [84.3 kg (185 lb 13.6 oz)] 84.3 kg (185 lb 13.6 oz) (05/14 0500)  Filed Weights   07/08/15 1630 07/09/15 0500 07/10/15 0500  Weight: 76.6 kg (168 lb 14 oz) 84.6 kg (186 lb 8.2 oz) 84.3 kg (185 lb 13.6 oz)    Weight change: 7.7 kg (16 lb 15.6 oz)   Hemodynamic parameters for last 24 hours:    Intake/Output from previous day: 05/13 0701 - 05/14 0700 In: 1113.5 [P.O.:600; I.V.:513.5] Out: 1215 [Urine:1075; Chest Tube:140]  Intake/Output this shift:    Current Meds: Scheduled Meds: . acetaminophen  1,000 mg Oral Q6H   Or  . acetaminophen (TYLENOL) oral liquid 160 mg/5 mL  1,000 mg Per Tube Q6H  . aspirin EC  325 mg Oral Daily   Or  . aspirin  324 mg Per Tube Daily  . bisacodyl  10 mg Oral Daily   Or  . bisacodyl  10 mg Rectal Daily  . docusate  sodium  200 mg Oral Daily  . ezetimibe-simvastatin  1 tablet Oral Daily  . insulin aspart  0-24 Units Subcutaneous Q4H  . insulin detemir  15 Units Subcutaneous Daily  . metoprolol tartrate  12.5 mg Oral BID   Or  . metoprolol tartrate  12.5 mg Per Tube BID  . pantoprazole  40 mg Oral Daily  . sodium chloride flush  3 mL Intravenous Q12H   Continuous Infusions: . sodium chloride    . lactated ringers 20 mL/hr at 07/10/15 0400  . nitroGLYCERIN 10 mcg/min (07/09/15 0500)  . phenylephrine (NEO-SYNEPHRINE) Adult infusion Stopped (07/09/15 0000)   PRN Meds:.ALPRAZolam, lactated ringers, metoprolol, morphine injection, ondansetron (ZOFRAN) IV, oxyCODONE, sodium chloride flush, traMADol  General appearance: alert, cooperative and no distress Neurologic: intact Heart: regular rate and rhythm, S1, S2 normal, no murmur, click, rub or gallop Lungs: diminished breath sounds bibasilar Abdomen: soft, non-tender; bowel sounds normal; no masses,  no organomegaly Extremities: extremities normal, atraumatic, no cyanosis or edema and Homans sign is negative, no sign of DVT Wound: sternum intact  Lab Results: CBC:  Recent Labs  07/09/15 0427 07/09/15 1813 07/10/15 0445  WBC 12.6*  --  19.4*  HGB 9.0* 10.5* 9.9*  HCT 26.4* 31.0* 29.8*  PLT 121*  --  141*   BMET:   Recent Labs  07/09/15 0427 07/09/15 1813 07/09/15 1820 07/10/15 0445  NA 138 138  --  136  K 4.5 4.2  --  4.9  CL 109 103  --  103  CO2 20*  --   --  21*  GLUCOSE 112* 181*  --  188*  BUN 14 13  --  20  CREATININE 1.27* 0.90 1.18 1.31*  CALCIUM 8.1*  --   --  8.9    PT/INR:   Recent Labs  07/08/15 1355  LABPROT 19.9*  INR 1.69*   Radiology: Dg Chest Port 1 View  07/09/2015  CLINICAL DATA:  Chest tube removal EXAM: PORTABLE CHEST 1 VIEW COMPARISON:  07/09/2015 FINDINGS: Right IJ central line sheath remains in place after removal of Swan-Ganz catheter. A left-sided chest tube is in place. Inferior approach central  line or mediastinal drain has been removed. There is minimal subsegmental atelectasis at the left lung base. No pneumothorax. Radiopaque material overlies the central upper chest. Status post median sternotomy and CABG. Shallow inflation. No edema. IMPRESSION: Interval removal of Swan-Ganz catheter an inferior approach central line versus mediastinal drain. No pneumothorax. Electronically Signed   By: Nolon Nations M.D.   On: 07/09/2015 15:51   Chest xray today, " the  Unknown shadows" is heating pad.   Assessment/Plan: S/P Procedure(s) (LRB): CORONARY ARTERY BYPASS GRAFTING (CABG)x three using left internal mammary artery to left anterior decending artery, left greater saphenous vein grafts to diagonal and posterolateral. Left leg greater saphenous leg vein via endoscope.  (N/A) TRANSESOPHAGEAL ECHOCARDIOGRAM (TEE) (N/A) CYSTOSCOPY FLEXIBLE WITH BALLOON DILATION OF BLADDER NECK CONTRACTURE WITH DIFFICULT CATHETER PLACEMENT (N/A) Mobilize Diuresis Diabetes control d/c tubes/lines Continue foley due to bladder outlet obstruction Plan for transfer to step-down: see transfer orders     Clifford [name] 07/10/2015 8:01 AM

## 2015-07-11 ENCOUNTER — Other Ambulatory Visit: Payer: Self-pay | Admitting: Internal Medicine

## 2015-07-11 ENCOUNTER — Encounter (HOSPITAL_COMMUNITY): Payer: Self-pay | Admitting: Cardiothoracic Surgery

## 2015-07-11 ENCOUNTER — Inpatient Hospital Stay (HOSPITAL_COMMUNITY): Payer: 59

## 2015-07-11 DIAGNOSIS — Z951 Presence of aortocoronary bypass graft: Secondary | ICD-10-CM

## 2015-07-11 LAB — POCT I-STAT 3, ART BLOOD GAS (G3+)
Acid-base deficit: 2 mmol/L (ref 0.0–2.0)
Bicarbonate: 22.6 mEq/L (ref 20.0–24.0)
O2 Saturation: 98 %
PCO2 ART: 34.9 mmHg — AB (ref 35.0–45.0)
PH ART: 7.416 (ref 7.350–7.450)
PO2 ART: 96 mmHg (ref 80.0–100.0)
Patient temperature: 36.3
TCO2: 24 mmol/L (ref 0–100)

## 2015-07-11 LAB — CBC
HCT: 31.3 % — ABNORMAL LOW (ref 39.0–52.0)
Hemoglobin: 10.7 g/dL — ABNORMAL LOW (ref 13.0–17.0)
MCH: 32.3 pg (ref 26.0–34.0)
MCHC: 34.2 g/dL (ref 30.0–36.0)
MCV: 94.6 fL (ref 78.0–100.0)
Platelets: 245 10*3/uL (ref 150–400)
RBC: 3.31 MIL/uL — ABNORMAL LOW (ref 4.22–5.81)
RDW: 14 % (ref 11.5–15.5)
WBC: 17.4 10*3/uL — ABNORMAL HIGH (ref 4.0–10.5)

## 2015-07-11 LAB — GLUCOSE, CAPILLARY
Glucose-Capillary: 131 mg/dL — ABNORMAL HIGH (ref 65–99)
Glucose-Capillary: 126 mg/dL — ABNORMAL HIGH (ref 65–99)
Glucose-Capillary: 94 mg/dL (ref 65–99)

## 2015-07-11 LAB — BASIC METABOLIC PANEL
Anion gap: 11 (ref 5–15)
BUN: 28 mg/dL — ABNORMAL HIGH (ref 6–20)
CO2: 21 mmol/L — ABNORMAL LOW (ref 22–32)
Calcium: 9.1 mg/dL (ref 8.9–10.3)
Chloride: 100 mmol/L — ABNORMAL LOW (ref 101–111)
Creatinine, Ser: 1.43 mg/dL — ABNORMAL HIGH (ref 0.61–1.24)
GFR calc Af Amer: 55 mL/min — ABNORMAL LOW (ref >60)
GFR calc non Af Amer: 47 mL/min — ABNORMAL LOW (ref >60)
Glucose, Bld: 194 mg/dL — ABNORMAL HIGH (ref 65–99)
Potassium: 4.1 mmol/L (ref 3.5–5.1)
Sodium: 132 mmol/L — ABNORMAL LOW (ref 135–145)

## 2015-07-11 LAB — POCT I-STAT 4, (NA,K, GLUC, HGB,HCT)
GLUCOSE: 116 mg/dL — AB (ref 65–99)
HEMATOCRIT: 29 % — AB (ref 39.0–52.0)
HEMOGLOBIN: 9.9 g/dL — AB (ref 13.0–17.0)
Potassium: 3.6 mmol/L (ref 3.5–5.1)
SODIUM: 138 mmol/L (ref 135–145)

## 2015-07-11 MED ORDER — FUROSEMIDE 10 MG/ML IJ SOLN
40.0000 mg | Freq: Once | INTRAMUSCULAR | Status: AC
  Administered 2015-07-11: 40 mg via INTRAVENOUS
  Filled 2015-07-11: qty 4

## 2015-07-11 MED ORDER — LACTULOSE 10 GM/15ML PO SOLN: 20.0000 g | Freq: Every day | ORAL | Status: DC | PRN

## 2015-07-11 MED ORDER — METOPROLOL TARTRATE 25 MG PO TABS
25.0000 mg | ORAL_TABLET | Freq: Two times a day (BID) | ORAL | Status: DC
  Administered 2015-07-11 (×2): 25 mg via ORAL
  Filled 2015-07-11 (×3): qty 1

## 2015-07-11 MED ORDER — POLYETHYLENE GLYCOL 3350 17 G PO PACK
17.0000 g | PACK | Freq: Every day | ORAL | Status: DC
  Administered 2015-07-11 – 2015-07-14 (×4): 17 g via ORAL
  Filled 2015-07-11 (×4): qty 1

## 2015-07-11 MED ORDER — LIVING WELL WITH DIABETES BOOK
Freq: Once | Status: AC
  Administered 2015-07-11: 12:00:00
  Filled 2015-07-11: qty 1

## 2015-07-11 NOTE — Progress Notes (Signed)
Utilization review completed.  

## 2015-07-11 NOTE — Progress Notes (Addendum)
[address]       [place] [address]             [phone]      3 Days Post-Op Procedure(s) (LRB): CORONARY ARTERY BYPASS GRAFTING (CABG)x three using left internal mammary artery to left anterior decending artery, left greater saphenous vein grafts to diagonal and posterolateral. Left leg greater saphenous leg vein via endoscope.  (N/A) TRANSESOPHAGEAL ECHOCARDIOGRAM (TEE) (N/A) CYSTOSCOPY FLEXIBLE WITH BALLOON DILATION OF BLADDER NECK CONTRACTURE WITH DIFFICULT CATHETER PLACEMENT (N/A)   Subjective:  Clifford [name] complains of scrotal swelling.  He otherwise states he notices he feels a little better each day.  He is ambulating without much difficulty.  No BM, no flatus  Objective: Vital signs in last 24 hours: Temp:  [98.5 F (36.9 C)-98.7 F (37.1 C)] 98.7 F (37.1 C) (05/15 0407) Pulse Rate:  [98-129] 125 (05/15 0407) Cardiac Rhythm:  [-] Sinus tachycardia (05/15 0745) Resp:  [22-27] 22 (05/15 0407) BP: (121-144)/(58-87) 144/74 mmHg (05/15 0407) SpO2:  [90 %-96 %] 96 % (05/15 0407) Weight:  [85.231 kg (187 lb 14.4 oz)] 85.231 kg (187 lb 14.4 oz) (05/15 0407)  Intake/Output from previous day: 05/14 0701 - 05/15 0700 In: 390 [P.O.:360; I.V.:30] Out: 560 [Urine:550; Chest Tube:10]  General appearance: alert, cooperative and no distress Heart: regular rate and rhythm and tachy Lungs: clear to auscultation bilaterally Abdomen: firm, distended, no BS appreciated Extremities: significant scrotal edema per patient, extensive ecchymosis Right groin with extension into scrotum, BLE LE edema Wound: clean and dry  Lab Results:  Recent Labs  07/10/15 0445 07/11/15 0850  WBC 19.4* 17.4*  HGB 9.9* 10.7*  HCT 29.8* 31.3*  PLT 141* 245   BMET:   Recent Labs  07/10/15 0445 07/11/15 0850  NA 136 132*  K 4.9 4.1  CL 103 100*  CO2 21* 21*  GLUCOSE 188* 194*  BUN 20 28*  CREATININE 1.31* 1.43*  CALCIUM 8.9 9.1    PT/INR:   Recent Labs  07/08/15 1355  LABPROT 19.9*  INR 1.69*   ABG    Component Value Date/Time   PHART 7.343* 07/08/2015 2006   HCO3 20.8 07/08/2015 2006   TCO2 19 07/09/2015 1813   ACIDBASEDEF 4.0* 07/08/2015 2006   O2SAT 93.0 07/08/2015 2006   CBG (last 3)   Recent Labs  07/10/15 1252 07/10/15 1720 07/10/15 2154  GLUCAP 194* 147* 142*    Assessment/Plan: S/P Procedure(s) (LRB): CORONARY ARTERY BYPASS GRAFTING (CABG)x three using left internal mammary artery to left anterior decending artery, left greater saphenous vein grafts to diagonal and posterolateral. Left leg greater saphenous leg vein via endoscope.  (N/A) TRANSESOPHAGEAL ECHOCARDIOGRAM (TEE) (N/A) CYSTOSCOPY FLEXIBLE WITH BALLOON DILATION OF BLADDER NECK CONTRACTURE WITH DIFFICULT CATHETER PLACEMENT (N/A)  1. CV- Sinus Tach, HTN- will increase Lopressor to 25 mg BID... Will hold off on ACE for now until creatinine improves 2. Pulm- wean oxygen as tolerated, CXR with atelectasis/infiltrates, continue IS 3. Renal- creatinine mildly elevated at 1.31, + hypervolemia, weight is up 19 lbs... Will give IV lasix this morning 4. GU- bladder outlet obstruction... Leave foley in place for now 5. Expected post operative blood loss anemia- stable Hgb is 9.9 6. GI - Last BM 5/6... Patient without GI complaints,  + Bowel distention, will start Miralax, Lactulose prn... But need to watch closely for development of Ileus 7. DM- prior to surgery patient diet controlled, will likely need Metformin at discharge, can start once creatinine improves  8. Dispo- patient stable, increase Lopressor, IV Lasix for hypervolemia, bowel prep, continue current care    LOS: 9 days    [name] 07/11/2015  Cr 1.4 but not indication of AKI base line 1.23  Leave foley into with recent bladder neck dilation and getting lasix this am, plan to remove in am I have seen and examined Clifford [name] and agree with the above assessment  and plan.  [name] MD Beeper [phone] Office [phone] 07/11/2015 11:49 AM

## 2015-07-11 NOTE — Progress Notes (Signed)
CARDIAC REHAB PHASE I   PRE:  Rate/Rhythm: 106 ST  BP:  Supine:   Sitting: 99/60  Standing:    SaO2: prongs out of nose  96%  MODE:  Ambulation: 150 ft   POST:  Rate/Rhythm: 119 ST  BP:  Supine:   Sitting: 110/65  Standing:    SaO2: 91%RA 1115-1140 Pt had oxygen on but prongs not in nose so I walked on RA. Left oxygen off after walk but left tubing within reach in case he gets SOB. Pt held to pillow and rocked when getting up. Walked 150 ft on RA with rolling walker and asst x1. Encouraged to stay close to walker. Very motivated to walk. Will try to increase distance. Pt did not want to go farther this walk. To recliner with call bell.   Graylon Good, RN BSN  07/11/2015 11:37 AM

## 2015-07-11 NOTE — Op Note (Signed)
Clifford [name], DIPONIO NO.:  [id number]  MEDICAL RECORD NO.:  [id number]  LOCATION:  2W22C                        FACILITY:  [facility]  PHYSICIAN:  Lanelle Bal, MD    DATE OF BIRTH:  1945/10/18  DATE OF PROCEDURE:  07/08/2015 DATE OF DISCHARGE:                              OPERATIVE REPORT   PREOPERATIVE DIAGNOSIS:  Coronary occlusive disease with unstable angina.  POSTOPERATIVE DIAGNOSIS:  Coronary occlusive disease with unstable angina.  SURGICAL PROCEDURE:  Coronary artery bypass grafting x3 with left internal mammary to the left anterior descending coronary artery, reverse saphenous vein graft to the diagonal coronary artery, reverse saphenous vein graft to the posterolateral branch of the right coronary artery, and placement of left femoral arterial line with ultrasound guidance,  and left thigh greater saphenous endo vein harvesting.  SURGEON:  Lanelle Bal, M.D.  FIRST ASSISTANT:  Providence Crosby, PA.  BRIEF HISTORY:  The patient is a 70 year old male with known previous coronary artery disease having had a stent placed in his circumflex coronary artery many years before.  He presented with new onset of anginal chest pain, unstable in pattern and was admitted.  He was loaded with Plavix and then underwent cardiac catheterization, which demonstrated greater than 90% complex diagonal LAD lesion and high-grade stenosis of a long dominant posterolateral branch of the right coronary artery.  Overall ventricular function was preserved.  Preoperatively, the patient had been cathed from the right arm and right leg with significant hematomas in both locations making placement of arterial line difficult.  Initial P2Y12 testing revealed a very low level of platelet activity, so we delayed surgery several days for Plavix washout.  Risks and options of surgery were discussed with the patient in detail and he was agreeable and willing to proceed.  DESCRIPTION  OF PROCEDURE:  With Swan-Ganz and arterial line monitors in place, the patient underwent general endotracheal anesthesia without incidence.  The skin of the chest and legs was prepped with Betadine and draped in usual sterile manner.  Prior to prepping and draping, the left groin was prepped in a sterile manner.  SonoSite ultrasound was used to locate the left femoral artery and a direct puncture of the artery and placement of arterial line was carried out.  This was due to the fact of the hematoma in the right groin and right arm and Anesthesia was unable to place an A line in either the brachial arteries or in the left radial artery.  With the A line in place, we were unable to place a Foley the day preoperatively and Urology had been called suggesting that this may be a problem because of the patient's previous prostate surgery.  Dr. Lilia Pro arrived and was able to dilate the patient's bladder neck contracture and placed the Foley catheter.  We then proceeded with prepping and draping in the usual standard fashion using the appropriate time-out was performed, and using a [employer] vein Harvesting System, the vein was harvested from the left thigh.  Median sternotomy was performed.  Left internal mammary artery was dissected down as a pedicle graft.  The distal artery was divided and had good free flow. Pericardium was opened.  Overall, ventricular function appeared preserved.  The patient was systemically heparinized.  Ascending aorta was cannulated.  The right atrium was cannulated.  An aortic root vent cardioplegia needle was introduced into the ascending aorta.  The patient was placed on cardiopulmonary bypass at 2.4 L/min/m2.  Sites of anastomoses were selected and dissected out of the epicardium.  The patient's body temperature was cooled to 32 degrees.  Aortic cross-clamp was applied.  A 500 mL cold blood potassium cardioplegia was administered with diastolic arrest of the  heart.  Myocardial septal temperature was monitored throughout the cross-clamp.  Attention was turned first to the posterolateral branch of the right coronary artery, which was a relatively small vessel but was opened, admitted a 1.5 mm probe distally.  Using a running 7-0 Prolene, distal anastomosis was performed.  Attention was then turned to the diagonal coronary artery which was opened and admitted a 1-mm probe distally but not proximally. Using a running 7-0 Prolene, distal anastomosis of a segment of reverse saphenous vein graft was carried out.  The distal LAD was opened and using a running 8-0 Prolene, left internal mammary artery was anastomosed to the left anterior descending coronary artery.  With release of the bulldog on the mammary artery, there was rise in myocardial septal temperature.  The bulldog was placed back on the mammary artery, and with the cross-clamp still in place, two punch aortotomies were performed in each of the two vein grafts and anastomosed to the ascending aorta with running 6-0 Prolene.  The bulldog was removed from the mammary artery with rise in myocardial septal temperature.  Heart was allowed to passively fill and de-air. The proximal anastomoses were completed and the aortic clot cross-clamp was removed with total cross-clamp time of 74 minutes.  The patient spontaneously converted to a sinus rhythm.  Sites of anastomoses were inspected and were free of bleeding.  The patient was then rewarmed to 37 degrees.  He was then ventilated and weaned from cardiopulmonary bypass without difficulty.  He remained hemodynamically stable.  He was decannulated in usual fashion.  Protamine sulfate was administered. With the operative field hemostatic, atrial and ventricular pacing wires were applied.  The left pleural tube and a Blake mediastinal drain were left in place.  Pericardium was loosely reapproximated.  Sternum was closed with #6 stainless steel wire.   Fascia was closed with interrupted 0 Vicryl, running 3-0 Vicryl in subcutaneous tissue, 4-0 subcuticular stitch in skin edges.  Dry dressings were applied.  The patient tolerated the procedure without obvious complication and was transferred to the Surgical Intensive Care Unit for further postoperative care.  He did not require any blood bank blood products during the operative procedure.  Sponge and needle count was reported as correct at the completion of the procedure.     Lanelle Bal, MD     EG/MEDQ  D:  07/11/2015  T:  07/11/2015  Job:  [id number]

## 2015-07-11 NOTE — Progress Notes (Signed)
Patient ID: Clifford [name], male   DOB: Sep 02, 1945, 70 y.o.   MRN: [id number]    Subjective:  Denies SSCP, palpitations or Dyspnea Bladder sore slow progress   Objective:  Filed Vitals:   07/10/15 1400 07/10/15 2047 07/10/15 2203 07/11/15 0407  BP: 121/58 137/71 144/86 144/74  Pulse: 122 129 122 125  Temp:  98.5 F (36.9 C)  98.7 F (37.1 C)  TempSrc:  Oral  Oral  Resp: 27 23  22   Height:      Weight:    85.231 kg (187 lb 14.4 oz)  SpO2: 90% 93%  96%    Intake/Output from previous day:  Intake/Output Summary (Last 24 hours) at 07/11/15 [id number] Last data filed at 07/11/15 0409  Gross per 24 hour  Intake    370 ml  Output    450 ml  Net    -80 ml    Physical Exam: Affect appropriate Healthy:  appears stated age HEENT: normal Neck supple with no adenopathy JVP normal no bruits no thyromegaly Lungs clear with no wheezing and good diaphragmatic motion Heart:  S1/S2 no murmur, no rub, gallop or click Sternotomy  PMI normal Abdomen: benighn, BS positve, no tenderness, no AAA no bruit.  No HSM or HJR Distal pulses intact with no bruits Plus one  Edema  Foley  Neuro non-focal Skin warm and dry No muscular weakness   Lab Results: Basic Metabolic Panel:  Recent Labs  07/09/15 0427 07/09/15 1813 07/09/15 1820 07/10/15 0445  NA 138 138  --  136  K 4.5 4.2  --  4.9  CL 109 103  --  103  CO2 20*  --   --  21*  GLUCOSE 112* 181*  --  188*  BUN 14 13  --  20  CREATININE 1.27* 0.90 1.18 1.31*  CALCIUM 8.1*  --   --  8.9  MG 2.4  --  2.3  --    CBC:  Recent Labs  07/09/15 0427 07/09/15 1813 07/10/15 0445  WBC 12.6*  --  19.4*  HGB 9.0* 10.5* 9.9*  HCT 26.4* 31.0* 29.8*  MCV 93.3  --  95.5  PLT 121*  --  141*    Imaging: Dg Chest 2 View  07/11/2015  CLINICAL DATA:  Hypertension. EXAM: CHEST  2 VIEW COMPARISON:  07/10/2015 . FINDINGS: Interim removal of right IJ sheath . Mediastinum and hilar structures normal. Prior CABG. Low lung volumes with bibasilar  atelectasis and/or infiltrates again noted. Small bilateral pleural effusions . IMPRESSION: 1.  Interim removal of right IJ sheath . 2.  Patchy bibasilar atelectasis and/or infiltrates again noted. Electronically Signed   By: Marcello Moores  Register   On: 07/11/2015 07:24   Dg Chest Port 1 View  07/10/2015  CLINICAL DATA:  Chest tube in place EXAM: PORTABLE CHEST 1 VIEW COMPARISON:  07/09/2015 FINDINGS: Cardiomediastinal silhouette is stable. Status post CABG. Right IJ sheath is unchanged in position. Stable left chest tube position. No infiltrate or pulmonary edema. Persistent left basilar atelectasis. There is no pneumothorax. IMPRESSION: No infiltrate or pulmonary edema. Left chest tube is unchanged in position. Persistent left basilar atelectasis. Status post CABG. No pneumothorax. Electronically Signed   By: Lahoma Crocker M.D.   On: 07/10/2015 09:25   Dg Chest Port 1 View  07/09/2015  CLINICAL DATA:  Chest tube removal EXAM: PORTABLE CHEST 1 VIEW COMPARISON:  07/09/2015 FINDINGS: Right IJ central line sheath remains in place after removal of Swan-Ganz catheter. A left-sided chest tube  is in place. Inferior approach central line or mediastinal drain has been removed. There is minimal subsegmental atelectasis at the left lung base. No pneumothorax. Radiopaque material overlies the central upper chest. Status post median sternotomy and CABG. Shallow inflation. No edema. IMPRESSION: Interval removal of Swan-Ganz catheter an inferior approach central line versus mediastinal drain. No pneumothorax. Electronically Signed   By: Nolon Nations M.D.   On: 07/09/2015 15:51    Cardiac Studies:  ECG:    Telemetry:  NSR no arrhythmia   Echo: EF 55-60% reviewed   Medications:   . antiseptic oral rinse  7 mL Mouth Rinse BID  . aspirin EC  325 mg Oral Daily  . docusate sodium  200 mg Oral Daily  . enoxaparin (LOVENOX) injection  40 mg Subcutaneous Q24H  . ezetimibe-simvastatin  1 tablet Oral Daily  . furosemide   40 mg Intravenous Once  . insulin aspart  0-24 Units Subcutaneous TID AC & HS  . insulin detemir  20 Units Subcutaneous Daily  . metoprolol tartrate  25 mg Oral BID  . pantoprazole  40 mg Oral QAC breakfast  . polyethylene glycol  17 g Oral Daily  . sodium chloride flush  3 mL Intravenous Q12H  . traMADol  50 mg Oral Q6H  . vitamin C  250 mg Oral Daily       Assessment/Plan:  CABG:  Post CABG for 3VD normal EF slow progress.  Had bladder retention and foley for 5 weeks 10 years Ago after prostate surgery. Constipated. Volume overloaded.  Continue laxative, diuresis and foley for now Plan per CVTS.  If Cr stays elevated amaryl may be alternative for elevated BS  Jenkins Rouge 07/11/2015, 8:23 AM

## 2015-07-11 NOTE — Progress Notes (Signed)
Inpatient Diabetes Program Recommendations  AACE/ADA: New Consensus Statement on Inpatient Glycemic Control (2015)  Target Ranges:  Prepandial:   less than 140 mg/dL      Peak postprandial:   less than 180 mg/dL (1-2 hours)      Critically ill patients:  140 - 180 mg/dL  Results for YANZIEL, BRAUNSTEIN (MRN [id number]) as of 07/11/2015 11:32  Ref. Range 01/17/2015 11:30 07/07/2015 03:31  Hemoglobin A1C Latest Ref Range: 4.8-5.6 % 7.4 (H) 7.9 (H)   Review of Glycemic Control  Diabetes history: DM Type 2 Outpatient Diabetes medications: None Current orders for Inpatient glycemic control: Levemir 20 units q hs + Novolog correction 0-24 units tid  Inpatient Diabetes Program Recommendations:  Noted patient was not on DM home meds prior to hospitalization. Requested book Living Well, dietician consult, with Diabetes and diabetes videos when appropriate.  Thank you, Nani Gasser. Ranae Casebier, RN, MSN, CDE Inpatient Glycemic Control Team Team Pager [phone] (8am-5pm) 07/11/2015 11:37 AM

## 2015-07-12 LAB — GLUCOSE, CAPILLARY
GLUCOSE-CAPILLARY: 82 mg/dL (ref 65–99)
GLUCOSE-CAPILLARY: 110 mg/dL — AB (ref 65–99)
GLUCOSE-CAPILLARY: 101 mg/dL — AB (ref 65–99)
Glucose-Capillary: 101 mg/dL — ABNORMAL HIGH (ref 65–99)

## 2015-07-12 LAB — BASIC METABOLIC PANEL
ANION GAP: 14 (ref 5–15)
BUN: 39 mg/dL — ABNORMAL HIGH (ref 6–20)
CHLORIDE: 99 mmol/L — AB (ref 101–111)
CO2: 21 mmol/L — ABNORMAL LOW (ref 22–32)
CREATININE: 1.6 mg/dL — AB (ref 0.61–1.24)
Calcium: 8.7 mg/dL — ABNORMAL LOW (ref 8.9–10.3)
GFR calc non Af Amer: 41 mL/min — ABNORMAL LOW (ref >60)
GFR, EST AFRICAN AMERICAN: 48 mL/min — AB (ref >60)
Glucose, Bld: 86 mg/dL (ref 65–99)
Potassium: 3.5 mmol/L (ref 3.5–5.1)
SODIUM: 134 mmol/L — AB (ref 135–145)

## 2015-07-12 MED ORDER — METOPROLOL TARTRATE 25 MG PO TABS
37.5000 mg | ORAL_TABLET | Freq: Two times a day (BID) | ORAL | Status: DC
  Administered 2015-07-12 – 2015-07-14 (×5): 37.5 mg via ORAL
  Filled 2015-07-12 (×5): qty 1

## 2015-07-12 MED FILL — Lidocaine HCl IV Inj 20 MG/ML: INTRAVENOUS | Qty: 5 | Status: AC

## 2015-07-12 MED FILL — Heparin Sodium (Porcine) Inj 1000 Unit/ML: INTRAMUSCULAR | Qty: 10 | Status: AC

## 2015-07-12 MED FILL — Sodium Chloride IV Soln 0.9%: INTRAVENOUS | Qty: 2000 | Status: AC

## 2015-07-12 MED FILL — Electrolyte-R (PH 7.4) Solution: INTRAVENOUS | Qty: 5000 | Status: AC

## 2015-07-12 MED FILL — Mannitol IV Soln 20%: INTRAVENOUS | Qty: 500 | Status: AC

## 2015-07-12 MED FILL — Sodium Bicarbonate IV Soln 8.4%: INTRAVENOUS | Qty: 50 | Status: AC

## 2015-07-12 NOTE — Plan of Care (Signed)
Problem: Food- and Nutrition-Related Knowledge Deficit (NB-1.1) Goal: Nutrition education Formal process to instruct or train a patient/client in a skill or to impart knowledge to help patients/clients voluntarily manage or modify food choices and eating behavior to maintain or improve health. Outcome: Completed/Met Date Met:  07/12/15  RD consulted for nutrition education regarding diabetes >> spoke with patient's wife.    Lab Results  Component Value Date    HGBA1C 7.9* 07/07/2015    RD provided "Carbohydrate Counting for People with Diabetes" handout from the Academy of Nutrition and Dietetics. Discussed different food groups and their effects on blood sugar, emphasizing carbohydrate-containing foods. Provided list of carbohydrates and recommended serving sizes of common foods.  Discussed importance of controlled and consistent carbohydrate intake throughout the day. Provided examples of ways to balance meals/snacks and encouraged intake of high-fiber, whole grain complex carbohydrates. Teach back method used.  Expect good compliance.  Body mass index is 28.85 kg/(m^2). Pt meets criteria for Overweight based on current BMI.  Current diet order is Heart Healthy/Carbohydrate Modified, patient is consuming approximately 80% of meals at this time. Labs and medications reviewed. No further nutrition interventions warranted at this time. If additional nutrition issues arise, please re-consult RD.  Arthur Holms, RD, LDN Pager #: [phone] After-Hours Pager #: [phone]

## 2015-07-12 NOTE — Progress Notes (Signed)
Pt ambulated 550 ft in hall with walker, stopped once to rest.  Stood up and sat down without assistance.  Bladder scanned, per order, after walking.  81cc noted in bladder.  Pt continues to be incontinent of urine, but states that he is starting to notice when he needs to engage pelvic muscles to control urine flow.

## 2015-07-12 NOTE — Progress Notes (Signed)
CARDIAC REHAB PHASE I   PRE:  Rate/Rhythm: 100 ST    BP: sitting 121/70    SaO2: 95 RA  MODE:  Ambulation: 550 ft   POST:  Rate/Rhythm: 115 ST    BP: sitting 112/60     SaO2: 90 RA  Pt needed help to get to edge of chair. Able to stand and walk with min assist and RW. Rest x2 for fatigue. Return to recliner. Incontinent of urine after catheter was pulled recently. Elevated feet and scrotum.  [place], ACSM 07/12/2015 11:17 AM

## 2015-07-12 NOTE — Plan of Care (Signed)
Problem: Activity: Goal: Risk for activity intolerance will decrease Outcome: Progressing Pt is tolerating ambulation  In the hallway well

## 2015-07-12 NOTE — Progress Notes (Signed)
Pt experiencing urinary incontinence s/p foley removal at 12:00.  Condom cath not appropriate at this time due to the amount of swelling in genital region.  Mesh underwear and pads applied.

## 2015-07-12 NOTE — Plan of Care (Signed)
Problem: Bowel/Gastric: Goal: Will not experience complications related to bowel motility Outcome: Not Progressing Pt still unable to produce stool

## 2015-07-12 NOTE — Progress Notes (Addendum)
      [name]       [name] [address]             [phone]      4 Days Post-Op Procedure(s) (LRB): CORONARY ARTERY BYPASS GRAFTING (CABG)x three using left internal mammary artery to left anterior decending artery, left greater saphenous vein grafts to diagonal and posterolateral. Left leg greater saphenous leg vein via endoscope.  (N/A) TRANSESOPHAGEAL ECHOCARDIOGRAM (TEE) (N/A) CYSTOSCOPY FLEXIBLE WITH BALLOON DILATION OF BLADDER NECK CONTRACTURE WITH DIFFICULT CATHETER PLACEMENT (N/A)   Subjective:  [name] has no new complaints.  He thinks he is swelling improved some after lasix yesterday.  + ambulation  No BM, + flatus  Objective: Vital signs in last 24 hours: Temp:  [97.5 F (36.4 C)-98.2 F (36.8 C)] 97.5 F (36.4 C) (05/16 0500) Pulse Rate:  [107-116] 107 (05/16 0500) Cardiac Rhythm:  [-] Sinus tachycardia (05/15 2100) Resp:  [20] 20 (05/15 2017) BP: (114-124)/(62-99) 124/72 mmHg (05/16 0500) SpO2:  [93 %-100 %] 93 % (05/16 0500) Weight:  [189 lb 11.2 oz (86.047 kg)] 189 lb 11.2 oz (86.047 kg) (05/16 0500)  Intake/Output from previous day: 05/15 0701 - 05/16 0700 In: 720 [P.O.:720] Out: 1800 [Urine:1800]  General appearance: alert, cooperative and no distress Heart: regular rate and rhythm and tachy Lungs: clear to auscultation bilaterally Abdomen: soft, non-tender; bowel sounds normal; no masses,  no organomegaly Extremities: edema trace Wound: clean and dry  Lab Results:  Recent Labs  07/10/15 0445 07/11/15 0850  WBC 19.4* 17.4*  HGB 9.9* 10.7*  HCT 29.8* 31.3*  PLT 141* 245   BMET:  Recent Labs  07/11/15 0850 07/12/15 0411  NA 132* 134*  K 4.1 3.5  CL 100* 99*  CO2 21* 21*  GLUCOSE 194* 86  BUN 28* 39*  CREATININE 1.43* 1.60*  CALCIUM 9.1 8.7*    PT/INR: No results for input(s): LABPROT, INR in the last 72 hours. ABG    Component Value Date/Time   PHART 7.343* 07/08/2015 2006   HCO3 20.8 07/08/2015 2006     TCO2 19 07/09/2015 1813   ACIDBASEDEF 4.0* 07/08/2015 2006   O2SAT 93.0 07/08/2015 2006   CBG (last 3)   Recent Labs  07/11/15 1618 07/11/15 2309 07/12/15 0624  GLUCAP 126* 94 82    Assessment/Plan: S/P Procedure(s) (LRB): CORONARY ARTERY BYPASS GRAFTING (CABG)x three using left internal mammary artery to left anterior decending artery, left greater saphenous vein grafts to diagonal and posterolateral. Left leg greater saphenous leg vein via endoscope.  (N/A) TRANSESOPHAGEAL ECHOCARDIOGRAM (TEE) (N/A) CYSTOSCOPY FLEXIBLE WITH BALLOON DILATION OF BLADDER NECK CONTRACTURE WITH DIFFICULT CATHETER PLACEMENT (N/A)  1. CV- Sinus Tach- increase lopressor to 37.5 mg BID, hold ACE due to elevated creatinine 2. Pulm- no acute issues, continue IS 3. Renal- creatinine at 1.60, remains hypervolemic- will hold diuretics today, repeat BMET in AM 4. GU- d/c foley 5. GI- now passing, gas no BM yet, no N/V continue bowel prep 6. Dispo- patient stable, watch creatinine, increase Lopressor continue current care   LOS: 10 days    Clifford [name] 07/12/2015  Foley out , check bladder scan to make sure is emptying bladder  I have seen and examined Clifford [name] and agree with the above assessment  and plan.  [name] MD Beeper [phone] Office [phone] 07/12/2015 12:17 PM

## 2015-07-13 LAB — CBC
HCT: 25.1 % — ABNORMAL LOW (ref 39.0–52.0)
Hemoglobin: 8.5 g/dL — ABNORMAL LOW (ref 13.0–17.0)
MCH: 31.6 pg (ref 26.0–34.0)
MCHC: 33.9 g/dL (ref 30.0–36.0)
MCV: 93.3 fL (ref 78.0–100.0)
Platelets: 307 10*3/uL (ref 150–400)
RBC: 2.69 MIL/uL — ABNORMAL LOW (ref 4.22–5.81)
RDW: 14.1 % (ref 11.5–15.5)
WBC: 8.2 10*3/uL (ref 4.0–10.5)

## 2015-07-13 LAB — BASIC METABOLIC PANEL
Anion gap: 11 (ref 5–15)
BUN: 35 mg/dL — AB (ref 6–20)
CO2: 22 mmol/L (ref 22–32)
Calcium: 8.4 mg/dL — ABNORMAL LOW (ref 8.9–10.3)
Chloride: 101 mmol/L (ref 101–111)
Creatinine, Ser: 1.46 mg/dL — ABNORMAL HIGH (ref 0.61–1.24)
GFR calc Af Amer: 54 mL/min — ABNORMAL LOW (ref >60)
GFR, EST NON AFRICAN AMERICAN: 46 mL/min — AB (ref >60)
GLUCOSE: 110 mg/dL — AB (ref 65–99)
POTASSIUM: 3.7 mmol/L (ref 3.5–5.1)
Sodium: 134 mmol/L — ABNORMAL LOW (ref 135–145)

## 2015-07-13 LAB — GLUCOSE, CAPILLARY
GLUCOSE-CAPILLARY: 139 mg/dL — AB (ref 65–99)
Glucose-Capillary: 115 mg/dL — ABNORMAL HIGH (ref 65–99)
Glucose-Capillary: 122 mg/dL — ABNORMAL HIGH (ref 65–99)
Glucose-Capillary: 115 mg/dL — ABNORMAL HIGH (ref 65–99)

## 2015-07-13 MED ORDER — FUROSEMIDE 10 MG/ML IJ SOLN
40.0000 mg | Freq: Once | INTRAMUSCULAR | Status: AC
  Administered 2015-07-13: 40 mg via INTRAVENOUS
  Filled 2015-07-13: qty 4

## 2015-07-13 NOTE — Progress Notes (Addendum)
      [address]       [place] [address]             [phone]      5 Days Post-Op Procedure(s) (LRB): CORONARY ARTERY BYPASS GRAFTING (CABG)x three using left internal mammary artery to left anterior decending artery, left greater saphenous vein grafts to diagonal and posterolateral. Left leg greater saphenous leg vein via endoscope.  (N/A) TRANSESOPHAGEAL ECHOCARDIOGRAM (TEE) (N/A) CYSTOSCOPY FLEXIBLE WITH BALLOON DILATION OF BLADDER NECK CONTRACTURE WITH DIFFICULT CATHETER PLACEMENT (N/A)   Subjective:  [name] has no complaints.  He is feeling pretty good.    Objective: Vital signs in last 24 hours: Temp:  [99.3 F (37.4 C)-99.9 F (37.7 C)] 99.4 F (37.4 C) (05/17 0453) Pulse Rate:  [102-118] 102 (05/17 0429) Cardiac Rhythm:  [-] Sinus tachycardia (05/16 2000) Resp:  [19-20] 19 (05/17 0429) BP: (113-135)/(62-80) 115/70 mmHg (05/17 0429) SpO2:  [94 %-100 %] 94 % (05/17 0429) Weight:  [188 lb 9.6 oz (85.548 kg)] 188 lb 9.6 oz (85.548 kg) (05/17 0429)  Intake/Output from previous day: 05/16 0701 - 05/17 0700 In: 1088 [P.O.:1088] Out: 350 [Urine:350] Intake/Output this shift: Total I/O In: 240 [P.O.:240] Out: -   General appearance: alert, cooperative and no distress Heart: regular rate and rhythm Lungs: clear to auscultation bilaterally Abdomen: soft, non-tender; bowel sounds normal; no masses,  no organomegaly Extremities: edema 1-2 pitting edema Wound: clean and dry  Lab Results:  Recent Labs  07/11/15 0850 07/13/15 0317  WBC 17.4* 8.2  HGB 10.7* 8.5*  HCT 31.3* 25.1*  PLT 245 307   BMET:  Recent Labs  07/12/15 0411 07/13/15 0317  NA 134* 134*  K 3.5 3.7  CL 99* 101  CO2 21* 22  GLUCOSE 86 110*  BUN 39* 35*  CREATININE 1.60* 1.46*  CALCIUM 8.7* 8.4*    PT/INR: No results for input(s): LABPROT, INR in the last 72 hours. ABG    Component Value Date/Time   PHART 7.343* 07/08/2015 2006   HCO3 20.8 07/08/2015 2006   TCO2 19 07/09/2015 1813   ACIDBASEDEF 4.0* 07/08/2015 2006   O2SAT 93.0 07/08/2015 2006   CBG (last 3)   Recent Labs  07/12/15 1617 07/12/15 2121 07/13/15 0703  GLUCAP 101* 101* 139*    Assessment/Plan: S/P Procedure(s) (LRB): CORONARY ARTERY BYPASS GRAFTING (CABG)x three using left internal mammary artery to left anterior decending artery, left greater saphenous vein grafts to diagonal and posterolateral. Left leg greater saphenous leg vein via endoscope.  (N/A) TRANSESOPHAGEAL ECHOCARDIOGRAM (TEE) (N/A) CYSTOSCOPY FLEXIBLE WITH BALLOON DILATION OF BLADDER NECK CONTRACTURE WITH DIFFICULT CATHETER PLACEMENT (N/A)  1. CV- hemodynamically stable- remains stable on Lopressor 2. Pulm- no acute issues, continue 3. Renal- creatinine trending at 1.46, will repeat Lasix today, add TED hose for edema 4. DM- patient doesn't check sugars, will need monitor at discharge and close follow up 5. Dispo- patient stable, d/c EPW,  Maybe home in AM   LOS: 11 days    Clifford [name] 07/13/2015  Continue diuresis with lower extremity edema No bladder residual but has some incontinence Home one to two days  I have seen and examined Clifford [name] and agree with the above assessment  and plan.  Clifford Isaac MD Beeper [phone] Office [phone] 07/13/2015 1:59 PM

## 2015-07-13 NOTE — Discharge Summary (Signed)
Physician Discharge Summary  Patient ID: Clifford [name] MRN: [id number] DOB/AGE: 72-Mar-1947 72 y.o.  Admit date: 07/02/2015 Discharge date: 07/14/2015  Admission Diagnoses:  Patient Active Problem List   Diagnosis Date Noted  . Hyperlipidemia 07/03/2015  . Type II diabetes mellitus ([place]) 07/03/2015  . Hypertensive heart disease 07/03/2015  . Unstable angina ([place]) 07/02/2015  . Personal history of colonic polyps-adenomas 11/23/2011  . Routine health maintenance 12/11/2010  . Diet-controlled type 2 diabetes mellitus ([place]) 07/23/2007  . HYPERCHOLESTEROLEMIA 03/07/2007  . Essential hypertension 03/07/2007  . Coronary atherosclerosis 03/07/2007  . PROSTATE CANCER, HX OF 03/07/2007  . HEMATURIA, HX OF 03/07/2007  . EXCISION OF GANGLION CYST, WRIST, HX OF 03/07/2007   Discharge Diagnoses:   Patient Active Problem List   Diagnosis Date Noted  . 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 Hyperlipidemia 07/03/2015  . Type II diabetes mellitus ([place]) 07/03/2015  . Hypertensive heart disease 07/03/2015  . Unstable angina ([place]) 07/02/2015  . Personal history of colonic polyps-adenomas 11/23/2011  . Routine health maintenance 12/11/2010  . Diet-controlled type 2 diabetes mellitus ([place]) 07/23/2007  . HYPERCHOLESTEROLEMIA 03/07/2007  . Essential hypertension 03/07/2007  . Coronary atherosclerosis 03/07/2007  . PROSTATE CANCER, HX OF 03/07/2007  . HEMATURIA, HX OF 03/07/2007  . EXCISION OF GANGLION CYST, WRIST, HX OF 03/07/2007   Discharged Condition: good  History of Present Illness:  Clifford [name] is a 70 yo white male with known history of DM diet controlled, HTN, Hyperlipidemia, H/O nicotine abuse, Prostate cancer S/P Prostatectomy over 10 years ago, and CAD. His CAD started in 1995 at which time had underwent catheterization with angioplasty. He had further issues again in 2000 and 2002 with stent placement to the Left Circumflex artery. Since that time the patient has done well. However on Friday  07/01/2015 the patient developed mid sternal chest pain after eating. He thought this was indigestion and gas. He took anti-gas medication with little relief. He states this occurred off and on throughout the evening. On Saturday the pain persisted and was worse and he call EMS for transportation to the ED. He was ruled out for MI, but continued to have chest pain. He was admitted and treated with NTG and Heparin drips. It was felt with patient's previous cardiac history cardiac catheterization would be indicated. This was done on 07/04/2015 and revealed 3V CAD and Coronary bypass grafting would be indicated and TCTS consult was obtained.    Hospital Course:   He was evaluated by Dr. Servando Snare who was in agreement the patient would require Coronary artery bypass grafting procedure.  The risks and benefits of the procedure were explained to the patient and he was agreeable to proceed.  He was given Plavix on admission and would require wash out prior to proceeding with surgery.  He was taken to the operating room on 07/08/2015.  He underwent Cytoscopy with balloon dilation of bladder neck contracture with foley catheter placement.  He also underwent CABG x 3 utilizing LIMA to LAD, SVG to PLVB, and SVG to Diagonal.  He also underwent endoscopic harvest of the greater saphenous vein from his left leg.  He tolerated the procedure without difficulty and was taken to the SICU in stable condition.  He was extubated the evening of surgery.  During his stay in the SICU the patient's chest tubes and arterial lines were removed without difficulty.  He was maintaining NSR.  He was ambulating around the SICU without difficulty.  He was felt medically stable for transfer to  the step down unit on POD #2.  The patient continued to progress slowly.  He was tachycardic and his Lopressor was titrated accordingly as tolerated.  He was hypervolemic with significant scrotal edema and was treated with diuretics.  His creatinine was  elevated with a peak level of 1.61.  He was not started on an ACE Inhibitor due to this.  This slowly improved during his admission and his most recent level is 1.34.  He is a diabetic, however he does not check his sugars at home and states he is diet controlled.  His preoperative A1c was 7.4.  Diabetes education was consulted and instructed the patient on diligent management of his diabetes.  He will be given a prescription for glucose testing supplies.  He will require follow up with his PCP for further treatment of his diabetes.  His foley catheter was removed on POD # 4.  He is voiding without difficulty.  He continues to maintain NSR and his pacing wires have been removed without difficulty.  He remains medically stable.  He is ambulating independently.  He is felt medically stable for discharge home today.    Significant Diagnostic Studies: angiography:    Ost Cx lesion, 50% stenosed.  Mid LAD lesion, 95% stenosed. Severe, diffuse disease in the mid to distal LAD which is all heavily calcified. The origin of a moderate-sized diagonal is also compromised.  Prox LAD to Mid LAD lesion, 50% stenosed.  RPDA lesion, 80% stenosed.  The left ventricular systolic function is normal.  Normal LVEDP.  Treatments: surgery:   Coronary artery bypass grafting x3 with left internal mammary to the left anterior descending coronary artery, reverse saphenous vein graft to the diagonal coronary artery, reverse saphenous vein graft to the posterolateral branch of the right coronary artery, and placement of left femoral arterial line with ultrasound guidance, and left thigh greater saphenous endo vein harvesting.  . Cystoscopy 2. Balloon dilation of bladder neck stenosis 3. Difficult urethral catheter placement  Disposition: Home  Discharge Medications:  The patient has been discharged on:   1.Beta Blocker:  Yes [ x  ]                              No   [   ]                              If No,  reason:  2.Ace Inhibitor/ARB: Yes [   ]                                     No  [ x   ]                                     If No, reason: Elevated creatinine, labile BP  3.Statin:   Yes [ x  ]                  No  [   ]                  If No, reason:  4.Ecasa:  Yes  [  x ]  No   [   ]                  If No, reason:        Medication List    STOP taking these medications        lisinopril 10 MG tablet  Commonly known as:  PRINIVIL,ZESTRIL     mupirocin cream 2 %  Commonly known as:  BACTROBAN      TAKE these medications        Alcohol Wipes Pads  Use prior to each blood glucose check     aspirin EC 81 MG tablet  Take 324 mg by mouth once.     blood glucose meter kit and supplies  Dispense based on patient and insurance preference. Use up to four times daily as directed. (FOR ICD-9 250.00, 250.01).     furosemide 40 MG tablet  Commonly known as:  LASIX  Take 1 tablet (40 mg total) by mouth daily. For 7 Days     guaiFENesin 600 MG 12 hr tablet  Commonly known as:  MUCINEX  Take 1 tablet (600 mg total) by mouth every 12 (twelve) hours as needed for cough.     Metoprolol Tartrate 37.5 MG Tabs  Take 37.5 mg by mouth 2 (two) times daily.     multivitamin tablet  Take 1 tablet by mouth daily.     oxyCODONE 5 MG immediate release tablet  Commonly known as:  Oxy IR/ROXICODONE  Take 1 tablet (5 mg total) by mouth every 4 (four) hours as needed for severe pain.     polyethylene glycol packet  Commonly known as:  MIRALAX / GLYCOLAX  Take 17 g by mouth daily as needed for moderate constipation.     Potassium Chloride ER 20 MEQ Tbcr  Take 20 mEq by mouth daily. For 7 Days     VITAMIN C PO  Take 1 tablet by mouth daily.     VYTORIN 10-80 MG tablet  Generic drug:  ezetimibe-simvastatin  Take 1 tablet by mouth  daily       Follow-up Information    Follow up with [name], MD On 08/11/2015.   Specialty:  Cardiothoracic Surgery    Why:  Appointment is at 9:30   Contact information:   [place] [address] [phone]       Follow up with [facility] On 08/11/2015.   Why:  Please get CXR at 9:00 prior to your appointment with Dr. Evaristo Bury information:   [facility]       Follow up with Hoyt Koch, MD.   Specialty:  Internal Medicine   Why:  Please set up follow up in near future for management of diabetes   Contact information:   [place] [address] [phone]       Follow up with [facility].   Why:  please contact office to set up follow up, post difficult catheter placement   Contact information:   [place] [phone]      Follow up with Richardson Dopp, PA-C On 08/01/2015.   Specialties:  Cardiology, Physician Assistant   Why:  Appointment is at 9:15   Contact information:   [address] [place] [address] [phone]       Signed: Ellwood Handler 07/14/2015, 8:06 AM

## 2015-07-13 NOTE — Progress Notes (Signed)
CARDIAC REHAB PHASE I   PRE:  Rate/Rhythm: 109 ST  BP:  Supine:   Sitting: 156/84  Standing:    SaO2: 96%RA  MODE:  Ambulation: 690 ft   POST:  Rate/Rhythm: 128 ST  BP:  Supine:   Sitting: 139/71  Standing:    SaO2: 96%RA [id number] Pt walked 690 ft on RA with rolling walker and minimal asst. Does not need walker for home use as he has one he can use. To bathroom and then to chair.   Graylon Good, RN BSN  07/13/2015 9:51 AM

## 2015-07-13 NOTE — Progress Notes (Signed)
Spoke with patient about his elevated HgbA1C of 7.9%.  Stated that his PCP had told him of an elevated A1C about 2 years ago. He has been watching his diet and walking "5 miles" before he had heart problems.  Has not been checking blood sugars at home. Needs home blood glucose meter prescription. Suggested that he check blood sugars twice a day before meals. Take copy of results to PCP at next appointment. Has Living Well with Diabetes booklet, discussed importance of keeping Hgba1C at 7.5% or less, importance of good blood sugar control.  Has watched 3 DM videos.   At discharge, will need prescription for home blood glucose meter, strips, and lancets  Order # [id number].   Will continue to follow while in the hospital. Harvel Ricks RN BSN CDE

## 2015-07-13 NOTE — Progress Notes (Signed)
Patient ID: Clifford [name], male   DOB: 1945-06-21, 70 y.o.   MRN: [id number]    Subjective:  Better bladder control ambulating with rehab   Objective:  Filed Vitals:   07/12/15 2200 07/13/15 0429 07/13/15 0453 07/13/15 0941  BP: 135/80 115/70  139/71  Pulse: 110 102  110  Temp:   99.4 F (37.4 C)   TempSrc:   Oral   Resp:  19    Height:      Weight:  85.548 kg (188 lb 9.6 oz)    SpO2:  94%      Intake/Output from previous day:  Intake/Output Summary (Last 24 hours) at 07/13/15 0955 Last data filed at 07/13/15 0830  Gross per 24 hour  Intake   1088 ml  Output    350 ml  Net    738 ml    Physical Exam: Affect appropriate Healthy:  appears stated age HEENT: normal Neck supple with no adenopathy JVP normal no bruits no thyromegaly Lungs clear with no wheezing and good diaphragmatic motion Heart:  S1/S2 no murmur, no rub, gallop or click Sternotomy  PMI normal Abdomen: benighn, BS positve, no tenderness, no AAA no bruit.  No HSM or HJR Distal pulses intact with no bruits Plus one  Edema  Foley  Neuro non-focal Skin warm and dry No muscular weakness   Lab Results: Basic Metabolic Panel:  Recent Labs  07/12/15 0411 07/13/15 0317  NA 134* 134*  K 3.5 3.7  CL 99* 101  CO2 21* 22  GLUCOSE 86 110*  BUN 39* 35*  CREATININE 1.60* 1.46*  CALCIUM 8.7* 8.4*   CBC:  Recent Labs  07/11/15 0850 07/13/15 0317  WBC 17.4* 8.2  HGB 10.7* 8.5*  HCT 31.3* 25.1*  MCV 94.6 93.3  PLT 245 307    Imaging: No results found.  Cardiac Studies:  ECG:    Telemetry:  NSR no arrhythmia 07/13/2015   Echo: EF 55-60% reviewed   Medications:   . antiseptic oral rinse  7 mL Mouth Rinse BID  . aspirin EC  325 mg Oral Daily  . docusate sodium  200 mg Oral Daily  . enoxaparin (LOVENOX) injection  40 mg Subcutaneous Q24H  . ezetimibe-simvastatin  1 tablet Oral Daily  . insulin aspart  0-24 Units Subcutaneous TID AC & HS  . insulin detemir  20 Units Subcutaneous Daily    . metoprolol tartrate  37.5 mg Oral BID  . pantoprazole  40 mg Oral QAC breakfast  . polyethylene glycol  17 g Oral Daily  . sodium chloride flush  3 mL Intravenous Q12H  . traMADol  50 mg Oral Q6H  . vitamin C  250 mg Oral Daily       Assessment/Plan:  CABG:  Post CABG for 3VD normal EF slow progress.  Had bladder retention and foley for 5 weeks 10 years Ago after prostate surgery. Bladder scan only 80 cc improving   Not clear why HR high on good dose of metoprolol May need outpatient echo first post op visit  Plan per CVTS.  If Cr stays elevated amaryl may be alternative for elevated BS  Jenkins Rouge 07/13/2015, 9:55 AM

## 2015-07-13 NOTE — Progress Notes (Signed)
Pacer wires were pulled with wires intact. Patient instructed to stay on bedrest for 1 hour. Vital signs will be taken q15 minutes and will continue to monitor.

## 2015-07-13 NOTE — Discharge Instructions (Signed)
Coronary Artery Bypass Grafting, Care After °Refer to this sheet in the next few weeks. These instructions provide you with information on caring for yourself after your procedure. Your health care provider may also give you more specific instructions. Your treatment has been planned according to current medical practices, but problems sometimes occur. Call your health care provider if you have any problems or questions after your procedure. °WHAT TO EXPECT AFTER THE PROCEDURE °Recovery from surgery will be different for everyone. Some people feel well after 3 or 4 weeks, while for others it takes longer. After your procedure, it is typical to have the following: °· Nausea and a lack of appetite.   °· Constipation. °· Weakness and fatigue.   °· Depression or irritability.   °· Pain or discomfort at your incision site. °HOME CARE INSTRUCTIONS °· Take medicines only as directed by your health care provider. Do not stop taking medicines or start any new medicines without first checking with your health care provider. °· Take your pulse as directed by your health care provider. °· Perform deep breathing as directed by your health care provider. If you were given a device called an incentive spirometer, use it to practice deep breathing several times a day. Support your chest with a pillow or your arms when you take deep breaths or cough. °· Keep incision areas clean, dry, and protected. Remove or change any bandages (dressings) only as directed by your health care provider. You may have skin adhesive strips over the incision areas. Do not take the strips off. They will fall off on their own. °· Check incision areas daily for any swelling, redness, or drainage. °· If incisions were made in your legs, do the following: °¨ Avoid crossing your legs.   °¨ Avoid sitting for long periods of time. Change positions every 30 minutes.   °¨ Elevate your legs when you are sitting. °· Wear compression stockings as directed by your  health care provider. These stockings help keep blood clots from forming in your legs. °· Take showers once your health care provider approves. Until then, only take sponge baths. Pat incisions dry. Do not rub incisions with a washcloth or towel. Do not take baths, swim, or use a hot tub until your health care provider approves. °· Eat foods that are high in fiber, such as raw fruits and vegetables, whole grains, beans, and nuts. Meats should be lean cut. Avoid canned, processed, and fried foods. °· Drink enough fluid to keep your urine clear or pale yellow. °· Weigh yourself every day. This helps identify if you are retaining fluid that may make your heart and lungs work harder. °· Rest and limit activity as directed by your health care provider. You may be instructed to: °¨ Stop any activity at once if you have chest pain, shortness of breath, irregular heartbeats, or dizziness. Get help right away if you have any of these symptoms. °¨ Move around frequently for short periods or take short walks as directed by your health care provider. Increase your activities gradually. You may need physical therapy or cardiac rehabilitation to help strengthen your muscles and build your endurance. °¨ Avoid lifting, pushing, or pulling anything heavier than 10 lb (4.5 kg) for at least 6 weeks after surgery. °· Do not drive until your health care provider approves.  °· Ask your health care provider when you may return to work. °· Ask your health care provider when you may resume sexual activity. °· Keep all follow-up visits as directed by your health care   provider. This is important. °SEEK MEDICAL CARE IF: °· You have swelling, redness, increasing pain, or drainage at the site of an incision. °· You have a fever. °· You have swelling in your ankles or legs. °· You have pain in your legs.   °· You gain 2 or more pounds (0.9 kg) a day. °· You are nauseous or vomit. °· You have diarrhea.  °SEEK IMMEDIATE MEDICAL CARE IF: °· You have  chest pain that goes to your jaw or arms. °· You have shortness of breath.   °· You have a fast or irregular heartbeat.   °· You notice a "clicking" in your breastbone (sternum) when you move.   °· You have numbness or weakness in your arms or legs. °· You feel dizzy or light-headed.   °MAKE SURE YOU: °· Understand these instructions. °· Will watch your condition. °· Will get help right away if you are not doing well or get worse. °  °This information is not intended to replace advice given to you by your health care provider. Make sure you discuss any questions you have with your health care provider. °  °Document Released: 09/01/2004 Document Revised: 03/05/2014 Document Reviewed: 07/22/2012 °Elsevier Interactive Patient Education ©2016 [employer] ° °Endoscopic Saphenous Vein Harvesting, Care After °Refer to this sheet in the next few weeks. These instructions provide you with information on caring for yourself after your procedure. Your health care provider may also give you more specific instructions. Your treatment has been planned according to current medical practices, but problems sometimes occur. Call your health care provider if you have any problems or questions after your procedure. °HOME CARE INSTRUCTIONS °Medicine °· Take whatever pain medicine your surgeon prescribes. Follow the directions carefully. Do not take over-the-counter pain medicine unless your surgeon says it is okay. Some pain medicine can cause bleeding problems for several weeks after surgery. °· Follow your surgeon's instructions about driving. You will probably not be permitted to drive after heart surgery. °· Take any medicines your surgeon prescribes. Any medicines you took before your heart surgery should be checked with your health care provider before you start taking them again. °Wound care °· If your surgeon has prescribed an elastic bandage or stocking, ask how long you should wear it. °· Check the area around your surgical  cuts (incisions) whenever your bandages (dressings) are changed. Look for any redness or swelling. °· You will need to return to have the stitches (sutures) or staples taken out. Ask your surgeon when to do that. °· Ask your surgeon when you can shower or bathe. °Activity °· Try to keep your legs raised when you are sitting. °· Do any exercises your health care providers have given you. These may include deep breathing exercises, coughing, walking, or other exercises. °SEEK MEDICAL CARE IF: °· You have any questions about your medicines. °· You have more leg pain, especially if your pain medicine stops working. °· New or growing bruises develop on your leg. °· Your leg swells, feels tight, or becomes red. °· You have numbness in your leg. °SEEK IMMEDIATE MEDICAL CARE IF: °· Your pain gets much worse. °· Blood or fluid leaks from any of the incisions. °· Your incisions become warm, swollen, or red. °· You have chest pain. °· You have trouble breathing. °· You have a fever. °· You have more pain near your leg incision. °MAKE SURE YOU: °· Understand these instructions. °· Will watch your condition. °· Will get help right away if you are not doing well or   get worse. °  °This information is not intended to replace advice given to you by your health care provider. Make sure you discuss any questions you have with your health care provider. °  °Document Released: 10/25/2010 Document Revised: 03/05/2014 Document Reviewed: 10/25/2010 °Elsevier Interactive Patient Education ©2016 [employer] Inc[employer] ° ° °

## 2015-07-14 LAB — BASIC METABOLIC PANEL WITH GFR
Anion gap: 9 (ref 5–15)
BUN: 30 mg/dL — ABNORMAL HIGH (ref 6–20)
CO2: 25 mmol/L (ref 22–32)
Calcium: 8.4 mg/dL — ABNORMAL LOW (ref 8.9–10.3)
Chloride: 99 mmol/L — ABNORMAL LOW (ref 101–111)
Creatinine, Ser: 1.34 mg/dL — ABNORMAL HIGH (ref 0.61–1.24)
GFR calc Af Amer: 59 mL/min — ABNORMAL LOW
GFR calc non Af Amer: 51 mL/min — ABNORMAL LOW
Glucose, Bld: 113 mg/dL — ABNORMAL HIGH (ref 65–99)
Potassium: 3.7 mmol/L (ref 3.5–5.1)
Sodium: 133 mmol/L — ABNORMAL LOW (ref 135–145)

## 2015-07-14 LAB — GLUCOSE, CAPILLARY
GLUCOSE-CAPILLARY: 142 mg/dL — AB (ref 65–99)
Glucose-Capillary: 121 mg/dL — ABNORMAL HIGH (ref 65–99)

## 2015-07-14 MED ORDER — POLYETHYLENE GLYCOL 3350 17 G PO PACK: 17.0000 g | PACK | Freq: Every day | ORAL | Status: DC | PRN

## 2015-07-14 MED ORDER — BLOOD GLUCOSE METER KIT: PACK | Status: DC

## 2015-07-14 MED ORDER — ALCOHOL WIPES PADS: MEDICATED_PAD | Status: DC

## 2015-07-14 MED ORDER — GUAIFENESIN ER 600 MG PO TB12: 600.0000 mg | ORAL_TABLET | Freq: Two times a day (BID) | ORAL | Status: DC | PRN

## 2015-07-14 MED ORDER — METOPROLOL TARTRATE 37.5 MG PO TABS: 37.5000 mg | ORAL_TABLET | Freq: Two times a day (BID) | ORAL | Status: DC

## 2015-07-14 MED ORDER — POTASSIUM CHLORIDE ER 20 MEQ PO TBCR: 20.0000 meq | EXTENDED_RELEASE_TABLET | Freq: Every day | ORAL | Status: DC

## 2015-07-14 MED ORDER — FUROSEMIDE 10 MG/ML IJ SOLN
40.0000 mg | Freq: Once | INTRAMUSCULAR | Status: AC
  Administered 2015-07-14: 40 mg via INTRAVENOUS
  Filled 2015-07-14: qty 4

## 2015-07-14 MED ORDER — FUROSEMIDE 40 MG PO TABS
40.0000 mg | ORAL_TABLET | Freq: Every day | ORAL | Status: DC
Start: 2015-07-14 — End: 2015-08-01

## 2015-07-14 MED ORDER — OXYCODONE HCL 5 MG PO TABS: 5.0000 mg | ORAL_TABLET | ORAL | Status: DC | PRN

## 2015-07-14 NOTE — Progress Notes (Signed)
Patient ID: Clifford [name], male   DOB: [birth year], 70 y.o.   MRN: [id number]    Subjective:  Plan d/c today   Objective:  Filed Vitals:   07/13/15 1200 07/13/15 1400 07/13/15 2203 07/14/15 0331  BP: 117/67 114/61 113/65 109/58  Pulse: 96 93 92 88  Temp:  98.5 F (36.9 C) 98.3 F (36.8 C) 98.7 F (37.1 C)  TempSrc:  Oral Oral Oral  Resp:  20 18 18   Height:      Weight:    84.1 kg (185 lb 6.5 oz)  SpO2:  96% 97% 96%    Intake/Output from previous day:  Intake/Output Summary (Last 24 hours) at 07/14/15 1028 Last data filed at 07/14/15 0856  Gross per 24 hour  Intake    720 ml  Output    901 ml  Net   -181 ml    Physical Exam: Affect appropriate Healthy:  appears stated age HEENT: normal Neck supple with no adenopathy JVP normal no bruits no thyromegaly Lungs clear with no wheezing and good diaphragmatic motion Heart:  S1/S2 no murmur, no rub, gallop or click Sternotomy  PMI normal Abdomen: benighn, BS positve, no tenderness, no AAA no bruit.  No HSM or HJR Distal pulses intact with no bruits Plus one  Edema  Foley  Neuro non-focal Skin warm and dry No muscular weakness   Lab Results: Basic Metabolic Panel:  Recent Labs  07/13/15 0317 07/14/15 0318  NA 134* 133*  K 3.7 3.7  CL 101 99*  CO2 22 25  GLUCOSE 110* 113*  BUN 35* 30*  CREATININE 1.46* 1.34*  CALCIUM 8.4* 8.4*   CBC:  Recent Labs  07/13/15 0317  WBC 8.2  HGB 8.5*  HCT 25.1*  MCV 93.3  PLT 307    Imaging: No results found.  Cardiac Studies:  ECG:  NSR no acute ST changes old IMI    Telemetry:  NSR no arrhythmia 07/14/2015   Echo: EF 55-60% reviewed   Medications:   . antiseptic oral rinse  7 mL Mouth Rinse BID  . aspirin EC  325 mg Oral Daily  . docusate sodium  200 mg Oral Daily  . enoxaparin (LOVENOX) injection  40 mg Subcutaneous Q24H  . ezetimibe-simvastatin  1 tablet Oral Daily  . furosemide  40 mg Intravenous Once  . insulin aspart  0-24 Units Subcutaneous TID  AC & HS  . insulin detemir  20 Units Subcutaneous Daily  . metoprolol tartrate  37.5 mg Oral BID  . pantoprazole  40 mg Oral QAC breakfast  . polyethylene glycol  17 g Oral Daily  . sodium chloride flush  3 mL Intravenous Q12H  . traMADol  50 mg Oral Q6H  . vitamin C  250 mg Oral Daily       Assessment/Plan:  CABG:  Post CABG for 3VD normal EF HR better this am on fairly high dose metoprolol hopefully can titrate Down as outpatient. Did well ambulating with rehab Urinary retention and frequency better post previous prostate surgery f/u urology.  Probably ok to resume glucophage for BS  Jenkins Rouge 07/14/2015, 10:28 AM

## 2015-07-14 NOTE — Progress Notes (Signed)
[id number] Education completed with pt and wife who voiced understanding. Encouraged IS. Discussed CRP 2 and referring to [place] pt diabetic and heart healthy diets and discussed carb counting. Wrote down how to view discharge video.  Graylon Good RN BSN 07/14/2015 9:56 AM

## 2015-07-14 NOTE — Progress Notes (Addendum)
      [address]       [place] [address]             [phone]      6 Days Post-Op Procedure(s) (LRB): CORONARY ARTERY BYPASS GRAFTING (CABG)x three using left internal mammary artery to left anterior decending artery, left greater saphenous vein grafts to diagonal and posterolateral. Left leg greater saphenous leg vein via endoscope.  (N/A) TRANSESOPHAGEAL ECHOCARDIOGRAM (TEE) (N/A) CYSTOSCOPY FLEXIBLE WITH BALLOON DILATION OF BLADDER NECK CONTRACTURE WITH DIFFICULT CATHETER PLACEMENT (N/A)   Subjective:  Mr. Growney is feeling great.  He is ambulating without difficulty.  + BM  Objective: Vital signs in last 24 hours: Temp:  [98.3 F (36.8 C)-98.7 F (37.1 C)] 98.7 F (37.1 C) (05/18 0331) Pulse Rate:  [86-110] 88 (05/18 0331) Cardiac Rhythm:  [-] Normal sinus rhythm (05/17 2100) Resp:  [18-20] 18 (05/18 0331) BP: (102-140)/(58-83) 109/58 mmHg (05/18 0331) SpO2:  [96 %-97 %] 96 % (05/18 0331) Weight:  [185 lb 6.5 oz (84.1 kg)] 185 lb 6.5 oz (84.1 kg) (05/18 0331)  Intake/Output from previous day: 05/17 0701 - 05/18 0700 In: 720 [P.O.:720] Out: 701 [Urine:700; Stool:1]  General appearance: alert, cooperative and no distress Heart: regular rate and rhythm Lungs: clear to auscultation bilaterally Abdomen: soft, non-tender; bowel sounds normal; no masses,  no organomegaly Extremities: edema improving, 1+ ecchymosis bilaterally Wound: clean and dry  Lab Results:  Recent Labs  07/11/15 0850 07/13/15 0317  WBC 17.4* 8.2  HGB 10.7* 8.5*  HCT 31.3* 25.1*  PLT 245 307   BMET:  Recent Labs  07/13/15 0317 07/14/15 0318  NA 134* 133*  K 3.7 3.7  CL 101 99*  CO2 22 25  GLUCOSE 110* 113*  BUN 35* 30*  CREATININE 1.46* 1.34*  CALCIUM 8.4* 8.4*    PT/INR: No results for input(s): LABPROT, INR in the last 72 hours. ABG    Component Value Date/Time   PHART 7.343* 07/08/2015 2006   HCO3 20.8 07/08/2015 2006   TCO2 19 07/09/2015 1813   ACIDBASEDEF 4.0* 07/08/2015 2006   O2SAT 93.0 07/08/2015 2006   CBG (last 3)   Recent Labs  07/13/15 1637 07/13/15 2157 07/14/15 0630  GLUCAP 122* 115* 121*    Assessment/Plan: S/P Procedure(s) (LRB): CORONARY ARTERY BYPASS GRAFTING (CABG)x three using left internal mammary artery to left anterior decending artery, left greater saphenous vein grafts to diagonal and posterolateral. Left leg greater saphenous leg vein via endoscope.  (N/A) TRANSESOPHAGEAL ECHOCARDIOGRAM (TEE) (N/A) CYSTOSCOPY FLEXIBLE WITH BALLOON DILATION OF BLADDER NECK CONTRACTURE WITH DIFFICULT CATHETER PLACEMENT (N/A)  1. CV- hemodynamically stable, maintaining NSR HR is improved- continue Lopressor 2. Pulm- no acute issues, continue IS 3. Renal- creatinine at 1.34, will continue Lasix 4. GI- constipation resolved 5. DM- patient educated, will order glucose supplies, follow up with PCP 6. Dispo- patient stable, HR improved, continue diuretics at discharge, f/u with Urology and PCP... Plan to d/c home today   LOS: 12 days    Ellwood Handler 07/14/2015  Plan home today I have seen and examined [name] and agree with the above assessment  and plan.  [name] MD Beeper [phone] Office [phone] 07/14/2015 9:16 AM

## 2015-07-14 NOTE — Care Management Note (Signed)
Case Management Note Marvetta Gibbons RN, BSN Unit 2W-Case Manager [phone]  Patient Details  Name: Clifford [name] MRN: [id number] Date of Birth: 02-08-1946  Subjective/Objective:     Pt admitted with [place] found MVD s/p CABG                Action/Plan: PTA pt lived at home with family- plan to return home- no anticipated needs  Expected Discharge Date:  07/14/15               Expected Discharge Plan:  Home/Self Care  In-House Referral:     Discharge planning Services  CM Consult  Post Acute Care Choice:    Choice offered to:     DME Arranged:    DME Agency:     HH Arranged:    [place] Agency:     Status of Service:  Completed, signed off  Medicare Important Message Given:    Date Medicare IM Given:    Medicare IM give by:    Date Additional Medicare IM Given:    Additional Medicare Important Message give by:     If discussed at [place] of Stay Meetings, dates discussed:    Additional Comments:  Dawayne Patricia, RN 07/14/2015, 10:34 AM

## 2015-07-14 NOTE — Progress Notes (Signed)
Reviewed discharge instructions with patient and family. No issues at present. IV removed. Awaiting discharge.  Kijuana Ruppel, Mervin Kung RN

## 2015-07-15 ENCOUNTER — Telehealth: Payer: Self-pay | Admitting: *Deleted

## 2015-07-15 NOTE — Telephone Encounter (Signed)
Pt was on TCM list admitted for HTN, Unstable angina. He had significant diagnostic studies done: angiography, CABG x's, catheter will be f/u w/cardiologist & urologist.../lmb

## 2015-07-31 NOTE — Progress Notes (Signed)
Cardiology Office Note:    Date:  08/01/2015   ID:  Clifford [name], DOB 1945-08-05, MRN [id number]  PCP:  Hoyt Koch, MD  Cardiologist:  Dr. Jenkins Rouge   Electrophysiologist:  n/a  Referring MD: Hoyt Koch, *   Chief Complaint  Patient presents with  . Hospitalization Follow-up    s/p CABG    History of Present Illness:     Clifford [name] is a 71 y.o. male with a hx of CAD status post prior PCI in 1995, 2000 and 2002, diabetes, HTN, HL, prostate CA status post prostatectomy. Last seen by Dr. Johnsie Cancel 10/12.  Admitted 5/6-5/18 with unstable angina. Cardiac enzymes remained negative. Cardiac catheterization demonstrated 3 vessel CAD and CABG was recommended. He was seen by Dr. Servando Snare and underwent CABG with LIMA-LAD, SVG-PLVB, SVG-DX. Postoperative course was notable for volume excess as well as worsening creatinine. Creatinine improved prior to discharge. He was placed on ACE inhibitor. He remained in NSR.  Returns for follow-up.  Here with his wife. Overall, he is doing well. He notes some issues after his cardiac catheterization with his right groin. I reviewed the hospital notes. It appears that he had a  small right groin pseudoaneurysm. There was a significant amount of swelling and compression could not be applied. He went off to bypass the same day. He notes a lot of numbness and tingling along his right thigh. Denies right groin pain. Chest is not sore. He denies significant breathing issues. He's walking about 4 miles a day. Denies orthopnea, PND. Left leg remains swollen. Denies fevers. Denies coughing or wheezing.   Past Medical History  Diagnosis Date  . Hyperlipidemia   . Hypertension   . History of detached retina repair 11-06-2006    [facility]  . CAD (coronary artery disease) 2005    a. Previous stents RCA and circ, Cath 2005 patient with 70% PDA  //  b. [place] 5/17 >> LHC oLCx 50, pLAD 50, mLAD 95, RPDA 80, normal LVEF >> s/p CABG  . History  of prostate cancer 03/2005    s/p prostatectomy  . History of hematuria   . Cataract     replace 11/2012  . Diabetes mellitus     diet controlled  . Carotid artery disease ([place])     a. Carotid US 5/17 bilat ICA 1-39%  . History of echocardiogram     a. EF 55% to 60%. Wall motion was normal, Gr 1 DD, Trivial MR/TR    Past Surgical History  Procedure Laterality Date  . Prostatectomy  03/2005  . Ptca  2005  . Lipoma excision    . Wrist ganglion excision    . Hemorrhoid surgery      I&D of thrombosed hemorrhoid  . Cardiac catheterization  05.31.2002    Initially the stenosis in the proximal to mid right coronary artery was estimated ar 95%. Following stenting, this improved to 0%. There was residual mild disease in the sostium of the proximal right coronary atrtery. Successful stentng of the proximal to mid right coronary artery with improvement in stent diameter narrowing from 95% to 0% Proc Date  07/29/2000  . Colonoscopy    . Eye surgery  11/24/2012    left eye cataract  . Eye surgery  12/22/2012    right eye cataract  . Cataract extraction w/ intraocular lens implant Bilateral OS Sept '14; OD Oct '14    Dr. Bing Plume  . Angioplasty      1995,2000,2002  .  Cardiac catheterization N/A 07/04/2015    Procedure: Left Heart Cath and Coronary Angiography;  Surgeon: Jayadeep S Varanasi, MD;  Location: [facility] INVASIVE CV LAB;  Service: Cardiovascular;  Laterality: N/A;  . Coronary artery bypass graft N/A 07/08/2015    Procedure: CORONARY ARTERY BYPASS GRAFTING (CABG)x three using left internal mammary artery to left anterior decending artery, left greater saphenous vein grafts to diagonal and posterolateral. Left leg greater saphenous leg vein via endoscope. ;  Surgeon: Edward B Gerhardt, MD;  Location: [facility] OR;  Service: Open Heart Surgery;  Laterality: N/A;  . Tee without cardioversion N/A 07/08/2015    Procedure: TRANSESOPHAGEAL ECHOCARDIOGRAM (TEE);  Surgeon: Edward B Gerhardt, MD;  Location: [facility] OR;   Service: Open Heart Surgery;  Laterality: N/A;  . Cystoscopy N/A 07/08/2015    Procedure: CYSTOSCOPY FLEXIBLE WITH BALLOON DILATION OF BLADDER NECK CONTRACTURE WITH DIFFICULT CATHETER PLACEMENT;  Surgeon: Lester Borden, MD;  Location: [facility] OR;  Service: Urology;  Laterality: N/A;    Current Medications: Outpatient Prescriptions Prior to Visit  Medication Sig Dispense Refill  . Alcohol Swabs (ALCOHOL WIPES) PADS Use prior to each blood glucose check 100 each 3  . Ascorbic Acid (VITAMIN C PO) Take 1 tablet by mouth daily.    . aspirin EC 81 MG tablet Take 81 mg by mouth once.     . blood glucose meter kit and supplies Dispense based on patient and insurance preference. Use up to four times daily as directed. (FOR ICD-9 250.00, 250.01). 1 each 0  . Metoprolol Tartrate 37.5 MG TABS Take 37.5 mg by mouth 2 (two) times daily. 60 tablet 3  . Multiple Vitamin (MULTIVITAMIN) tablet Take 1 tablet by mouth daily.      . VYTORIN 10-80 MG tablet Take 1 tablet by mouth  daily 90 tablet 1  . furosemide (LASIX) 40 MG tablet Take 1 tablet (40 mg total) by mouth daily. For 7 Days (Patient not taking: Reported on 08/01/2015) 7 tablet 0  . guaiFENesin (MUCINEX) 600 MG 12 hr tablet Take 1 tablet (600 mg total) by mouth every 12 (twelve) hours as needed for cough. (Patient not taking: Reported on 08/01/2015)    . oxyCODONE (OXY IR/ROXICODONE) 5 MG immediate release tablet Take 1 tablet (5 mg total) by mouth every 4 (four) hours as needed for severe pain. (Patient not taking: Reported on 08/01/2015) 30 tablet 0  . polyethylene glycol (MIRALAX / GLYCOLAX) packet Take 17 g by mouth daily as needed for moderate constipation. (Patient not taking: Reported on 08/01/2015) 14 each 0  . potassium chloride 20 MEQ TBCR Take 20 mEq by mouth daily. For 7 Days (Patient not taking: Reported on 08/01/2015) 7 tablet 0   No facility-administered medications prior to visit.      Allergies:   Cinnamon   Social History   Social History  .  Marital Status: Married    Spouse Name: N/A  . Number of Children: N/A  . Years of Education: N/A   Social History Main Topics  . Smoking status: Former Smoker    Quit date: 02/27/1983  . Smokeless tobacco: Never Used  . Alcohol Use: No  . Drug Use: No  . Sexual Activity:    Partners: Female   Other Topics Concern  . None   Social History Narrative   High School Graduate, married in 1964 - 17 years divorced; 79 - 4 years divorced; 94.    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 ACP - discussed with patient and provided packet and   referral to [email or web address] Nov '15. He already has stated that if he is :"comatose" pull the plug.      Family History:  The patient's family history includes CAD in his father; Colon polyps in his sister; Diabetes in his sister. There is no history of Cancer, Colon cancer, Esophageal cancer, Rectal cancer, or Stomach cancer.   ROS:   Please see the history of present illness.    Review of Systems  Cardiovascular: Positive for chest pain and leg swelling.  Gastrointestinal: Positive for constipation.   All other systems reviewed and are negative.   Physical Exam:    VS:  BP 120/70 mmHg  Pulse 74  Ht 5' 4" (1.626 m)  Wt 171 lb 6.4 oz (77.747 kg)  BMI 29.41 kg/m2   Physical Exam  Constitutional: He is oriented to person, place, and time. He appears well-developed and well-nourished.  HENT:  Head: Normocephalic and atraumatic.  Neck: Normal range of motion. Neck supple. No JVD present.  Cardiovascular: Normal rate, regular rhythm and normal heart sounds.  Exam reveals no friction rub.   No murmur heard. R groin without pulsatile mass or bruit and no significant tenderness R leg trace-1+ ankle edema L leg with 1-2+ edema, calf soft and nontender L leg vein harvesting site well healed without erythema or discharge  Pulmonary/Chest: Effort normal and breath sounds normal. He has no wheezes. He has no rales.  Median sternotomy well  healed without erythema or discharge  Abdominal: Soft. He exhibits no distension and no mass. There is no tenderness.  Musculoskeletal: Normal range of motion.  Neurological: He is alert and oriented to person, place, and time.  Skin: Skin is warm and dry.  Psychiatric: He has a normal mood and affect.    Wt Readings from Last 3 Encounters:  08/01/15 171 lb 6.4 oz (77.747 kg)  07/14/15 185 lb 6.5 oz (84.1 kg)  04/12/15 177 lb (80.287 kg)      Studies/Labs Reviewed:     EKG:  EKG is  ordered today.  The ekg ordered today demonstrates NSR, HR 73, leftward axis, poor R-wave progression, QTc 438 ms, no significant changes  Recent Labs: 01/17/2015: ALT 56* 07/09/2015: Magnesium 2.3 07/13/2015: Hemoglobin 8.5*; Platelets 307 07/14/2015: BUN 30*; Creatinine, Ser 1.34*; Potassium 3.7; Sodium 133*   Recent Lipid Panel    Component Value Date/Time   CHOL 118 07/03/2015 0255   TRIG 120 07/03/2015 0255   TRIG 133 02/25/2006 0822   HDL 43 07/03/2015 0255   CHOLHDL 2.7 07/03/2015 0255   CHOLHDL 4.1 CALC 02/25/2006 0822   VLDL 24 07/03/2015 0255   LDLCALC 51 07/03/2015 0255    Additional studies/ records that were reviewed today include:   Intraop TEE 07/08/15 EF 55% to 60%. Wall motion was normal  Carotid US 07/06/15 Bilaterally - 1% to 39% ICA stenosis. Vertebral artery flow is antegrade.  Echo 07/06/15 EF 55% to 60%. Wall motion was normal, Gr 1 DD, Trivial MR/TR  LHC 07/04/15  Ost Cx lesion, 50% stenosed.  Mid LAD lesion, 95% stenosed. Severe, diffuse disease in the mid to distal LAD which is all heavily calcified. The origin of a moderate-sized diagonal is also compromised.  Prox LAD to Mid LAD lesion, 50% stenosed.  RPDA lesion, 80% stenosed.  The left ventricular systolic function is normal.  Normal LVEDP.   ASSESSMENT:     1. Atherosclerosis of native coronary artery of native heart without angina pectoris   2. Essential hypertension   3. Hyperlipidemia  4. CKD  (chronic kidney disease) stage 3, GFR 30-59 ml/min   5. Bilateral carotid artery disease (HCC)   6. Diet-controlled type 2 diabetes mellitus (HCC)   7. Edema of left lower extremity   8. Pseudoaneurysm (HCC)     PLAN:     In order of problems listed above:  1. CAD - Prior PCI last of which was in 2002.  Recent admit with [place] and subsequent CABG.  He is progressing well. He sees Dr. Gerhardt next week.  -  Continue aspirin, statin, beta blocker.  -  Refer to Cardiac Rehab  2. HTN - Controlled. Eventually try to resume ACE inhibitor if renal function remains stable.  3. HL - Continue high-dose statin. Recent LDL 51.  4. CKD - Repeat BMET today.  5. Carotid artery disease - Mild bilat plaque by US at CABG.  Consider repeat US in 1-2 years.  6. DM - FU with PCP for strict control.   Lab Results  Component Value Date   HGBA1C 7.9* 07/07/2015   7. Edema - His left leg edema is likely related to vein harvesting on that side. He still has some volume excess. I will give him 1 more week of Lasix 40 mg daily and potassium 20 mEq daily. Repeat BMET in one week. As his left leg edema is persistent, I will have him undergo venous duplex to rule out the possibility of DVT.  8. Right groin pseudoaneurysm - This has resolved based upon exam.  He has no RFA site tenderness, pulsatile mass or bruit.     Medication Adjustments/Labs and Tests Ordered: Current medicines are reviewed at length with the patient today.  Concerns regarding medicines are outlined above.  Medication changes, Labs and Tests ordered today are outlined in the Patient Instructions noted below. Patient Instructions  Medication Instructions:  1. START LASIX 40 MG DAILY FOR 7 DAYS THEN TAKE AS NEEDED FOR SWELLING 2. START POTASSIUM 20 MEQ DAILY FOR 7 DAYS THEN TAKE AS NEEDED WHEN YOU TAKE THE LASIX Labwork: 1. TODAY BMET 2. IN 1 WEEK REPEAT BMET Testing/Procedures: Your physician has requested that you have a LEFT LEG lower  extremity venous duplex. This test is an ultrasound of the veins in the legs or arms. It looks at venous blood flow that carries blood from the heart to the legs or arms. Allow one hour for a Lower Venous exam. Allow thirty minutes for an Upper Venous exam. There are no restrictions or special instructions. Follow-Up: DR. NISHAN IN 2 MONTHS Any Other Special Instructions Will Be Listed Below (If Applicable). YOU ARE BEING REFERRED TO CARDIAC REHAB AT [place] If you need a refill on your cardiac medications before your next appointment, please call your pharmacy.    Signed, Scott Weaver, PA-C  08/01/2015 10:04 AM    [facility] [address], [place], [place]  [address] Phone: [phone]; Fax: [phone]     

## 2015-08-01 ENCOUNTER — Ambulatory Visit (INDEPENDENT_AMBULATORY_CARE_PROVIDER_SITE_OTHER): Payer: 59 | Admitting: Physician Assistant

## 2015-08-01 ENCOUNTER — Encounter: Payer: Self-pay | Admitting: Physician Assistant

## 2015-08-01 VITALS — BP 120/70 | HR 74 | Ht 64.0 in | Wt 171.4 lb

## 2015-08-01 DIAGNOSIS — E119 Type 2 diabetes mellitus without complications: Secondary | ICD-10-CM

## 2015-08-01 DIAGNOSIS — I2510 Atherosclerotic heart disease of native coronary artery without angina pectoris: Secondary | ICD-10-CM | POA: Diagnosis not present

## 2015-08-01 DIAGNOSIS — R600 Localized edema: Secondary | ICD-10-CM

## 2015-08-01 DIAGNOSIS — I10 Essential (primary) hypertension: Secondary | ICD-10-CM | POA: Diagnosis not present

## 2015-08-01 DIAGNOSIS — I729 Aneurysm of unspecified site: Secondary | ICD-10-CM

## 2015-08-01 DIAGNOSIS — E785 Hyperlipidemia, unspecified: Secondary | ICD-10-CM | POA: Diagnosis not present

## 2015-08-01 DIAGNOSIS — N183 Chronic kidney disease, stage 3 unspecified: Secondary | ICD-10-CM | POA: Insufficient documentation

## 2015-08-01 DIAGNOSIS — I779 Disorder of arteries and arterioles, unspecified: Secondary | ICD-10-CM

## 2015-08-01 DIAGNOSIS — I739 Peripheral vascular disease, unspecified: Secondary | ICD-10-CM

## 2015-08-01 LAB — BASIC METABOLIC PANEL
BUN: 12 mg/dL (ref 7–25)
CALCIUM: 9.5 mg/dL (ref 8.6–10.3)
CO2: 25 mmol/L (ref 20–31)
CREATININE: 1.02 mg/dL (ref 0.70–1.18)
Chloride: 105 mmol/L (ref 98–110)
GLUCOSE: 108 mg/dL — AB (ref 65–99)
Potassium: 4.5 mmol/L (ref 3.5–5.3)
SODIUM: 140 mmol/L (ref 135–146)

## 2015-08-01 MED ORDER — FUROSEMIDE 40 MG PO TABS: 40.0000 mg | ORAL_TABLET | ORAL | Status: DC

## 2015-08-01 MED ORDER — POTASSIUM CHLORIDE CRYS ER 20 MEQ PO TBCR: 20.0000 meq | EXTENDED_RELEASE_TABLET | ORAL | Status: DC

## 2015-08-01 NOTE — Patient Instructions (Addendum)
Medication Instructions:  1. START LASIX 40 MG DAILY FOR 7 DAYS THEN TAKE AS NEEDED FOR SWELLING 2. START POTASSIUM 20 MEQ DAILY FOR 7 DAYS THEN TAKE AS NEEDED WHEN YOU TAKE THE LASIX Labwork: 1. TODAY BMET 2. IN 1 WEEK REPEAT BMET Testing/Procedures: Your physician has requested that you have a LEFT LEG lower extremity venous duplex. This test is an ultrasound of the veins in the legs or arms. It looks at venous blood flow that carries blood from the heart to the legs or arms. Allow one hour for a Lower Venous exam. Allow thirty minutes for an Upper Venous exam. There are no restrictions or special instructions. Follow-Up: DR. Johnsie Cancel IN 2 MONTHS Any Other Special Instructions Will Be Listed Below (If Applicable). YOU ARE BEING REFERRED TO CARDIAC REHAB AT [facility] If you need a refill on your cardiac medications before your next appointment, please call your pharmacy.

## 2015-08-02 ENCOUNTER — Telehealth: Payer: Self-pay | Admitting: *Deleted

## 2015-08-02 ENCOUNTER — Ambulatory Visit (HOSPITAL_COMMUNITY)
Admission: RE | Admit: 2015-08-02 | Discharge: 2015-08-02 | Disposition: A | Discharge status: Home / Self Care | Payer: 59 | Source: Ambulatory Visit | Attending: Cardiovascular Disease | Admitting: Cardiovascular Disease

## 2015-08-02 DIAGNOSIS — E119 Type 2 diabetes mellitus without complications: Secondary | ICD-10-CM | POA: Diagnosis not present

## 2015-08-02 DIAGNOSIS — I2510 Atherosclerotic heart disease of native coronary artery without angina pectoris: Secondary | ICD-10-CM | POA: Insufficient documentation

## 2015-08-02 DIAGNOSIS — R600 Localized edema: Secondary | ICD-10-CM | POA: Diagnosis not present

## 2015-08-02 DIAGNOSIS — E785 Hyperlipidemia, unspecified: Secondary | ICD-10-CM | POA: Diagnosis not present

## 2015-08-02 DIAGNOSIS — I10 Essential (primary) hypertension: Secondary | ICD-10-CM | POA: Diagnosis not present

## 2015-08-02 NOTE — Telephone Encounter (Signed)
Pt notified of lab results by phone with verbal understanding.  

## 2015-08-08 ENCOUNTER — Other Ambulatory Visit (INDEPENDENT_AMBULATORY_CARE_PROVIDER_SITE_OTHER): Payer: 59 | Admitting: *Deleted

## 2015-08-08 DIAGNOSIS — R600 Localized edema: Secondary | ICD-10-CM

## 2015-08-08 LAB — BASIC METABOLIC PANEL
BUN: 17 mg/dL (ref 7–25)
CALCIUM: 9.7 mg/dL (ref 8.6–10.3)
CHLORIDE: 101 mmol/L (ref 98–110)
CO2: 27 mmol/L (ref 20–31)
Creat: 1.25 mg/dL — ABNORMAL HIGH (ref 0.70–1.18)
GLUCOSE: 107 mg/dL — AB (ref 65–99)
POTASSIUM: 4.4 mmol/L (ref 3.5–5.3)
SODIUM: 141 mmol/L (ref 135–146)

## 2015-08-08 NOTE — Addendum Note (Signed)
Addended by: Eulis Foster on: 08/08/2015 07:49 AM   Modules accepted: Orders

## 2015-08-10 ENCOUNTER — Other Ambulatory Visit: Payer: Self-pay | Admitting: Cardiothoracic Surgery

## 2015-08-10 DIAGNOSIS — Z951 Presence of aortocoronary bypass graft: Secondary | ICD-10-CM

## 2015-08-11 ENCOUNTER — Encounter: Payer: Self-pay | Admitting: Cardiothoracic Surgery

## 2015-08-11 ENCOUNTER — Ambulatory Visit
Admission: RE | Admit: 2015-08-11 | Discharge: 2015-08-11 | Disposition: A | Discharge status: Home / Self Care | Payer: 59 | Source: Ambulatory Visit | Attending: Cardiothoracic Surgery | Admitting: Cardiothoracic Surgery

## 2015-08-11 ENCOUNTER — Ambulatory Visit (INDEPENDENT_AMBULATORY_CARE_PROVIDER_SITE_OTHER): Payer: Self-pay | Admitting: Cardiothoracic Surgery

## 2015-08-11 VITALS — BP 124/74 | HR 72 | Resp 16 | Ht 64.0 in | Wt 164.0 lb

## 2015-08-11 DIAGNOSIS — I25110 Atherosclerotic heart disease of native coronary artery with unstable angina pectoris: Secondary | ICD-10-CM

## 2015-08-11 DIAGNOSIS — Z951 Presence of aortocoronary bypass graft: Secondary | ICD-10-CM

## 2015-08-11 NOTE — Progress Notes (Signed)
[address]       Clifford [name],[place] [address]             [phone]      [name] Medical Record #[id number] Date of Birth: 01-25-1946  Referring: Josue Hector, MD Primary Care: Hoyt Koch, MD  Chief Complaint:   POST OP FOLLOW UP 07/08/2015  OPERATIVE REPORT PREOPERATIVE DIAGNOSIS: Coronary occlusive disease with unstable angina. POSTOPERATIVE DIAGNOSIS: Coronary occlusive disease with unstable angina. SURGICAL PROCEDURE: Coronary artery bypass grafting x3 with left internal mammary to the left anterior descending coronary artery, reverse saphenous vein graft to the diagonal coronary artery, reverse saphenous vein graft to the posterolateral branch of the right coronary artery, and placement of left femoral arterial line with ultrasound guidance, and left thigh greater saphenous endo vein harvesting. SURGEON: Lanelle Bal, M.D.  History of Present Illness:     Patient is status post recent coronary artery bypass grafting and returns to the office for follow-up visit. He is making good progress postoperatively. He notes he's walking up to 4 miles a day. Denies symptoms of congestive heart .  Failure or angina. The patient has been diligent about checking his postoperative glucose levels. At the time of surgery he was found to have a hemoglobin A1c of 7.9. Preoperatively he did been on lisinopril, after surgery with the addition of beta blocker his blood pressure was too low to support continuing on lisinopril. Patient will start in cardiac rehabilitation in the near future   Past Medical History  Diagnosis Date  . Hyperlipidemia   . Hypertension   . History of detached retina repair 11-06-2006    [facility]  . CAD (coronary artery disease) 2005    a. Previous stents RCA and circ, Cath 2005 patient with 70% PDA  //  b. [place] 5/17 >> LHC oLCx 50, pLAD 50, mLAD 95, RPDA 80, normal LVEF >> s/p CABG  . History of prostate  cancer 03/2005    s/p prostatectomy  . History of hematuria   . Cataract     replace 11/2012  . Diabetes mellitus     diet controlled  . Carotid artery disease ([place])     a. Carotid US 5/17 bilat ICA 1-39%  . History of echocardiogram     a. EF 55% to 60%. Wall motion was normal, Gr 1 DD, Trivial MR/TR     History  Smoking status  . Former Smoker  . Quit date: 02/27/1983  Smokeless tobacco  . Never Used    History  Alcohol Use No     Allergies  Allergen Reactions  . Cinnamon Other (See Comments)    Stomach ache     Current Outpatient Prescriptions  Medication Sig Dispense Refill  . Alcohol Swabs (ALCOHOL WIPES) PADS Use prior to each blood glucose check 100 each 3  . Ascorbic Acid (VITAMIN C PO) Take 1 tablet by mouth daily.    Marland Kitchen aspirin EC 81 MG tablet Take 81 mg by mouth once.     . blood glucose meter kit and supplies Dispense based on patient and insurance preference. Use up to four times daily as directed. (FOR ICD-9 250.00, 250.01). 1 each 0  . Metoprolol Tartrate 37.5 MG TABS Take 37.5 mg by mouth 2 (two) times daily. 60 tablet 3  . Multiple Vitamin (MULTIVITAMIN) tablet Take 1 tablet by mouth daily.      . ONE TOUCH ULTRA TEST test strip     .  ONETOUCH DELICA LANCETS 67R MISC     . VYTORIN 10-80 MG tablet Take 1 tablet by mouth  daily 90 tablet 1   No current facility-administered medications for this visit.       Physical Exam: BP 124/74 mmHg  Pulse 72  Resp 16  Ht _0  (1.626 m)  Wt 164 lb (74.39 kg)  BMI 28.14 kg/m2  SpO2 98%  General appearance: alert, cooperative and appears older than stated age Neurologic: intact Heart: regular rate and rhythm, S1, S2 normal, no murmur, click, rub or gallop Lungs: clear to auscultation bilaterally Abdomen: soft, non-tender; bowel sounds normal; no masses,  no organomegaly Extremities: extremities normal, atraumatic, no cyanosis or , Homans sign is negative, no sign of DVT, he has mild bilateral pedal  edema Wound: Sternum stable well healed   Diagnostic Studies & Laboratory data:     Recent Radiology Findings:   Dg Chest 2 View  08/11/2015  CLINICAL DATA:  70 year old male status post CABG 5 weeks ago. Subsequent encounter. EXAM: CHEST  2 VIEW COMPARISON:  07/11/2015 and earlier. FINDINGS: Lung volumes at baseline. Mediastinal contours are stable and within normal limits. Sequelae of CABG. No pneumothorax, pulmonary edema, pleural effusion or acute pulmonary opacity; minimal to mild residual left lower lobe streaky opacity. Osteopenia. No acute osseous abnormality identified. Calcified aortic atherosclerosis. IMPRESSION: No acute cardiopulmonary abnormality; minimal residual left lower lobe atelectasis. Electronically Signed   By: Genevie Ann M.D.   On: 08/11/2015 09:19      Recent Lab Findings: Lab Results  Component Value Date   WBC 8.2 07/13/2015   HGB 8.5* 07/13/2015   HCT 25.1* 07/13/2015   PLT 307 07/13/2015   GLUCOSE 107* 08/08/2015   CHOL 118 07/03/2015   TRIG 120 07/03/2015   HDL 43 07/03/2015   LDLCALC 51 07/03/2015   ALT 56* 01/17/2015   AST 37 01/17/2015   NA 141 08/08/2015   K 4.4 08/08/2015   CL 101 08/08/2015   CREATININE 1.25* 08/08/2015   BUN 17 08/08/2015   CO2 27 08/08/2015   TSH 1.13 01/14/2014   INR 1.69* 07/08/2015   HGBA1C 7.9* 07/07/2015      Assessment / Plan:    Patient is status post recent coronary artery bypass grafting making good progress  history of diet-controlled type 2 diabetes preop hemoglobin A1c of 7.9  Patient will continue on Vytorin aspirin and metoprolol, he will wait to restart lisinopril per cardiology recommendation  I plan to see him back as needed.   [name] MD      [address] [place] [address] Office [phone]   Beeper [phone]  08/11/2015 10:56 AM

## 2015-08-19 ENCOUNTER — Other Ambulatory Visit: Payer: Self-pay | Admitting: *Deleted

## 2015-08-19 MED ORDER — ONETOUCH DELICA LANCETS 33G MISC: Status: DC

## 2015-08-19 MED ORDER — ONETOUCH ULTRA BLUE VI STRP: 1.0000 | ORAL_STRIP | Freq: Two times a day (BID) | Status: DC

## 2015-08-19 NOTE — Telephone Encounter (Signed)
Receive call pt states he had triple by-pass surgery abt 4-5 wks ago. Was given a BS monitor to check his blood sugars & he is now needing refill on the strips & lancets. Have appt to see Dr. [name] 7/11, but wanting to get refill today. Verified pharmacy inform will send rx to [facility].../lmb

## 2015-09-08 ENCOUNTER — Ambulatory Visit (INDEPENDENT_AMBULATORY_CARE_PROVIDER_SITE_OTHER): Payer: Self-pay | Admitting: Cardiothoracic Surgery

## 2015-09-08 ENCOUNTER — Encounter: Payer: Self-pay | Admitting: Cardiothoracic Surgery

## 2015-09-08 VITALS — BP 150/90 | HR 60 | Resp 20 | Ht 64.0 in | Wt 164.0 lb

## 2015-09-08 DIAGNOSIS — L7682 Other postprocedural complications of skin and subcutaneous tissue: Secondary | ICD-10-CM

## 2015-09-08 DIAGNOSIS — Z951 Presence of aortocoronary bypass graft: Secondary | ICD-10-CM

## 2015-09-08 DIAGNOSIS — R208 Other disturbances of skin sensation: Secondary | ICD-10-CM

## 2015-09-08 DIAGNOSIS — I25110 Atherosclerotic heart disease of native coronary artery with unstable angina pectoris: Secondary | ICD-10-CM

## 2015-09-08 NOTE — Progress Notes (Signed)
[address]       [place] [address]             [phone]      Melquan C [place] Medical Record #[id number] Date of Birth: [birth year]  Referring: Josue Hector, MD Primary Care: Hoyt Koch, MD  Chief Complaint:   POST OP FOLLOW UP 07/08/2015  OPERATIVE REPORT PREOPERATIVE DIAGNOSIS: Coronary occlusive disease with unstable angina. POSTOPERATIVE DIAGNOSIS: Coronary occlusive disease with unstable angina. SURGICAL PROCEDURE: Coronary artery bypass grafting x3 with left internal mammary to the left anterior descending coronary artery, reverse saphenous vein graft to the diagonal coronary artery, reverse saphenous vein graft to the posterolateral branch of the right coronary artery, and placement of left femoral arterial line with ultrasound guidance, and left thigh greater saphenous endo vein harvesting. SURGEON: Lanelle Bal, M.D.  History of Present Illness:     Patient comes to the office today concerned about his incision, especially at the very top. He's had no fever chills. He says he is walking 5 miles a day without evidence of angina or congestive heart failure.   Past Medical History  Diagnosis Date  . Hyperlipidemia   . Hypertension   . History of detached retina repair 11-06-2006    [facility]  . CAD (coronary artery disease) 2005    a. Previous stents RCA and circ, Cath 2005 patient with 70% PDA  //  b. [place] 5/17 >> LHC oLCx 50, pLAD 50, mLAD 95, RPDA 80, normal LVEF >> s/p CABG  . History of prostate cancer 03/2005    s/p prostatectomy  . History of hematuria   . Cataract     replace 11/2012  . Diabetes mellitus     diet controlled  . Carotid artery disease ([place])     a. Carotid US 5/17 bilat ICA 1-39%  . History of echocardiogram     a. EF 55% to 60%. Wall motion was normal, Gr 1 DD, Trivial MR/TR     History  Smoking status  . Former Smoker  . Quit date: 02/27/1983  Smokeless tobacco  .  Never Used    History  Alcohol Use No     Allergies  Allergen Reactions  . Cinnamon Other (See Comments)    Stomach ache     Current Outpatient Prescriptions  Medication Sig Dispense Refill  . Ascorbic Acid (VITAMIN C PO) Take 1 tablet by mouth daily.    Marland Kitchen aspirin EC 81 MG tablet Take 81 mg by mouth once.     . Metoprolol Tartrate 37.5 MG TABS Take 37.5 mg by mouth 2 (two) times daily. 60 tablet 3  . Multiple Vitamin (MULTIVITAMIN) tablet Take 1 tablet by mouth daily.      Marland Kitchen VYTORIN 10-80 MG tablet Take 1 tablet by mouth  daily 90 tablet 1  . blood glucose meter kit and supplies Dispense based on patient and insurance preference. Use up to four times daily as directed. (FOR ICD-9 250.00, 250.01). (Patient not taking: Reported on 09/08/2015) 1 each 0  . ONE TOUCH ULTRA TEST test strip 1 each by Other route 2 (two) times daily. Use to check blood sugars twice a day Dx E11.9 (Patient not taking: Reported on 09/08/2015) 100 each 0  . ONETOUCH DELICA LANCETS 86V MISC Use to help check blood sugars twice a day Dx e11.9 (Patient not taking: Reported on 09/08/2015) 100 each 0   No current facility-administered medications for this visit.  Physical Exam: BP 150/90 mmHg  Pulse 60  Resp 20  Ht '5\' 4"'  (1.626 m)  Wt 164 lb (74.39 kg)  BMI 28.14 kg/m2  SpO2 98%  General appearance: alert, cooperative and appears older than stated age Neurologic: intact Heart: regular rate and rhythm, S1, S2 normal, no murmur, click, rub or gallop Lungs: clear to auscultation bilaterally Abdomen: soft, non-tender; bowel sounds normal; no masses,  no organomegaly Extremities: extremities normal, atraumatic, no cyanosis or , Homans sign is negative, no sign of DVT, he has mild bilateral pedal edema Wound: Sternum stable well healed, there is no protruding wires no evidence of infection, no popping or clicking of the incision   Diagnostic Studies & Laboratory data:     Recent Radiology Findings:    No results found.    Recent Lab Findings: Lab Results  Component Value Date   WBC 8.2 07/13/2015   HGB 8.5* 07/13/2015   HCT 25.1* 07/13/2015   PLT 307 07/13/2015   GLUCOSE 107* 08/08/2015   CHOL 118 07/03/2015   TRIG 120 07/03/2015   HDL 43 07/03/2015   LDLCALC 51 07/03/2015   ALT 56* 01/17/2015   AST 37 01/17/2015   NA 141 08/08/2015   K 4.4 08/08/2015   CL 101 08/08/2015   CREATININE 1.25* 08/08/2015   BUN 17 08/08/2015   CO2 27 08/08/2015   TSH 1.13 01/14/2014   INR 1.69* 07/08/2015   HGBA1C 7.9* 07/07/2015      Assessment / Plan:    Patient is status post recent coronary artery bypass grafting making good progress  history of diet-controlled type 2 diabetes preop hemoglobin A1c of 7.9 Sternal incision is intact without evidence of infection or protruding wires   I plan to see him back as needed.   [name] MD      [address] [place] [address] Office [phone]   Beeper [phone]  09/08/2015 11:31 AM

## 2015-09-12 ENCOUNTER — Encounter: Payer: Self-pay | Admitting: Internal Medicine

## 2015-09-12 ENCOUNTER — Ambulatory Visit (INDEPENDENT_AMBULATORY_CARE_PROVIDER_SITE_OTHER): Payer: 59 | Admitting: Internal Medicine

## 2015-09-12 ENCOUNTER — Other Ambulatory Visit (INDEPENDENT_AMBULATORY_CARE_PROVIDER_SITE_OTHER): Payer: 59

## 2015-09-12 VITALS — BP 152/82 | HR 71 | Temp 97.9°F | Resp 14 | Ht 64.0 in | Wt 169.2 lb

## 2015-09-12 DIAGNOSIS — N183 Chronic kidney disease, stage 3 unspecified: Secondary | ICD-10-CM

## 2015-09-12 DIAGNOSIS — I10 Essential (primary) hypertension: Secondary | ICD-10-CM

## 2015-09-12 DIAGNOSIS — E119 Type 2 diabetes mellitus without complications: Secondary | ICD-10-CM

## 2015-09-12 DIAGNOSIS — Z951 Presence of aortocoronary bypass graft: Secondary | ICD-10-CM

## 2015-09-12 DIAGNOSIS — E7800 Pure hypercholesterolemia, unspecified: Secondary | ICD-10-CM

## 2015-09-12 LAB — COMPREHENSIVE METABOLIC PANEL
ALBUMIN: 4.4 g/dL (ref 3.5–5.2)
ALT: 16 U/L (ref 0–53)
AST: 17 U/L (ref 0–37)
Alkaline Phosphatase: 44 U/L (ref 39–117)
BUN: 16 mg/dL (ref 6–23)
CHLORIDE: 105 meq/L (ref 96–112)
CO2: 28 mEq/L (ref 19–32)
CREATININE: 1.25 mg/dL (ref 0.40–1.50)
Calcium: 9.8 mg/dL (ref 8.4–10.5)
GFR: 60.3 mL/min (ref >60.00)
GLUCOSE: 129 mg/dL — AB (ref 70–99)
POTASSIUM: 4.6 meq/L (ref 3.5–5.1)
SODIUM: 141 meq/L (ref 135–145)
Total Bilirubin: 0.7 mg/dL (ref 0.2–1.2)
Total Protein: 7.4 g/dL (ref 6.0–8.3)

## 2015-09-12 LAB — HEMOGLOBIN A1C: HEMOGLOBIN A1C: 6.2 % (ref 4.6–6.5)

## 2015-09-12 MED ORDER — ONETOUCH DELICA LANCETS 33G MISC: Status: AC

## 2015-09-12 MED ORDER — ONETOUCH ULTRA BLUE VI STRP: 1.0000 | ORAL_STRIP | Freq: Two times a day (BID) | Status: AC

## 2015-09-12 NOTE — Assessment & Plan Note (Signed)
Taking vytorin.

## 2015-09-12 NOTE — Assessment & Plan Note (Signed)
Recent CABG and starting rehab in about 2 weeks. Pain is much improved and walking 5 miles per day. Has worked on diet some. Taking his metoprolol BID as prescribed.

## 2015-09-12 NOTE — Assessment & Plan Note (Signed)
BP mildly elevated today which is not usual. He is going to be followed with cardiology and he is not willing to make changes today unless this is an ongoing problem.

## 2015-09-12 NOTE — Progress Notes (Signed)
Pre visit review using our clinic review tool, if applicable. No additional management support is needed unless otherwise documented below in the visit note. 

## 2015-09-12 NOTE — Assessment & Plan Note (Signed)
Recheck today and adjust as needed. Diabetes and hypertension which are not well controlled now. Checking and adjusting therapy for those today.

## 2015-09-12 NOTE — Assessment & Plan Note (Signed)
At this time checking HgA1c, has been trending up for the last year or so and 7.9 prior to CABG. He has made changes with portions and walking 5 miles per day so may be less now. He would like to avoid medicines if possible. May need to go back on ACE-I for renal protection and BP but he declines to do that today. Recent microalbumin to creatinine ratio is without renal damage.

## 2015-09-12 NOTE — Patient Instructions (Signed)
We are checking the blood work today for the sugars to check the HgA1c. If it is coming down we will let you continue to work on portions to help with the weight and the sugars.   Keep up the good work with exercising regularly.   We will see you back in November for the physical.   Diabetes and Standards of Medical Care Diabetes is complicated. You may find that your diabetes team includes a dietitian, nurse, diabetes educator, eye doctor, and more. To help everyone know what is going on and to help you get the care you deserve, the following schedule of care was developed to help keep you on track. Below are the tests, exams, vaccines, medicines, education, and plans you will need. HbA1c test This test shows how well you have controlled your glucose over the past 2-3 months. It is used to see if your diabetes management plan needs to be adjusted.   It is performed at least 2 times a year if you are meeting treatment goals.  It is performed 4 times a year if therapy has changed or if you are not meeting treatment goals. Blood pressure test  This test is performed at every routine medical visit. The goal is less than 140/90 mm Hg for most people, but 130/80 mm Hg in some cases. Ask your health care provider about your goal. Dental exam  Follow up with the dentist regularly. Eye exam  If you are diagnosed with type 1 diabetes as a child, get an exam upon reaching the age of 85 years or older and having had diabetes for 3-5 years. Yearly eye exams are recommended after that initial eye exam.  If you are diagnosed with type 1 diabetes as an adult, get an exam within 5 years of diagnosis and then yearly.  If you are diagnosed with type 2 diabetes, get an exam as soon as possible after the diagnosis and then yearly. Foot care exam  Visual foot exams are performed at every routine medical visit. The exams check for cuts, injuries, or other problems with the feet.  You should have a complete  foot exam performed every year. This exam includes an inspection of the structure and skin of your feet, a check of the pulses in your feet, and a check of the sensation in your feet.  Type 1 diabetes: The first exam is performed 5 years after diagnosis.  Type 2 diabetes: The first exam is performed at the time of diagnosis.  Check your feet nightly for cuts, injuries, or other problems with your feet. Tell your health care provider if anything is not healing. Kidney function test (urine microalbumin)  This test is performed once a year.  Type 1 diabetes: The first test is performed 5 years after diagnosis.  Type 2 diabetes: The first test is performed at the time of diagnosis.  A serum creatinine and estimated glomerular filtration rate (eGFR) test is done once a year to assess the level of chronic kidney disease (CKD), if present. Lipid profile (cholesterol, HDL, LDL, triglycerides)  Performed every 5 years for most people.  The goal for LDL is less than 100 mg/dL. If you are at high risk, the goal is less than 70 mg/dL.  The goal for HDL is 40 mg/dL-50 mg/dL for men and 50 mg/dL-60 mg/dL for women. An HDL cholesterol of 60 mg/dL or higher gives some protection against heart disease.  The goal for triglycerides is less than 150 mg/dL. Immunizations  The  flu (influenza) vaccine is recommended yearly for every person 30 months of age or older who has diabetes.  The pneumonia (pneumococcal) vaccine is recommended for every person 51 years of age or older who has diabetes. Adults 22 years of age or older may receive the pneumonia vaccine as a series of two separate shots.  The hepatitis B vaccine is recommended for adults shortly after they have been diagnosed with diabetes.  The Tdap (tetanus, diphtheria, and pertussis) vaccine should be given:  According to normal childhood vaccination schedules, for children.  Every 10 years, for adults who have diabetes. Diabetes self-management  education  Education is recommended at diagnosis and ongoing as needed. Treatment plan  Your treatment plan is reviewed at every medical visit.   This information is not intended to replace advice given to you by your health care provider. Make sure you discuss any questions you have with your health care provider.   Document Released: 12/10/2008 Document Revised: 03/05/2014 Document Reviewed: 07/15/2012 Elsevier Interactive Patient Education [employer].

## 2015-09-12 NOTE — Progress Notes (Signed)
   Subjective:    Patient ID: Clifford [name], male    DOB: 05-10-1945, 70 y.o.   MRN: [id number]  HPI The patient is a 70 YO man coming in for change of care due to his previous provider leaving the practice. He is here for follow up of his medical problems including his diabetes (currently diet controlled but HgA1c 7.9 prior to recent CABG, not on ACE-I or ARB, on statin, not complicated, walking 5 miles a day now), recent CABG (May 2017, now able to get back to walking, still following with cardiology and CT surg), and his blood pressure (taking metoprolol, previously taking lisinopril which was stopped at the time of CABG due to low blood pressure).   PMH, [facility], social history reviewed and updated.   Review of Systems  Constitutional: Positive for activity change. Negative for fever, appetite change, fatigue and unexpected weight change.  HENT: Negative.   Eyes: Negative.   Respiratory: Negative for cough, chest tightness and shortness of breath.   Cardiovascular: Negative for chest pain, palpitations and leg swelling.  Gastrointestinal: Negative for nausea, abdominal pain, diarrhea, constipation and abdominal distention.  Musculoskeletal: Negative.   Skin: Negative.   Neurological: Negative for dizziness, weakness, light-headedness, numbness and headaches.  Psychiatric/Behavioral: Negative.       Objective:   Physical Exam  Constitutional: He is oriented to person, place, and time. He appears well-developed and well-nourished.  HENT:  Head: Normocephalic and atraumatic.  Eyes: EOM are normal.  Neck: Normal range of motion. No JVD present.  Cardiovascular: Normal rate and regular rhythm.   Pulmonary/Chest: Effort normal and breath sounds normal. No respiratory distress. He has no wheezes. He has no rales.  Scar from recent CABG  Abdominal: Soft. He exhibits no distension. There is no tenderness. There is no rebound.  Musculoskeletal: He exhibits no edema.  Neurological: He is  alert and oriented to person, place, and time. Coordination normal.  Skin: Skin is warm and dry.  Psychiatric: He has a normal mood and affect.   Filed Vitals:   09/12/15 0815  BP: 152/82  Pulse: 71  Temp: 97.9 F (36.6 C)  TempSrc: Oral  Resp: 14  Height: 5\' 4"  (1.626 m)  Weight: 169 lb 3.2 oz (76.749 kg)  SpO2: 98%      Assessment & Plan:

## 2015-09-22 ENCOUNTER — Encounter (HOSPITAL_COMMUNITY): Payer: Self-pay

## 2015-09-22 ENCOUNTER — Encounter (HOSPITAL_COMMUNITY)
Admission: RE | Admit: 2015-09-22 | Discharge: 2015-09-22 | Disposition: A | Discharge status: Home / Self Care | Payer: 59 | Source: Ambulatory Visit | Attending: Cardiovascular Disease | Admitting: Cardiovascular Disease

## 2015-09-22 VITALS — BP 138/78 | HR 60 | Ht 63.75 in | Wt 172.2 lb

## 2015-09-22 DIAGNOSIS — Z951 Presence of aortocoronary bypass graft: Secondary | ICD-10-CM | POA: Diagnosis not present

## 2015-09-22 NOTE — Progress Notes (Signed)
Cardiac Rehab Medication Review by a Pharmacist  Does the patient  feel that his/her medications are working for him/her?  yes  Has the patient been experiencing any side effects to the medications prescribed?  no  Does the patient measure his/her own blood pressure or blood glucose at home?  Yes, both  Does the patient have any problems obtaining medications due to transportation or finances?   no  Understanding of regimen: excellent Understanding of indications: good Potential of compliance: good   Pharmacist comments: GF is a 54 yoM who presents to cardiac rehab in good spirits with no assistance. He is a highly motivated patient, and is very knowledgeable about his health conditions and medications. Patient voiced understanding of his med regimen.  Belia Heman, PharmD PGY1 Pharmacy Resident [phone] (Pager) 09/22/2015 8:16 AM

## 2015-09-22 NOTE — Progress Notes (Signed)
Cardiac Individual Treatment Plan  Patient Details  Name: RYLAN BERNARD MRN: [id number] Date of Birth: [birth year] Referring Provider:   April Manson CARDIAC REHAB PHASE II ORIENTATION from 09/22/2015 in [place]  Referring Provider  Jenkins Rouge MD      Initial Encounter Date:  [place] PHASE II ORIENTATION from 09/22/2015 in [place]  Date  09/22/15  Referring Provider  Jenkins Rouge MD      Visit Diagnosis: 07/08/15 S/P CABG x 3  Patient's Home Medications on Admission:  Current Outpatient Prescriptions:  .  Ascorbic Acid (VITAMIN C PO), Take 1 tablet by mouth daily., Disp: , Rfl:  .  aspirin EC 81 MG tablet, Take 81 mg by mouth once. , Disp: , Rfl:  .  blood glucose meter kit and supplies, Dispense based on patient and insurance preference. Use up to four times daily as directed. (FOR ICD-9 250.00, 250.01)., Disp: 1 each, Rfl: 0 .  Metoprolol Tartrate 37.5 MG TABS, Take 37.5 mg by mouth 2 (two) times daily., Disp: 60 tablet, Rfl: 3 .  Multiple Vitamin (MULTIVITAMIN) tablet, Take 1 tablet by mouth daily.  , Disp: , Rfl:  .  ONE TOUCH ULTRA TEST test strip, 1 each by Other route 2 (two) times daily. Use to check blood sugars twice a day Dx E11.9, Disp: 100 each, Rfl: 5 .  ONETOUCH DELICA LANCETS 91M MISC, Use to help check blood sugars twice a day Dx e11.9, Disp: 100 each, Rfl: 5 .  VYTORIN 10-80 MG tablet, Take 1 tablet by mouth  daily, Disp: 90 tablet, Rfl: 1  Past Medical History: Past Medical History:  Diagnosis Date  . CAD (coronary artery disease) 2005   a. Previous stents RCA and circ, Cath 2005 patient with 70% PDA  //  b. [place] 5/17 >> LHC oLCx 50, pLAD 50, mLAD 95, RPDA 80, normal LVEF >> s/p CABG  . Carotid artery disease ([place])    a. Carotid US 5/17 bilat ICA 1-39%  . Cataract    replace 11/2012  . Diabetes mellitus    diet controlled  . History of detached retina repair  11-06-2006   [facility]  . History of echocardiogram    a. EF 55% to 60%. Wall motion was normal, Gr 1 DD, Trivial MR/TR  . History of hematuria   . History of prostate cancer 03/2005   s/p prostatectomy  . Hyperlipidemia   . Hypertension     Tobacco Use: History  Smoking Status  . Former Smoker  . Quit date: 02/27/1983  Smokeless Tobacco  . Never Used    Labs: Recent Review Flowsheet Data    Labs for ITP Cardiac and Pulmonary Rehab Latest Ref Rng & Units 07/08/2015 07/08/2015 07/08/2015 07/09/2015 09/12/2015   Cholestrol 0 - 200 mg/dL - - - - -   LDLCALC 0 - 99 mg/dL - - - - -   HDL >40 mg/dL - - - - -   Trlycerides <150 mg/dL - - - - -   Hemoglobin A1c 4.6 - 6.5 % - - - - 6.2   PHART 7.350 - 7.450 7.304(L) - 7.343(L) - -   PCO2ART 35.0 - 45.0 mmHg 46.0(H) - 38.6 - -   HCO3 20.0 - 24.0 mEq/L 22.7 - 20.8 - -   TCO2 0 - 100 mmol/L _0 -   ACIDBASEDEF 0.0 - 2.0 mmol/L 3.0(H) - 4.0(H) - -   O2SAT %  93.0 - 93.0 - -      Capillary Blood Glucose: Lab Results  Component Value Date   GLUCAP 142 (H) 07/14/2015   GLUCAP 121 (H) 07/14/2015   GLUCAP 115 (H) 07/13/2015   GLUCAP 122 (H) 07/13/2015   GLUCAP 115 (H) 07/13/2015     Exercise Target Goals: Date: 09/22/15  Exercise Program Goal: Individual exercise prescription set with THRR, safety & activity barriers. Participant demonstrates ability to understand and report RPE using BORG scale, to self-measure pulse accurately, and to acknowledge the importance of the exercise prescription.  Exercise Prescription Goal: Starting with aerobic activity 30 plus minutes a day, 3 days per week for initial exercise prescription. Provide home exercise prescription and guidelines that participant acknowledges understanding prior to discharge.  Activity Barriers & Risk Stratification:     Activity Barriers & Cardiac Risk Stratification - 09/22/15 0850      Activity Barriers & Cardiac Risk Stratification   Activity Barriers None    Cardiac Risk Stratification High      6 Minute Walk:     6 Minute Walk    Row Name 09/22/15 1556         6 Minute Walk   Phase Initial     Distance 1620 feet     Walk Time 6 minutes     # of Rest Breaks 0     MPH 3.1     METS 3.2     RPE 11     VO2 Peak 11.04     Symptoms No     Resting HR 60 bpm     Resting BP 138/78     Max Ex. HR 98 bpm     Max Ex. BP 132/76     2 Minute Post BP 128/72        Initial Exercise Prescription:     Initial Exercise Prescription - 09/22/15 1500      Date of Initial Exercise RX and Referring Provider   Date 09/22/15   Referring Provider Jenkins Rouge MD     Treadmill   MPH 2.5   Grade 0   Minutes 10   METs 2.91     Bike   Level 0.5   Minutes 10   METs 2.26     NuStep   Level 3   Minutes 10   METs 2     Prescription Details   Frequency (times per week) 3   Duration Progress to 30 minutes of continuous aerobic without signs/symptoms of physical distress     Intensity   THRR 40-80% of Max Heartrate 59-118   Ratings of Perceived Exertion 11-13   Perceived Dyspnea 0-4     Progression   Progression Continue to progress workloads to maintain intensity without signs/symptoms of physical distress.     Resistance Training   Training Prescription Yes   Weight 2lbs   Reps 10-12      Perform Capillary Blood Glucose checks as needed.  Exercise Prescription Changes:   Exercise Comments:   Discharge Exercise Prescription (Final Exercise Prescription Changes):   Nutrition:  Target Goals: Understanding of nutrition guidelines, daily intake of sodium '1500mg'$ , cholesterol '200mg'$ , calories 30% from fat and 7% or less from saturated fats, daily to have 5 or more servings of fruits and vegetables.  Biometrics:     Pre Biometrics - 09/22/15 1601      Pre Biometrics   Waist Circumference 38 inches   Hip Circumference 38 inches   Waist to Hip Ratio  1 %   Triceps Skinfold 16 mm   % Body Fat 28.7 %   Grip Strength  39 kg   Flexibility 11.75 in   Single Leg Stand 13.31 seconds       Nutrition Therapy Plan and Nutrition Goals:   Nutrition Discharge: Nutrition Scores:   Nutrition Goals Re-Evaluation:   Psychosocial: Target Goals: Acknowledge presence or absence of depression, maximize coping skills, provide positive support system. Participant is able to verbalize types and ability to use techniques and skills needed for reducing stress and depression.  Initial Review & Psychosocial Screening:   Quality of Life Scores:     Quality of Life - 09/22/15 1602      Quality of Life Scores   Health/Function Pre 29 %   Socioeconomic Pre 29.17 %   Psych/Spiritual Pre 30 %   Family Pre 30 %   GLOBAL Pre 29.39 %      PHQ-9: Recent Review Flowsheet Data    Depression screen [facility] 2/9 01/17/2015 01/14/2014   Decreased Interest 0 0   Down, Depressed, Hopeless 0 0   PHQ - 2 Score 0 0      Psychosocial Evaluation and Intervention:   Psychosocial Re-Evaluation:   Vocational Rehabilitation: Provide vocational rehab assistance to qualifying candidates.   Vocational Rehab Evaluation & Intervention:     Vocational Rehab - 09/22/15 1442      Initial Vocational Rehab Evaluation & Intervention   Assessment shows need for Vocational Rehabilitation No      Education: Education Goals: Education classes will be provided on a weekly basis, covering required topics. Participant will state understanding/return demonstration of topics presented.  Learning Barriers/Preferences:     Learning Barriers/Preferences - 09/22/15 0850      Learning Barriers/Preferences   Learning Preferences Skilled Demonstration;Video;Written Material;Pictoral      Education Topics: Count Your Pulse:  -Group instruction provided by verbal instruction, demonstration, patient participation and written materials to support subject.  Instructors address importance of being able to find your pulse and how to count  your pulse when at home without a heart monitor.  Patients get hands on experience counting their pulse with staff help and individually.   Heart Attack, Angina, and Risk Factor Modification:  -Group instruction provided by verbal instruction, video, and written materials to support subject.  Instructors address signs and symptoms of angina and heart attacks.    Also discuss risk factors for heart disease and how to make changes to improve heart health risk factors.   Functional Fitness:  -Group instruction provided by verbal instruction, demonstration, patient participation, and written materials to support subject.  Instructors address safety measures for doing things around the house.  Discuss how to get up and down off the floor, how to pick things up properly, how to safely get out of a chair without assistance, and balance training.   Meditation and Mindfulness:  -Group instruction provided by verbal instruction, patient participation, and written materials to support subject.  Instructor addresses importance of mindfulness and meditation practice to help reduce stress and improve awareness.  Instructor also leads participants through a meditation exercise.    Stretching for Flexibility and Mobility:  -Group instruction provided by verbal instruction, patient participation, and written materials to support subject.  Instructors lead participants through series of stretches that are designed to increase flexibility thus improving mobility.  These stretches are additional exercise for major muscle groups that are typically performed during regular warm up and cool down.   Hands Only  CPR Anytime:  -Group instruction provided by verbal instruction, video, patient participation and written materials to support subject.  Instructors co-teach with AHA video for hands only CPR.  Participants get hands on experience with mannequins.   Nutrition I class: Heart Healthy Eating:  -Group instruction  provided by PowerPoint slides, verbal discussion, and written materials to support subject matter. The instructor gives an explanation and review of the Therapeutic Lifestyle Changes diet recommendations, which includes a discussion on lipid goals, dietary fat, sodium, fiber, plant stanol/sterol esters, sugar, and the components of a well-balanced, healthy diet.   Nutrition II class: Lifestyle Skills:  -Group instruction provided by PowerPoint slides, verbal discussion, and written materials to support subject matter. The instructor gives an explanation and review of label reading, grocery shopping for heart health, heart healthy recipe modifications, and ways to make healthier choices when eating out.   Diabetes Question & Answer:  -Group instruction provided by PowerPoint slides, verbal discussion, and written materials to support subject matter. The instructor gives an explanation and review of diabetes co-morbidities, pre- and post-prandial blood glucose goals, pre-exercise blood glucose goals, signs, symptoms, and treatment of hypoglycemia and hyperglycemia, and foot care basics.   Diabetes Blitz:  -Group instruction provided by PowerPoint slides, verbal discussion, and written materials to support subject matter. The instructor gives an explanation and review of the physiology behind type 1 and type 2 diabetes, diabetes medications and rational behind using different medications, pre- and post-prandial blood glucose recommendations and Hemoglobin A1c goals, diabetes diet, and exercise including blood glucose guidelines for exercising safely.    Portion Distortion:  -Group instruction provided by PowerPoint slides, verbal discussion, written materials, and food models to support subject matter. The instructor gives an explanation of serving size versus portion size, changes in portions sizes over the last 20 years, and what consists of a serving from each food group.   Stress Management:   -Group instruction provided by verbal instruction, video, and written materials to support subject matter.  Instructors review role of stress in heart disease and how to cope with stress positively.     Exercising on Your Own:  -Group instruction provided by verbal instruction, power point, and written materials to support subject.  Instructors discuss benefits of exercise, components of exercise, frequency and intensity of exercise, and end points for exercise.  Also discuss use of nitroglycerin and activating EMS.  Review options of places to exercise outside of rehab.  Review guidelines for sex with heart disease.   Cardiac Drugs I:  -Group instruction provided by verbal instruction and written materials to support subject.  Instructor reviews cardiac drug classes: antiplatelets, anticoagulants, beta blockers, and statins.  Instructor discusses reasons, side effects, and lifestyle considerations for each drug class.   Cardiac Drugs II:  -Group instruction provided by verbal instruction and written materials to support subject.  Instructor reviews cardiac drug classes: angiotensin converting enzyme inhibitors (ACE-I), angiotensin II receptor blockers (ARBs), nitrates, and calcium channel blockers.  Instructor discusses reasons, side effects, and lifestyle considerations for each drug class.   Anatomy and Physiology of the Circulatory System:  -Group instruction provided by verbal instruction, video, and written materials to support subject.  Reviews functional anatomy of heart, how it relates to various diagnoses, and what role the heart plays in the overall system.   Knowledge Questionnaire Score:     Knowledge Questionnaire Score - 09/22/15 1554      Knowledge Questionnaire Score   Pre Score 19/24  Core Components/Risk Factors/Patient Goals at Admission:     Personal Goals and Risk Factors at Admission - 09/22/15 0851      Core Components/Risk Factors/Patient Goals on  Admission    Weight Management Yes;Weight Loss   Intervention Weight Management: Develop a combined nutrition and exercise program designed to reach desired caloric intake, while maintaining appropriate intake of nutrient and fiber, sodium and fats, and appropriate energy expenditure required for the weight goal.;Weight Management: Provide education and appropriate resources to help participant work on and attain dietary goals.;Weight Management/Obesity: Establish reasonable short term and long term weight goals.;Obesity: Provide education and appropriate resources to help participant work on and attain dietary goals.   Expected Outcomes Short Term: Continue to assess and modify interventions until short term weight is achieved;Weight Maintenance: Understanding of the daily nutrition guidelines, which includes 25-35% calories from fat, 7% or less cal from saturated fats, less than '200mg'$  cholesterol, less than 1.5gm of sodium, & 5 or more servings of fruits and vegetables daily;Long Term: Adherence to nutrition and physical activity/exercise program aimed toward attainment of established weight goal;Weight Loss: Understanding of general recommendations for a balanced deficit meal plan, which promotes 1-2 lb weight loss per week and includes a negative energy balance of [phone] kcal/d;Understanding recommendations for meals to include 15-35% energy as protein, 25-35% energy from fat, 35-60% energy from carbohydrates, less than '200mg'$  of dietary cholesterol, 20-35 gm of total fiber daily;Understanding of distribution of calorie intake throughout the day with the consumption of 4-5 meals/snacks   Increase Strength and Stamina Yes   Intervention Provide advice, education, support and counseling about physical activity/exercise needs.;Develop an individualized exercise prescription for aerobic and resistive training based on initial evaluation findings, risk stratification, comorbidities and participant's personal  goals.   Expected Outcomes Achievement of increased cardiorespiratory fitness and enhanced flexibility, muscular endurance and strength shown through measurements of functional capacity and personal statement of participant.   Diabetes Yes   Intervention Provide education about signs/symptoms and action to take for hypo/hyperglycemia.;Provide education about proper nutrition, including hydration, and aerobic/resistive exercise prescription along with prescribed medications to achieve blood glucose in normal ranges: Fasting glucose 65-99 mg/dL   Expected Outcomes Long Term: Attainment of HbA1C < 7%.;Short Term: Participant verbalizes understanding of the signs/symptoms and immediate care of hyper/hypoglycemia, proper foot care and importance of medication, aerobic/resistive exercise and nutrition plan for blood glucose control.   Hypertension Yes   Intervention Provide education on lifestyle modifcations including regular physical activity/exercise, weight management, moderate sodium restriction and increased consumption of fresh fruit, vegetables, and low fat dairy, alcohol moderation, and smoking cessation.;Monitor prescription use compliance.   Expected Outcomes Short Term: Continued assessment and intervention until BP is < 140/71m HG in hypertensive participants. < 130/852mHG in hypertensive participants with diabetes, heart failure or chronic kidney disease.;Long Term: Maintenance of blood pressure at goal levels.   Lipids Yes   Intervention Provide education and support for participant on nutrition & aerobic/resistive exercise along with prescribed medications to achieve LDL '70mg'$ , HDL >'40mg'$ .   Expected Outcomes Short Term: Participant states understanding of desired cholesterol values and is compliant with medications prescribed. Participant is following exercise prescription and nutrition guidelines.;Long Term: Cholesterol controlled with medications as prescribed, with individualized exercise RX  and with personalized nutrition plan. Value goals: LDL < '70mg'$ , HDL > 40 mg.   Personal Goal Other Yes   Personal Goal build upper body strength, know what to do safely. Be in overall good health and be able to live  without restrictions/limitations   Intervention Provide exercise programming that will assist with increasing strength/endurance and overall exercise capacity and confidence   Expected Outcomes Pt will be able to have better understanding on what to do per heart condition, and build strength. Overall be in good health      Core Components/Risk Factors/Patient Goals Review:    Core Components/Risk Factors/Patient Goals at Discharge (Final Review):    ITP Comments:     ITP Comments    Row Name 09/22/15 0846           ITP Comments Dr. Fransico Him, Medical Director          Comments: Pt in today from 0800 to 57 for cardiac rehab orientation. As a part of orientation to cardiac rehab, pt completed a 6 minute walk test.  Pt tolerated well with no complaints of chest pain or sob.  Monitor showed SR with sinus arrhthymias previous 12 lead ekg shows incomplete R bundle.  Pt is excited to participate in cardiac rehab. Cherre Huger, BSN

## 2015-09-26 ENCOUNTER — Encounter (HOSPITAL_COMMUNITY)
Admission: RE | Admit: 2015-09-26 | Discharge: 2015-09-26 | Disposition: A | Discharge status: Home / Self Care | Payer: 59 | Source: Ambulatory Visit | Attending: Cardiovascular Disease | Admitting: Cardiovascular Disease

## 2015-09-26 ENCOUNTER — Encounter (HOSPITAL_COMMUNITY): Payer: 59

## 2015-09-26 DIAGNOSIS — Z951 Presence of aortocoronary bypass graft: Secondary | ICD-10-CM | POA: Diagnosis not present

## 2015-09-26 LAB — GLUCOSE, CAPILLARY
Glucose-Capillary: 206 mg/dL — ABNORMAL HIGH (ref 65–99)
Glucose-Capillary: 68 mg/dL (ref 65–99)
Glucose-Capillary: 133 mg/dL — ABNORMAL HIGH (ref 65–99)

## 2015-09-26 NOTE — Progress Notes (Addendum)
Daily Session Note  Patient Details  Name: Clifford [name] MRN: [id number] Date of Birth: January 15, 1946 Referring Provider:   April Manson CARDIAC REHAB PHASE II ORIENTATION from 09/22/2015 in [place]  Referring Provider  Jenkins Rouge MD      Encounter Date: 09/26/2015  Check In:     Session Check In - 09/26/15 0835      Check-In   Location [facility]   Staff Present Su Hilt, MS, ACSM RCEP, Exercise Physiologist;Olinty Beulah, MS, ACSM CEP, Exercise Physiologist;Joann Rion, RN, BSN;Amber Fair, MS, ACSM RCEP, Exercise Physiologist   Supervising physician immediately available to respond to emergencies [facility] immediately available   Physician(s) Dr. Marily Memos   Medication changes reported     No   Fall or balance concerns reported    No   Warm-up and Cool-down Performed as group-led instruction   Resistance Training Performed Yes   VAD Patient? No     Pain Assessment   Currently in Pain? No/denies   Multiple Pain Sites No      Capillary Blood Glucose: No results found for this or any previous visit (from the past 24 hour(s)).   Goals Met:  Exercise tolerated well  Goals Unmet:  Post exercise hypoglycemia:  CBG-68. Pt asymptomatic. Pt is diet controlled type II diabetic. Pt had eaten raisin bran this morning.  Pt given banana.  Pt instructed to add protein to his morning meal especially on exercise days. Pt also instructed to bring his meter to next scheduled cardiac rehab session.  Comments: Pt started cardiac rehab today.  Pt tolerated light exercise without difficulty. VSS, telemetry-sinus rhythm, asymptomatic.  Medication list reconciled. Pt denies barriers to medicaiton compliance.  PSYCHOSOCIAL ASSESSMENT:  PHQ-0. Pt exhibits positive coping skills, hopeful outlook with supportive family. No psychosocial needs identified at this time, no psychosocial interventions necessary.    Pt enjoys reading,  walking and socializing.  Pt walks daily at shopping places for exercise.  Pt advised to not exercise at home on days he participates in cardiac rehab.  Pt is looking forward to increasing his upper body strength and knowing his limitations and home exercise guidelines.   Pt oriented to exercise equipment and routine.    Understanding verbalized.   Dr. Fransico Him is Medical Director for Cardiac Rehab at [facility].

## 2015-09-28 ENCOUNTER — Encounter (HOSPITAL_COMMUNITY)
Admission: RE | Admit: 2015-09-28 | Discharge: 2015-09-28 | Disposition: A | Discharge status: Home / Self Care | Payer: 59 | Source: Ambulatory Visit | Attending: Cardiovascular Disease | Admitting: Cardiovascular Disease

## 2015-09-28 ENCOUNTER — Encounter (HOSPITAL_COMMUNITY): Payer: 59

## 2015-09-28 DIAGNOSIS — Z951 Presence of aortocoronary bypass graft: Secondary | ICD-10-CM

## 2015-09-28 LAB — GLUCOSE, CAPILLARY
GLUCOSE-CAPILLARY: 102 mg/dL — AB (ref 65–99)
GLUCOSE-CAPILLARY: 117 mg/dL — AB (ref 65–99)

## 2015-09-28 NOTE — Progress Notes (Signed)
Pt brought home glucometer to cardiac rehab.   Cardiac rehab glucometer CBG-117, home meter-120.  Pt reassured.  Pt will continue to monitor blood sugar at home as directed by his PCP. Pt currently checks BID.  Pt will report home CBG at future cardiac rehab sessions.

## 2015-09-30 ENCOUNTER — Encounter (HOSPITAL_COMMUNITY)
Admission: RE | Admit: 2015-09-30 | Discharge: 2015-09-30 | Disposition: A | Discharge status: Home / Self Care | Payer: 59 | Source: Ambulatory Visit | Attending: Cardiovascular Disease | Admitting: Cardiovascular Disease

## 2015-09-30 ENCOUNTER — Encounter (HOSPITAL_COMMUNITY): Payer: 59

## 2015-09-30 DIAGNOSIS — Z951 Presence of aortocoronary bypass graft: Secondary | ICD-10-CM | POA: Diagnosis not present

## 2015-10-03 ENCOUNTER — Encounter (HOSPITAL_COMMUNITY)
Admission: RE | Admit: 2015-10-03 | Discharge: 2015-10-03 | Disposition: A | Discharge status: Home / Self Care | Payer: 59 | Source: Ambulatory Visit | Attending: Cardiovascular Disease | Admitting: Cardiovascular Disease

## 2015-10-03 ENCOUNTER — Encounter (HOSPITAL_COMMUNITY): Payer: 59

## 2015-10-03 DIAGNOSIS — Z951 Presence of aortocoronary bypass graft: Secondary | ICD-10-CM | POA: Diagnosis not present

## 2015-10-05 ENCOUNTER — Encounter: Payer: Self-pay | Admitting: Cardiovascular Disease

## 2015-10-05 ENCOUNTER — Encounter (HOSPITAL_COMMUNITY): Payer: 59

## 2015-10-05 ENCOUNTER — Encounter (HOSPITAL_COMMUNITY)
Admission: RE | Admit: 2015-10-05 | Discharge: 2015-10-05 | Disposition: A | Discharge status: Home / Self Care | Payer: 59 | Source: Ambulatory Visit | Attending: Cardiovascular Disease | Admitting: Cardiovascular Disease

## 2015-10-05 DIAGNOSIS — Z951 Presence of aortocoronary bypass graft: Secondary | ICD-10-CM | POA: Diagnosis not present

## 2015-10-07 ENCOUNTER — Encounter (HOSPITAL_COMMUNITY): Payer: 59

## 2015-10-07 ENCOUNTER — Encounter (HOSPITAL_COMMUNITY)
Admission: RE | Admit: 2015-10-07 | Discharge: 2015-10-07 | Disposition: A | Discharge status: Home / Self Care | Payer: 59 | Source: Ambulatory Visit | Attending: Cardiovascular Disease | Admitting: Cardiovascular Disease

## 2015-10-07 DIAGNOSIS — Z951 Presence of aortocoronary bypass graft: Secondary | ICD-10-CM | POA: Diagnosis not present

## 2015-10-10 ENCOUNTER — Encounter (HOSPITAL_COMMUNITY): Payer: 59

## 2015-10-10 ENCOUNTER — Encounter (HOSPITAL_COMMUNITY)
Admission: RE | Admit: 2015-10-10 | Discharge: 2015-10-10 | Disposition: A | Discharge status: Home / Self Care | Payer: 59 | Source: Ambulatory Visit | Attending: Cardiovascular Disease | Admitting: Cardiovascular Disease

## 2015-10-10 DIAGNOSIS — Z951 Presence of aortocoronary bypass graft: Secondary | ICD-10-CM

## 2015-10-11 NOTE — Progress Notes (Signed)
Cardiac Individual Treatment Plan  Patient Details  Name: Clifford [name] MRN: [id number] Date of Birth: [birth year] Referring Provider:   April Manson CARDIAC REHAB PHASE II ORIENTATION from 09/22/2015 in [place]  Referring Provider  Jenkins Rouge MD      Initial Encounter Date:  [place] PHASE II ORIENTATION from 09/22/2015 in [facility]  Date  09/22/15  Referring Provider  Jenkins Rouge MD      Visit Diagnosis: 07/08/15 S/P CABG x 3  Patient's Home Medications on Admission:  Current Outpatient Prescriptions:  .  Ascorbic Acid (VITAMIN C PO), Take 1 tablet by mouth daily., Disp: , Rfl:  .  aspirin EC 81 MG tablet, Take 81 mg by mouth once. , Disp: , Rfl:  .  blood glucose meter kit and supplies, Dispense based on patient and insurance preference. Use up to four times daily as directed. (FOR ICD-9 250.00, 250.01)., Disp: 1 each, Rfl: 0 .  Metoprolol Tartrate 37.5 MG TABS, Take 37.5 mg by mouth 2 (two) times daily., Disp: 60 tablet, Rfl: 3 .  Multiple Vitamin (MULTIVITAMIN) tablet, Take 1 tablet by mouth daily.  , Disp: , Rfl:  .  ONE TOUCH ULTRA TEST test strip, 1 each by Other route 2 (two) times daily. Use to check blood sugars twice a day Dx E11.9, Disp: 100 each, Rfl: 5 .  ONETOUCH DELICA LANCETS 91M MISC, Use to help check blood sugars twice a day Dx e11.9, Disp: 100 each, Rfl: 5 .  VYTORIN 10-80 MG tablet, Take 1 tablet by mouth  daily, Disp: 90 tablet, Rfl: 1  Past Medical History: Past Medical History:  Diagnosis Date  . CAD (coronary artery disease) 2005   a. Previous stents RCA and circ, Cath 2005 patient with 70% PDA  //  b. [place] 5/17 >> LHC oLCx 50, pLAD 50, mLAD 95, RPDA 80, normal LVEF >> s/p CABG  . Carotid artery disease ([place])    a. Carotid US 5/17 bilat ICA 1-39%  . Cataract    replace 11/2012  . Diabetes mellitus    diet controlled  . History of detached retina repair  11-06-2006   [facility]  . History of echocardiogram    a. EF 55% to 60%. Wall motion was normal, Gr 1 DD, Trivial MR/TR  . History of hematuria   . History of prostate cancer 03/2005   s/p prostatectomy  . Hyperlipidemia   . Hypertension     Tobacco Use: History  Smoking Status  . Former Smoker  . Quit date: 02/27/1983  Smokeless Tobacco  . Never Used    Labs: Recent Review Flowsheet Data    Labs for ITP Cardiac and Pulmonary Rehab Latest Ref Rng & Units 07/08/2015 07/08/2015 07/08/2015 07/09/2015 09/12/2015   Cholestrol 0 - 200 mg/dL - - - - -   LDLCALC 0 - 99 mg/dL - - - - -   HDL >40 mg/dL - - - - -   Trlycerides <150 mg/dL - - - - -   Hemoglobin A1c 4.6 - 6.5 % - - - - 6.2   PHART 7.350 - 7.450 7.304(L) - 7.343(L) - -   PCO2ART 35.0 - 45.0 mmHg 46.0(H) - 38.6 - -   HCO3 20.0 - 24.0 mEq/L 22.7 - 20.8 - -   TCO2 0 - 100 mmol/L _0 -   ACIDBASEDEF 0.0 - 2.0 mmol/L 3.0(H) - 4.0(H) - -   O2SAT %  93.0 - 93.0 - -      Capillary Blood Glucose: Lab Results  Component Value Date   GLUCAP 117 (H) 09/28/2015   GLUCAP 102 (H) 09/28/2015   GLUCAP 133 (H) 09/26/2015   GLUCAP 68 09/26/2015   GLUCAP 206 (H) 09/26/2015     Exercise Target Goals:    Exercise Program Goal: Individual exercise prescription set with THRR, safety & activity barriers. Participant demonstrates ability to understand and report RPE using BORG scale, to self-measure pulse accurately, and to acknowledge the importance of the exercise prescription.  Exercise Prescription Goal: Starting with aerobic activity 30 plus minutes a day, 3 days per week for initial exercise prescription. Provide home exercise prescription and guidelines that participant acknowledges understanding prior to discharge.  Activity Barriers & Risk Stratification:     Activity Barriers & Cardiac Risk Stratification - 09/22/15 0850      Activity Barriers & Cardiac Risk Stratification   Activity Barriers None   Cardiac Risk  Stratification High      6 Minute Walk:     6 Minute Walk    Row Name 09/22/15 1556         6 Minute Walk   Phase Initial     Distance 1620 feet     Walk Time 6 minutes     # of Rest Breaks 0     MPH 3.1     METS 3.2     RPE 11     VO2 Peak 11.04     Symptoms No     Resting HR 60 bpm     Resting BP 138/78     Max Ex. HR 98 bpm     Max Ex. BP 132/76     2 Minute Post BP 128/72        Initial Exercise Prescription:     Initial Exercise Prescription - 09/22/15 1500      Date of Initial Exercise RX and Referring Provider   Date 09/22/15   Referring Provider Jenkins Rouge MD     Treadmill   MPH 2.5   Grade 0   Minutes 10   METs 2.91     Bike   Level 0.5   Minutes 10   METs 2.26     NuStep   Level 3   Minutes 10   METs 2     Prescription Details   Frequency (times per week) 3   Duration Progress to 30 minutes of continuous aerobic without signs/symptoms of physical distress     Intensity   THRR 40-80% of Max Heartrate 59-118   Ratings of Perceived Exertion 11-13   Perceived Dyspnea 0-4     Progression   Progression Continue to progress workloads to maintain intensity without signs/symptoms of physical distress.     Resistance Training   Training Prescription Yes   Weight 2lbs   Reps 10-12      Perform Capillary Blood Glucose checks as needed.  Exercise Prescription Changes:      Exercise Prescription Changes    Row Name 10/03/15 1000             Exercise Review   Progression Yes         Response to Exercise   Blood Pressure (Admit) 110/70       Blood Pressure (Exercise) 134/72       Blood Pressure (Exit) 112/70       Heart Rate (Admit) 59 bpm       Heart Rate (  Exercise) 117 bpm       Heart Rate (Exit) 68 bpm       Perceived Dyspnea (Exercise) 11       Duration Progress to 30 minutes of continuous aerobic without signs/symptoms of physical distress       Intensity THRR unchanged         Progression   Progression Continue  to progress workloads to maintain intensity without signs/symptoms of physical distress.       Average METs 3.3         Resistance Training   Training Prescription Yes       Weight 2lbs       Reps 10-12         Treadmill   MPH 2.5       Grade 0       Minutes 10       METs 2.91         Bike   Level 0.5       Minutes 10       METs 2.21         NuStep   Level 3       Minutes 10       METs 3.6          Exercise Comments:      Exercise Comments    Row Name 10/03/15 1048           Exercise Comments Reviewed METs and goals. Pt is tolerating exercise well; will continue to monitor exercise progression.          Discharge Exercise Prescription (Final Exercise Prescription Changes):     Exercise Prescription Changes - 10/03/15 1000      Exercise Review   Progression Yes     Response to Exercise   Blood Pressure (Admit) 110/70   Blood Pressure (Exercise) 134/72   Blood Pressure (Exit) 112/70   Heart Rate (Admit) 59 bpm   Heart Rate (Exercise) 117 bpm   Heart Rate (Exit) 68 bpm   Perceived Dyspnea (Exercise) 11   Duration Progress to 30 minutes of continuous aerobic without signs/symptoms of physical distress   Intensity THRR unchanged     Progression   Progression Continue to progress workloads to maintain intensity without signs/symptoms of physical distress.   Average METs 3.3     Resistance Training   Training Prescription Yes   Weight 2lbs   Reps 10-12     Treadmill   MPH 2.5   Grade 0   Minutes 10   METs 2.91     Bike   Level 0.5   Minutes 10   METs 2.21     NuStep   Level 3   Minutes 10   METs 3.6      Nutrition:  Target Goals: Understanding of nutrition guidelines, daily intake of sodium <1561m, cholesterol <2063m calories 30% from fat and 7% or less from saturated fats, daily to have 5 or more servings of fruits and vegetables.  Biometrics:     Pre Biometrics - 09/22/15 1601      Pre Biometrics   Waist Circumference 38  inches   Hip Circumference 38 inches   Waist to Hip Ratio 1 %   Triceps Skinfold 16 mm   % Body Fat 28.7 %   Grip Strength 39 kg   Flexibility 11.75 in   Single Leg Stand 13.31 seconds       Nutrition Therapy Plan and Nutrition Goals:  Nutrition Therapy & Goals - 09/26/15 1055      Nutrition Therapy   Diet Carb Modified, Therapeutic Lifestyle Change     Personal Nutrition Goals   Personal Goal #1 1-2 lb wt loss/week to a goal wt loss of 6-24 lb at graduation from [place], educate and counsel regarding individualized specific dietary modifications aiming towards targeted core components such as weight, hypertension, lipid management, diabetes, heart failure and other comorbidities.   Expected Outcomes Short Term Goal: Understand basic principles of dietary content, such as calories, fat, sodium, cholesterol and nutrients.;Long Term Goal: Adherence to prescribed nutrition plan.      Nutrition Discharge: Nutrition Scores:     Nutrition Assessments - 09/26/15 1056      MEDFICTS Scores   Pre Score 6  will verify score with pt      Nutrition Goals Re-Evaluation:   Psychosocial: Target Goals: Acknowledge presence or absence of depression, maximize coping skills, provide positive support system. Participant is able to verbalize types and ability to use techniques and skills needed for reducing stress and depression.  Initial Review & Psychosocial Screening:     Initial Psych Review & Screening - 09/26/15 [place]? Yes     Barriers   Psychosocial barriers to participate in program The patient should benefit from training in stress management and relaxation.;There are no identifiable barriers or psychosocial needs.     Screening Interventions   Interventions Encouraged to exercise      Quality of Life Scores:     Quality of Life - 09/26/15 0921      Quality of Life  Scores   GLOBAL Pre --  great QOL scores, pt exhibits hopeful outlook with good coping skills       PHQ-9: Recent Review Flowsheet Data    Depression screen [facility] 2/9 09/26/2015 01/17/2015 01/14/2014   Decreased Interest 0 0 0   Down, Depressed, Hopeless 0 0 0   PHQ - 2 Score 0 0 0      Psychosocial Evaluation and Intervention:     Psychosocial Evaluation - 10/11/15 1642      Psychosocial Evaluation & Interventions   Interventions Stress management education;Relaxation education;Encouraged to exercise with the program and follow exercise prescription   Continued Psychosocial Services Needed No      Psychosocial Re-Evaluation:   Vocational Rehabilitation: Provide vocational rehab assistance to qualifying candidates.   Vocational Rehab Evaluation & Intervention:     Vocational Rehab - 09/22/15 1442      Initial Vocational Rehab Evaluation & Intervention   Assessment shows need for Vocational Rehabilitation No      Education: Education Goals: Education classes will be provided on a weekly basis, covering required topics. Participant will state understanding/return demonstration of topics presented.  Learning Barriers/Preferences:     Learning Barriers/Preferences - 09/22/15 0850      Learning Barriers/Preferences   Learning Preferences Skilled Demonstration;Video;Written Material;Pictoral      Education Topics: Count Your Pulse:  -Group instruction provided by verbal instruction, demonstration, patient participation and written materials to support subject.  Instructors address importance of being able to find your pulse and how to count your pulse when at home without a heart monitor.  Patients get hands on experience counting their pulse with staff help and individually.   Heart Attack, Angina, and Risk Factor Modification:  -Group instruction provided by verbal instruction, video, and  written materials to support subject.  Instructors address signs and  symptoms of angina and heart attacks.    Also discuss risk factors for heart disease and how to make changes to improve heart health risk factors.   Functional Fitness:  -Group instruction provided by verbal instruction, demonstration, patient participation, and written materials to support subject.  Instructors address safety measures for doing things around the house.  Discuss how to get up and down off the floor, how to pick things up properly, how to safely get out of a chair without assistance, and balance training.   Meditation and Mindfulness:  -Group instruction provided by verbal instruction, patient participation, and written materials to support subject.  Instructor addresses importance of mindfulness and meditation practice to help reduce stress and improve awareness.  Instructor also leads participants through a meditation exercise.    Stretching for Flexibility and Mobility:  -Group instruction provided by verbal instruction, patient participation, and written materials to support subject.  Instructors lead participants through series of stretches that are designed to increase flexibility thus improving mobility.  These stretches are additional exercise for major muscle groups that are typically performed during regular warm up and cool down.   Hands Only CPR Anytime:  -Group instruction provided by verbal instruction, video, patient participation and written materials to support subject.  Instructors co-teach with AHA video for hands only CPR.  Participants get hands on experience with mannequins.   Nutrition I class: Heart Healthy Eating:  -Group instruction provided by PowerPoint slides, verbal discussion, and written materials to support subject matter. The instructor gives an explanation and review of the Therapeutic Lifestyle Changes diet recommendations, which includes a discussion on lipid goals, dietary fat, sodium, fiber, plant stanol/sterol esters, sugar, and the  components of a well-balanced, healthy diet. Flowsheet Row CARDIAC REHAB PHASE II EXERCISE from 10/12/2015 in [place]  Date  09/27/15  Educator  RD  Instruction Review Code  2- meets goals/outcomes      Nutrition II class: Lifestyle Skills:  -Group instruction provided by PowerPoint slides, verbal discussion, and written materials to support subject matter. The instructor gives an explanation and review of label reading, grocery shopping for heart health, heart healthy recipe modifications, and ways to make healthier choices when eating out. Flowsheet Row CARDIAC REHAB PHASE II EXERCISE from 10/12/2015 in [place]  Date  10/04/15  Educator  RD  Instruction Review Code  2- meets goals/outcomes      Diabetes Question & Answer:  -Group instruction provided by PowerPoint slides, verbal discussion, and written materials to support subject matter. The instructor gives an explanation and review of diabetes co-morbidities, pre- and post-prandial blood glucose goals, pre-exercise blood glucose goals, signs, symptoms, and treatment of hypoglycemia and hyperglycemia, and foot care basics. Flowsheet Row CARDIAC REHAB PHASE II EXERCISE from 10/12/2015 in [place]  Date  10/07/15  Educator  RD  Instruction Review Code  2- meets goals/outcomes      Diabetes Blitz:  -Group instruction provided by PowerPoint slides, verbal discussion, and written materials to support subject matter. The instructor gives an explanation and review of the physiology behind type 1 and type 2 diabetes, diabetes medications and rational behind using different medications, pre- and post-prandial blood glucose recommendations and Hemoglobin A1c goals, diabetes diet, and exercise including blood glucose guidelines for exercising safely.  Flowsheet Row CARDIAC REHAB PHASE II EXERCISE from 10/12/2015 in [place]  REHAB  Date  10/11/15  Educator  RD  Instruction Review Code  2- meets goals/outcomes      Portion Distortion:  -Group instruction provided by PowerPoint slides, verbal discussion, written materials, and food models to support subject matter. The instructor gives an explanation of serving size versus portion size, changes in portions sizes over the last 20 years, and what consists of a serving from each food group. Flowsheet Row CARDIAC REHAB PHASE II EXERCISE from 10/12/2015 in [place]  Date  10/05/15  Educator  RD  Instruction Review Code  2- meets goals/outcomes      Stress Management:  -Group instruction provided by verbal instruction, video, and written materials to support subject matter.  Instructors review role of stress in heart disease and how to cope with stress positively.   Flowsheet Row CARDIAC REHAB PHASE II EXERCISE from 10/12/2015 in [place]  Date  09/28/15  Instruction Review Code  2- meets goals/outcomes      Exercising on Your Own:  -Group instruction provided by verbal instruction, power point, and written materials to support subject.  Instructors discuss benefits of exercise, components of exercise, frequency and intensity of exercise, and end points for exercise.  Also discuss use of nitroglycerin and activating EMS.  Review options of places to exercise outside of rehab.  Review guidelines for sex with heart disease.   Cardiac Drugs I:  -Group instruction provided by verbal instruction and written materials to support subject.  Instructor reviews cardiac drug classes: antiplatelets, anticoagulants, beta blockers, and statins.  Instructor discusses reasons, side effects, and lifestyle considerations for each drug class. Flowsheet Row CARDIAC REHAB PHASE II EXERCISE from 10/12/2015 in [place]  Date  10/12/15  Educator  pharmD  Instruction Review Code   2- meets goals/outcomes      Cardiac Drugs II:  -Group instruction provided by verbal instruction and written materials to support subject.  Instructor reviews cardiac drug classes: angiotensin converting enzyme inhibitors (ACE-I), angiotensin II receptor blockers (ARBs), nitrates, and calcium channel blockers.  Instructor discusses reasons, side effects, and lifestyle considerations for each drug class.   Anatomy and Physiology of the Circulatory System:  -Group instruction provided by verbal instruction, video, and written materials to support subject.  Reviews functional anatomy of heart, how it relates to various diagnoses, and what role the heart plays in the overall system.   Knowledge Questionnaire Score:     Knowledge Questionnaire Score - 09/26/15 1055      Knowledge Questionnaire Score   Pre Score 19/24     DM 14/15      Core Components/Risk Factors/Patient Goals at Admission:     Personal Goals and Risk Factors at Admission - 09/22/15 0851      Core Components/Risk Factors/Patient Goals on Admission    Weight Management Yes;Weight Loss   Intervention Weight Management: Develop a combined nutrition and exercise program designed to reach desired caloric intake, while maintaining appropriate intake of nutrient and fiber, sodium and fats, and appropriate energy expenditure required for the weight goal.;Weight Management: Provide education and appropriate resources to help participant work on and attain dietary goals.;Weight Management/Obesity: Establish reasonable short term and long term weight goals.;Obesity: Provide education and appropriate resources to help participant work on and attain dietary goals.   Expected Outcomes Short Term: Continue to assess and modify interventions until short term weight is achieved;Weight Maintenance: Understanding of the daily nutrition guidelines, which includes 25-35%  calories from fat, 7% or less cal from saturated fats, less than 228m  cholesterol, less than 1.5gm of sodium, & 5 or more servings of fruits and vegetables daily;Long Term: Adherence to nutrition and physical activity/exercise program aimed toward attainment of established weight goal;Weight Loss: Understanding of general recommendations for a balanced deficit meal plan, which promotes 1-2 lb weight loss per week and includes a negative energy balance of [phone] kcal/d;Understanding recommendations for meals to include 15-35% energy as protein, 25-35% energy from fat, 35-60% energy from carbohydrates, less than 2063mof dietary cholesterol, 20-35 gm of total fiber daily;Understanding of distribution of calorie intake throughout the day with the consumption of 4-5 meals/snacks   Increase Strength and Stamina Yes   Intervention Provide advice, education, support and counseling about physical activity/exercise needs.;Develop an individualized exercise prescription for aerobic and resistive training based on initial evaluation findings, risk stratification, comorbidities and participant's personal goals.   Expected Outcomes Achievement of increased cardiorespiratory fitness and enhanced flexibility, muscular endurance and strength shown through measurements of functional capacity and personal statement of participant.   Diabetes Yes   Intervention Provide education about signs/symptoms and action to take for hypo/hyperglycemia.;Provide education about proper nutrition, including hydration, and aerobic/resistive exercise prescription along with prescribed medications to achieve blood glucose in normal ranges: Fasting glucose 65-99 mg/dL   Expected Outcomes Long Term: Attainment of HbA1C < 7%.;Short Term: Participant verbalizes understanding of the signs/symptoms and immediate care of hyper/hypoglycemia, proper foot care and importance of medication, aerobic/resistive exercise and nutrition plan for blood glucose control.   Hypertension Yes   Intervention Provide education on  lifestyle modifcations including regular physical activity/exercise, weight management, moderate sodium restriction and increased consumption of fresh fruit, vegetables, and low fat dairy, alcohol moderation, and smoking cessation.;Monitor prescription use compliance.   Expected Outcomes Short Term: Continued assessment and intervention until BP is < 140/9033mG in hypertensive participants. < 130/65m19m in hypertensive participants with diabetes, heart failure or chronic kidney disease.;Long Term: Maintenance of blood pressure at goal levels.   Lipids Yes   Intervention Provide education and support for participant on nutrition & aerobic/resistive exercise along with prescribed medications to achieve LDL <70mg7mL >40mg.8mxpected Outcomes Short Term: Participant states understanding of desired cholesterol values and is compliant with medications prescribed. Participant is following exercise prescription and nutrition guidelines.;Long Term: Cholesterol controlled with medications as prescribed, with individualized exercise RX and with personalized nutrition plan. Value goals: LDL < 70mg, 31m> 40 mg.   Personal Goal Other Yes   Personal Goal build upper body strength, know what to do safely. Be in overall good health and be able to live without restrictions/limitations   Intervention Provide exercise programming that will assist with increasing strength/endurance and overall exercise capacity and confidence   Expected Outcomes Pt will be able to have better understanding on what to do per heart condition, and build strength. Overall be in good health      Core Components/Risk Factors/Patient Goals Review:      Goals and Risk Factor Review    Row Name 10/03/15 1049             Core Components/Risk Factors/Patient Goals Review   Personal Goals Review Increase Strength and Stamina       Review Very little difference with upper body strength.  Have noticed an increased strength and stamina in  heart muscle       Expected Outcomes Pt upper body strength will get stronger and cardiovascular fitness  will continue to improve          Core Components/Risk Factors/Patient Goals at Discharge (Final Review):      Goals and Risk Factor Review - 10/03/15 1049      Core Components/Risk Factors/Patient Goals Review   Personal Goals Review Increase Strength and Stamina   Review Very little difference with upper body strength.  Have noticed an increased strength and stamina in heart muscle   Expected Outcomes Pt upper body strength will get stronger and cardiovascular fitness will continue to improve      ITP Comments:     ITP Comments    Row Name 09/22/15 0846 09/30/15 0848         ITP Comments Dr. Fransico Him, Medical Director 09/30/15 Attended Hypertension education class. Meets expected goals/outcomes.         Comments: Pt is making expected progress toward personal goals after completing 8 sessions. Recommend continued exercise and life style modification education including  stress management and relaxation techniques to decrease cardiac risk profile.

## 2015-10-12 ENCOUNTER — Encounter (HOSPITAL_COMMUNITY)
Admission: RE | Admit: 2015-10-12 | Discharge: 2015-10-12 | Disposition: A | Discharge status: Home / Self Care | Payer: 59 | Source: Ambulatory Visit | Attending: Cardiovascular Disease | Admitting: Cardiovascular Disease

## 2015-10-12 ENCOUNTER — Encounter (HOSPITAL_COMMUNITY): Payer: 59

## 2015-10-12 DIAGNOSIS — Z951 Presence of aortocoronary bypass graft: Secondary | ICD-10-CM | POA: Diagnosis not present

## 2015-10-14 ENCOUNTER — Encounter (HOSPITAL_COMMUNITY): Payer: 59

## 2015-10-14 ENCOUNTER — Encounter (HOSPITAL_COMMUNITY)
Admission: RE | Admit: 2015-10-14 | Discharge: 2015-10-14 | Disposition: A | Discharge status: Home / Self Care | Payer: 59 | Source: Ambulatory Visit | Attending: Cardiovascular Disease | Admitting: Cardiovascular Disease

## 2015-10-14 DIAGNOSIS — Z951 Presence of aortocoronary bypass graft: Secondary | ICD-10-CM | POA: Diagnosis not present

## 2015-10-16 NOTE — Progress Notes (Signed)
Cardiology Office Note:    Date:  10/19/2015   ID:  Clifford [name], Clifford [name] 11-21-1945, MRN [id number]  PCP:  Hoyt Koch, MD  Cardiologist:  Dr. Jenkins Rouge   Electrophysiologist:  n/a  Referring MD: Hoyt Koch, *   Post CABG F/U  History of Present Illness:     [name] is a 70 y.o. male with a hx of CAD status post prior PCI in 1995, 2000 and 2002, diabetes, HTN, HL, prostate CA status post prostatectomy.    Admitted 5/6-5/18 with unstable angina. Cardiac enzymes remained negative. Cardiac catheterization demonstrated 3 vessel CAD and CABG was recommended. He was seen by Dr. Servando Snare and underwent CABG with LIMA-LAD, SVG-PLVB, SVG-DX. Postoperative course was notable for volume excess as well as worsening creatinine. Creatinine improved prior to discharge. He was placed on ACE inhibitor. He remained in NSR.  Returns for follow-up.  Here with his wife. Overall, he is doing well. He notes some issues after his cardiac catheterization with his right groin. I reviewed the hospital notes. It appears that he had a  small right groin pseudoaneurysm. There was a significant amount of swelling and compression could not be applied. He went off to bypass the same day. He notes a lot of numbness and tingling along his right thigh. Denies right groin pain. Chest is not sore. He denies significant breathing issues. He's walking about 4 miles a day. Denies orthopnea, PND. Left leg remains swollen. Denies fevers. Denies coughing or wheezing.   Past Medical History:  Diagnosis Date  . CAD (coronary artery disease) 2005   a. Previous stents RCA and circ, Cath 2005 patient with 70% PDA  //  b. [place] 5/17 >> LHC oLCx 50, pLAD 50, mLAD 95, RPDA 80, normal LVEF >> s/p CABG  . Carotid artery disease ([place])    a. Carotid US 5/17 bilat ICA 1-39%  . Cataract    replace 11/2012  . Diabetes mellitus    diet controlled  . History of detached retina repair 11-06-2006   [facility]  .  History of echocardiogram    a. EF 55% to 60%. Wall motion was normal, Gr 1 DD, Trivial MR/TR  . History of hematuria   . History of prostate cancer 03/2005   s/p prostatectomy  . Hyperlipidemia   . Hypertension     Past Surgical History:  Procedure Laterality Date  . ANGIOPLASTY     1995,2000,2002  . CARDIAC CATHETERIZATION  05.31.2002   Initially the stenosis in the proximal to mid right coronary artery was estimated ar 95%. Following stenting, this improved to 0%. There was residual mild disease in the sostium of the proximal right coronary atrtery. Successful stentng of the proximal to mid right coronary artery with improvement in stent diameter narrowing from 95% to 0% Proc Date  07/29/2000  . CARDIAC CATHETERIZATION N/A 07/04/2015   Procedure: Left Heart Cath and Coronary Angiography;  Surgeon: Jettie Booze, MD;  Location: [place] [facility];  Service: Cardiovascular;  Laterality: N/A;  . CATARACT EXTRACTION W/ INTRAOCULAR LENS IMPLANT Bilateral OS Sept '14; OD Oct '14   Dr. Bing Plume  . COLONOSCOPY    . CORONARY ARTERY BYPASS GRAFT N/A 07/08/2015   Procedure: CORONARY ARTERY BYPASS GRAFTING (CABG)x three using left internal mammary artery to left anterior decending artery, left greater saphenous vein grafts to diagonal and posterolateral. Left leg greater saphenous leg vein via endoscope. ;  Surgeon: [name], MD;  Location: [place];  Service: Open Heart Surgery;  Laterality: N/A;  . CYSTOSCOPY N/A 07/08/2015   Procedure: CYSTOSCOPY FLEXIBLE WITH BALLOON DILATION OF BLADDER NECK CONTRACTURE WITH DIFFICULT CATHETER PLACEMENT;  Surgeon: Raynelle Bring, MD;  Location: [place];  Service: Urology;  Laterality: N/A;  . EYE SURGERY  11/24/2012   left eye cataract  . EYE SURGERY  12/22/2012   right eye cataract  . HEMORRHOID SURGERY     I&D of thrombosed hemorrhoid  . LIPOMA EXCISION    . PROSTATECTOMY  03/2005  . PTCA  2005  . TEE WITHOUT CARDIOVERSION N/A 07/08/2015   Procedure:  TRANSESOPHAGEAL ECHOCARDIOGRAM (TEE);  Surgeon: [name], MD;  Location: [place];  Service: Open Heart Surgery;  Laterality: N/A;  . WRIST GANGLION EXCISION      Current Medications: Outpatient Medications Prior to Visit  Medication Sig Dispense Refill  . Ascorbic Acid (VITAMIN C PO) Take 1 tablet by mouth daily.    Marland Kitchen aspirin EC 81 MG tablet Take 81 mg by mouth once.     . blood glucose meter kit and supplies Dispense based on patient and insurance preference. Use up to four times daily as directed. (FOR ICD-9 250.00, 250.01). 1 each 0  . Multiple Vitamin (MULTIVITAMIN) tablet Take 1 tablet by mouth daily.      . ONE TOUCH ULTRA TEST test strip 1 each by Other route 2 (two) times daily. Use to check blood sugars twice a day Dx E11.9 100 each 5  . ONETOUCH DELICA LANCETS 33A MISC Use to help check blood sugars twice a day Dx e11.9 100 each 5  . VYTORIN 10-80 MG tablet Take 1 tablet by mouth  daily 90 tablet 1  . Metoprolol Tartrate 37.5 MG TABS Take 37.5 mg by mouth 2 (two) times daily. 60 tablet 3   No facility-administered medications prior to visit.       Allergies:   Cinnamon   Social History   Social History  . Marital status: Married    Spouse name: N/A  . Number of children: N/A  . Years of education: N/A   Social History Main Topics  . Smoking status: Former Smoker    Quit date: 02/27/1983  . Smokeless tobacco: Never Used  . Alcohol use No  . Drug use: No  . Sexual activity: Yes    Partners: Female   Other Topics Concern  . 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 ACP - discussed with patient and provided packet and referral to [email or web address] Nov '15. He already has stated that if he is :"comatose" pull the plug.      Family History:  The patient's family history includes CAD in his father; Colon polyps in his sister;  Diabetes in his sister.   ROS:   Please see the history of present illness.    Review of Systems  Cardiovascular: Positive for chest pain and leg swelling.  Gastrointestinal: Positive for constipation.   All other systems reviewed and are negative.   Physical Exam:    VS:  BP 126/78 (BP Location: Left Arm, Patient Position: Sitting, Cuff Size: Normal)   Pulse 66   Ht _0  (1.626 m)   Wt 170 lb 12.8 oz (77.5 kg)   SpO2 96%   BMI 29.32 kg/m    Physical Exam  Constitutional: He is oriented to person, place, and  time. He appears well-developed and well-nourished.  HENT:  Head: Normocephalic and atraumatic.  Neck: Normal range of motion. Neck supple. No JVD present.  Cardiovascular: Normal rate, regular rhythm and normal heart sounds.  Exam reveals no friction rub.   No murmur heard. R groin without pulsatile mass or bruit and no significant tenderness R leg trace-1+ ankle edema L leg with 1-2+ edema, calf soft and nontender L leg vein harvesting site well healed without erythema or discharge  Pulmonary/Chest: Effort normal and breath sounds normal. He has no wheezes. He has no rales.  Median sternotomy well healed without erythema or discharge  Abdominal: Soft. He exhibits no distension and no mass. There is no tenderness.  Musculoskeletal: Normal range of motion.  Neurological: He is alert and oriented to person, place, and time.  Skin: Skin is warm and dry.  Psychiatric: He has a normal mood and affect.    Wt Readings from Last 3 Encounters:  10/19/15 170 lb 12.8 oz (77.5 kg)  09/22/15 172 lb 2.9 oz (78.1 kg)  09/12/15 169 lb 3.2 oz (76.7 kg)      Studies/Labs Reviewed:     EKG:  08/30/15   NSR, HR 73, leftward axis, poor R-wave progression, QTc 438 ms, no significant changes  Recent Labs: 07/09/2015: Magnesium 2.3 07/13/2015: Hemoglobin 8.5; Platelets 307 09/12/2015: ALT 16; BUN 16; Creatinine, Ser 1.25; Potassium 4.6; Sodium 141   Recent Lipid Panel    Component  Value Date/Time   CHOL 118 07/03/2015 0255   TRIG 120 07/03/2015 0255   TRIG 133 02/25/2006 0822   HDL 43 07/03/2015 0255   CHOLHDL 2.7 07/03/2015 0255   VLDL 24 07/03/2015 0255   LDLCALC 51 07/03/2015 0255    Additional studies/ records that were reviewed today include:   Intraop TEE 07/08/15 EF 55% to 60%. Wall motion was normal  Carotid US 07/06/15 Bilaterally - 1% to 39% ICA stenosis. Vertebral artery flow is antegrade.  Echo 07/06/15 EF 55% to 60%. Wall motion was normal, Gr 1 DD, Trivial MR/TR  LHC 07/04/15  Ost Cx lesion, 50% stenosed.  Mid LAD lesion, 95% stenosed. Severe, diffuse disease in the mid to distal LAD which is all heavily calcified. The origin of a moderate-sized diagonal is also compromised.  Prox LAD to Mid LAD lesion, 50% stenosed.  RPDA lesion, 80% stenosed.  The left ventricular systolic function is normal.  Normal LVEDP.   ASSESSMENT:     1. Bilateral carotid artery disease ([place])   2. Medication management     PLAN:     In order of problems listed above:  1. CABG:  07/08/15  Cath sight pseudoaneurysm healed and SVG harvest sights better continue Beta blocker at lower dose  and ASA will finish cardiac rehab next month   2. HTN - Controlled. Decrease metoprolol to 25 bid as it causes fatigue   3. HL - Continue high-dose statin. Recent LDL 51.  4. CKD -CR 1.25 on 08/08/15 repeat today and consider ACE for renal protection at discretion of primary   5. Carotid artery disease - Mild bilat plaque by [name] at CABG.  Consider repeat US in 1-2 years.  6. DM - FU with PCP for strict control.   Lab Results  Component Value Date   HGBA1C 6.2 09/12/2015   7. Edema - improved off diuretics now due to CRF and azotemia  08/02/15 duplex no DVT  8. Right groin pseudoaneurysm - This has resolved based upon exam.  He has no  RFA site tenderness, pulsatile mass or bruit.     Jenkins Rouge

## 2015-10-17 ENCOUNTER — Encounter (HOSPITAL_COMMUNITY)
Admission: RE | Admit: 2015-10-17 | Discharge: 2015-10-17 | Disposition: A | Discharge status: Home / Self Care | Payer: 59 | Source: Ambulatory Visit | Attending: Cardiovascular Disease | Admitting: Cardiovascular Disease

## 2015-10-17 ENCOUNTER — Encounter (HOSPITAL_COMMUNITY): Payer: 59

## 2015-10-17 DIAGNOSIS — Z951 Presence of aortocoronary bypass graft: Secondary | ICD-10-CM

## 2015-10-19 ENCOUNTER — Encounter: Payer: Self-pay | Admitting: Cardiovascular Disease

## 2015-10-19 ENCOUNTER — Encounter (HOSPITAL_COMMUNITY)
Admission: RE | Admit: 2015-10-19 | Discharge: 2015-10-19 | Disposition: A | Discharge status: Home / Self Care | Payer: 59 | Source: Ambulatory Visit | Attending: Cardiovascular Disease | Admitting: Cardiovascular Disease

## 2015-10-19 ENCOUNTER — Ambulatory Visit (INDEPENDENT_AMBULATORY_CARE_PROVIDER_SITE_OTHER): Payer: 59 | Admitting: Cardiovascular Disease

## 2015-10-19 ENCOUNTER — Encounter (HOSPITAL_COMMUNITY): Payer: 59

## 2015-10-19 VITALS — BP 126/78 | HR 66 | Ht 64.0 in | Wt 170.8 lb

## 2015-10-19 DIAGNOSIS — I779 Disorder of arteries and arterioles, unspecified: Secondary | ICD-10-CM

## 2015-10-19 DIAGNOSIS — Z951 Presence of aortocoronary bypass graft: Secondary | ICD-10-CM

## 2015-10-19 DIAGNOSIS — Z79899 Other long term (current) drug therapy: Secondary | ICD-10-CM | POA: Diagnosis not present

## 2015-10-19 DIAGNOSIS — I739 Peripheral vascular disease, unspecified: Principal | ICD-10-CM

## 2015-10-19 LAB — BASIC METABOLIC PANEL
BUN: 19 mg/dL (ref 7–25)
CO2: 25 mmol/L (ref 20–31)
Calcium: 9.8 mg/dL (ref 8.6–10.3)
Chloride: 108 mmol/L (ref 98–110)
Creat: 1.3 mg/dL — ABNORMAL HIGH (ref 0.70–1.18)
GLUCOSE: 73 mg/dL (ref 65–99)
POTASSIUM: 4.2 mmol/L (ref 3.5–5.3)
Sodium: 142 mmol/L (ref 135–146)

## 2015-10-19 MED ORDER — METOPROLOL TARTRATE 25 MG PO TABS: 25.0000 mg | ORAL_TABLET | Freq: Two times a day (BID) | ORAL | 3 refills | Status: DC

## 2015-10-19 NOTE — Progress Notes (Signed)
Reviewed home exercise with pt today.  Pt plans to walk for exercise, 2-3 x week in addition to coming to cardiac rehab. Reviewed THR, pulse, RPE, sign and symptoms, NTG use, and when to call 911 or MD.  Also discussed weather considerations and indoor options.  Pt voiced understanding.

## 2015-10-19 NOTE — Patient Instructions (Addendum)
Medication Instructions:  Your physician has recommended you make the following change in your medication:  Decrease Metoprolol 25 mg by mouth twice daily  Labwork: Your physician recommends that you have lab work today BMET  Testing/Procedures: NONE  Follow-Up: Your physician wants you to follow-up in: 6 months with Dr. Johnsie Cancel. You will receive a reminder letter in the mail two months in advance. If you don't receive a letter, please call our office to schedule the follow-up appointment.   If you need a refill on your cardiac medications before your next appointment, please call your pharmacy.

## 2015-10-21 ENCOUNTER — Encounter (HOSPITAL_COMMUNITY): Payer: 59

## 2015-10-21 ENCOUNTER — Encounter (HOSPITAL_COMMUNITY)
Admission: RE | Admit: 2015-10-21 | Discharge: 2015-10-21 | Disposition: A | Discharge status: Home / Self Care | Payer: 59 | Source: Ambulatory Visit | Attending: Cardiovascular Disease | Admitting: Cardiovascular Disease

## 2015-10-21 ENCOUNTER — Telehealth: Payer: Self-pay | Admitting: Cardiovascular Disease

## 2015-10-21 DIAGNOSIS — Z951 Presence of aortocoronary bypass graft: Secondary | ICD-10-CM | POA: Diagnosis not present

## 2015-10-21 NOTE — Telephone Encounter (Signed)
Called patient back with lab results. 

## 2015-10-21 NOTE — Telephone Encounter (Signed)
New message ° ° ° ° °Returning a call to the nurse to get lab results °

## 2015-10-24 ENCOUNTER — Encounter (HOSPITAL_COMMUNITY)
Admission: RE | Admit: 2015-10-24 | Discharge: 2015-10-24 | Disposition: A | Discharge status: Home / Self Care | Payer: 59 | Source: Ambulatory Visit | Attending: Cardiovascular Disease | Admitting: Cardiovascular Disease

## 2015-10-24 ENCOUNTER — Encounter (HOSPITAL_COMMUNITY): Payer: 59

## 2015-10-24 DIAGNOSIS — Z951 Presence of aortocoronary bypass graft: Secondary | ICD-10-CM | POA: Diagnosis not present

## 2015-10-26 ENCOUNTER — Encounter (HOSPITAL_COMMUNITY)
Admission: RE | Admit: 2015-10-26 | Discharge: 2015-10-26 | Disposition: A | Discharge status: Home / Self Care | Payer: 59 | Source: Ambulatory Visit | Attending: Cardiovascular Disease | Admitting: Cardiovascular Disease

## 2015-10-26 ENCOUNTER — Encounter (HOSPITAL_COMMUNITY): Payer: 59

## 2015-10-26 DIAGNOSIS — Z951 Presence of aortocoronary bypass graft: Secondary | ICD-10-CM | POA: Diagnosis not present

## 2015-10-28 ENCOUNTER — Encounter (HOSPITAL_COMMUNITY)
Admission: RE | Admit: 2015-10-28 | Discharge: 2015-10-28 | Disposition: A | Discharge status: Home / Self Care | Payer: 59 | Source: Ambulatory Visit | Attending: Cardiovascular Disease | Admitting: Cardiovascular Disease

## 2015-10-28 ENCOUNTER — Encounter (HOSPITAL_COMMUNITY): Payer: 59

## 2015-10-28 DIAGNOSIS — Z951 Presence of aortocoronary bypass graft: Secondary | ICD-10-CM | POA: Diagnosis not present

## 2015-10-28 NOTE — Progress Notes (Signed)
Clifford [name] 70 y.o. male Nutrition Note Spoke with pt. Nutrition Plan and Nutrition Survey goals reviewed with pt. Pt is following Step 2 of the Therapeutic Lifestyle Changes diet. Pt wants to lose wt. Pt has been trying to lose wt by eating healthier and exercising. Wt loss tips reviewed. Pt is diabetic. Last A1c indicates blood glucose well-controlled. This Probation officer went over Diabetes Education test results. Pt checks CBG's 2 times a day. CBG's in the morning and in the evening reportedly 120-122 mg/dL. The highest CBG was 130 mg/dL. Pt expressed understanding of the information reviewed. Pt aware of nutrition education classes offered.  Lab Results  Component Value Date   HGBA1C 6.2 09/12/2015   Wt Readings from Last 3 Encounters:  10/19/15 170 lb 12.8 oz (77.5 kg)  09/22/15 172 lb 2.9 oz (78.1 kg)  09/12/15 169 lb 3.2 oz (76.7 kg)    Nutrition Diagnosis ? Food-and nutrition-related knowledge deficit related to lack of exposure to information as related to diagnosis of: ? CVD ? DM  ? Overweight related to excessive energy intake as evidenced by a BMI of 29.8  Nutrition Intervention ? Pt's individual nutrition plan reviewed with pt. ? Benefits of adopting Therapeutic Lifestyle Changes discussed when Medficts reviewed. ? Pt to attend the Portion Distortion class ? Pt to attend the Diabetes Q & A class ? Pt to attend the   ? Nutrition I class                  ? Nutrition II class     ? Diabetes Blitz class ? Given pt diet-controlled DM, consider CBG checks every other day. ? Continue client-centered nutrition education by RD, as part of interdisciplinary care. Goal(s) ? Pt to identify food quantities necessary to achieve weight loss of 6-24 lb (2.7-10.9 kg) at graduation from cardiac rehab.  ? CBG concentrations in the normal range or as close to normal as is safely possible. Monitor and Evaluate progress toward nutrition goal with team. Derek Mound, M.Ed, RD, LDN, CDE 10/28/2015  9:56 AM

## 2015-11-02 ENCOUNTER — Encounter (HOSPITAL_COMMUNITY)
Admission: RE | Admit: 2015-11-02 | Discharge: 2015-11-02 | Disposition: A | Discharge status: Home / Self Care | Payer: 59 | Source: Ambulatory Visit | Attending: Cardiovascular Disease | Admitting: Cardiovascular Disease

## 2015-11-02 ENCOUNTER — Encounter (HOSPITAL_COMMUNITY): Payer: 59

## 2015-11-02 DIAGNOSIS — Z951 Presence of aortocoronary bypass graft: Secondary | ICD-10-CM | POA: Diagnosis not present

## 2015-11-04 ENCOUNTER — Encounter (HOSPITAL_COMMUNITY)
Admission: RE | Admit: 2015-11-04 | Discharge: 2015-11-04 | Disposition: A | Discharge status: Home / Self Care | Payer: 59 | Source: Ambulatory Visit | Attending: Cardiovascular Disease | Admitting: Cardiovascular Disease

## 2015-11-04 ENCOUNTER — Encounter (HOSPITAL_COMMUNITY): Payer: 59

## 2015-11-04 DIAGNOSIS — Z951 Presence of aortocoronary bypass graft: Secondary | ICD-10-CM | POA: Diagnosis not present

## 2015-11-07 ENCOUNTER — Encounter (HOSPITAL_COMMUNITY)
Admission: RE | Admit: 2015-11-07 | Discharge: 2015-11-07 | Disposition: A | Discharge status: Home / Self Care | Payer: 59 | Source: Ambulatory Visit | Attending: Cardiovascular Disease | Admitting: Cardiovascular Disease

## 2015-11-07 ENCOUNTER — Encounter (HOSPITAL_COMMUNITY): Payer: 59

## 2015-11-07 DIAGNOSIS — Z951 Presence of aortocoronary bypass graft: Secondary | ICD-10-CM | POA: Diagnosis not present

## 2015-11-08 NOTE — Progress Notes (Signed)
Cardiac Individual Treatment Plan  Patient Details  Name: Clifford [name] MRN: [id number] Date of Birth: Oct 30, 1945 Referring Provider:   April Manson CARDIAC REHAB PHASE II ORIENTATION from 09/22/2015 in [place]  Referring Provider  Jenkins Rouge MD      Initial Encounter Date:  [place] PHASE II ORIENTATION from 09/22/2015 in [place]  Date  09/22/15  Referring Provider  Jenkins Rouge MD      Visit Diagnosis: 07/08/15 S/P CABG x 3  Patient's Home Medications on Admission:  Current Outpatient Prescriptions:  .  Ascorbic Acid (VITAMIN C PO), Take 1 tablet by mouth daily., Disp: , Rfl:  .  aspirin EC 81 MG tablet, Take 81 mg by mouth once. , Disp: , Rfl:  .  blood glucose meter kit and supplies, Dispense based on patient and insurance preference. Use up to four times daily as directed. (FOR ICD-9 250.00, 250.01)., Disp: 1 each, Rfl: 0 .  metoprolol tartrate (LOPRESSOR) 25 MG tablet, Take 1 tablet (25 mg total) by mouth 2 (two) times daily., Disp: 180 tablet, Rfl: 3 .  Multiple Vitamin (MULTIVITAMIN) tablet, Take 1 tablet by mouth daily.  , Disp: , Rfl:  .  ONE TOUCH ULTRA TEST test strip, 1 each by Other route 2 (two) times daily. Use to check blood sugars twice a day Dx E11.9, Disp: 100 each, Rfl: 5 .  ONETOUCH DELICA LANCETS 28J MISC, Use to help check blood sugars twice a day Dx e11.9, Disp: 100 each, Rfl: 5 .  VYTORIN 10-80 MG tablet, Take 1 tablet by mouth  daily, Disp: 90 tablet, Rfl: 1  Past Medical History: Past Medical History:  Diagnosis Date  . CAD (coronary artery disease) 2005   a. Previous stents RCA and circ, Cath 2005 patient with 70% PDA  //  b. [place] 5/17 >> LHC oLCx 50, pLAD 50, mLAD 95, RPDA 80, normal LVEF >> s/p CABG  . Carotid artery disease ([place])    a. Carotid US 5/17 bilat ICA 1-39%  . Cataract    replace 11/2012  . Diabetes mellitus    diet controlled  . History of  detached retina repair 11-06-2006   [facility]  . History of echocardiogram    a. EF 55% to 60%. Wall motion was normal, Gr 1 DD, Trivial MR/TR  . History of hematuria   . History of prostate cancer 03/2005   s/p prostatectomy  . Hyperlipidemia   . Hypertension     Tobacco Use: History  Smoking Status  . Former Smoker  . Quit date: 02/27/1983  Smokeless Tobacco  . Never Used    Labs: Recent Review Flowsheet Data    Labs for ITP Cardiac and Pulmonary Rehab Latest Ref Rng & Units 07/08/2015 07/08/2015 07/08/2015 07/09/2015 09/12/2015   Cholestrol 0 - 200 mg/dL - - - - -   LDLCALC 0 - 99 mg/dL - - - - -   HDL >40 mg/dL - - - - -   Trlycerides <150 mg/dL - - - - -   Hemoglobin A1c 4.6 - 6.5 % - - - - 6.2   PHART 7.350 - 7.450 7.304(L) - 7.343(L) - -   PCO2ART 35.0 - 45.0 mmHg 46.0(H) - 38.6 - -   HCO3 20.0 - 24.0 mEq/L 22.7 - 20.8 - -   TCO2 0 - 100 mmol/L _0 -   ACIDBASEDEF 0.0 - 2.0 mmol/L 3.0(H) - 4.0(H) - -  O2SAT % 93.0 - 93.0 - -      Capillary Blood Glucose: Lab Results  Component Value Date   GLUCAP 117 (H) 09/28/2015   GLUCAP 102 (H) 09/28/2015   GLUCAP 133 (H) 09/26/2015   GLUCAP 68 09/26/2015   GLUCAP 206 (H) 09/26/2015     Exercise Target Goals:    Exercise Program Goal: Individual exercise prescription set with THRR, safety & activity barriers. Participant demonstrates ability to understand and report RPE using BORG scale, to self-measure pulse accurately, and to acknowledge the importance of the exercise prescription.  Exercise Prescription Goal: Starting with aerobic activity 30 plus minutes a day, 3 days per week for initial exercise prescription. Provide home exercise prescription and guidelines that participant acknowledges understanding prior to discharge.  Activity Barriers & Risk Stratification:     Activity Barriers & Cardiac Risk Stratification - 09/22/15 0850      Activity Barriers & Cardiac Risk Stratification   Activity Barriers  None   Cardiac Risk Stratification High      6 Minute Walk:     6 Minute Walk    Row Name 09/22/15 1556         6 Minute Walk   Phase Initial     Distance 1620 feet     Walk Time 6 minutes     # of Rest Breaks 0     MPH 3.1     METS 3.2     RPE 11     VO2 Peak 11.04     Symptoms No     Resting HR 60 bpm     Resting BP 138/78     Max Ex. HR 98 bpm     Max Ex. BP 132/76     2 Minute Post BP 128/72        Initial Exercise Prescription:     Initial Exercise Prescription - 09/22/15 1500      Date of Initial Exercise RX and Referring Provider   Date 09/22/15   Referring Provider Jenkins Rouge MD     Treadmill   MPH 2.5   Grade 0   Minutes 10   METs 2.91     Bike   Level 0.5   Minutes 10   METs 2.26     NuStep   Level 3   Minutes 10   METs 2     Prescription Details   Frequency (times per week) 3   Duration Progress to 30 minutes of continuous aerobic without signs/symptoms of physical distress     Intensity   THRR 40-80% of Max Heartrate 59-118   Ratings of Perceived Exertion 11-13   Perceived Dyspnea 0-4     Progression   Progression Continue to progress workloads to maintain intensity without signs/symptoms of physical distress.     Resistance Training   Training Prescription Yes   Weight 2lbs   Reps 10-12      Perform Capillary Blood Glucose checks as needed.  Exercise Prescription Changes:      Exercise Prescription Changes    Row Name 10/03/15 1000 10/19/15 1100 11/04/15 1000         Exercise Review   Progression Yes Yes Yes       Response to Exercise   Blood Pressure (Admit) 110/70 114/60 132/78     Blood Pressure (Exercise) 134/72 148/78 170/90  on airdyne moved to recumbent bike, rechk BP 164/84     Blood Pressure (Exit) 112/70 122/62 128/72     Heart  Rate (Admit) 59 bpm 54 bpm 70 bpm     Heart Rate (Exercise) 117 bpm 118 bpm 120 bpm     Heart Rate (Exit) 68 bpm 61 bpm 66 bpm     Perceived Dyspnea (Exercise) _0 Duration Progress to 30 minutes of continuous aerobic without signs/symptoms of physical distress Progress to 30 minutes of continuous aerobic without signs/symptoms of physical distress Progress to 30 minutes of continuous aerobic without signs/symptoms of physical distress     Intensity THRR unchanged THRR unchanged THRR unchanged       Progression   Progression Continue to progress workloads to maintain intensity without signs/symptoms of physical distress. Continue to progress workloads to maintain intensity without signs/symptoms of physical distress. Continue to progress workloads to maintain intensity without signs/symptoms of physical distress.     Average METs 3.3 4.1 4.1       Resistance Training   Training Prescription Yes Yes Yes     Weight 2lbs 3lbs 4lbs     Reps 10-12 10-12 10-12       Treadmill   MPH 2._1 Grade 0 1 2     Minutes _2 METs 2.91 3.35 4.12       Bike   Level 0.5 0.5 1.2     Minutes _3 METs 2.21 2.21 3.9       NuStep   Level _4 Minutes _5 METs 3.6 4.8 4.1       Home Exercise Plan   Plans to continue exercise at  - Home  Reviewed on 10/07/15 see progress note  Home  Reviewed on 10/07/15 see progress note      Frequency  - Add 3 additional days to program exercise sessions. Add 3 additional days to program exercise sessions.        Exercise Comments:      Exercise Comments    Row Name 10/03/15 1048 10/19/15 1132 11/04/15 1012       Exercise Comments Reviewed METs and goals. Pt is tolerating exercise well; will continue to monitor exercise progression. Reviewed METs. Pt is tolerating exercise well; will continue to monitor exercise progression. Reviewed METs and goals. Pt is tolerating exercise well; will continue to monitor exercise progression.        Discharge Exercise Prescription (Final Exercise Prescription Changes):     Exercise Prescription Changes - 11/04/15 1000      Exercise Review    Progression Yes     Response to Exercise   Blood Pressure (Admit) 132/78   Blood Pressure (Exercise) 170/90  on airdyne moved to recumbent bike, rechk BP 164/84   Blood Pressure (Exit) 128/72   Heart Rate (Admit) 70 bpm   Heart Rate (Exercise) 120 bpm   Heart Rate (Exit) 66 bpm   Perceived Dyspnea (Exercise) 11   Duration Progress to 30 minutes of continuous aerobic without signs/symptoms of physical distress   Intensity THRR unchanged     Progression   Progression Continue to progress workloads to maintain intensity without signs/symptoms of physical distress.   Average METs 4.1     Resistance Training   Training Prescription Yes   Weight 4lbs   Reps 10-12     Treadmill   MPH 3   Grade 2   Minutes 10   METs 4.12  Bike   Level 1.2   Minutes 10   METs 3.9     NuStep   Level 3   Minutes 10   METs 4.1     Home Exercise Plan   Plans to continue exercise at Home  Reviewed on 10/07/15 see progress note    Frequency Add 3 additional days to program exercise sessions.      Nutrition:  Target Goals: Understanding of nutrition guidelines, daily intake of sodium <1519m, cholesterol <2028m calories 30% from fat and 7% or less from saturated fats, daily to have 5 or more servings of fruits and vegetables.  Biometrics:     Pre Biometrics - 09/22/15 1601      Pre Biometrics   Waist Circumference 38 inches   Hip Circumference 38 inches   Waist to Hip Ratio 1 %   Triceps Skinfold 16 mm   % Body Fat 28.7 %   Grip Strength 39 kg   Flexibility 11.75 in   Single Leg Stand 13.31 seconds       Nutrition Therapy Plan and Nutrition Goals:     Nutrition Therapy & Goals - 09/26/15 1055      Nutrition Therapy   Diet Carb Modified, Therapeutic Lifestyle Change     Personal Nutrition Goals   Personal Goal #1 1-2 lb wt loss/week to a goal wt loss of 6-24 lb at [facility]  regarding individualized specific dietary modifications aiming towards targeted core components such as weight, hypertension, lipid management, diabetes, heart failure and other comorbidities.   Expected Outcomes Short Term Goal: Understand basic principles of dietary content, such as calories, fat, sodium, cholesterol and nutrients.;Long Term Goal: Adherence to prescribed nutrition plan.      Nutrition Discharge: Nutrition Scores:     Nutrition Assessments - 10/28/15 1001      MEDFICTS Scores   Pre Score 12      Nutrition Goals Re-Evaluation:   Psychosocial: Target Goals: Acknowledge presence or absence of depression, maximize coping skills, provide positive support system. Participant is able to verbalize types and ability to use techniques and skills needed for reducing stress and depression.  Initial Review & Psychosocial Screening:     Initial Psych Review & Screening - 09/26/15 [place]     Barriers   Psychosocial barriers to participate in program The patient should benefit from training in stress management and relaxation.;There are no identifiable barriers or psychosocial needs.     Screening Interventions   Interventions Encouraged to exercise      Quality of Life Scores:     Quality of Life - 09/26/15 0921      Quality of Life Scores   GLOBAL Pre --  great QOL scores, pt exhibits hopeful outlook with good coping skills       PHQ-9: Recent Review Flowsheet Data    [facility] 09/26/2015 01/17/2015 01/14/2014   Decreased Interest 0 0 0   Down, Depressed, Hopeless 0 0 0   PHQ - 2 Score 0 0 0      Psychosocial Evaluation and Intervention:     Psychosocial Evaluation - 10/11/15 1642      Psychosocial Evaluation & Interventions   Interventions Stress management education;Relaxation education;Encouraged to exercise with the program and follow exercise prescription   Continued Psychosocial  Services Needed No  Psychosocial Re-Evaluation:     Psychosocial Re-Evaluation    Row Name 10/26/15 1243 11/04/15 0751 11/08/15 1318         Psychosocial Re-Evaluation   Interventions  -  - Encouraged to attend Cardiac Rehabilitation for the exercise     Comments no psychosocial needs identified, no interventions necessary  pt reports he is very pleased that he was able to go to [facility] show and walk without difficulty.    -     Continued Psychosocial Services Needed No No -  no psychosocial needs identified, no psychosocial intervention necessary         Vocational Rehabilitation: Provide vocational rehab assistance to qualifying candidates.   Vocational Rehab Evaluation & Intervention:     Vocational Rehab - 09/22/15 1442      Initial Vocational Rehab Evaluation & Intervention   Assessment shows need for Vocational Rehabilitation No      Education: Education Goals: Education classes will be provided on a weekly basis, covering required topics. Participant will state understanding/return demonstration of topics presented.  Learning Barriers/Preferences:     Learning Barriers/Preferences - 09/22/15 0850      Learning Barriers/Preferences   Learning Preferences Skilled Demonstration;Video;Written Material;Pictoral      Education Topics: Count Your Pulse:  -Group instruction provided by verbal instruction, demonstration, patient participation and written materials to support subject.  Instructors address importance of being able to find your pulse and how to count your pulse when at home without a heart monitor.  Patients get hands on experience counting their pulse with staff help and individually. Flowsheet Row CARDIAC REHAB PHASE II EXERCISE from 11/09/2015 in [place]  Date  10/28/15  Educator  Maurice Small, RN  Instruction Review Code  2- meets goals/outcomes      Heart Attack, Angina, and Risk Factor  Modification:  -Group instruction provided by verbal instruction, video, and written materials to support subject.  Instructors address signs and symptoms of angina and heart attacks.    Also discuss risk factors for heart disease and how to make changes to improve heart health risk factors. Flowsheet Row CARDIAC REHAB PHASE II EXERCISE from 11/09/2015 in [place]  Date  10/19/15  Instruction Review Code  2- meets goals/outcomes      Functional Fitness:  -Group instruction provided by verbal instruction, demonstration, patient participation, and written materials to support subject.  Instructors address safety measures for doing things around the house.  Discuss how to get up and down off the floor, how to pick things up properly, how to safely get out of a chair without assistance, and balance training. Flowsheet Row CARDIAC REHAB PHASE II EXERCISE from 11/09/2015 in [place]  Date  10/14/15  Instruction Review Code  2- meets goals/outcomes      Meditation and Mindfulness:  -Group instruction provided by verbal instruction, patient participation, and written materials to support subject.  Instructor addresses importance of mindfulness and meditation practice to help reduce stress and improve awareness.  Instructor also leads participants through a meditation exercise.    Stretching for Flexibility and Mobility:  -Group instruction provided by verbal instruction, patient participation, and written materials to support subject.  Instructors lead participants through series of stretches that are designed to increase flexibility thus improving mobility.  These stretches are additional exercise for major muscle groups that are typically performed during regular warm up and cool down. [facility] REHAB PHASE II  EXERCISE from 11/09/2015 in [place]  Date  10/21/15  Instruction Review Code   2- meets goals/outcomes      Hands Only CPR Anytime:  -Group instruction provided by verbal instruction, video, patient participation and written materials to support subject.  Instructors co-teach with AHA video for hands only CPR.  Participants get hands on experience with mannequins.   Nutrition I class: Heart Healthy Eating:  -Group instruction provided by PowerPoint slides, verbal discussion, and written materials to support subject matter. The instructor gives an explanation and review of the Therapeutic Lifestyle Changes diet recommendations, which includes a discussion on lipid goals, dietary fat, sodium, fiber, plant stanol/sterol esters, sugar, and the components of a well-balanced, healthy diet. Flowsheet Row CARDIAC REHAB PHASE II EXERCISE from 11/09/2015 in [place]  Date  09/27/15  Educator  RD  Instruction Review Code  2- meets goals/outcomes      Nutrition II class: Lifestyle Skills:  -Group instruction provided by PowerPoint slides, verbal discussion, and written materials to support subject matter. The instructor gives an explanation and review of label reading, grocery shopping for heart health, heart healthy recipe modifications, and ways to make healthier choices when eating out. Flowsheet Row CARDIAC REHAB PHASE II EXERCISE from 11/09/2015 in [place]  Date  10/04/15  Educator  RD  Instruction Review Code  2- meets goals/outcomes      Diabetes Question & Answer:  -Group instruction provided by PowerPoint slides, verbal discussion, and written materials to support subject matter. The instructor gives an explanation and review of diabetes co-morbidities, pre- and post-prandial blood glucose goals, pre-exercise blood glucose goals, signs, symptoms, and treatment of hypoglycemia and hyperglycemia, and foot care basics. Flowsheet Row CARDIAC REHAB PHASE II EXERCISE from 11/09/2015 in [place]  Date  11/04/15  Educator  RD  Instruction Review Code  2- meets goals/outcomes      Diabetes Blitz:  -Group instruction provided by PowerPoint slides, verbal discussion, and written materials to support subject matter. The instructor gives an explanation and review of the physiology behind type 1 and type 2 diabetes, diabetes medications and rational behind using different medications, pre- and post-prandial blood glucose recommendations and Hemoglobin A1c goals, diabetes diet, and exercise including blood glucose guidelines for exercising safely.  Flowsheet Row CARDIAC REHAB PHASE II EXERCISE from 11/09/2015 in [place]  Date  10/11/15  Educator  RD  Instruction Review Code  2- meets goals/outcomes      Portion Distortion:  -Group instruction provided by PowerPoint slides, verbal discussion, written materials, and food models to support subject matter. The instructor gives an explanation of serving size versus portion size, changes in portions sizes over the last 20 years, and what consists of a serving from each food group. Flowsheet Row CARDIAC REHAB PHASE II EXERCISE from 11/09/2015 in [place]  Date  10/05/15  Educator  RD  Instruction Review Code  2- meets goals/outcomes      Stress Management:  -Group instruction provided by verbal instruction, video, and written materials to support subject matter.  Instructors review role of stress in heart disease and how to cope with stress positively.   Flowsheet Row CARDIAC REHAB PHASE II EXERCISE from 11/09/2015 in [place]  Date  09/28/15  Instruction Review Code  2- meets goals/outcomes      Exercising on Your  Own:  -Group instruction provided by verbal instruction, power point, and written materials to support subject.  Instructors discuss benefits of exercise, components of exercise, frequency and intensity of  exercise, and end points for exercise.  Also discuss use of nitroglycerin and activating EMS.  Review options of places to exercise outside of rehab.  Review guidelines for sex with heart disease.   Cardiac Drugs I:  -Group instruction provided by verbal instruction and written materials to support subject.  Instructor reviews cardiac drug classes: antiplatelets, anticoagulants, beta blockers, and statins.  Instructor discusses reasons, side effects, and lifestyle considerations for each drug class. Flowsheet Row CARDIAC REHAB PHASE II EXERCISE from 11/09/2015 in [place]  Date  10/12/15  Educator  pharmD  Instruction Review Code  2- meets goals/outcomes      Cardiac Drugs II:  -Group instruction provided by verbal instruction and written materials to support subject.  Instructor reviews cardiac drug classes: angiotensin converting enzyme inhibitors (ACE-I), angiotensin II receptor blockers (ARBs), nitrates, and calcium channel blockers.  Instructor discusses reasons, side effects, and lifestyle considerations for each drug class. Flowsheet Row CARDIAC REHAB PHASE II EXERCISE from 11/09/2015 in [place]  Date  11/09/15  Instruction Review Code  2- meets goals/outcomes      Anatomy and Physiology of the Circulatory System:  -Group instruction provided by verbal instruction, video, and written materials to support subject.  Reviews functional anatomy of heart, how it relates to various diagnoses, and what role the heart plays in the overall system. Flowsheet Row CARDIAC REHAB PHASE II EXERCISE from 11/09/2015 in [place]  Date  11/02/15  Instruction Review Code  2- meets goals/outcomes      Knowledge Questionnaire Score:     Knowledge Questionnaire Score - 09/26/15 1055      Knowledge Questionnaire Score   Pre Score 19/24     DM 14/15      Core Components/Risk Factors/Patient Goals  at Admission:     Personal Goals and Risk Factors at Admission - 09/22/15 0851      Core Components/Risk Factors/Patient Goals on Admission    Weight Management Yes;Weight Loss   Intervention Weight Management: Develop a combined nutrition and exercise program designed to reach desired caloric intake, while maintaining appropriate intake of nutrient and fiber, sodium and fats, and appropriate energy expenditure required for the weight goal.;Weight Management: Provide education and appropriate resources to help participant work on and attain dietary goals.;Weight Management/Obesity: Establish reasonable short term and long term weight goals.;Obesity: Provide education and appropriate resources to help participant work on and attain dietary goals.   Expected Outcomes Short Term: Continue to assess and modify interventions until short term weight is achieved;Weight Maintenance: Understanding of the daily nutrition guidelines, which includes 25-35% calories from fat, 7% or less cal from saturated fats, less than 271m cholesterol, less than 1.5gm of sodium, & 5 or more servings of fruits and vegetables daily;Long Term: Adherence to nutrition and physical activity/exercise program aimed toward attainment of established weight goal;Weight Loss: Understanding of general recommendations for a balanced deficit meal plan, which promotes 1-2 lb weight loss per week and includes a negative energy balance of [phone] kcal/d;Understanding recommendations for meals to include 15-35% energy as protein, 25-35% energy from fat, 35-60% energy from carbohydrates, less than 2031mof dietary cholesterol, 20-35 gm of total fiber daily;Understanding of distribution of calorie intake throughout the day with the consumption of 4-5 meals/snacks  Increase Strength and Stamina Yes   Intervention Provide advice, education, support and counseling about physical activity/exercise needs.;Develop an individualized exercise prescription  for aerobic and resistive training based on initial evaluation findings, risk stratification, comorbidities and participant's personal goals.   Expected Outcomes Achievement of increased cardiorespiratory fitness and enhanced flexibility, muscular endurance and strength shown through measurements of functional capacity and personal statement of participant.   Diabetes Yes   Intervention Provide education about signs/symptoms and action to take for hypo/hyperglycemia.;Provide education about proper nutrition, including hydration, and aerobic/resistive exercise prescription along with prescribed medications to achieve blood glucose in normal ranges: Fasting glucose 65-99 mg/dL   Expected Outcomes Long Term: Attainment of HbA1C < 7%.;Short Term: Participant verbalizes understanding of the signs/symptoms and immediate care of hyper/hypoglycemia, proper foot care and importance of medication, aerobic/resistive exercise and nutrition plan for blood glucose control.   Hypertension Yes   Intervention Provide education on lifestyle modifcations including regular physical activity/exercise, weight management, moderate sodium restriction and increased consumption of fresh fruit, vegetables, and low fat dairy, alcohol moderation, and smoking cessation.;Monitor prescription use compliance.   Expected Outcomes Short Term: Continued assessment and intervention until BP is < 140/12m HG in hypertensive participants. < 130/843mHG in hypertensive participants with diabetes, heart failure or chronic kidney disease.;Long Term: Maintenance of blood pressure at goal levels.   Lipids Yes   Intervention Provide education and support for participant on nutrition & aerobic/resistive exercise along with prescribed medications to achieve LDL <7042mHDL >64m65m Expected Outcomes Short Term: Participant states understanding of desired cholesterol values and is compliant with medications prescribed. Participant is following exercise  prescription and nutrition guidelines.;Long Term: Cholesterol controlled with medications as prescribed, with individualized exercise RX and with personalized nutrition plan. Value goals: LDL < 70mg19mL > 40 mg.   Personal Goal Other Yes   Personal Goal build upper body strength, know what to do safely. Be in overall good health and be able to live without restrictions/limitations   Intervention Provide exercise programming that will assist with increasing strength/endurance and overall exercise capacity and confidence   Expected Outcomes Pt will be able to have better understanding on what to do per heart condition, and build strength. Overall be in good health      Core Components/Risk Factors/Patient Goals Review:      Goals and Risk Factor Review    Row Name 10/03/15 1049 11/04/15 1012           Core Components/Risk Factors/Patient Goals Review   Personal Goals Review Increase Strength and Stamina Increase Strength and Stamina      Review Very little difference with upper body strength.  Have noticed an increased strength and stamina in heart muscle Pt stated that UE is getting stronger and is feeling much better      Expected Outcomes Pt upper body strength will get stronger and cardiovascular fitness will continue to improve Pt will continue to increase strength and improve cardiovascular fitness         Core Components/Risk Factors/Patient Goals at Discharge (Final Review):      Goals and Risk Factor Review - 11/04/15 1012      Core Components/Risk Factors/Patient Goals Review   Personal Goals Review Increase Strength and Stamina   Review Pt stated that UE is getting stronger and is feeling much better   Expected Outcomes Pt will continue to increase strength and improve cardiovascular fitness      ITP Comments:     ITP Comments  Pe Ell Name 09/22/15 [phone] 09/30/15 0848         ITP Comments Dr. Fransico Him, Medical Director 09/30/15 Attended Hypertension education  class. Meets expected goals/outcomes.         Comments: Pt is making expected progress toward personal goals after completing 19 sessions. Recommend continued exercise and life style modification education including  stress management and relaxation techniques to decrease cardiac risk profile.

## 2015-11-09 ENCOUNTER — Encounter (HOSPITAL_COMMUNITY): Payer: 59

## 2015-11-09 ENCOUNTER — Encounter (HOSPITAL_COMMUNITY)
Admission: RE | Admit: 2015-11-09 | Discharge: 2015-11-09 | Disposition: A | Discharge status: Home / Self Care | Payer: 59 | Source: Ambulatory Visit | Attending: Cardiovascular Disease | Admitting: Cardiovascular Disease

## 2015-11-09 DIAGNOSIS — Z951 Presence of aortocoronary bypass graft: Secondary | ICD-10-CM

## 2015-11-11 ENCOUNTER — Encounter (HOSPITAL_COMMUNITY): Payer: 59

## 2015-11-11 ENCOUNTER — Encounter (HOSPITAL_COMMUNITY)
Admission: RE | Admit: 2015-11-11 | Discharge: 2015-11-11 | Disposition: A | Discharge status: Home / Self Care | Payer: 59 | Source: Ambulatory Visit | Attending: Cardiovascular Disease | Admitting: Cardiovascular Disease

## 2015-11-11 DIAGNOSIS — Z951 Presence of aortocoronary bypass graft: Secondary | ICD-10-CM

## 2015-11-14 ENCOUNTER — Encounter (HOSPITAL_COMMUNITY): Payer: 59

## 2015-11-14 ENCOUNTER — Encounter (HOSPITAL_COMMUNITY)
Admission: RE | Admit: 2015-11-14 | Discharge: 2015-11-14 | Disposition: A | Discharge status: Home / Self Care | Payer: 59 | Source: Ambulatory Visit | Attending: Cardiovascular Disease | Admitting: Cardiovascular Disease

## 2015-11-14 DIAGNOSIS — Z951 Presence of aortocoronary bypass graft: Secondary | ICD-10-CM | POA: Diagnosis not present

## 2015-11-16 ENCOUNTER — Encounter (HOSPITAL_COMMUNITY): Payer: 59

## 2015-11-16 ENCOUNTER — Encounter (HOSPITAL_COMMUNITY)
Admission: RE | Admit: 2015-11-16 | Discharge: 2015-11-16 | Disposition: A | Discharge status: Home / Self Care | Payer: 59 | Source: Ambulatory Visit | Attending: Cardiovascular Disease | Admitting: Cardiovascular Disease

## 2015-11-16 DIAGNOSIS — Z951 Presence of aortocoronary bypass graft: Secondary | ICD-10-CM

## 2015-11-18 ENCOUNTER — Encounter (HOSPITAL_COMMUNITY)
Admission: RE | Admit: 2015-11-18 | Discharge: 2015-11-18 | Disposition: A | Discharge status: Home / Self Care | Payer: 59 | Source: Ambulatory Visit | Attending: Cardiovascular Disease | Admitting: Cardiovascular Disease

## 2015-11-18 ENCOUNTER — Encounter (HOSPITAL_COMMUNITY): Payer: Self-pay

## 2015-11-18 ENCOUNTER — Encounter (HOSPITAL_COMMUNITY): Payer: 59

## 2015-11-18 VITALS — Wt 172.4 lb

## 2015-11-18 DIAGNOSIS — Z951 Presence of aortocoronary bypass graft: Secondary | ICD-10-CM

## 2015-11-18 NOTE — Progress Notes (Signed)
Discharge Summary  Patient Details  Name: Clifford [name] MRN: [id number] Date of Birth: [birth year] Referring Provider:   Flowsheet Row CARDIAC REHAB PHASE II ORIENTATION from 09/22/2015 in [place]  Referring Provider  Jenkins Rouge MD       Number of Visits: 24   Reason for Discharge:  Patient reached a stable level of exercise.  Pt exited early due to insurance.  Pt is most pleased with his increased upper body strength, increased stamina, reshaping flab and making new friends.  Pt has been congratulated on his success. Pt has also been instructed to continue monitoring his BP with some hypertension noted on exercise.  Rehab report will be forwarded to Dr. Johnsie Cancel for review.    Smoking History:  History  Smoking Status  . Former Smoker  . Quit date: 02/27/1983  Smokeless Tobacco  . 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  ADL UCSD:   Initial Exercise Prescription:     Initial Exercise Prescription - 09/22/15 1500      Date of Initial Exercise RX and Referring Provider   Date 09/22/15   Referring Provider Jenkins Rouge MD     Treadmill   MPH 2.5   Grade 0   Minutes 10   METs 2.91     Bike   Level 0.5   Minutes 10   METs 2.26     NuStep   Level 3   Minutes 10   METs 2     Prescription Details   Frequency (times per week) 3   Duration Progress to 30 minutes of continuous aerobic without signs/symptoms of physical distress     Intensity   THRR 40-80% of Max Heartrate 59-118   Ratings of Perceived Exertion 11-13   Perceived Dyspnea 0-4     Progression   Progression Continue to progress workloads to maintain intensity without signs/symptoms of physical distress.     Resistance Training   Training Prescription Yes   Weight 2lbs   Reps 10-12      Discharge Exercise Prescription (Final Exercise Prescription Changes):     Exercise Prescription Changes - 11/24/15 1600      Exercise Review   Progression Yes      Response to Exercise   Blood Pressure (Admit) 120/80   Blood Pressure (Exercise) 160/80  on airdyne moved to recumbent bike, rechk BP 164/84   Blood Pressure (Exit) 120/80   Heart Rate (Admit) 75 bpm   Heart Rate (Exercise) 125 bpm   Heart Rate (Exit) 74 bpm   Perceived Dyspnea (Exercise) 10   Duration Progress to 30 minutes of continuous aerobic without signs/symptoms of physical distress   Intensity THRR unchanged     Progression   Progression Continue to progress workloads to maintain intensity without signs/symptoms of physical distress.   Average METs 4.8     Resistance Training   Training Prescription Yes   Weight 5lbs   Reps 10-12     Treadmill   MPH 3   Grade 2   Minutes 10   METs 4.12     Recumbant Bike   Level 3   Minutes 10   METs 3     NuStep   Level 4   Minutes 10   METs 5.4     Home Exercise Plan   Plans to continue exercise at Home  Reviewed on 10/07/15 see progress note    Frequency Add 3 additional days to program  exercise sessions.      Functional Capacity:     6 Minute Walk    Row Name 09/22/15 1556 11/24/15 1631       6 Minute Walk   Phase Initial Discharge    Distance 1620 feet 1737 feet    Distance % Change  - 7.22 %    Walk Time 6 minutes 6 minutes    # of Rest Breaks 0 0    MPH 3.1 3.3    METS 3.2 3.4    RPE 11 9    VO2 Peak 11.04 11.85    Symptoms No No    Resting HR 60 bpm 71 bpm    Resting BP 138/78 120/70    Max Ex. HR 98 bpm 96 bpm    Max Ex. BP 132/76 140/70    2 Minute Post BP 128/72 132/72       Psychological, QOL, Others - Outcomes: PHQ 2/9: Depression screen [facility] 2/9 11/18/2015 09/26/2015 01/17/2015 01/14/2014  Decreased Interest 0 0 0 0  Down, Depressed, Hopeless 0 0 0 0  PHQ - 2 Score 0 0 0 0    Quality of Life:     Quality of Life - 11/24/15 1634      Quality of Life Scores   Health/Function Post 25.93 %   Socioeconomic Post 25.64 %   Psych/Spiritual Post 25.36 %   Family Post 27 %   GLOBAL  Post 25.88 %      Personal Goals: Goals established at orientation with interventions provided to work toward goal.     Personal Goals and Risk Factors at Admission - 09/22/15 0851      Core Components/Risk Factors/Patient Goals on Admission    Weight Management Yes;Weight Loss   Intervention Weight Management: Develop a combined nutrition and exercise program designed to reach desired caloric intake, while maintaining appropriate intake of nutrient and fiber, sodium and fats, and appropriate energy expenditure required for the weight goal.;Weight Management: Provide education and appropriate resources to help participant work on and attain dietary goals.;Weight Management/Obesity: Establish reasonable short term and long term weight goals.;Obesity: Provide education and appropriate resources to help participant work on and attain dietary goals.   Expected Outcomes Short Term: Continue to assess and modify interventions until short term weight is achieved;Weight Maintenance: Understanding of the daily nutrition guidelines, which includes 25-35% calories from fat, 7% or less cal from saturated fats, less than 200mg  cholesterol, less than 1.5gm of sodium, & 5 or more servings of fruits and vegetables daily;Long Term: Adherence to nutrition and physical activity/exercise program aimed toward attainment of established weight goal;Weight Loss: Understanding of general recommendations for a balanced deficit meal plan, which promotes 1-2 lb weight loss per week and includes a negative energy balance of [phone] kcal/d;Understanding recommendations for meals to include 15-35% energy as protein, 25-35% energy from fat, 35-60% energy from carbohydrates, less than 200mg  of dietary cholesterol, 20-35 gm of total fiber daily;Understanding of distribution of calorie intake throughout the day with the consumption of 4-5 meals/snacks   Increase Strength and Stamina Yes   Intervention Provide advice, education,  support and counseling about physical activity/exercise needs.;Develop an individualized exercise prescription for aerobic and resistive training based on initial evaluation findings, risk stratification, comorbidities and participant's personal goals.   Expected Outcomes Achievement of increased cardiorespiratory fitness and enhanced flexibility, muscular endurance and strength shown through measurements of functional capacity and personal statement of participant.   Diabetes Yes   Intervention Provide education about signs/symptoms  and action to take for hypo/hyperglycemia.;Provide education about proper nutrition, including hydration, and aerobic/resistive exercise prescription along with prescribed medications to achieve blood glucose in normal ranges: Fasting glucose 65-99 mg/dL   Expected Outcomes Long Term: Attainment of HbA1C < 7%.;Short Term: Participant verbalizes understanding of the signs/symptoms and immediate care of hyper/hypoglycemia, proper foot care and importance of medication, aerobic/resistive exercise and nutrition plan for blood glucose control.   Hypertension Yes   Intervention Provide education on lifestyle modifcations including regular physical activity/exercise, weight management, moderate sodium restriction and increased consumption of fresh fruit, vegetables, and low fat dairy, alcohol moderation, and smoking cessation.;Monitor prescription use compliance.   Expected Outcomes Short Term: Continued assessment and intervention until BP is < 140/72mm HG in hypertensive participants. < 130/73mm HG in hypertensive participants with diabetes, heart failure or chronic kidney disease.;Long Term: Maintenance of blood pressure at goal levels.   Lipids Yes   Intervention Provide education and support for participant on nutrition & aerobic/resistive exercise along with prescribed medications to achieve LDL 70mg , HDL >40mg .   Expected Outcomes Short Term: Participant states understanding  of desired cholesterol values and is compliant with medications prescribed. Participant is following exercise prescription and nutrition guidelines.;Long Term: Cholesterol controlled with medications as prescribed, with individualized exercise RX and with personalized nutrition plan. Value goals: LDL < 70mg , HDL > 40 mg.   Personal Goal Other Yes   Personal Goal build upper body strength, know what to do safely. Be in overall good health and be able to live without restrictions/limitations   Intervention Provide exercise programming that will assist with increasing strength/endurance and overall exercise capacity and confidence   Expected Outcomes Pt will be able to have better understanding on what to do per heart condition, and build strength. Overall be in good health       Personal Goals Discharge:     Goals and Risk Factor Review    Row Name 10/03/15 1049 11/04/15 1012 11/28/15 1021         Core Components/Risk Factors/Patient Goals Review   Personal Goals Review Increase Strength and Stamina Increase Strength and Stamina Weight Management/Obesity     Review Very little difference with upper body strength.  Have noticed an increased strength and stamina in heart muscle Pt stated that UE is getting stronger and is feeling much better Pt did not lose wt while in the program. Pt maintained his wt and made significant changes to his dietary habits.     Expected Outcomes Pt upper body strength will get stronger and cardiovascular fitness will continue to improve Pt will continue to increase strength and improve cardiovascular fitness Continue with current nutrition and exercise plan.         Nutrition & Weight - Outcomes:     Pre Biometrics - 09/22/15 1601      Pre Biometrics   Waist Circumference 38 inches   Hip Circumference 38 inches   Waist to Hip Ratio 1 %   Triceps Skinfold 16 mm   % Body Fat 28.7 %   Grip Strength 39 kg   Flexibility 11.75 in   Single Leg Stand 13.31  seconds         Post Biometrics - 11/24/15 1637       Post  Biometrics   Weight 172 lb 6.4 oz (78.2 kg)   Waist Circumference 39 inches   Hip Circumference 37.5 inches   Waist to Hip Ratio 1.04 %   Triceps Skinfold 11 mm   % Body  Fat 27.3 %   Grip Strength 46 kg   Flexibility 12 in   Single Leg Stand 3.69 seconds      Nutrition:     Nutrition Therapy & Goals - 09/26/15 1055      Nutrition Therapy   Diet Carb Modified, Therapeutic Lifestyle Change     Personal Nutrition Goals   Personal Goal #1 1-2 lb wt loss/week to a goal wt loss of 6-24 lb at graduation from [place], educate and counsel regarding individualized specific dietary modifications aiming towards targeted core components such as weight, hypertension, lipid management, diabetes, heart failure and other comorbidities.   Expected Outcomes Short Term Goal: Understand basic principles of dietary content, such as calories, fat, sodium, cholesterol and nutrients.;Long Term Goal: Adherence to prescribed nutrition plan.      Nutrition Discharge:     Nutrition Assessments - 11/18/15 1217      MEDFICTS Scores   Pre Score 12   Post Score 0   Score Difference -12      Education Questionnaire Score:     Knowledge Questionnaire Score - 11/18/15 0859      Knowledge Questionnaire Score   Post Score 22/24      Goals reviewed with patient; copy given to patient.

## 2015-11-24 ENCOUNTER — Encounter: Payer: Self-pay | Admitting: Internal Medicine

## 2015-11-24 LAB — HM DIABETES EYE EXAM

## 2016-01-18 ENCOUNTER — Encounter: Payer: Self-pay | Admitting: Internal Medicine

## 2016-01-18 ENCOUNTER — Ambulatory Visit (INDEPENDENT_AMBULATORY_CARE_PROVIDER_SITE_OTHER): Payer: 59 | Admitting: Internal Medicine

## 2016-01-18 ENCOUNTER — Other Ambulatory Visit (INDEPENDENT_AMBULATORY_CARE_PROVIDER_SITE_OTHER): Payer: 59

## 2016-01-18 VITALS — BP 160/88 | HR 70 | Temp 97.9°F | Wt 174.0 lb

## 2016-01-18 DIAGNOSIS — Z23 Encounter for immunization: Secondary | ICD-10-CM | POA: Diagnosis not present

## 2016-01-18 DIAGNOSIS — E119 Type 2 diabetes mellitus without complications: Secondary | ICD-10-CM | POA: Diagnosis not present

## 2016-01-18 DIAGNOSIS — Z0000 Encounter for general adult medical examination without abnormal findings: Secondary | ICD-10-CM | POA: Diagnosis not present

## 2016-01-18 DIAGNOSIS — N183 Chronic kidney disease, stage 3 unspecified: Secondary | ICD-10-CM

## 2016-01-18 DIAGNOSIS — I10 Essential (primary) hypertension: Secondary | ICD-10-CM

## 2016-01-18 LAB — BASIC METABOLIC PANEL
BUN: 16 mg/dL (ref 6–23)
CALCIUM: 10.1 mg/dL (ref 8.4–10.5)
CHLORIDE: 104 meq/L (ref 96–112)
CO2: 27 meq/L (ref 19–32)
Creatinine, Ser: 1.33 mg/dL (ref 0.40–1.50)
GFR: 56.08 mL/min — ABNORMAL LOW (ref >60.00)
GLUCOSE: 140 mg/dL — AB (ref 70–99)
POTASSIUM: 4.9 meq/L (ref 3.5–5.1)
SODIUM: 141 meq/L (ref 135–145)

## 2016-01-18 LAB — HEMOGLOBIN A1C: Hgb A1c MFr Bld: 7 % — ABNORMAL HIGH (ref 4.6–6.5)

## 2016-01-18 MED ORDER — EZETIMIBE-SIMVASTATIN 10-80 MG PO TABS: 1.0000 | ORAL_TABLET | Freq: Every day | ORAL | 3 refills | Status: DC

## 2016-01-18 MED ORDER — METOPROLOL SUCCINATE ER 25 MG PO TB24: 25.0000 mg | ORAL_TABLET | Freq: Every day | ORAL | 3 refills | Status: DC

## 2016-01-18 MED ORDER — LISINOPRIL 10 MG PO TABS: 5.0000 mg | ORAL_TABLET | Freq: Every day | ORAL | 3 refills | Status: DC

## 2016-01-18 NOTE — Patient Instructions (Signed)
We have given you the flu shot today.  Take the rest of the lopressor (metoprolol). We have sent in the long acting metoprolol. When you start taking this take 1 pill daily.  We have sent in lisinopril. Take 1/2 pill daily (5 mg daily) to help protect the kidneys long term.  It is okay to check the sugars only as needed.   Health Maintenance, Male A healthy lifestyle and preventative care can promote health and wellness.  Maintain regular health, dental, and eye exams.  Eat a healthy diet. Foods like vegetables, fruits, whole grains, low-fat dairy products, and lean protein foods contain the nutrients you need and are low in calories. Decrease your intake of foods high in solid fats, added sugars, and salt. Get information about a proper diet from your health care provider, if necessary.  Regular physical exercise is one of the most important things you can do for your health. Most adults should get at least 150 minutes of moderate-intensity exercise (any activity that increases your heart rate and causes you to sweat) each week. In addition, most adults need muscle-strengthening exercises on 2 or more days a week.   Maintain a healthy weight. The body mass index (BMI) is a screening tool to identify possible weight problems. It provides an estimate of body fat based on height and weight. Your health care provider can find your BMI and can help you achieve or maintain a healthy weight. For males 20 years and older:  A BMI below 18.5 is considered underweight.  A BMI of 18.5 to 24.9 is normal.  A BMI of 25 to 29.9 is considered overweight.  A BMI of 30 and above is considered obese.  Maintain normal blood lipids and cholesterol by exercising and minimizing your intake of saturated fat. Eat a balanced diet with plenty of fruits and vegetables. Blood tests for lipids and cholesterol should begin at age 38 and be repeated every 5 years. If your lipid or cholesterol levels are high, you are  over age 41, or you are at high risk for heart disease, you may need your cholesterol levels checked more frequently.Ongoing high lipid and cholesterol levels should be treated with medicines if diet and exercise are not working.  If you smoke, find out from your health care provider how to quit. If you do not use tobacco, do not start.  Lung cancer screening is recommended for adults aged 18-80 years who are at high risk for developing lung cancer because of a history of smoking. A yearly low-dose CT scan of the lungs is recommended for people who have at least a 30-pack-year history of smoking and are current smokers or have quit within the past 15 years. A pack year of smoking is smoking an average of 1 pack of cigarettes a day for 1 year (for example, a 30-pack-year history of smoking could mean smoking 1 pack a day for 30 years or 2 packs a day for 15 years). Yearly screening should continue until the smoker has stopped smoking for at least 15 years. Yearly screening should be stopped for people who develop a health problem that would prevent them from having lung cancer treatment.  If you choose to drink alcohol, do not have more than 2 drinks per day. One drink is considered to be 12 oz (360 mL) of beer, 5 oz (150 mL) of wine, or 1.5 oz (45 mL) of liquor.  Avoid the use of street drugs. Do not share needles with anyone. Ask  for help if you need support or instructions about stopping the use of drugs.  High blood pressure causes heart disease and increases the risk of stroke. High blood pressure is more likely to develop in:  People who have blood pressure in the end of the normal range (100-139/85-89 mm Hg).  People who are overweight or obese.  People who are African American.  If you are 18-64 years of age, have your blood pressure checked every 3-5 years. If you are 67 years of age or older, have your blood pressure checked every year. You should have your blood pressure measured  twice-once when you are at a hospital or clinic, and once when you are not at a hospital or clinic. Record the average of the two measurements. To check your blood pressure when you are not at a hospital or clinic, you can use:  An automated blood pressure machine at a pharmacy.  A home blood pressure monitor.  If you are 45-38 years old, ask your health care provider if you should take aspirin to prevent heart disease.  Diabetes screening involves taking a blood sample to check your fasting blood sugar level. This should be done once every 3 years after age 53 if you are at a normal weight and without risk factors for diabetes. Testing should be considered at a younger age or be carried out more frequently if you are overweight and have at least 1 risk factor for diabetes.  Colorectal cancer can be detected and often prevented. Most routine colorectal cancer screening begins at the age of 63 and continues through age 18. However, your health care provider may recommend screening at an earlier age if you have risk factors for colon cancer. On a yearly basis, your health care provider may provide home test kits to check for hidden blood in the stool. A small camera at the end of a tube may be used to directly examine the colon (sigmoidoscopy or colonoscopy) to detect the earliest forms of colorectal cancer. Talk to your health care provider about this at age 17 when routine screening begins. A direct exam of the colon should be repeated every 5-10 years through age 84, unless early forms of precancerous polyps or small growths are found.  People who are at an increased risk for hepatitis B should be screened for this virus. You are considered at high risk for hepatitis B if:  You were born in a country where hepatitis B occurs often. Talk with your health care provider about which countries are considered high risk.  Your parents were born in a high-risk country and you have not received a shot to  protect against hepatitis B (hepatitis B vaccine).  You have HIV or AIDS.  You use needles to inject street drugs.  You live with, or have sex with, someone who has hepatitis B.  You are a man who has sex with other men (MSM).  You get hemodialysis treatment.  You take certain medicines for conditions like cancer, organ transplantation, and autoimmune conditions.  Hepatitis C blood testing is recommended for all people born from [birth year] through 1967 and any individual with known risk factors for hepatitis C.  Healthy men should no longer receive prostate-specific antigen (PSA) blood tests as part of routine cancer screening. Talk to your health care provider about prostate cancer screening.  Testicular cancer screening is not recommended for adolescents or adult males who have no symptoms. Screening includes self-exam, a health care provider exam, and other  screening tests. Consult with your health care provider about any symptoms you have or any concerns you have about testicular cancer.  Practice safe sex. Use condoms and avoid high-risk sexual practices to reduce the spread of sexually transmitted infections (STIs).  You should be screened for STIs, including gonorrhea and chlamydia if:  You are sexually active and are younger than 24 years.  You are older than 24 years, and your health care provider tells you that you are at risk for this type of infection.  Your sexual activity has changed since you were last screened, and you are at an increased risk for chlamydia or gonorrhea. Ask your health care provider if you are at risk.  If you are at risk of being infected with HIV, it is recommended that you take a prescription medicine daily to prevent HIV infection. This is called pre-exposure prophylaxis (PrEP). You are considered at risk if:  You are a man who has sex with other men (MSM).  You are a heterosexual man who is sexually active with multiple partners.  You take drugs by  injection.  You are sexually active with a partner who has HIV.  Talk with your health care provider about whether you are at high risk of being infected with HIV. If you choose to begin PrEP, you should first be tested for HIV. You should then be tested every 3 months for as long as you are taking PrEP.  Use sunscreen. Apply sunscreen liberally and repeatedly throughout the day. You should seek shade when your shadow is shorter than you. Protect yourself by wearing long sleeves, pants, a wide-brimmed hat, and sunglasses year round whenever you are outdoors.  Tell your health care provider of new moles or changes in moles, especially if there is a change in shape or color. Also, tell your health care provider if a mole is larger than the size of a pencil eraser.  A one-time screening for abdominal aortic aneurysm (AAA) and surgical repair of large AAAs by ultrasound is recommended for men aged 28-75 years who are current or former smokers.  Stay current with your vaccines (immunizations). This information is not intended to replace advice given to you by your health care provider. Make sure you discuss any questions you have with your health care provider. Document Released: 08/11/2007 Document Revised: 03/05/2014 Document Reviewed: 11/16/2014 Elsevier Interactive Patient Education  2017 [employer].

## 2016-01-18 NOTE — Assessment & Plan Note (Signed)
Flu shot given at visit, got eye exam we just need the records. Pneumonia series done and shingles done. Tetanus up to date. He is exercising well. Counseled about sun safety and mole surveillance and dangers of distracted driving. Given 10 year screening recommendations.

## 2016-01-18 NOTE — Progress Notes (Signed)
Pre visit review using our clinic review tool, if applicable. No additional management support is needed unless otherwise documented below in the visit note. 

## 2016-01-18 NOTE — Assessment & Plan Note (Signed)
Checking BMP and adding ACE-I.

## 2016-01-18 NOTE — Progress Notes (Signed)
   Subjective:    Patient ID: Clifford [name], male    DOB: Jan 31, 1946, 70 y.o.   MRN: [id number]  HPI The patient is a 70 YO man coming in for wellness. No new concerns. Sept 28th Dr. Bing Plume  PMH, [facility], social history reviewed and updated.   Review of Systems  Constitutional: Negative.   HENT: Negative.   Eyes: Negative.   Respiratory: Negative for cough, chest tightness and shortness of breath.   Cardiovascular: Negative for chest pain, palpitations and leg swelling.  Gastrointestinal: Negative for abdominal distention, abdominal pain, constipation, diarrhea, nausea and vomiting.  Musculoskeletal: Negative.   Skin: Negative.   Neurological: Negative.   Psychiatric/Behavioral: Negative.       Objective:   Physical Exam  Constitutional: He is oriented to person, place, and time. He appears well-developed and well-nourished.  HENT:  Head: Normocephalic and atraumatic.  Eyes: EOM are normal.  Neck: Normal range of motion.  Cardiovascular: Normal rate and regular rhythm.   Pulmonary/Chest: Effort normal and breath sounds normal. No respiratory distress. He has no wheezes. He has no rales.  Abdominal: Soft. Bowel sounds are normal. He exhibits no distension. There is no tenderness. There is no rebound.  Musculoskeletal: He exhibits no edema.  Neurological: He is alert and oriented to person, place, and time. Coordination normal.  Skin: Skin is warm and dry.  Foot exam done  Psychiatric: He has a normal mood and affect.   Vitals:   01/18/16 0755  BP: (!) 158/80  Pulse: 70  Temp: 97.9 F (36.6 C)  SpO2: 99%  Weight: 174 lb (78.9 kg)      Assessment & Plan:  Flu shot given at visit.

## 2016-01-18 NOTE — Assessment & Plan Note (Signed)
Checking HgA1c and foot exam done. Eye exam at [facility] sept 2017 and we will get records. Not complicated as his pvd came before the diabetes. Controlled with diet at this time.

## 2016-01-18 NOTE — Assessment & Plan Note (Signed)
BP at goal at home. Change lopressor to metoprolol xl 25 mg daily and add lisinopril 5 mg daily.

## 2016-04-04 NOTE — Progress Notes (Signed)
Cardiology Office Note:    Date:  04/16/2016   ID:  MAKS CAVALLERO, DOB 07-04-1945, MRN [id number]  PCP:  Hoyt Koch, MD  Cardiologist:  Dr. Jenkins Rouge   Electrophysiologist:  n/a  Referring MD: Hoyt Koch, *   Post CABG F/U  History of Present Illness:     Clifford [name] is a 71 y.o. male with a hx of CAD status post prior PCI in 1995, 2000 and 2002, diabetes, HTN, HL, prostate CA status post prostatectomy.    Admitted 5/6-5/18/17  with unstable angina. Cardiac enzymes remained negative. Cardiac catheterization demonstrated 3 vessel CAD and CABG was recommended. He was seen by Dr. Servando Snare and underwent CABG with LIMA-LAD, SVG-PLVB, SVG-DX. Postoperative course was notable for volume excess as well as worsening creatinine. Creatinine improved prior to discharge. He was placed on ACE inhibitor. He remained in NSR.  Had a  small right groin pseudoaneurysm after cath .    Past Medical History:  Diagnosis Date  . CAD (coronary artery disease) 2005   a. Previous stents RCA and circ, Cath 2005 patient with 70% PDA  //  b. [place] 5/17 >> LHC oLCx 50, pLAD 50, mLAD 95, RPDA 80, normal LVEF >> s/p CABG  . Carotid artery disease ([place])    a. Carotid US 5/17 bilat ICA 1-39%  . Cataract    replace 11/2012  . Diabetes mellitus    diet controlled  . History of detached retina repair 11-06-2006   [facility]  . History of echocardiogram    a. EF 55% to 60%. Wall motion was normal, Gr 1 DD, Trivial MR/TR  . History of hematuria   . History of prostate cancer 03/2005   s/p prostatectomy  . Hyperlipidemia   . Hypertension     Past Surgical History:  Procedure Laterality Date  . ANGIOPLASTY     1995,2000,2002  . CARDIAC CATHETERIZATION  05.31.2002   Initially the stenosis in the proximal to mid right coronary artery was estimated ar 95%. Following stenting, this improved to 0%. There was residual mild disease in the sostium of the proximal right coronary atrtery.  Successful stentng of the proximal to mid right coronary artery with improvement in stent diameter narrowing from 95% to 0% Proc Date  07/29/2000  . CARDIAC CATHETERIZATION N/A 07/04/2015   Procedure: Left Heart Cath and Coronary Angiography;  Surgeon: Jettie Booze, MD;  Location: [place] [facility];  Service: Cardiovascular;  Laterality: N/A;  . CATARACT EXTRACTION W/ INTRAOCULAR LENS IMPLANT Bilateral OS Sept '14; OD Oct '14   Dr. Bing Plume  . COLONOSCOPY    . CORONARY ARTERY BYPASS GRAFT N/A 07/08/2015   Procedure: CORONARY ARTERY BYPASS GRAFTING (CABG)x three using left internal mammary artery to left anterior decending artery, left greater saphenous vein grafts to diagonal and posterolateral. Left leg greater saphenous leg vein via endoscope. ;  Surgeon: [name], MD;  Location: [facility];  Service: Open Heart Surgery;  Laterality: N/A;  . CYSTOSCOPY N/A 07/08/2015   Procedure: CYSTOSCOPY FLEXIBLE WITH BALLOON DILATION OF BLADDER NECK CONTRACTURE WITH DIFFICULT CATHETER PLACEMENT;  Surgeon: Raynelle Bring, MD;  Location: [place];  Service: Urology;  Laterality: N/A;  . EYE SURGERY  11/24/2012   left eye cataract  . EYE SURGERY  12/22/2012   right eye cataract  . HEMORRHOID SURGERY     I&D of thrombosed hemorrhoid  . LIPOMA EXCISION    . PROSTATECTOMY  03/2005  . PTCA  2005  .  TEE WITHOUT CARDIOVERSION N/A 07/08/2015   Procedure: TRANSESOPHAGEAL ECHOCARDIOGRAM (TEE);  Surgeon: [name], MD;  Location: [place];  Service: Open Heart Surgery;  Laterality: N/A;  . WRIST GANGLION EXCISION      Current Medications: Outpatient Medications Prior to Visit  Medication Sig Dispense Refill  . Ascorbic Acid (VITAMIN C PO) Take 1 tablet by mouth daily.    Marland Kitchen aspirin EC 81 MG tablet Take 81 mg by mouth once.     . blood glucose meter kit and supplies Dispense based on patient and insurance preference. Use up to four times daily as directed. (FOR ICD-9 250.00, 250.01). 1 each 0  .  ezetimibe-simvastatin (VYTORIN) 10-80 MG tablet Take 1 tablet by mouth daily. 90 tablet 3  . lisinopril (PRINIVIL,ZESTRIL) 10 MG tablet Take 0.5 tablets (5 mg total) by mouth daily. 45 tablet 3  . metoprolol succinate (TOPROL-XL) 25 MG 24 hr tablet Take 1 tablet (25 mg total) by mouth daily. 90 tablet 3  . Multiple Vitamin (MULTIVITAMIN) tablet Take 1 tablet by mouth daily.      . ONE TOUCH ULTRA TEST test strip 1 each by Other route 2 (two) times daily. Use to check blood sugars twice a day Dx E11.9 100 each 5  . ONETOUCH DELICA LANCETS 67T MISC Use to help check blood sugars twice a day Dx e11.9 100 each 5  . metoprolol tartrate (LOPRESSOR) 25 MG tablet Take 1 tablet (25 mg total) by mouth 2 (two) times daily. 180 tablet 3   No facility-administered medications prior to visit.       Allergies:   Cinnamon   Social History   Social History  . Marital status: Married    Spouse name: N/A  . Number of children: N/A  . Years of education: N/A   Social History Main Topics  . Smoking status: Former Smoker    Quit date: 02/27/1983  . Smokeless tobacco: Never Used  . Alcohol use No  . Drug use: No  . Sexual activity: Yes    Partners: Female   Other Topics Concern  . 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 ACP - discussed with patient and provided packet and referral to [email or web address] Nov '15. He already has stated that if he is :"comatose" pull the plug.      Family History:  The patient's family history includes CAD in his father; Colon polyps in his sister; Diabetes in his sister.   ROS:   Please see the history of present illness.    Review of Systems  Cardiovascular: Positive for chest pain and leg swelling.  Gastrointestinal: Positive for constipation.   All other systems reviewed and are negative.   Physical Exam:    VS:  BP (!)  160/96   Pulse 85   Ht '5\' 4"'  (1.626 m)   Wt 180 lb (81.6 kg)   SpO2 97%   BMI 30.90 kg/m    Physical Exam  Constitutional: He is oriented to person, place, and time. He appears well-developed and well-nourished.  HENT:  Head: Normocephalic and atraumatic.  Neck: Normal range of motion. Neck supple. No JVD present.  Cardiovascular: Normal rate, regular rhythm and normal heart sounds.  Exam reveals no friction rub.   No murmur heard. R groin without pulsatile mass or bruit and no significant tenderness R leg trace-1+ ankle edema  L leg with 1-2+ edema, calf soft and nontender L leg vein harvesting site well healed without erythema or discharge  Pulmonary/Chest: Effort normal and breath sounds normal. He has no wheezes. He has no rales.  Median sternotomy well healed without erythema or discharge  Abdominal: Soft. He exhibits no distension and no mass. There is no tenderness.  Musculoskeletal: Normal range of motion.  Neurological: He is alert and oriented to person, place, and time.  Skin: Skin is warm and dry.  Psychiatric: He has a normal mood and affect.    Wt Readings from Last 3 Encounters:  04/16/16 180 lb (81.6 kg)  01/18/16 174 lb (78.9 kg)  11/24/15 172 lb 6.4 oz (78.2 kg)      Studies/Labs Reviewed:     EKG:  2015/08/11   NSR, HR 73, leftward axis, poor R-wave progression, QTc 438 ms, no significant changes  Recent Labs: 07/09/2015: Magnesium 2.3 07/13/2015: Hemoglobin 8.5; Platelets 307 09/12/2015: ALT 16 01/18/2016: BUN 16; Creatinine, Ser 1.33; Potassium 4.9; Sodium 141   Recent Lipid Panel    Component Value Date/Time   CHOL 118 07/03/2015 0255   TRIG 120 07/03/2015 0255   TRIG 133 02/25/2006 0822   HDL 43 07/03/2015 0255   CHOLHDL 2.7 07/03/2015 0255   VLDL 24 07/03/2015 0255   LDLCALC 51 07/03/2015 0255    Additional studies/ records that were reviewed today include:   Intraop TEE 07/08/15 EF 55% to 60%. Wall motion was normal  Carotid US  07/06/15 Bilaterally - 1% to 39% ICA stenosis. Vertebral artery flow is antegrade.  Echo 07/06/15 EF 55% to 60%. Wall motion was normal, Gr 1 DD, Trivial MR/TR  LHC 07/04/15  Ost Cx lesion, 50% stenosed.  Mid LAD lesion, 95% stenosed. Severe, diffuse disease in the mid to distal LAD which is all heavily calcified. The origin of a moderate-sized diagonal is also compromised.  Prox LAD to Mid LAD lesion, 50% stenosed.  RPDA lesion, 80% stenosed.  The left ventricular systolic function is normal.  Normal LVEDP.   ASSESSMENT:     1. Bilateral carotid artery disease ([place])     PLAN:     In order of problems listed above:  1. CABG:  07/08/15  Cath sight pseudoaneurysm healed and SVG harvest sights better continue Beta blocker at lower dose  and ASA  Did well with cardiac rehab  2. HTN - Better at home but seems elevated increase zestril to 10 mg  3. HL - Continue high-dose statin. Recent LDL 51.  4. CKD - Lab Results  Component Value Date   CREATININE 1.33 01/18/2016   BUN 16 01/18/2016   NA 141 01/18/2016   K 4.9 01/18/2016   CL 104 01/18/2016   CO2 27 01/18/2016     5. Carotid artery disease - Mild bilat plaque by [name] at CABG.  Consider repeat US in 1-2 years.  6. DM - FU with PCP for strict control.  He is not on any meds now  Lab Results  Component Value Date   HGBA1C 7.0 (H) 01/18/2016   7. Edema - improved off diuretics now due to CRF and azotemia  08/02/15 duplex no DVT  8. Right groin pseudoaneurysm - This has resolved based upon exam.  He has no RFA site tenderness, pulsatile mass or bruit.     Jenkins Rouge

## 2016-04-09 ENCOUNTER — Encounter: Payer: Self-pay | Admitting: Cardiovascular Disease

## 2016-04-16 ENCOUNTER — Ambulatory Visit (INDEPENDENT_AMBULATORY_CARE_PROVIDER_SITE_OTHER): Payer: 59 | Admitting: Cardiovascular Disease

## 2016-04-16 ENCOUNTER — Encounter: Payer: Self-pay | Admitting: Cardiovascular Disease

## 2016-04-16 VITALS — BP 160/96 | HR 85 | Ht 64.0 in | Wt 180.0 lb

## 2016-04-16 DIAGNOSIS — I779 Disorder of arteries and arterioles, unspecified: Secondary | ICD-10-CM | POA: Diagnosis not present

## 2016-04-16 DIAGNOSIS — I739 Peripheral vascular disease, unspecified: Principal | ICD-10-CM

## 2016-04-16 MED ORDER — LISINOPRIL 10 MG PO TABS: 10.0000 mg | ORAL_TABLET | Freq: Every day | ORAL | 3 refills | Status: DC

## 2016-04-16 NOTE — Patient Instructions (Addendum)
Medication Instructions:  Your physician has recommended you make the following change in your medication:  1-Increase Zestril to 10 mg by mouth daily.   Labwork: NONE  Testing/Procedures: NONE  Follow-Up: Your physician wants you to follow-up in: 6 months with Dr. Johnsie Cancel. You will receive a reminder letter in the mail two months in advance. If you don't receive a letter, please call our office to schedule the follow-up appointment.   If you need a refill on your cardiac medications before your next appointment, please call your pharmacy.

## 2016-04-16 NOTE — Addendum Note (Signed)
Addended by: Aris Georgia, Holten Spano L on: 04/16/2016 08:52 AM   Modules accepted: Orders

## 2016-07-17 ENCOUNTER — Other Ambulatory Visit (INDEPENDENT_AMBULATORY_CARE_PROVIDER_SITE_OTHER): Payer: 59

## 2016-07-17 ENCOUNTER — Ambulatory Visit (INDEPENDENT_AMBULATORY_CARE_PROVIDER_SITE_OTHER): Payer: 59 | Admitting: Internal Medicine

## 2016-07-17 ENCOUNTER — Encounter: Payer: Self-pay | Admitting: Internal Medicine

## 2016-07-17 VITALS — BP 142/80 | HR 79 | Temp 97.9°F | Resp 12 | Ht 64.0 in | Wt 176.0 lb

## 2016-07-17 DIAGNOSIS — I10 Essential (primary) hypertension: Secondary | ICD-10-CM

## 2016-07-17 DIAGNOSIS — E119 Type 2 diabetes mellitus without complications: Secondary | ICD-10-CM

## 2016-07-17 DIAGNOSIS — N183 Chronic kidney disease, stage 3 unspecified: Secondary | ICD-10-CM

## 2016-07-17 DIAGNOSIS — E785 Hyperlipidemia, unspecified: Secondary | ICD-10-CM

## 2016-07-17 LAB — COMPREHENSIVE METABOLIC PANEL
ALK PHOS: 47 U/L (ref 39–117)
ALT: 24 U/L (ref 0–53)
AST: 28 U/L (ref 0–37)
Albumin: 4.7 g/dL (ref 3.5–5.2)
BILIRUBIN TOTAL: 1.2 mg/dL (ref 0.2–1.2)
BUN: 14 mg/dL (ref 6–23)
CALCIUM: 9.9 mg/dL (ref 8.4–10.5)
CO2: 28 mEq/L (ref 19–32)
CREATININE: 1.19 mg/dL (ref 0.40–1.50)
Chloride: 103 mEq/L (ref 96–112)
GFR: 63.67 mL/min (ref >60.00)
Glucose, Bld: 127 mg/dL — ABNORMAL HIGH (ref 70–99)
Potassium: 4.4 mEq/L (ref 3.5–5.1)
Sodium: 140 mEq/L (ref 135–145)
Total Protein: 7.5 g/dL (ref 6.0–8.3)

## 2016-07-17 LAB — LIPID PANEL
CHOLESTEROL: 104 mg/dL (ref 0–200)
HDL: 35.2 mg/dL — ABNORMAL LOW (ref >39.00)
LDL Cholesterol: 50 mg/dL (ref 0–99)
NonHDL: 68.87
Total CHOL/HDL Ratio: 3
Triglycerides: 93 mg/dL (ref 0.0–149.0)
VLDL: 18.6 mg/dL (ref 0.0–40.0)

## 2016-07-17 LAB — HEMOGLOBIN A1C: HEMOGLOBIN A1C: 7.5 % — AB (ref 4.6–6.5)

## 2016-07-17 NOTE — Assessment & Plan Note (Signed)
Stable at last check, likely due to blood pressure control over the years. Diabetes diagnosis is recent and has been at goal since diagnosis.

## 2016-07-17 NOTE — Progress Notes (Signed)
   Subjective:    Patient ID: Clifford [name], male    DOB: Oct 14, 1945, 71 y.o.   MRN: [id number]  HPI The patient is a 71 YO man coming in for follow up of his diabetes (working with diet and exercise for sugar control, sugars around 90s when checked in the morning fasting, walks 3-6 miles per day), and his blood pressure (taking metoprolol 25 mg and lisinopril 10 mg daily, no side effects, denies chest pains or SOB), and his cholesterol (goal LDL <100, taking vytorin 10/80 daily without side effects). No new concerns. Denies chest pains or SOB or abdominal pain. No constipation or diarrhea.   Review of Systems  Constitutional: Negative.   HENT: Negative.   Eyes: Negative.   Respiratory: Negative for cough, chest tightness and shortness of breath.   Cardiovascular: Negative for chest pain, palpitations and leg swelling.  Gastrointestinal: Negative for abdominal distention, abdominal pain, constipation, diarrhea, nausea and vomiting.  Musculoskeletal: Negative.   Skin: Negative.   Neurological: Negative.   Psychiatric/Behavioral: Negative.       Objective:   Physical Exam  Constitutional: He is oriented to person, place, and time. He appears well-developed and well-nourished.  HENT:  Head: Normocephalic and atraumatic.  Eyes: EOM are normal.  Neck: Normal range of motion.  Cardiovascular: Normal rate and regular rhythm.   Pulmonary/Chest: Effort normal and breath sounds normal. No respiratory distress. He has no wheezes. He has no rales.  Abdominal: Soft. Bowel sounds are normal. He exhibits no distension. There is no tenderness. There is no rebound.  Musculoskeletal: He exhibits no edema.  Neurological: He is alert and oriented to person, place, and time. Coordination normal.  Skin: Skin is warm and dry.  Psychiatric: He has a normal mood and affect.   Vitals:   07/17/16 0758  BP: (!) 142/80  Pulse: 79  Resp: 12  Temp: 97.9 F (36.6 C)  TempSrc: Oral  SpO2: 98%  Weight:  176 lb (79.8 kg)  Height: 5\' 4"  (1.626 m)      Assessment & Plan:

## 2016-07-17 NOTE — Patient Instructions (Signed)
We are checking the labs today and will send the results to you.

## 2016-07-17 NOTE — Assessment & Plan Note (Signed)
BP close to goal and at goal at home on lisinopril and metoprolol. Takes lisinopril around lunchtime so has not taken yet today. Checking CMP for dose change of lisinopril with cardiology.

## 2016-07-17 NOTE — Assessment & Plan Note (Signed)
Checking lipid panel as due, vytorin 10/80 daily. No side effects.

## 2016-07-17 NOTE — Assessment & Plan Note (Signed)
Controlled with diet and exercise. Checking HgA1c for goal <7.5. On ACE-I and statin.

## 2016-11-25 IMAGING — CR DG CHEST 2V
2 series · 2 of 2 positions shown · non-contrast
Comparison: 07/11/2015 and earlier.

CLINICAL DATA: 70-year-old male status post CABG 5 weeks ago.
Subsequent encounter.

EXAM:
CHEST  2 VIEW

[w chest pa]
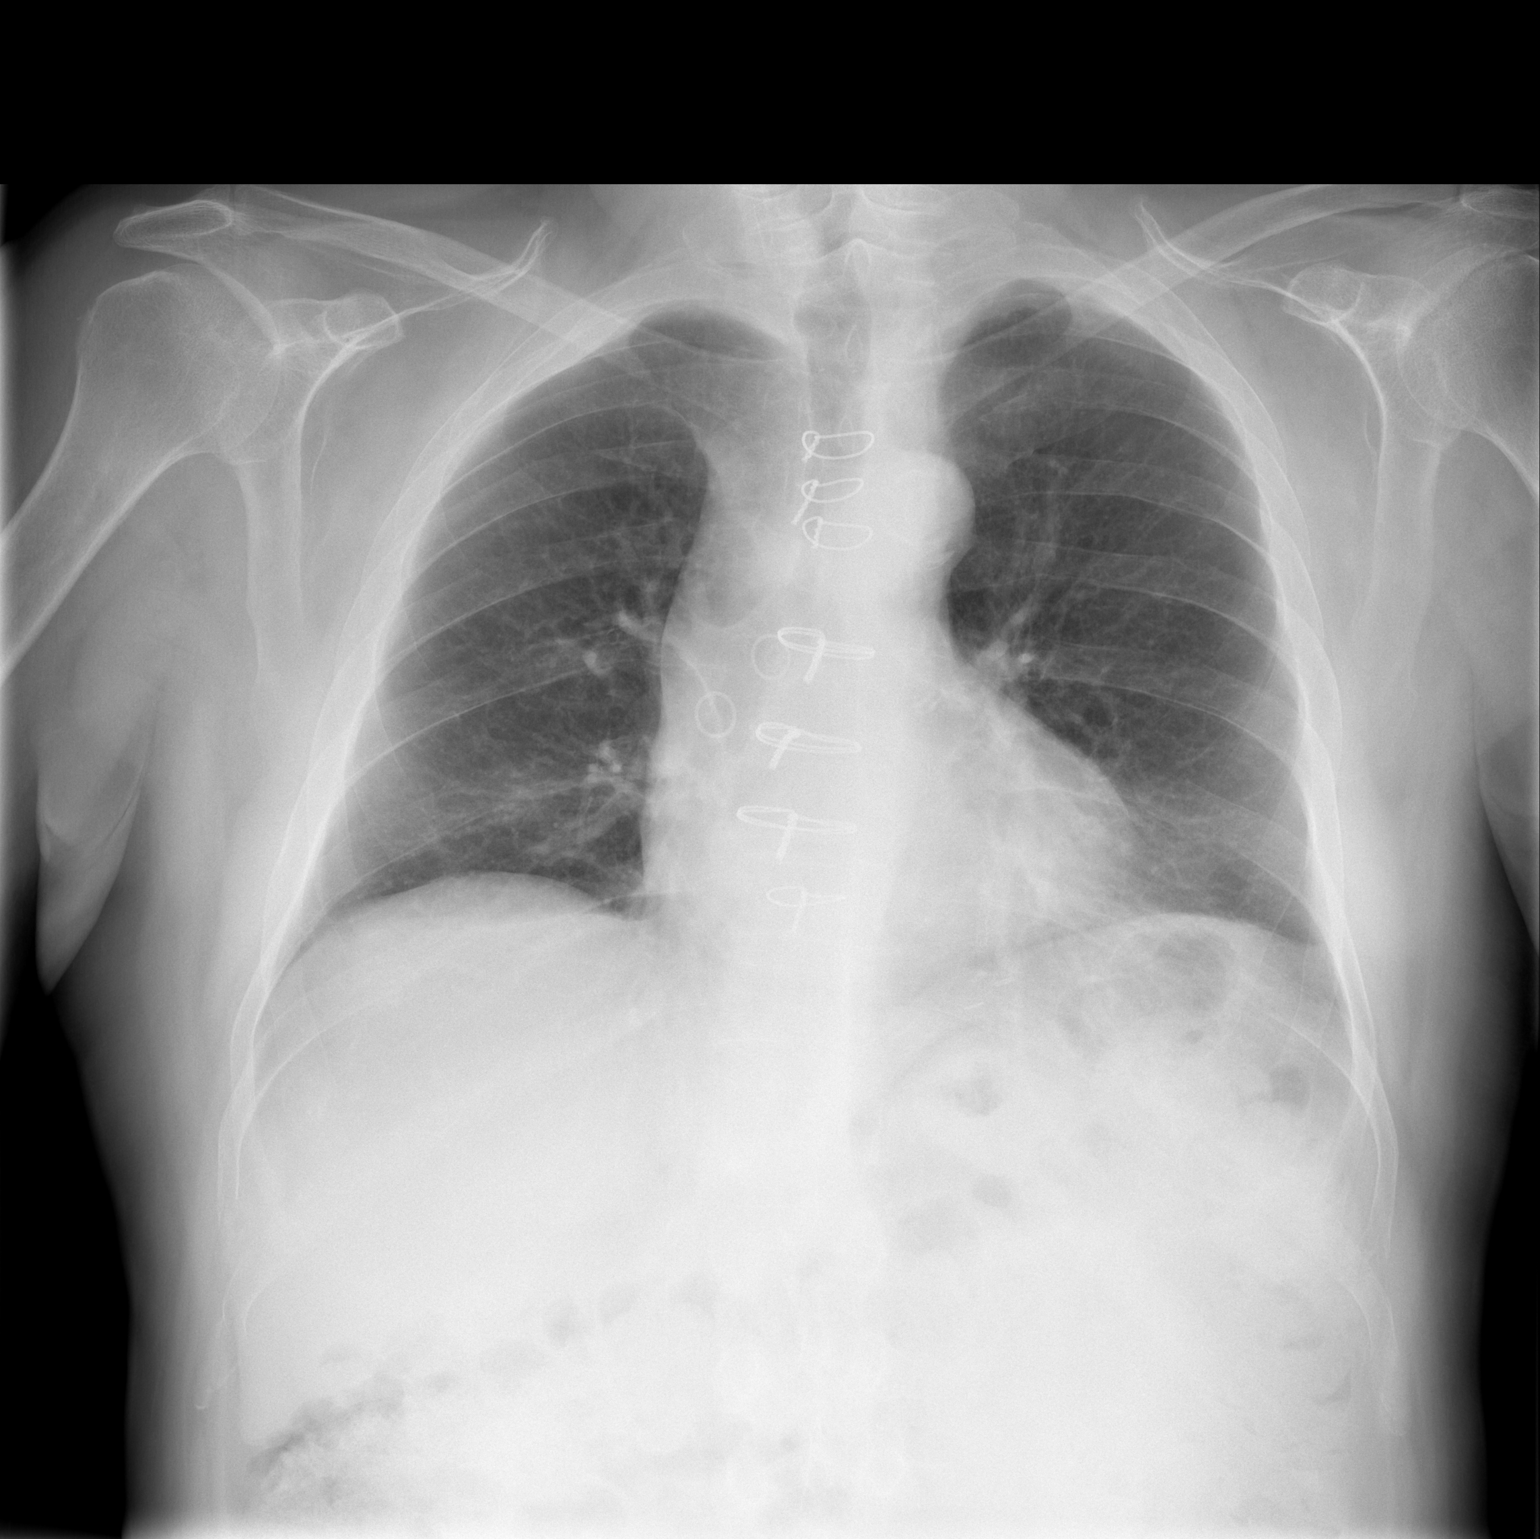

[w chest lat]
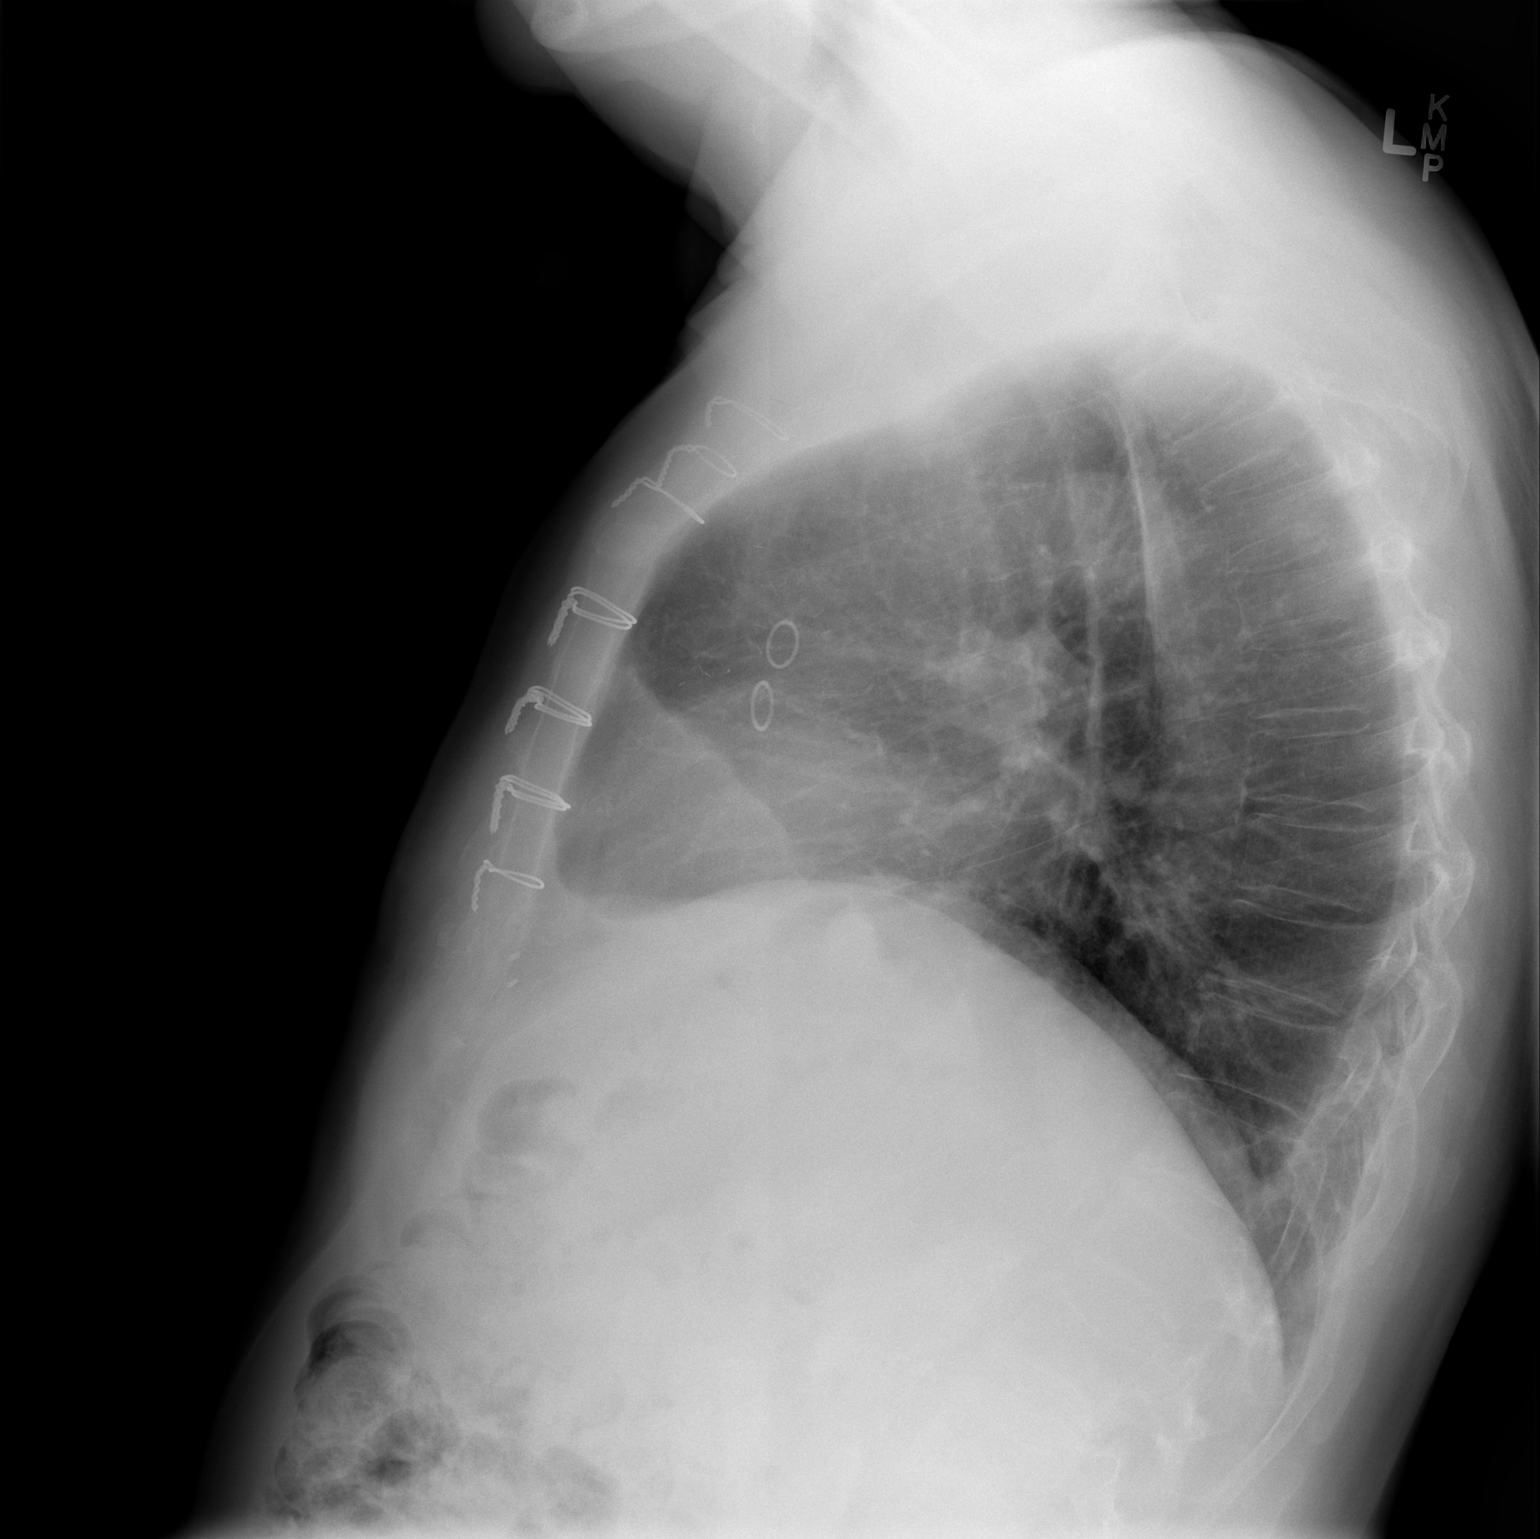

[2 of 2 positions shown; findings below may reference images not displayed]

FINDINGS: Lung volumes at baseline. Mediastinal contours are stable and within
normal limits. Sequelae of CABG. No pneumothorax, pulmonary edema,
pleural effusion or acute pulmonary opacity; minimal to mild
residual left lower lobe streaky opacity. Osteopenia. No acute
osseous abnormality identified.

Calcified aortic atherosclerosis.
IMPRESSION: No acute cardiopulmonary abnormality; minimal residual left lower
lobe atelectasis.

## 2016-12-19 NOTE — Progress Notes (Signed)
Cardiology Office Note:    Date:  12/24/2016   ID:  [name], DOB [birth year], MRN [id number]  PCP:  Clifford Koch, MD  Cardiologist:  Dr. Jenkins [name]   Electrophysiologist:  n/a  Referring MD: Clifford [name], *   Post CABG F/U  History of Present Illness:     Clifford [name] is a 71 y.o. male with a hx of CAD status post prior PCI in 1995, 2000 and 2002, diabetes, HTN, HL, prostate CA status post prostatectomy.    Admitted 5/6-5/18/17  with unstable angina. Cardiac enzymes remained negative. Cardiac catheterization demonstrated 3 vessel CAD and CABG was recommended. He was seen by Dr. Servando [name] and underwent CABG with LIMA-LAD, SVG-PLVB, SVG-DX. Postoperative course was notable for volume excess as well as worsening creatinine.   Had a  small right groin pseudoaneurysm after cath .  BP meds make him sleepy Did not take them this am    Past Medical History:  Diagnosis Date  . CAD (coronary artery disease) 2005   a. Previous stents RCA and circ, Cath 2005 patient with 70% PDA  //  b. [place] 5/17 >> LHC oLCx 50, pLAD 50, mLAD 95, RPDA 80, normal LVEF >> s/p CABG  . Carotid artery disease ([place])    a. Carotid US 5/17 bilat ICA 1-39%  . Cataract    replace 11/2012  . Diabetes mellitus    diet controlled  . History of detached retina repair 11-06-2006   [facility]  . History of echocardiogram    a. EF 55% to 60%. Wall motion was normal, Gr 1 DD, Trivial MR/TR  . History of hematuria   . History of prostate cancer 03/2005   s/p prostatectomy  . Hyperlipidemia   . Hypertension     Past Surgical History:  Procedure Laterality Date  . ANGIOPLASTY     1995,2000,2002  . CARDIAC CATHETERIZATION  05.31.2002   Initially the stenosis in the proximal to mid right coronary artery was estimated ar 95%. Following stenting, this improved to 0%. There was residual mild disease in the sostium of the proximal right coronary atrtery. Successful stentng of the proximal to  mid right coronary artery with improvement in stent diameter narrowing from 95% to 0% Proc Date  07/29/2000  . CARDIAC CATHETERIZATION N/A 07/04/2015   Procedure: Left Heart Cath and Coronary Angiography;  Surgeon: Clifford Booze, MD;  Location: [place] [facility];  Service: Cardiovascular;  Laterality: N/A;  . CATARACT EXTRACTION W/ INTRAOCULAR LENS IMPLANT Bilateral OS Sept '14; OD Oct '14   Dr. Bing [name]  . COLONOSCOPY    . CORONARY ARTERY BYPASS GRAFT N/A 07/08/2015   Procedure: CORONARY ARTERY BYPASS GRAFTING (CABG)x three using left internal mammary artery to left anterior decending artery, left greater saphenous vein grafts to diagonal and posterolateral. Left leg greater saphenous leg vein via endoscope. ;  Surgeon: Clifford Isaac, MD;  Location: [place];  Service: Open Heart Surgery;  Laterality: N/A;  . CYSTOSCOPY N/A 07/08/2015   Procedure: CYSTOSCOPY FLEXIBLE WITH BALLOON DILATION OF BLADDER NECK CONTRACTURE WITH DIFFICULT CATHETER PLACEMENT;  Surgeon: Clifford Bring, MD;  Location: [place];  Service: Urology;  Laterality: N/A;  . EYE SURGERY  11/24/2012   left eye cataract  . EYE SURGERY  12/22/2012   right eye cataract  . HEMORRHOID SURGERY     I&D of thrombosed hemorrhoid  . LIPOMA EXCISION    . PROSTATECTOMY  03/2005  . PTCA  2005  . TEE WITHOUT  CARDIOVERSION N/A 07/08/2015   Procedure: TRANSESOPHAGEAL ECHOCARDIOGRAM (TEE);  Surgeon: Clifford Isaac, MD;  Location: [place];  Service: Open Heart Surgery;  Laterality: N/A;  . WRIST GANGLION EXCISION      Current Medications: Outpatient Medications Prior to Visit  Medication Sig Dispense Refill  . Ascorbic Acid (VITAMIN C PO) Take 1 tablet by mouth daily.    Marland Kitchen aspirin EC 81 MG tablet Take 81 mg by mouth once.     . blood glucose meter kit and supplies Dispense based on patient and insurance preference. Use up to four times daily as directed. (FOR ICD-9 250.00, 250.01). 1 each 0  . ezetimibe-simvastatin (VYTORIN) 10-80 MG tablet  Take 1 tablet by mouth daily. 90 tablet 3  . lisinopril (PRINIVIL,ZESTRIL) 10 MG tablet Take 1 tablet (10 mg total) by mouth daily. 90 tablet 3  . metoprolol succinate (TOPROL-XL) 25 MG 24 hr tablet Take 1 tablet (25 mg total) by mouth daily. 90 tablet 3  . Multiple Vitamin (MULTIVITAMIN) tablet Take 1 tablet by mouth daily.      . ONE TOUCH ULTRA TEST test strip 1 each by Other route 2 (two) times daily. Use to check blood sugars twice a day Dx E11.9 100 each 5  . ONETOUCH DELICA LANCETS 14G MISC Use to help check blood sugars twice a day Dx e11.9 100 each 5   No facility-administered medications prior to visit.       Allergies:   Cinnamon   Social History   Social History  . Marital status: Married    Spouse name: N/A  . Number of children: N/A  . Years of education: N/A   Social History Main Topics  . Smoking status: Former Smoker    Quit date: 02/27/1983  . Smokeless tobacco: Never Used  . Alcohol use No  . Drug use: No  . Sexual activity: Yes    Partners: Female   Other Topics Concern  . 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 ACP - discussed with patient and provided packet and referral to [email or web address] Nov '15. He already has stated that if he is :"comatose" pull the plug.      Family History:  The patient's family history includes CAD in his father; Colon polyps in his sister; Diabetes in his sister.   ROS:   Please see the history of present illness.    Review of Systems  Cardiovascular: Positive for chest pain and leg swelling.  Gastrointestinal: Positive for constipation.   All other systems reviewed and are negative.   Physical Exam:    VS:  BP (!) 174/92 Comment: has not had bp meds this morning; takes at 10am  Pulse 74   Ht 5' 4.5" (1.638 m)   Wt 181 lb 8 oz (82.3 kg)   BMI 30.67 kg/m    Physical Exam    Constitutional: He is oriented to person, place, and time. He appears well-developed and well-nourished.  HENT:  Head: Normocephalic and atraumatic.  Eyes:  Scar over lip  Neck: Normal range of motion. Neck supple. No JVD present.  Cardiovascular: Normal rate, regular rhythm and normal heart sounds.  Exam reveals no friction rub.   No murmur heard. Post sternotomy Post LLE venectomy  Pulmonary/Chest: Effort normal and breath sounds normal. He has no wheezes. He has no rales.  Abdominal: Soft. He exhibits  no distension and no mass. There is no tenderness.  Musculoskeletal: Normal range of motion.  Neurological: He is alert and oriented to person, place, and time.  Skin: Skin is warm and dry.  Psychiatric: He has a normal mood and affect.    Wt Readings from Last 3 Encounters:  12/24/16 181 lb 8 oz (82.3 kg)  07/17/16 176 lb (79.8 kg)  04/16/16 180 lb (81.6 kg)      Studies/Labs Reviewed:     EKG:  2015-08-12   NSR, HR 73, leftward axis, poor R-wave progression, QTc 438 ms, no significant changes  12/24/16  SR rate 74 normal   Recent Labs: 07/17/2016: ALT 24; BUN 14; Creatinine, Ser 1.19; Potassium 4.4; Sodium 140   Recent Lipid Panel    Component Value Date/Time   CHOL 104 07/17/2016 0822   TRIG 93.0 07/17/2016 0822   TRIG 133 02/25/2006 0822   HDL 35.20 (L) 07/17/2016 0822   CHOLHDL 3 07/17/2016 0822   VLDL 18.6 07/17/2016 0822   LDLCALC 50 07/17/2016 0822    Additional studies/ records that were reviewed today include:   Intraop TEE 07/08/15 EF 55% to 60%. Wall motion was normal  Carotid US 07/06/15 Bilaterally - 1% to 39% ICA stenosis. Vertebral artery flow is antegrade.  Echo 07/06/15 EF 55% to 60%. Wall motion was normal, Gr 1 DD, Trivial MR/TR  LHC 07/04/15  Ost Cx lesion, 50% stenosed.  Mid LAD lesion, 95% stenosed. Severe, diffuse disease in the mid to distal LAD which is all heavily calcified. The origin of a moderate-sized diagonal is also  compromised.  Prox LAD to Mid LAD lesion, 50% stenosed.  RPDA lesion, 80% stenosed.  The left ventricular systolic function is normal.  Normal LVEDP.   ASSESSMENT:     1. Essential hypertension   2. S/P CABG x 3   3. Bilateral carotid artery disease, unspecified type ([place])   4. Hyperlipidemia, unspecified hyperlipidemia type     PLAN:     In order of problems listed above:  1. CABG:  07/08/15  Continue ASA and beta blocker done with cardiac rehab  2. HTN - Improved on higher dose ACE   3. HL - Continue high-dose statin. Recent LDL 51.  4. CKD - stable f/u primary avoid over diuresis  Lab Results  Component Value Date   CREATININE 1.19 07/17/2016   BUN 14 07/17/2016   NA 140 07/17/2016   K 4.4 07/17/2016   CL 103 07/17/2016   CO2 28 07/17/2016     5. Carotid artery disease - Mild bilat plaque by [name] at CABG.  Consider repeat US in 2 years .  6. DM - FU with PCP for strict control.  He is not on any meds now  Lab Results  Component Value Date   HGBA1C 7.5 (H) 07/17/2016   7. Edema - improved off diuretics now due to CRF and azotemia  08/02/15 duplex no DVT   Clifford [name]

## 2016-12-24 ENCOUNTER — Ambulatory Visit (INDEPENDENT_AMBULATORY_CARE_PROVIDER_SITE_OTHER): Payer: 59 | Admitting: Cardiovascular Disease

## 2016-12-24 ENCOUNTER — Encounter: Payer: Self-pay | Admitting: Cardiovascular Disease

## 2016-12-24 VITALS — BP 174/92 | HR 74 | Ht 64.5 in | Wt 181.5 lb

## 2016-12-24 DIAGNOSIS — I739 Peripheral vascular disease, unspecified: Secondary | ICD-10-CM

## 2016-12-24 DIAGNOSIS — E785 Hyperlipidemia, unspecified: Secondary | ICD-10-CM | POA: Diagnosis not present

## 2016-12-24 DIAGNOSIS — I779 Disorder of arteries and arterioles, unspecified: Secondary | ICD-10-CM

## 2016-12-24 DIAGNOSIS — Z951 Presence of aortocoronary bypass graft: Secondary | ICD-10-CM | POA: Diagnosis not present

## 2016-12-24 DIAGNOSIS — I10 Essential (primary) hypertension: Secondary | ICD-10-CM | POA: Diagnosis not present

## 2016-12-24 NOTE — Patient Instructions (Addendum)

## 2016-12-29 ENCOUNTER — Emergency Department (HOSPITAL_BASED_OUTPATIENT_CLINIC_OR_DEPARTMENT_OTHER)
Admission: EM | Admit: 2016-12-29 | Discharge: 2016-12-29 | Disposition: A | Discharge status: Home / Self Care | Payer: 59 | Attending: Emergency Medicine | Admitting: Emergency Medicine

## 2016-12-29 ENCOUNTER — Emergency Department (HOSPITAL_BASED_OUTPATIENT_CLINIC_OR_DEPARTMENT_OTHER): Payer: 59

## 2016-12-29 ENCOUNTER — Encounter (HOSPITAL_BASED_OUTPATIENT_CLINIC_OR_DEPARTMENT_OTHER): Payer: Self-pay | Admitting: *Deleted

## 2016-12-29 DIAGNOSIS — W1830XA Fall on same level, unspecified, initial encounter: Secondary | ICD-10-CM | POA: Insufficient documentation

## 2016-12-29 DIAGNOSIS — Z8546 Personal history of malignant neoplasm of prostate: Secondary | ICD-10-CM | POA: Insufficient documentation

## 2016-12-29 DIAGNOSIS — S42202A Unspecified fracture of upper end of left humerus, initial encounter for closed fracture: Secondary | ICD-10-CM | POA: Diagnosis not present

## 2016-12-29 DIAGNOSIS — S4982XA Other specified injuries of left shoulder and upper arm, initial encounter: Secondary | ICD-10-CM | POA: Diagnosis present

## 2016-12-29 DIAGNOSIS — Z951 Presence of aortocoronary bypass graft: Secondary | ICD-10-CM | POA: Insufficient documentation

## 2016-12-29 DIAGNOSIS — I129 Hypertensive chronic kidney disease with stage 1 through stage 4 chronic kidney disease, or unspecified chronic kidney disease: Secondary | ICD-10-CM | POA: Diagnosis not present

## 2016-12-29 DIAGNOSIS — Z79899 Other long term (current) drug therapy: Secondary | ICD-10-CM | POA: Insufficient documentation

## 2016-12-29 DIAGNOSIS — I2510 Atherosclerotic heart disease of native coronary artery without angina pectoris: Secondary | ICD-10-CM | POA: Diagnosis not present

## 2016-12-29 DIAGNOSIS — E1122 Type 2 diabetes mellitus with diabetic chronic kidney disease: Secondary | ICD-10-CM | POA: Insufficient documentation

## 2016-12-29 DIAGNOSIS — E119 Type 2 diabetes mellitus without complications: Secondary | ICD-10-CM | POA: Insufficient documentation

## 2016-12-29 DIAGNOSIS — Y999 Unspecified external cause status: Secondary | ICD-10-CM | POA: Diagnosis not present

## 2016-12-29 DIAGNOSIS — Y939 Activity, unspecified: Secondary | ICD-10-CM | POA: Insufficient documentation

## 2016-12-29 DIAGNOSIS — Y929 Unspecified place or not applicable: Secondary | ICD-10-CM | POA: Diagnosis not present

## 2016-12-29 DIAGNOSIS — Z7982 Long term (current) use of aspirin: Secondary | ICD-10-CM | POA: Insufficient documentation

## 2016-12-29 DIAGNOSIS — N183 Chronic kidney disease, stage 3 (moderate): Secondary | ICD-10-CM | POA: Diagnosis not present

## 2016-12-29 MED ORDER — HYDROCODONE-ACETAMINOPHEN 5-325 MG PO TABS: 1.0000 | ORAL_TABLET | Freq: Four times a day (QID) | ORAL | 0 refills | Status: DC | PRN

## 2016-12-29 NOTE — ED Notes (Signed)
Pt discharged to home with family. NAD.  

## 2016-12-29 NOTE — Discharge Instructions (Signed)
It was our pleasure to provide your ER care today - we hope that you feel better.  Ice/coldpack to sore area.   Wear shoulder sling.   Take motrin or aleve as need for pain. You may also take hydrocodone as need for pain. No driving when taking hydrocodone. Also, do not take tylenol or acetaminophen containing medication when taking hydrocodone.  Follow up with orthopedist in the coming week - call office Monday for appt.  Return to ER if worse, new symptoms, new or severe pain, numbness, other concern.

## 2016-12-29 NOTE — ED Triage Notes (Signed)
Pt reports falling onto his left shoulder.  Swelling.  Inability to raise left arm.  CMS intact.

## 2016-12-29 NOTE — ED Provider Notes (Signed)
[facility] EMERGENCY DEPARTMENT Provider Note   CSN: [id number] Arrival date & time: 12/29/16  1622     History   Chief Complaint Chief Complaint  Patient presents with  . Fall    HPI Clifford [name] is a 71 y.o. male.  Patient c/o mechanical fall and injury/pain to left shoulder. Occurred this afternoon. Pain to shoulder is constant, dull, moderate, worse w movement, non radiating. Denies head injury, or loc. No headache or nv. No neck or back pain.  Denies chest pain or sob. No arm numbness/weakness. Right hand dom.     Fall  Pertinent negatives include no chest pain, no abdominal pain, no headaches and no shortness of breath.    Past Medical History:  Diagnosis Date  . CAD (coronary artery disease) 2005   a. Previous stents RCA and circ, Cath 2005 patient with 70% PDA  //  b. [place] 5/17 >> LHC oLCx 50, pLAD 50, mLAD 95, RPDA 80, normal LVEF >> s/p CABG  . Carotid artery disease ([place])    a. Carotid US 5/17 bilat ICA 1-39%  . Cataract    replace 11/2012  . Diabetes mellitus    diet controlled  . History of detached retina repair 11-06-2006   [facility]  . History of echocardiogram    a. EF 55% to 60%. Wall motion was normal, Gr 1 DD, Trivial MR/TR  . History of hematuria   . History of prostate cancer 03/2005   s/p prostatectomy  . Hyperlipidemia   . Hypertension     Patient Active Problem List   Diagnosis Date Noted  . CKD (chronic kidney disease) stage 3, GFR 30-59 ml/min ([facility]) 08/01/2015  . Carotid artery disease ([place])   . 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 Hyperlipidemia 07/03/2015  . Hypertensive heart disease 07/03/2015  . Personal history of colonic polyps-adenomas 11/23/2011  . Routine health maintenance 12/11/2010  . Diabetes mellitus type 2, uncomplicated ([place]) 78/24/2353  . Essential hypertension 03/07/2007  . Coronary atherosclerosis 03/07/2007  . PROSTATE CANCER, HX OF 03/07/2007    Past Surgical History:  Procedure Laterality Date  .  ANGIOPLASTY     1995,2000,2002  . CARDIAC CATHETERIZATION  05.31.2002   Initially the stenosis in the proximal to mid right coronary artery was estimated ar 95%. Following stenting, this improved to 0%. There was residual mild disease in the sostium of the proximal right coronary atrtery. Successful stentng of the proximal to mid right coronary artery with improvement in stent diameter narrowing from 95% to 0% Proc Date  07/29/2000  . CARDIAC CATHETERIZATION N/A 07/04/2015   Procedure: Left Heart Cath and Coronary Angiography;  Surgeon: Jettie Booze, MD;  Location: [place] [facility];  Service: Cardiovascular;  Laterality: N/A;  . CATARACT EXTRACTION W/ INTRAOCULAR LENS IMPLANT Bilateral OS Sept '14; OD Oct '14   Dr. Bing Plume  . COLONOSCOPY    . CORONARY ARTERY BYPASS GRAFT N/A 07/08/2015   Procedure: CORONARY ARTERY BYPASS GRAFTING (CABG)x three using left internal mammary artery to left anterior decending artery, left greater saphenous vein grafts to diagonal and posterolateral. Left leg greater saphenous leg vein via endoscope. ;  Surgeon: [name], MD;  Location: [place];  Service: Open Heart Surgery;  Laterality: N/A;  . CYSTOSCOPY N/A 07/08/2015   Procedure: CYSTOSCOPY FLEXIBLE WITH BALLOON DILATION OF BLADDER NECK CONTRACTURE WITH DIFFICULT CATHETER PLACEMENT;  Surgeon: Raynelle Bring, MD;  Location: [place];  Service: Urology;  Laterality: N/A;  . EYE SURGERY  11/24/2012   left eye cataract  . EYE SURGERY  12/22/2012   right eye cataract  . HEMORRHOID SURGERY     I&D of thrombosed hemorrhoid  . LIPOMA EXCISION    . PROSTATECTOMY  03/2005  . PTCA  2005  . TEE WITHOUT CARDIOVERSION N/A 07/08/2015   Procedure: TRANSESOPHAGEAL ECHOCARDIOGRAM (TEE);  Surgeon: [name], MD;  Location: [place];  Service: Open Heart Surgery;  Laterality: N/A;  . WRIST GANGLION EXCISION         Home Medications    Prior to Admission medications   Medication Sig Start Date End Date Taking?  Authorizing Provider  Ascorbic Acid (VITAMIN C PO) Take 1 tablet by mouth daily.    [provider]  aspirin EC 81 MG tablet Take 81 mg by mouth once.     [provider]  blood glucose meter kit and supplies Dispense based on patient and insurance preference. Use up to four times daily as directed. (FOR ICD-9 250.00, 250.01). 07/14/15   [name], [name] R, PA-C  ezetimibe-simvastatin (VYTORIN) 10-80 MG tablet Take 1 tablet by mouth daily. 01/18/16   Hoyt Koch, MD  lisinopril (PRINIVIL,ZESTRIL) 10 MG tablet Take 1 tablet (10 mg total) by mouth daily. 04/16/16   Josue Hector, MD  metoprolol succinate (TOPROL-XL) 25 MG 24 hr tablet Take 1 tablet (25 mg total) by mouth daily. 01/18/16   Hoyt Koch, MD  Multiple Vitamin (MULTIVITAMIN) tablet Take 1 tablet by mouth daily.      [provider]  ONE TOUCH ULTRA TEST test strip 1 each by Other route 2 (two) times daily. Use to check blood sugars twice a day Dx E11.9 09/12/15   Hoyt Koch, MD  [facility] DELICA LANCETS 82N MISC Use to help check blood sugars twice a day Dx e11.9 09/12/15   Hoyt Koch, MD    Family History Family History  Problem Relation Age of Onset  . CAD Father   . Diabetes Sister   . Colon polyps Sister   . Cancer Neg Hx        Colon  . Colon cancer Neg Hx   . Esophageal cancer Neg Hx   . Rectal cancer Neg Hx   . Stomach cancer Neg Hx     Social History Social History  Substance Use Topics  . Smoking status: Former Smoker    Quit date: 02/27/1983  . Smokeless tobacco: Never Used  . Alcohol use No     Allergies   Cinnamon   Review of Systems Review of Systems  Constitutional: Negative for fever.  HENT: Negative for nosebleeds.   Eyes: Negative for redness.  Respiratory: Negative for shortness of breath.   Cardiovascular: Negative for chest pain.  Gastrointestinal: Negative for abdominal pain.  Genitourinary: Negative for flank pain.    Musculoskeletal: Negative for back pain and neck pain.  Skin: Negative for wound.  Neurological: Negative for weakness, numbness and headaches.  Hematological: Does not bruise/bleed easily.  Psychiatric/Behavioral: Negative for confusion.     Physical Exam Updated Vital Signs BP (!) 161/100 (BP Location: Left Arm)   Pulse 95   Temp 98.2 F (36.8 C) (Oral)   Resp 19   Ht 1.651 m ('5\' 5"' )   Wt 81.6 kg (180 lb)   SpO2 100%   BMI 29.95 kg/m   Physical Exam  Constitutional: He is oriented to person, place, and time. He appears well-developed and well-nourished. No distress.  HENT:  Head: Atraumatic.  Eyes: Conjunctivae are normal.  Neck: Neck supple. No tracheal deviation present.  Cardiovascular: Intact distal pulses.   Pulmonary/Chest: Effort normal. No accessory muscle usage. No respiratory distress.  Abdominal: He exhibits no distension. There is no tenderness.  Musculoskeletal:  Tenderness left shoulder/prox humerus. Radial pulse 2+. No other focal bony tenderness noted on bil extremity exam. CTLS spine, non tender, aligned, no step off.   Neurological: He is alert and oriented to person, place, and time.  LUE rad/med/uln n fxn intact, motor and sens.  Steady gait.   Skin: Skin is warm and dry. He is not diaphoretic.  Psychiatric: He has a normal mood and affect.  Nursing note and vitals reviewed.    ED Treatments / Results  Labs (all labs ordered are listed, but only abnormal results are displayed) Labs Reviewed - No data to display  EKG  EKG Interpretation None       Radiology Dg Shoulder Left  Result Date: 12/29/2016 CLINICAL DATA:  Pt fell off 2 foot wall and landed on shoulder; pain in [facility] and humeral head; unable to move are away from body; pain lifting arm; no prior injury to shoulder before today EXAM: LEFT SHOULDER - 2+ VIEW COMPARISON:  None. FINDINGS: There fractures of proximal humerus appear fractures are nondisplaced. There is a transverse  fracture across the metaphysis. There is a fracture across the base of the greater tuberosity. No other fractures. The glenohumeral and AC joints are normally spaced and aligned. Bones are demineralized. IMPRESSION: Nondisplaced fractures of the proximal left humerus as described. No dislocation Electronically Signed   By: Lajean Manes M.D.   On: 12/29/2016 17:03    Procedures Procedures (including critical care time)  Medications Ordered in ED Medications - No data to display   Initial Impression / Assessment and Plan / ED Course  I have reviewed the triage vital signs and the nursing notes.  Pertinent labs & imaging results that were available during my care of the patient were reviewed by me and considered in my medical decision making (see chart for details).  Pt declines any pain med in ed, including acetaminophen, ibuprofen.   Left shoulder sling/immobilizer. Icepack.  Discussed xrays w pt and ortho f/u.      Final Clinical Impressions(s) / ED Diagnoses   Final diagnoses:  None    New Prescriptions New Prescriptions   No medications on file     Lajean Saver, MD 12/29/16 1712

## 2016-12-29 NOTE — ED Notes (Signed)
Patient transported to X-ray 

## 2017-01-14 ENCOUNTER — Other Ambulatory Visit: Payer: Self-pay | Admitting: Internal Medicine

## 2017-01-21 ENCOUNTER — Other Ambulatory Visit (INDEPENDENT_AMBULATORY_CARE_PROVIDER_SITE_OTHER): Payer: 59

## 2017-01-21 ENCOUNTER — Ambulatory Visit (INDEPENDENT_AMBULATORY_CARE_PROVIDER_SITE_OTHER): Payer: 59 | Admitting: Internal Medicine

## 2017-01-21 ENCOUNTER — Encounter: Payer: Self-pay | Admitting: Internal Medicine

## 2017-01-21 VITALS — BP 158/92 | HR 87 | Temp 98.5°F | Ht 62.0 in | Wt 177.8 lb

## 2017-01-21 DIAGNOSIS — Z23 Encounter for immunization: Secondary | ICD-10-CM | POA: Diagnosis not present

## 2017-01-21 DIAGNOSIS — N183 Chronic kidney disease, stage 3 unspecified: Secondary | ICD-10-CM

## 2017-01-21 DIAGNOSIS — Z0000 Encounter for general adult medical examination without abnormal findings: Secondary | ICD-10-CM

## 2017-01-21 DIAGNOSIS — E119 Type 2 diabetes mellitus without complications: Secondary | ICD-10-CM

## 2017-01-21 LAB — CBC
HEMATOCRIT: 48.4 % (ref 39.0–52.0)
Hemoglobin: 16.2 g/dL (ref 13.0–17.0)
MCHC: 33.4 g/dL (ref 30.0–36.0)
MCV: 94 fl (ref 78.0–100.0)
Platelets: 387 10*3/uL (ref 150.0–400.0)
RBC: 5.15 Mil/uL (ref 4.22–5.81)
RDW: 12.9 % (ref 11.5–15.5)
WBC: 8.1 10*3/uL (ref 4.0–10.5)

## 2017-01-21 LAB — COMPREHENSIVE METABOLIC PANEL
ALK PHOS: 71 U/L (ref 39–117)
ALT: 15 U/L (ref 0–53)
AST: 17 U/L (ref 0–37)
Albumin: 4.5 g/dL (ref 3.5–5.2)
BUN: 19 mg/dL (ref 6–23)
CO2: 30 mEq/L (ref 19–32)
CREATININE: 1.32 mg/dL (ref 0.40–1.50)
Calcium: 10 mg/dL (ref 8.4–10.5)
Chloride: 101 mEq/L (ref 96–112)
GFR: 56.41 mL/min — ABNORMAL LOW (ref >60.00)
GLUCOSE: 148 mg/dL — AB (ref 70–99)
POTASSIUM: 4.4 meq/L (ref 3.5–5.1)
SODIUM: 140 meq/L (ref 135–145)
Total Bilirubin: 1 mg/dL (ref 0.2–1.2)
Total Protein: 7.8 g/dL (ref 6.0–8.3)

## 2017-01-21 LAB — HEMOGLOBIN A1C: Hgb A1c MFr Bld: 7.1 % — ABNORMAL HIGH (ref 4.6–6.5)

## 2017-01-21 NOTE — Assessment & Plan Note (Signed)
BP at goal (high today without meds) and diabetes well controlled. Checking cmp and adjust as needed.

## 2017-01-21 NOTE — Patient Instructions (Signed)
We have given you the flu shot today and are checking the labs.   Health Maintenance, Male A healthy lifestyle and preventive care is important for your health and wellness. Ask your health care provider about what schedule of regular examinations is right for you. What should I know about weight and diet? Eat a Healthy Diet  Eat plenty of vegetables, fruits, whole grains, low-fat dairy products, and lean protein.  Do not eat a lot of foods high in solid fats, added sugars, or salt.  Maintain a Healthy Weight Regular exercise can help you achieve or maintain a healthy weight. You should:  Do at least 150 minutes of exercise each week. The exercise should increase your heart rate and make you sweat (moderate-intensity exercise).  Do strength-training exercises at least twice a week.  Watch Your Levels of Cholesterol and Blood Lipids  Have your blood tested for lipids and cholesterol every 5 years starting at 73 years of age. If you are at high risk for heart disease, you should start having your blood tested when you are 73 years old. You may need to have your cholesterol levels checked more often if: ? Your lipid or cholesterol levels are high. ? You are older than 73 years of age. ? You are at high risk for heart disease.  What should I know about cancer screening? Many types of cancers can be detected early and may often be prevented. Lung Cancer  You should be screened every year for lung cancer if: ? You are a current smoker who has smoked for at least 30 years. ? You are a former smoker who has quit within the past 15 years.  Talk to your health care provider about your screening options, when you should start screening, and how often you should be screened.  Colorectal Cancer  Routine colorectal cancer screening usually begins at 73 years of age and should be repeated every 5-10 years until you are 73 years old. You may need to be screened more often if early forms of  precancerous polyps or small growths are found. Your health care provider may recommend screening at an earlier age if you have risk factors for colon cancer.  Your health care provider may recommend using home test kits to check for hidden blood in the stool.  A small camera at the end of a tube can be used to examine your colon (sigmoidoscopy or colonoscopy). This checks for the earliest forms of colorectal cancer.  Prostate and Testicular Cancer  Depending on your age and overall health, your health care provider may do certain tests to screen for prostate and testicular cancer.  Talk to your health care provider about any symptoms or concerns you have about testicular or prostate cancer.  Skin Cancer  Check your skin from head to toe regularly.  Tell your health care provider about any new moles or changes in moles, especially if: ? There is a change in a mole's size, shape, or color. ? You have a mole that is larger than a pencil eraser.  Always use sunscreen. Apply sunscreen liberally and repeat throughout the day.  Protect yourself by wearing long sleeves, pants, a wide-brimmed hat, and sunglasses when outside.  What should I know about heart disease, diabetes, and high blood pressure?  If you are 10-63 years of age, have your blood pressure checked every 3-5 years. If you are 69 years of age or older, have your blood pressure checked every year. You should have your  blood pressure measured twice-once when you are at a hospital or clinic, and once when you are not at a hospital or clinic. Record the average of the two measurements. To check your blood pressure when you are not at a hospital or clinic, you can use: ? An automated blood pressure machine at a pharmacy. ? A home blood pressure monitor.  Talk to your health care provider about your target blood pressure.  If you are between 38-31 years old, ask your health care provider if you should take aspirin to prevent heart  disease.  Have regular diabetes screenings by checking your fasting blood sugar level. ? If you are at a normal weight and have a low risk for diabetes, have this test once every three years after the age of 45. ? If you are overweight and have a high risk for diabetes, consider being tested at a younger age or more often.  A one-time screening for abdominal aortic aneurysm (AAA) by ultrasound is recommended for men aged 47-75 years who are current or former smokers. What should I know about preventing infection? Hepatitis B If you have a higher risk for hepatitis B, you should be screened for this virus. Talk with your health care provider to find out if you are at risk for hepatitis B infection. Hepatitis C Blood testing is recommended for:  Everyone born from [birth year] through 1967.  Anyone with known risk factors for hepatitis C.  Sexually Transmitted Diseases (STDs)  You should be screened each year for STDs including gonorrhea and chlamydia if: ? You are sexually active and are younger than 73 years of age. ? You are older than 73 years of age and your health care provider tells you that you are at risk for this type of infection. ? Your sexual activity has changed since you were last screened and you are at an increased risk for chlamydia or gonorrhea. Ask your health care provider if you are at risk.  Talk with your health care provider about whether you are at high risk of being infected with HIV. Your health care provider may recommend a prescription medicine to help prevent HIV infection.  What else can I do?  Schedule regular health, dental, and eye exams.  Stay current with your vaccines (immunizations).  Do not use any tobacco products, such as cigarettes, chewing tobacco, and e-cigarettes. If you need help quitting, ask your health care provider.  Limit alcohol intake to no more than 2 drinks per day. One drink equals 12 ounces of beer, 5 ounces of wine, or 1 ounces of  hard liquor.  Do not use street drugs.  Do not share needles.  Ask your health care provider for help if you need support or information about quitting drugs.  Tell your health care provider if you often feel depressed.  Tell your health care provider if you have ever been abused or do not feel safe at home. This information is not intended to replace advice given to you by your health care provider. Make sure you discuss any questions you have with your health care provider. Document Released: 08/11/2007 Document Revised: 10/12/2015 Document Reviewed: 11/16/2014 Elsevier Interactive Patient Education  [employer].

## 2017-01-21 NOTE — Assessment & Plan Note (Addendum)
Foot exam done today, on ACE-I and statin. Diet controlled and not complicated. Eye exam up to date with [facility]. Checking HgA1c and adjust as needed.

## 2017-01-21 NOTE — Assessment & Plan Note (Signed)
Flu shot given at visit, counseled about shingrix. Pneumonia and tetanus up to date. Colonoscopy up to date. Counseled about sun safety and mole surveillance. Counseled about fall reduction. Given screening recommendations.

## 2017-01-21 NOTE — Progress Notes (Signed)
   Subjective:    Patient ID: Clifford [name], male    DOB: December 25, 1945, 71 y.o.   MRN: [id number]  HPI The patient is a 71 YO man coming in for physical.   PMH, [facility], social history reviewed and updated.  Review of Systems  Constitutional: Negative.   HENT: Negative.   Eyes: Negative.   Respiratory: Negative for cough, chest tightness and shortness of breath.   Cardiovascular: Negative for chest pain, palpitations and leg swelling.  Gastrointestinal: Negative for abdominal distention, abdominal pain, constipation, diarrhea, nausea and vomiting.  Musculoskeletal: Positive for arthralgias.  Skin: Negative.   Neurological: Negative.   Psychiatric/Behavioral: Negative.       Objective:   Physical Exam  Constitutional: He is oriented to person, place, and time. He appears well-developed and well-nourished.  HENT:  Head: Normocephalic and atraumatic.  Eyes: EOM are normal.  Neck: Normal range of motion.  Cardiovascular: Normal rate and regular rhythm.  Pulmonary/Chest: Effort normal and breath sounds normal. No respiratory distress. He has no wheezes. He has no rales.  Abdominal: Soft. Bowel sounds are normal. He exhibits no distension. There is no tenderness. There is no rebound.  Musculoskeletal: He exhibits no edema.  Neurological: He is alert and oriented to person, place, and time. Coordination normal.  Skin: Skin is warm and dry.  Psychiatric: He has a normal mood and affect.   Vitals:   01/21/17 0804  BP: (!) 158/92  Pulse: 87  Temp: 98.5 F (36.9 C)  TempSrc: Oral  SpO2: 98%  Weight: 177 lb 12 oz (80.6 kg)  Height: 5\' 2"  (1.575 m)      Assessment & Plan:  Flu shot given at visit

## 2017-07-23 ENCOUNTER — Ambulatory Visit (INDEPENDENT_AMBULATORY_CARE_PROVIDER_SITE_OTHER): Payer: Medicare Other | Admitting: Internal Medicine

## 2017-07-23 ENCOUNTER — Encounter: Payer: Self-pay | Admitting: Internal Medicine

## 2017-07-23 ENCOUNTER — Other Ambulatory Visit (INDEPENDENT_AMBULATORY_CARE_PROVIDER_SITE_OTHER): Payer: Medicare Other

## 2017-07-23 VITALS — BP 150/88 | HR 89 | Temp 97.8°F | Ht 65.0 in | Wt 173.0 lb

## 2017-07-23 DIAGNOSIS — E119 Type 2 diabetes mellitus without complications: Secondary | ICD-10-CM

## 2017-07-23 DIAGNOSIS — I10 Essential (primary) hypertension: Secondary | ICD-10-CM | POA: Diagnosis not present

## 2017-07-23 DIAGNOSIS — E785 Hyperlipidemia, unspecified: Secondary | ICD-10-CM | POA: Diagnosis not present

## 2017-07-23 LAB — LIPID PANEL
CHOL/HDL RATIO: 7
Cholesterol: 251 mg/dL — ABNORMAL HIGH (ref 0–200)
HDL: 36.5 mg/dL — ABNORMAL LOW (ref >39.00)
NonHDL: 214.08
Triglycerides: 234 mg/dL — ABNORMAL HIGH (ref 0.0–149.0)
VLDL: 46.8 mg/dL — AB (ref 0.0–40.0)

## 2017-07-23 LAB — COMPREHENSIVE METABOLIC PANEL
ALK PHOS: 47 U/L (ref 39–117)
ALT: 36 U/L (ref 0–53)
AST: 23 U/L (ref 0–37)
Albumin: 4.4 g/dL (ref 3.5–5.2)
BUN: 18 mg/dL (ref 6–23)
CHLORIDE: 103 meq/L (ref 96–112)
CO2: 28 mEq/L (ref 19–32)
Calcium: 9.8 mg/dL (ref 8.4–10.5)
Creatinine, Ser: 1.21 mg/dL (ref 0.40–1.50)
GFR: 62.29 mL/min (ref >60.00)
GLUCOSE: 154 mg/dL — AB (ref 70–99)
POTASSIUM: 4.2 meq/L (ref 3.5–5.1)
SODIUM: 139 meq/L (ref 135–145)
TOTAL PROTEIN: 7.5 g/dL (ref 6.0–8.3)
Total Bilirubin: 1 mg/dL (ref 0.2–1.2)

## 2017-07-23 LAB — HEMOGLOBIN A1C: HEMOGLOBIN A1C: 7.3 % — AB (ref 4.6–6.5)

## 2017-07-23 LAB — LDL CHOLESTEROL, DIRECT: LDL DIRECT: 181 mg/dL

## 2017-07-23 NOTE — Assessment & Plan Note (Signed)
Taking vytorin. Checking lipid panel and adjust as needed.

## 2017-07-23 NOTE — Assessment & Plan Note (Signed)
Taking lisinopril and metoprolol and BP at goal. Checking CMP and adjust as needed.

## 2017-07-23 NOTE — Assessment & Plan Note (Signed)
Checking lipid panel and HgA1c, diet controlled. On statin and ACE-I. Not complicated.

## 2017-07-23 NOTE — Progress Notes (Signed)
   Subjective:    Patient ID: Clifford [name], male    DOB: [birth year], 72 y.o.   MRN: [id number]  HPI The patient is a 72 YO male coming in for follow up of his diabetes (controlled by diet, exercising the same, denies diet changes), and his blood pressure (taking lisinopril and metoprolol, most readings at home around 150/80s, denies headaches or chest pains) and cholesterol (taking vytorin, denies muscle cramps, chest pains). Denies new concerns.   Review of Systems  Constitutional: Negative.   HENT: Negative.   Eyes: Negative.   Respiratory: Negative for cough, chest tightness and shortness of breath.   Cardiovascular: Negative for chest pain, palpitations and leg swelling.  Gastrointestinal: Negative for abdominal distention, abdominal pain, constipation, diarrhea, nausea and vomiting.  Musculoskeletal: Negative.   Skin: Negative.   Neurological: Negative.   Psychiatric/Behavioral: Negative.       Objective:   Physical Exam  Constitutional: He is oriented to person, place, and time. He appears well-developed and well-nourished.  HENT:  Head: Normocephalic and atraumatic.  Eyes: EOM are normal.  Neck: Normal range of motion.  Cardiovascular: Normal rate and regular rhythm.  Pulmonary/Chest: Effort normal and breath sounds normal. No respiratory distress. He has no wheezes. He has no rales.  Abdominal: Soft. Bowel sounds are normal. He exhibits no distension. There is no tenderness. There is no rebound.  Musculoskeletal: He exhibits no edema.  Neurological: He is alert and oriented to person, place, and time. Coordination normal.  Skin: Skin is warm and dry.  Psychiatric: He has a normal mood and affect.   Vitals:   07/23/17 0755  BP: (!) 150/88  Pulse: 89  Temp: 97.8 F (36.6 C)  TempSrc: Oral  SpO2: 96%  Weight: 173 lb (78.5 kg)  Height: 5\' 2"  (1.575 m)      Assessment & Plan:

## 2017-07-23 NOTE — Patient Instructions (Signed)
We are checking the labs today.   Keep up the good work with the health.

## 2017-09-16 ENCOUNTER — Other Ambulatory Visit: Payer: Self-pay | Admitting: Cardiovascular Disease

## 2017-10-14 LAB — HM DIABETES EYE EXAM

## 2017-12-23 NOTE — Progress Notes (Signed)
Cardiology Office Note:    Date:  12/26/2017   ID:  Clifford [name], DOB [birth year], MRN [id number]  PCP:  Hoyt Koch, MD  Cardiologist:  Dr. Jenkins Rouge   Electrophysiologist:  n/a  Referring MD: Hoyt Koch, *   Post CABG F/U  History of Present Illness:     Clifford [name] is a 72 y.o. male with a hx of CAD status post prior PCI in 1995, 2000 and 2002, diabetes, HTN, HL, prostate CA status post prostatectomy.    Admitted 5/6-5/18/17  with unstable angina. Cardiac enzymes remained negative. Cardiac catheterization demonstrated 3 vessel CAD and CABG was recommended. He was seen by Dr. Servando Snare and underwent CABG with LIMA-LAD, SVG-PLVB, SVG-DX. Postoperative course was notable for volume excess as well as worsening creatinine.   No angina compliant with meds   Will get flu shot at primary office next week    Past Medical History:  Diagnosis Date  . CAD (coronary artery disease) 2005   a. Previous stents RCA and circ, Cath 2005 patient with 70% PDA  //  b. [place] 5/17 >> LHC oLCx 50, pLAD 50, mLAD 95, RPDA 80, normal LVEF >> s/p CABG  . Carotid artery disease ([place])    a. Carotid US 5/17 bilat ICA 1-39%  . Cataract    replace 11/2012  . Diabetes mellitus    diet controlled  . History of detached retina repair 11-06-2006   [facility]  . History of echocardiogram    a. EF 55% to 60%. Wall motion was normal, Gr 1 DD, Trivial MR/TR  . History of hematuria   . History of prostate cancer 03/2005   s/p prostatectomy  . Hyperlipidemia   . Hypertension     Past Surgical History:  Procedure Laterality Date  . ANGIOPLASTY     1995,2000,2002  . CARDIAC CATHETERIZATION  05.31.2002   Initially the stenosis in the proximal to mid right coronary artery was estimated ar 95%. Following stenting, this improved to 0%. There was residual mild disease in the sostium of the proximal right coronary atrtery. Successful stentng of the proximal to mid right coronary artery  with improvement in stent diameter narrowing from 95% to 0% Proc Date  07/29/2000  . CARDIAC CATHETERIZATION N/A 07/04/2015   Procedure: Left Heart Cath and Coronary Angiography;  Surgeon: Jettie Booze, MD;  Location: [place] [facility];  Service: Cardiovascular;  Laterality: N/A;  . CATARACT EXTRACTION W/ INTRAOCULAR LENS IMPLANT Bilateral OS Sept '14; OD Oct '14   Dr. Bing Plume  . COLONOSCOPY    . CORONARY ARTERY BYPASS GRAFT N/A 07/08/2015   Procedure: CORONARY ARTERY BYPASS GRAFTING (CABG)x three using left internal mammary artery to left anterior decending artery, left greater saphenous vein grafts to diagonal and posterolateral. Left leg greater saphenous leg vein via endoscope. ;  Surgeon: [name], MD;  Location: [place];  Service: Open Heart Surgery;  Laterality: N/A;  . CYSTOSCOPY N/A 07/08/2015   Procedure: CYSTOSCOPY FLEXIBLE WITH BALLOON DILATION OF BLADDER NECK CONTRACTURE WITH DIFFICULT CATHETER PLACEMENT;  Surgeon: Raynelle Bring, MD;  Location: [place];  Service: Urology;  Laterality: N/A;  . EYE SURGERY  11/24/2012   left eye cataract  . EYE SURGERY  12/22/2012   right eye cataract  . HEMORRHOID SURGERY     I&D of thrombosed hemorrhoid  . LIPOMA EXCISION    . PROSTATECTOMY  03/2005  . PTCA  2005  . TEE WITHOUT CARDIOVERSION N/A 07/08/2015   Procedure:  TRANSESOPHAGEAL ECHOCARDIOGRAM (TEE);  Surgeon: [name], MD;  Location: [place];  Service: Open Heart Surgery;  Laterality: N/A;  . WRIST GANGLION EXCISION      Current Medications: Outpatient Medications Prior to Visit  Medication Sig Dispense Refill  . Ascorbic Acid (VITAMIN C PO) Take 1 tablet by mouth daily.    Marland Kitchen aspirin EC 81 MG tablet Take 81 mg by mouth once.     . blood glucose meter kit and supplies Dispense based on patient and insurance preference. Use up to four times daily as directed. (FOR ICD-9 250.00, 250.01). 1 each 0  . ezetimibe-simvastatin (VYTORIN) 10-80 MG tablet TAKE 1 TABLET BY MOUTH  DAILY  90 tablet 1  . lisinopril (PRINIVIL,ZESTRIL) 10 MG tablet Take 1 tablet (10 mg total) by mouth daily. Please make appt with Dr. Johnsie Cancel for October for future refills. 1st attempt 90 tablet 0  . metoprolol succinate (TOPROL-XL) 25 MG 24 hr tablet Take 1 tablet (25 mg total) by mouth daily. 90 tablet 3  . Multiple Vitamin (MULTIVITAMIN) tablet Take 1 tablet by mouth daily.      . ONE TOUCH ULTRA TEST test strip 1 each by Other route 2 (two) times daily. Use to check blood sugars twice a day Dx E11.9 100 each 5  . ONETOUCH DELICA LANCETS 58N MISC Use to help check blood sugars twice a day Dx e11.9 100 each 5   No facility-administered medications prior to visit.       Allergies:   Cinnamon   Social History   Socioeconomic History  . Marital status: Married    Spouse name: Not on file  . Number of children: Not on file  . Years of education: Not on file  . Highest education level: Not on file  Occupational History  . Not on file  Social Needs  . Financial resource strain: Not on file  . Food insecurity:    Worry: Not on file    Inability: Not on file  . Transportation needs:    Medical: Not on file    Non-medical: Not on file  Tobacco Use  . Smoking status: Former Smoker    Last attempt to quit: 02/27/1983    Years since quitting: 34.8  . Smokeless tobacco: Never Used  Substance and Sexual Activity  . Alcohol use: No    Alcohol/week: 0.0 standard drinks  . Drug use: No  . Sexual activity: Yes    Partners: Female  Lifestyle  . Physical activity:    Days per week: Not on file    Minutes per session: Not on file  . Stress: Not on file  Relationships  . Social connections:    Talks on phone: Not on file    Gets together: Not on file    Attends religious service: Not on file    Active member of club or organization: Not on file    Attends meetings of clubs or organizations: Not on file    Relationship status: Not on file  Other Topics Concern  . 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 ACP - discussed with patient and provided packet and referral to [email or web address] Nov '15. He already has stated that if he is :"comatose" pull the plug.      Family History:  The patient's family history includes CAD in his father;  Colon polyps in his sister; Diabetes in his sister.   ROS:   Please see the history of present illness.    Review of Systems  Cardiovascular: Positive for chest pain and leg swelling.  Gastrointestinal: Positive for constipation.   All other systems reviewed and are negative.   Physical Exam:    VS:  BP 128/72   Pulse 83   Ht 5' 5" (1.651 m)   Wt 173 lb 8 oz (78.7 kg)   SpO2 97%   BMI 28.87 kg/m    Affect appropriate Healthy:  appears stated age 53: Scar over lip  Neck supple with no adenopathy JVP normal no bruits no thyromegaly Lungs clear with no wheezing and good diaphragmatic motion Heart:  S1/S2 no murmur, no rub, gallop or click PMI normal Abdomen: benighn, BS positve, no tenderness, no AAA no bruit.  No HSM or HJR Distal pulses intact with no bruits Trace LE bilateral  edema Neuro non-focal Skin warm and dry No muscular weakness   Wt Readings from Last 3 Encounters:  12/26/17 173 lb 8 oz (78.7 kg)  07/23/17 173 lb (78.5 kg)  01/21/17 177 lb 12 oz (80.6 kg)      Studies/Labs Reviewed:     EKG:  2015/08/20   NSR, HR 73, leftward axis, poor R-wave progression, QTc 438 ms, no significant changes  12/24/16  SR rate 74 normal   Recent Labs: 01/21/2017: Hemoglobin 16.2; Platelets 387.0 07/23/2017: ALT 36; BUN 18; Creatinine, Ser 1.21; Potassium 4.2; Sodium 139   Recent Lipid Panel    Component Value Date/Time   CHOL 251 (H) 07/23/2017 0832   TRIG 234.0 (H) 07/23/2017 0832   TRIG 133 02/25/2006 0822   HDL 36.50 (L) 07/23/2017 0832   CHOLHDL 7 07/23/2017 0832   VLDL  46.8 (H) 07/23/2017 0832   LDLCALC 50 07/17/2016 0822   LDLDIRECT 181.0 07/23/2017 0832    Additional studies/ records that were reviewed today include:   Intraop TEE 07/08/15 EF 55% to 60%. Wall motion was normal  Carotid US 07/06/15 Bilaterally - 1% to 39% ICA stenosis. Vertebral artery flow is antegrade.  Echo 07/06/15 EF 55% to 60%. Wall motion was normal, Gr 1 DD, Trivial MR/TR  LHC 07/04/15  Ost Cx lesion, 50% stenosed.  Mid LAD lesion, 95% stenosed. Severe, diffuse disease in the mid to distal LAD which is all heavily calcified. The origin of a moderate-sized diagonal is also compromised.  Prox LAD to Mid LAD lesion, 50% stenosed.  RPDA lesion, 80% stenosed.  The left ventricular systolic function is normal.  Normal LVEDP.   ASSESSMENT:     1. S/P CABG x 3   2. Hyperlipidemia, unspecified hyperlipidemia type   3. Essential hypertension   4. Bilateral carotid artery stenosis     PLAN:     In order of problems listed above:  1. CABG:  07/08/15  Continue ASA and beta blocker no angina , active stable   2. HTN - Improved on higher dose ACE   3. HL - Continue high-dose statin. Recent LDL 51.  4. CKD - stable f/u primary avoid over diuresis  Baseline Cr around 1.2   5. Carotid artery disease - Mild bilat plaque by [name] at CABG.  Consider repeat US in 2 years .  6. DM - FU with PCP for strict control.  He is not on any meds now  Lab Results  Component Value Date   HGBA1C 7.3 (H) 07/23/2017   7. Edema - improved  off diuretics now due to CRF and azotemia  08/02/15 duplex no DVT   Jenkins Rouge

## 2017-12-26 ENCOUNTER — Ambulatory Visit (INDEPENDENT_AMBULATORY_CARE_PROVIDER_SITE_OTHER): Payer: Medicare Other | Admitting: Cardiovascular Disease

## 2017-12-26 ENCOUNTER — Encounter: Payer: Self-pay | Admitting: Cardiovascular Disease

## 2017-12-26 VITALS — BP 128/72 | HR 83 | Ht 65.0 in | Wt 173.5 lb

## 2017-12-26 DIAGNOSIS — E785 Hyperlipidemia, unspecified: Secondary | ICD-10-CM | POA: Diagnosis not present

## 2017-12-26 DIAGNOSIS — I10 Essential (primary) hypertension: Secondary | ICD-10-CM

## 2017-12-26 DIAGNOSIS — Z951 Presence of aortocoronary bypass graft: Secondary | ICD-10-CM | POA: Diagnosis not present

## 2017-12-26 DIAGNOSIS — I6523 Occlusion and stenosis of bilateral carotid arteries: Secondary | ICD-10-CM

## 2017-12-26 NOTE — Patient Instructions (Signed)

## 2017-12-30 ENCOUNTER — Other Ambulatory Visit: Payer: Self-pay | Admitting: Cardiovascular Disease

## 2018-01-22 ENCOUNTER — Ambulatory Visit (INDEPENDENT_AMBULATORY_CARE_PROVIDER_SITE_OTHER): Payer: Medicare Other | Admitting: Internal Medicine

## 2018-01-22 ENCOUNTER — Other Ambulatory Visit (INDEPENDENT_AMBULATORY_CARE_PROVIDER_SITE_OTHER): Payer: Medicare Other

## 2018-01-22 ENCOUNTER — Encounter: Payer: Self-pay | Admitting: Internal Medicine

## 2018-01-22 VITALS — BP 122/80 | HR 79 | Temp 97.7°F | Ht 65.0 in | Wt 175.0 lb

## 2018-01-22 DIAGNOSIS — I10 Essential (primary) hypertension: Secondary | ICD-10-CM

## 2018-01-22 DIAGNOSIS — N183 Chronic kidney disease, stage 3 unspecified: Secondary | ICD-10-CM

## 2018-01-22 DIAGNOSIS — E785 Hyperlipidemia, unspecified: Secondary | ICD-10-CM

## 2018-01-22 DIAGNOSIS — E119 Type 2 diabetes mellitus without complications: Secondary | ICD-10-CM

## 2018-01-22 DIAGNOSIS — Z0000 Encounter for general adult medical examination without abnormal findings: Secondary | ICD-10-CM

## 2018-01-22 DIAGNOSIS — Z23 Encounter for immunization: Secondary | ICD-10-CM

## 2018-01-22 DIAGNOSIS — E1169 Type 2 diabetes mellitus with other specified complication: Secondary | ICD-10-CM

## 2018-01-22 LAB — CBC
HCT: 50.1 % (ref 39.0–52.0)
Hemoglobin: 17.5 g/dL — ABNORMAL HIGH (ref 13.0–17.0)
MCHC: 34.9 g/dL (ref 30.0–36.0)
MCV: 95.1 fl (ref 78.0–100.0)
PLATELETS: 259 10*3/uL (ref 150.0–400.0)
RBC: 5.27 Mil/uL (ref 4.22–5.81)
RDW: 12.6 % (ref 11.5–15.5)
WBC: 8.2 10*3/uL (ref 4.0–10.5)

## 2018-01-22 LAB — LIPID PANEL
CHOL/HDL RATIO: 3
Cholesterol: 119 mg/dL (ref 0–200)
HDL: 40.1 mg/dL (ref >39.00)
LDL CALC: 53 mg/dL (ref 0–99)
NonHDL: 79.14
TRIGLYCERIDES: 130 mg/dL (ref 0.0–149.0)
VLDL: 26 mg/dL (ref 0.0–40.0)

## 2018-01-22 LAB — HEMOGLOBIN A1C: Hgb A1c MFr Bld: 7.2 % — ABNORMAL HIGH (ref 4.6–6.5)

## 2018-01-22 LAB — COMPREHENSIVE METABOLIC PANEL
ALT: 19 U/L (ref 0–53)
AST: 17 U/L (ref 0–37)
Albumin: 4.7 g/dL (ref 3.5–5.2)
Alkaline Phosphatase: 44 U/L (ref 39–117)
BUN: 21 mg/dL (ref 6–23)
CALCIUM: 10 mg/dL (ref 8.4–10.5)
CO2: 28 meq/L (ref 19–32)
Chloride: 103 mEq/L (ref 96–112)
Creatinine, Ser: 1.36 mg/dL (ref 0.40–1.50)
GFR: 54.35 mL/min — AB (ref >60.00)
Glucose, Bld: 143 mg/dL — ABNORMAL HIGH (ref 70–99)
Potassium: 4.7 mEq/L (ref 3.5–5.1)
Sodium: 139 mEq/L (ref 135–145)
Total Bilirubin: 1 mg/dL (ref 0.2–1.2)
Total Protein: 7.6 g/dL (ref 6.0–8.3)

## 2018-01-22 MED ORDER — ZOSTER VAC RECOMB ADJUVANTED 50 MCG/0.5ML IM SUSR: 0.5000 mL | Freq: Once | INTRAMUSCULAR | 1 refills | Status: AC

## 2018-01-22 NOTE — Assessment & Plan Note (Signed)
BP and diabetes at goal. Checking CMP and adjust as needed.

## 2018-01-22 NOTE — Assessment & Plan Note (Signed)
Taking vytorin 10/80 mg daily. Checking lipid panel as last was not consistent with usual. Adjust as needed.

## 2018-01-22 NOTE — Patient Instructions (Signed)

## 2018-01-22 NOTE — Progress Notes (Signed)
   Subjective:    Patient ID: Clifford [name], male    DOB: 01-05-1946, 72 y.o.   MRN: [id number]  HPI Here for medicare wellness and physical, no new complaints. Please see A/P for status and treatment of chronic medical problems.   Diet: heart healthy Physical activity: walks 3 miles per day Depression/mood screen: negative Hearing: intact to whispered voice Visual acuity: grossly normal, performs annual eye exam  ADLs: capable Fall risk: none Home safety: good Cognitive evaluation: intact to orientation, naming, recall and repetition EOL planning: adv directives discussed  I have personally reviewed and have noted 1. The patient's medical and social history - reviewed today no changes 2. Their use of alcohol, tobacco or illicit drugs 3. Their current medications and supplements 4. The patient's functional ability including ADL's, fall risks, home safety risks and hearing or visual impairment. 5. Diet and physical activities 6. Evidence for depression or mood disorders 7. Care team reviewed and updated (available in snapshot)  Review of Systems  Constitutional: Negative.   HENT: Negative.   Eyes: Negative.   Respiratory: Negative for cough, chest tightness and shortness of breath.   Cardiovascular: Negative for chest pain, palpitations and leg swelling.  Gastrointestinal: Negative for abdominal distention, abdominal pain, constipation, diarrhea, nausea and vomiting.  Musculoskeletal: Negative.   Skin: Negative.   Neurological: Negative.   Psychiatric/Behavioral: Negative.       Objective:   Physical Exam  Constitutional: He is oriented to person, place, and time. He appears well-developed and well-nourished.  HENT:  Head: Normocephalic and atraumatic.  Eyes: EOM are normal.  Neck: Normal range of motion.  Cardiovascular: Normal rate and regular rhythm.  Pulmonary/Chest: Effort normal and breath sounds normal. No respiratory distress. He has no wheezes. He has no  rales.  Abdominal: Soft. Bowel sounds are normal. He exhibits no distension. There is no tenderness. There is no rebound.  Musculoskeletal: He exhibits no edema.  Neurological: He is alert and oriented to person, place, and time. Coordination normal.  Skin: Skin is warm and dry.  Second degree burn right shin with exposed fat layer about 1 cm by 2-3 cm rectangle shape, not infected appearing  Psychiatric: He has a normal mood and affect.   Vitals:   01/22/18 0758  BP: 122/80  Pulse: 79  Temp: 97.7 F (36.5 C)  TempSrc: Oral  SpO2: 97%  Weight: 175 lb (79.4 kg)  Height: 5\' 5"  (1.651 m)      Assessment & Plan:  Flu shot given at visit

## 2018-01-22 NOTE — Assessment & Plan Note (Signed)
Flu shot given. Pneumonia complete. Shingrix rx given. Tetanus up to date. Colonoscopy up to date. Counseled about sun safety and mole surveillance. Counseled about the dangers of distracted driving. Given 10 year screening recommendations.

## 2018-01-22 NOTE — Assessment & Plan Note (Addendum)
Checking HgA1c and adjust as needed. Diet controlled at this time. Some mild neuropathy on exam today. On ACE-I and statin. Foot exam up to date, eye exam up to date.

## 2018-01-22 NOTE — Assessment & Plan Note (Signed)
BP at goal on lisinopril 10 mg daily and metoprolol 25 mg daily. Checking CMP and adjust as needed.

## 2018-01-28 ENCOUNTER — Other Ambulatory Visit: Payer: Self-pay

## 2018-01-28 MED ORDER — EZETIMIBE-SIMVASTATIN 10-80 MG PO TABS: 1.0000 | ORAL_TABLET | Freq: Every day | ORAL | 3 refills | Status: DC

## 2018-04-25 ENCOUNTER — Encounter: Payer: Self-pay | Admitting: Internal Medicine

## 2018-11-21 ENCOUNTER — Other Ambulatory Visit: Payer: Self-pay

## 2018-11-21 ENCOUNTER — Other Ambulatory Visit: Payer: Self-pay | Admitting: Internal Medicine

## 2018-11-21 MED ORDER — EZETIMIBE-SIMVASTATIN 10-80 MG PO TABS: 1.0000 | ORAL_TABLET | Freq: Every day | ORAL | 0 refills | Status: DC

## 2018-11-21 MED ORDER — EZETIMIBE-SIMVASTATIN 10-80 MG PO TABS: 1.0000 | ORAL_TABLET | Freq: Every day | ORAL | 3 refills | Status: DC

## 2018-11-21 NOTE — Telephone Encounter (Signed)
Patient is requesting 30 day supply sent to local pharmacy ,as well as sent to [facility].

## 2018-11-21 NOTE — Telephone Encounter (Signed)
Medication Refill - Medication: ezetimibe-simvastatin (VYTORIN) 10-80 MG tablet Pt's medication has been delayed by [facility] for delivery. They will not ship his medication until 11/29/18. Requesting 1 month supply to be sent to his local pharmacy. Please advise. Please follow up with pt. [phone]  Has the patient contacted their pharmacy? Yes.   (Agent: If no, request that the patient contact the pharmacy for the refill.) (Agent: If yes, when and what did the pharmacy advise?)  Preferred Pharmacy (with phone number or street name):  [facility] [address], [place] [phone] (Phone) [phone] (Fax)     Agent: Please be advised that RX refills may take up to 3 business days. We ask that you follow-up with your pharmacy.

## 2018-12-09 LAB — HM DIABETES EYE EXAM

## 2018-12-11 ENCOUNTER — Encounter: Payer: Self-pay | Admitting: Internal Medicine

## 2018-12-11 NOTE — Progress Notes (Signed)
Abstracted and sent to scan  

## 2018-12-18 ENCOUNTER — Other Ambulatory Visit: Payer: Self-pay | Admitting: Internal Medicine

## 2018-12-30 ENCOUNTER — Ambulatory Visit: Payer: Medicare Other | Admitting: Cardiovascular Disease

## 2019-01-02 ENCOUNTER — Other Ambulatory Visit: Payer: Self-pay | Admitting: Cardiovascular Disease

## 2019-01-13 NOTE — Progress Notes (Signed)
Cardiology Office Note:    Date:  01/16/2019   ID:  ANTWYNE PINGREE, Clifford [name] [birth year], MRN [id number]  PCP:  Hoyt Koch, MD  Cardiologist:  Dr. Jenkins Rouge   Electrophysiologist:  n/a  Referring MD: Hoyt Koch, *   Post CABG F/U  History of Present Illness:     Clifford [name] is a 73 y.o. male with a hx of CAD status post prior PCI in 1995, 2000 and 2002, diabetes, HTN, HL, prostate CA status post prostatectomy.    Admitted 5/6-5/18/17  with unstable angina. Cardiac enzymes remained negative. Cardiac catheterization demonstrated 3 vessel CAD and CABG was recommended. He was seen by Dr. Servando Snare and underwent CABG with LIMA-LAD, SVG-PLVB, SVG-DX. Postoperative course was notable for volume excess as well as worsening creatinine.   No angina compliant with meds   Needs f/u carotid duplex      Past Medical History:  Diagnosis Date  . CAD (coronary artery disease) 2005   a. Previous stents RCA and circ, Cath 2005 patient with 70% PDA  //  b. [place] 5/17 >> LHC oLCx 50, pLAD 50, mLAD 95, RPDA 80, normal LVEF >> s/p CABG  . Carotid artery disease ([place])    a. Carotid US 5/17 bilat ICA 1-39%  . Cataract    replace 11/2012  . Diabetes mellitus    diet controlled  . History of detached retina repair 11-06-2006   [facility]  . History of echocardiogram    a. EF 55% to 60%. Wall motion was normal, Gr 1 DD, Trivial MR/TR  . History of hematuria   . History of prostate cancer 03/2005   s/p prostatectomy  . Hyperlipidemia   . Hypertension     Past Surgical History:  Procedure Laterality Date  . ANGIOPLASTY     1995,2000,2002  . CARDIAC CATHETERIZATION  05.31.2002   Initially the stenosis in the proximal to mid right coronary artery was estimated ar 95%. Following stenting, this improved to 0%. There was residual mild disease in the sostium of the proximal right coronary atrtery. Successful stentng of the proximal to mid right coronary artery with improvement  in stent diameter narrowing from 95% to 0% Proc Date  07/29/2000  . CARDIAC CATHETERIZATION N/A 07/04/2015   Procedure: Left Heart Cath and Coronary Angiography;  Surgeon: Jettie Booze, MD;  Location: [place] [facility];  Service: Cardiovascular;  Laterality: N/A;  . CATARACT EXTRACTION W/ INTRAOCULAR LENS IMPLANT Bilateral OS Sept '14; OD Oct '14   Dr. Bing Plume  . COLONOSCOPY    . CORONARY ARTERY BYPASS GRAFT N/A 07/08/2015   Procedure: CORONARY ARTERY BYPASS GRAFTING (CABG)x three using left internal mammary artery to left anterior decending artery, left greater saphenous vein grafts to diagonal and posterolateral. Left leg greater saphenous leg vein via endoscope. ;  Surgeon: [name], MD;  Location: [place];  Service: Open Heart Surgery;  Laterality: N/A;  . CYSTOSCOPY N/A 07/08/2015   Procedure: CYSTOSCOPY FLEXIBLE WITH BALLOON DILATION OF BLADDER NECK CONTRACTURE WITH DIFFICULT CATHETER PLACEMENT;  Surgeon: Raynelle Bring, MD;  Location: [place];  Service: Urology;  Laterality: N/A;  . EYE SURGERY  11/24/2012   left eye cataract  . EYE SURGERY  12/22/2012   right eye cataract  . HEMORRHOID SURGERY     I&D of thrombosed hemorrhoid  . LIPOMA EXCISION    . PROSTATECTOMY  03/2005  . PTCA  2005  . TEE WITHOUT CARDIOVERSION N/A 07/08/2015   Procedure: TRANSESOPHAGEAL ECHOCARDIOGRAM (TEE);  Surgeon: [name], MD;  Location: [place];  Service: Open Heart Surgery;  Laterality: N/A;  . WRIST GANGLION EXCISION      Current Medications: Outpatient Medications Prior to Visit  Medication Sig Dispense Refill  . Ascorbic Acid (VITAMIN C PO) Take 1 tablet by mouth daily.    Marland Kitchen aspirin EC 81 MG tablet Take 81 mg by mouth once.     . blood glucose meter kit and supplies Dispense based on patient and insurance preference. Use up to four times daily as directed. (FOR ICD-9 250.00, 250.01). 1 each 0  . ezetimibe-simvastatin (VYTORIN) 10-80 MG tablet TAKE 1 TABLET BY MOUTH  DAILY 90 tablet 0  .  lisinopril (ZESTRIL) 10 MG tablet Take 1 tablet (10 mg total) by mouth daily. Pt needs to keep upcoming appt in Nov for further refills 60 tablet 0  . metoprolol succinate (TOPROL-XL) 25 MG 24 hr tablet Take 1 tablet (25 mg total) by mouth daily. 90 tablet 3  . Multiple Vitamin (MULTIVITAMIN) tablet Take 1 tablet by mouth daily.      . ONE TOUCH ULTRA TEST test strip 1 each by Other route 2 (two) times daily. Use to check blood sugars twice a day Dx E11.9 100 each 5  . ONETOUCH DELICA LANCETS 07P MISC Use to help check blood sugars twice a day Dx e11.9 100 each 5   No facility-administered medications prior to visit.       Allergies:   Cinnamon   Social History   Socioeconomic History  . Marital status: Married    Spouse name: Not on file  . Number of children: Not on file  . Years of education: Not on file  . Highest education level: Not on file  Occupational History  . Not on file  Social Needs  . Financial resource strain: Not on file  . Food insecurity    Worry: Not on file    Inability: Not on file  . Transportation needs    Medical: Not on file    Non-medical: Not on file  Tobacco Use  . Smoking status: Former Smoker    Quit date: 02/27/1983    Years since quitting: 35.9  . Smokeless tobacco: Never Used  Substance and Sexual Activity  . Alcohol use: No    Alcohol/week: 0.0 standard drinks  . Drug use: No  . Sexual activity: Yes    Partners: Female  Lifestyle  . Physical activity    Days per week: Not on file    Minutes per session: Not on file  . Stress: Not on file  Relationships  . Social Herbalist on phone: Not on file    Gets together: Not on file    Attends religious service: Not on file    Active member of club or organization: Not on file    Attends meetings of clubs or organizations: Not on file    Relationship status: Not on file  Other Topics Concern  . 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 ACP - discussed with patient and provided packet and referral to [email or web address] Nov '15. He already has stated that if he is :"comatose" pull the plug.      Family History:  The patient's family history includes CAD in his father; Colon polyps in his sister; Diabetes in his  sister.   ROS:   Please see the history of present illness.    Review of Systems  Cardiovascular: Positive for chest pain and leg swelling.  Gastrointestinal: Positive for constipation.   All other systems reviewed and are negative.   Physical Exam:    VS:  BP 130/82   Pulse 76   Ht 5' 4" (1.626 m)   Wt 174 lb (78.9 kg)   SpO2 98%   BMI 29.87 kg/m    Affect appropriate Healthy:  appears stated age 72: Scar over lip  Neck supple with no adenopathy JVP normal no bruits no thyromegaly Lungs clear with no wheezing and good diaphragmatic motion Heart:  S1/S2 no murmur, no rub, gallop or click PMI normal Abdomen: benighn, BS positve, no tenderness, no AAA no bruit.  No HSM or HJR Distal pulses intact with no bruits Trace LE bilateral  edema Neuro non-focal Skin warm and dry No muscular weakness   Wt Readings from Last 3 Encounters:  01/16/19 174 lb (78.9 kg)  01/22/18 175 lb (79.4 kg)  12/26/17 173 lb 8 oz (78.7 kg)      Studies/Labs Reviewed:     EKG:  1120//20 SR rate 24 old IMI   Recent Labs: 01/22/2018: ALT 19; BUN 21; Creatinine, Ser 1.36; Hemoglobin 17.5; Platelets 259.0; Potassium 4.7; Sodium 139   Recent Lipid Panel    Component Value Date/Time   CHOL 119 01/22/2018 0847   TRIG 130.0 01/22/2018 0847   TRIG 133 02/25/2006 0822   HDL 40.10 01/22/2018 0847   CHOLHDL 3 01/22/2018 0847   VLDL 26.0 01/22/2018 0847   LDLCALC 53 01/22/2018 0847   LDLDIRECT 181.0 07/23/2017 0832    Additional studies/ records that were reviewed today include:   Intraop TEE 07/08/15 EF 55% to 60%. Wall  motion was normal  Carotid US 07/06/15 Bilaterally - 1% to 39% ICA stenosis. Vertebral artery flow is antegrade.  Echo 07/06/15 EF 55% to 60%. Wall motion was normal, Gr 1 DD, Trivial MR/TR  LHC 07/04/15  Ost Cx lesion, 50% stenosed.  Mid LAD lesion, 95% stenosed. Severe, diffuse disease in the mid to distal LAD which is all heavily calcified. The origin of a moderate-sized diagonal is also compromised.  Prox LAD to Mid LAD lesion, 50% stenosed.  RPDA lesion, 80% stenosed.  The left ventricular systolic function is normal.  Normal LVEDP.   ASSESSMENT:     No diagnosis found.  PLAN:     In order of problems listed above:  1. CABG:  07/08/15  Continue ASA and beta blocker no angina , active stable   2. HTN - Improved on higher dose ACE   3. HL - Continue high-dose statin. Recent LDL 51.  4. CKD - stable f/u primary avoid over diuresis  Baseline Cr around 1.2   5. Carotid artery disease - Mild bilat plaque by [name] 2017 will update  6. DM - FU with PCP for strict control.  He is not on any meds now  Lab Results  Component Value Date   HGBA1C 7.2 (H) 01/22/2018   7. Edema - improved off diuretics now due to CRF and azotemia  08/02/15 duplex no DVT  F/U in a year Carotid Duplex ordered    Jenkins Rouge

## 2019-01-16 ENCOUNTER — Encounter: Payer: Self-pay | Admitting: Cardiovascular Disease

## 2019-01-16 ENCOUNTER — Other Ambulatory Visit: Payer: Self-pay

## 2019-01-16 ENCOUNTER — Ambulatory Visit (INDEPENDENT_AMBULATORY_CARE_PROVIDER_SITE_OTHER): Payer: Medicare Other | Admitting: Cardiovascular Disease

## 2019-01-16 VITALS — BP 130/82 | HR 76 | Ht 64.0 in | Wt 174.0 lb

## 2019-01-16 DIAGNOSIS — I779 Disorder of arteries and arterioles, unspecified: Secondary | ICD-10-CM

## 2019-01-16 NOTE — Patient Instructions (Signed)
Medication Instructions:   *If you need a refill on your cardiac medications before your next appointment, please call your pharmacy*  Lab Work:  If you have labs (blood work) drawn today and your tests are completely normal, you will receive your results only by: Marland Kitchen MyChart Message (if you have MyChart) OR . A paper copy in the mail If you have any lab test that is abnormal or we need to change your treatment, we will call you to review the results.  Testing/Procedures: Your physician has requested that you have a carotid duplex. This test is an ultrasound of the carotid arteries in your neck. It looks at blood flow through these arteries that supply the brain with blood. Allow one hour for this exam. There are no restrictions or special instructions.  Follow-Up: At [facility], you and your health needs are our priority.  As part of our continuing mission to provide you with exceptional heart care, we have created designated Provider Care Teams.  These Care Teams include your primary Cardiologist (physician) and Advanced Practice Providers (APPs -  Physician Assistants and Nurse Practitioners) who all work together to provide you with the care you need, when you need it.  Your next appointment:   12 months  The format for your next appointment:   In Person  Provider:   You may see Jenkins Rouge, MD or one of the following Advanced Practice Providers on your designated Care Team:    Truitt Merle, NP  Cecilie Kicks, NP  Kathyrn Drown, NP

## 2019-01-26 ENCOUNTER — Other Ambulatory Visit: Payer: Self-pay

## 2019-01-26 ENCOUNTER — Other Ambulatory Visit (INDEPENDENT_AMBULATORY_CARE_PROVIDER_SITE_OTHER): Payer: Medicare Other

## 2019-01-26 ENCOUNTER — Encounter: Payer: Self-pay | Admitting: Internal Medicine

## 2019-01-26 ENCOUNTER — Ambulatory Visit (INDEPENDENT_AMBULATORY_CARE_PROVIDER_SITE_OTHER): Payer: Medicare Other | Admitting: Internal Medicine

## 2019-01-26 VITALS — BP 130/80 | HR 88 | Temp 98.4°F | Ht 64.0 in | Wt 175.0 lb

## 2019-01-26 DIAGNOSIS — I10 Essential (primary) hypertension: Secondary | ICD-10-CM | POA: Diagnosis not present

## 2019-01-26 DIAGNOSIS — Z23 Encounter for immunization: Secondary | ICD-10-CM | POA: Diagnosis not present

## 2019-01-26 DIAGNOSIS — E119 Type 2 diabetes mellitus without complications: Secondary | ICD-10-CM

## 2019-01-26 DIAGNOSIS — Z0000 Encounter for general adult medical examination without abnormal findings: Secondary | ICD-10-CM

## 2019-01-26 DIAGNOSIS — N1831 Chronic kidney disease, stage 3a: Secondary | ICD-10-CM

## 2019-01-26 DIAGNOSIS — E1169 Type 2 diabetes mellitus with other specified complication: Secondary | ICD-10-CM

## 2019-01-26 DIAGNOSIS — E785 Hyperlipidemia, unspecified: Secondary | ICD-10-CM

## 2019-01-26 LAB — LIPID PANEL
Cholesterol: 148 mg/dL (ref 0–200)
HDL: 35.7 mg/dL — ABNORMAL LOW (ref >39.00)
LDL Cholesterol: 77 mg/dL (ref 0–99)
NonHDL: 112.63
Total CHOL/HDL Ratio: 4
Triglycerides: 179 mg/dL — ABNORMAL HIGH (ref 0.0–149.0)
VLDL: 35.8 mg/dL (ref 0.0–40.0)

## 2019-01-26 LAB — COMPREHENSIVE METABOLIC PANEL
ALT: 20 U/L (ref 0–53)
AST: 19 U/L (ref 0–37)
Albumin: 4.5 g/dL (ref 3.5–5.2)
Alkaline Phosphatase: 50 U/L (ref 39–117)
BUN: 23 mg/dL (ref 6–23)
CO2: 25 mEq/L (ref 19–32)
Calcium: 10.1 mg/dL (ref 8.4–10.5)
Chloride: 103 mEq/L (ref 96–112)
Creatinine, Ser: 1.27 mg/dL (ref 0.40–1.50)
GFR: 55.19 mL/min — ABNORMAL LOW (ref >60.00)
Glucose, Bld: 135 mg/dL — ABNORMAL HIGH (ref 70–99)
Potassium: 4.6 mEq/L (ref 3.5–5.1)
Sodium: 139 mEq/L (ref 135–145)
Total Bilirubin: 1.1 mg/dL (ref 0.2–1.2)
Total Protein: 7.4 g/dL (ref 6.0–8.3)

## 2019-01-26 LAB — CBC
HCT: 50 % (ref 39.0–52.0)
Hemoglobin: 17.3 g/dL — ABNORMAL HIGH (ref 13.0–17.0)
MCHC: 34.5 g/dL (ref 30.0–36.0)
MCV: 95.2 fl (ref 78.0–100.0)
Platelets: 240 10*3/uL (ref 150.0–400.0)
RBC: 5.25 Mil/uL (ref 4.22–5.81)
RDW: 12.6 % (ref 11.5–15.5)
WBC: 8.3 10*3/uL (ref 4.0–10.5)

## 2019-01-26 LAB — MICROALBUMIN / CREATININE URINE RATIO
Creatinine,U: 203 mg/dL
Microalb Creat Ratio: 0.6 mg/g (ref 0.0–30.0)
Microalb, Ur: 1.3 mg/dL (ref 0.0–1.9)

## 2019-01-26 LAB — HEMOGLOBIN A1C: Hgb A1c MFr Bld: 7.5 % — ABNORMAL HIGH (ref 4.6–6.5)

## 2019-01-26 NOTE — Assessment & Plan Note (Signed)
Flu shot given today. Pneumonia complete. Shingrix counseled. Tetanus due 2021. Colonoscopy due 2022. Counseled about sun safety and mole surveillance. Counseled about the dangers of distracted driving. Given 10 year screening recommendations.

## 2019-01-26 NOTE — Progress Notes (Signed)
Subjective:   Patient ID: Clifford [name], male    DOB: 02-22-1946, 73 y.o.   MRN: [id number]  HPI Here for medicare wellness and physical, no new complaints. Please see A/P for status and treatment of chronic medical problems.   Diet: DM since diabetic Physical activity: walks 3 miles daily Depression/mood screen: negative Hearing: intact to whispered voice, mild loss bilaterally Visual acuity: grossly normal, performs annual eye exam  ADLs: capable Fall risk: none Home safety: good Cognitive evaluation: intact to orientation, naming, recall and repetition EOL planning: adv directives discussed    Office Visit from 01/26/2019 in [facility]  PHQ-2 Total Score  0      I have personally reviewed and have noted 1. The patient's medical and social history - reviewed today no changes 2. Their use of alcohol, tobacco or illicit drugs 3. Their current medications and supplements 4. The patient's functional ability including ADL's, fall risks, home safety risks and hearing or visual impairment. 5. Diet and physical activities 6. Evidence for depression or mood disorders 7. Care team reviewed and updated  Patient Care Team: Hoyt Koch, MD as PCP - General (Internal Medicine) Josue Hector, MD as PCP - Cardiology (Cardiology) Josue Hector, MD (Cardiology) Raynelle Bring, MD (Urology) Calvert Cantor, MD (Ophthalmology) Zebedee Iba., MD (Ophthalmology) Gatha Mayer, MD (Gastroenterology) Past Medical History:  Diagnosis Date  . CAD (coronary artery disease) 2005   a. Previous stents RCA and circ, Cath 2005 patient with 70% PDA  //  b. [place] 5/17 >> LHC oLCx 50, pLAD 50, mLAD 95, RPDA 80, normal LVEF >> s/p CABG  . Carotid artery disease ([place])    a. Carotid US 5/17 bilat ICA 1-39%  . Cataract    replace 11/2012  . Diabetes mellitus    diet controlled  . History of detached retina repair 11-06-2006   [facility]  . History of  echocardiogram    a. EF 55% to 60%. Wall motion was normal, Gr 1 DD, Trivial MR/TR  . History of hematuria   . History of prostate cancer 03/2005   s/p prostatectomy  . Hyperlipidemia   . Hypertension    Past Surgical History:  Procedure Laterality Date  . ANGIOPLASTY     1995,2000,2002  . CARDIAC CATHETERIZATION  05.31.2002   Initially the stenosis in the proximal to mid right coronary artery was estimated ar 95%. Following stenting, this improved to 0%. There was residual mild disease in the sostium of the proximal right coronary atrtery. Successful stentng of the proximal to mid right coronary artery with improvement in stent diameter narrowing from 95% to 0% Proc Date  07/29/2000  . CARDIAC CATHETERIZATION N/A 07/04/2015   Procedure: Left Heart Cath and Coronary Angiography;  Surgeon: Jettie Booze, MD;  Location: [place] [facility];  Service: Cardiovascular;  Laterality: N/A;  . CATARACT EXTRACTION W/ INTRAOCULAR LENS IMPLANT Bilateral OS Sept '14; OD Oct '14   Dr. Bing Plume  . COLONOSCOPY    . CORONARY ARTERY BYPASS GRAFT N/A 07/08/2015   Procedure: CORONARY ARTERY BYPASS GRAFTING (CABG)x three using left internal mammary artery to left anterior decending artery, left greater saphenous vein grafts to diagonal and posterolateral. Left leg greater saphenous leg vein via endoscope. ;  Surgeon: [name], MD;  Location: [place];  Service: Open Heart Surgery;  Laterality: N/A;  . CYSTOSCOPY N/A 07/08/2015   Procedure: CYSTOSCOPY FLEXIBLE WITH BALLOON DILATION OF BLADDER NECK CONTRACTURE WITH DIFFICULT  CATHETER PLACEMENT;  Surgeon: Raynelle Bring, MD;  Location: [place];  Service: Urology;  Laterality: N/A;  . EYE SURGERY  11/24/2012   left eye cataract  . EYE SURGERY  12/22/2012   right eye cataract  . HEMORRHOID SURGERY     I&D of thrombosed hemorrhoid  . LIPOMA EXCISION    . PROSTATECTOMY  03/2005  . PTCA  2005  . TEE WITHOUT CARDIOVERSION N/A 07/08/2015   Procedure: TRANSESOPHAGEAL  ECHOCARDIOGRAM (TEE);  Surgeon: [name], MD;  Location: [place];  Service: Open Heart Surgery;  Laterality: N/A;  . WRIST GANGLION EXCISION     Family History  Problem Relation Age of Onset  . CAD Father   . Diabetes Sister   . Colon polyps Sister   . Cancer Neg Hx        Colon  . Colon cancer Neg Hx   . Esophageal cancer Neg Hx   . Rectal cancer Neg Hx   . Stomach cancer Neg Hx    Review of Systems  Constitutional: Negative.   HENT: Negative.   Eyes: Negative.   Respiratory: Negative for cough, chest tightness and shortness of breath.   Cardiovascular: Negative for chest pain, palpitations and leg swelling.  Gastrointestinal: Negative for abdominal distention, abdominal pain, constipation, diarrhea, nausea and vomiting.  Musculoskeletal: Negative.   Skin: Negative.   Neurological: Negative.   Psychiatric/Behavioral: Negative.     Objective:  Physical Exam Constitutional:      Appearance: He is well-developed.  HENT:     Head: Normocephalic and atraumatic.  Neck:     Musculoskeletal: Normal range of motion.  Cardiovascular:     Rate and Rhythm: Normal rate and regular rhythm.  Pulmonary:     Effort: Pulmonary effort is normal. No respiratory distress.     Breath sounds: Normal breath sounds. No wheezing or rales.  Abdominal:     General: Bowel sounds are normal. There is no distension.     Palpations: Abdomen is soft.     Tenderness: There is no abdominal tenderness. There is no rebound.     Comments: Midline hernia  Skin:    General: Skin is warm and dry.     Comments: Foot exam done  Neurological:     Mental Status: He is alert and oriented to person, place, and time.     Coordination: Coordination normal.     Vitals:   01/26/19 0753  BP: 130/80  Pulse: 88  Temp: 98.4 F (36.9 C)  TempSrc: Oral  SpO2: 98%  Weight: 175 lb (79.4 kg)  Height: 5\' 4"  (1.626 m)    This visit occurred during the SARS-CoV-2 public health emergency.  Safety  protocols were in place, including screening questions prior to the visit, additional usage of staff PPE, and extensive cleaning of exam room while observing appropriate contact time as indicated for disinfecting solutions.   Assessment & Plan:  Flu shot given at visit

## 2019-01-26 NOTE — Patient Instructions (Signed)

## 2019-01-26 NOTE — Assessment & Plan Note (Signed)
BP at goal on lisinopril. Checking CMP and adjust as needed.  

## 2019-01-26 NOTE — Assessment & Plan Note (Signed)
Diet controlled. Checking HgA1c, microalbumin to creatinine ratio, lipid panel. Eye exam up to date. Foot exam done. On statin and ACE-I.

## 2019-01-26 NOTE — Assessment & Plan Note (Signed)
Checking lipid panel and adjust simvastatin as needed.  

## 2019-01-26 NOTE — Assessment & Plan Note (Signed)
Stable, checking renal function today. HTN at goal and DM at goal.

## 2019-01-27 ENCOUNTER — Ambulatory Visit (HOSPITAL_COMMUNITY)
Admission: RE | Admit: 2019-01-27 | Discharge: 2019-01-27 | Disposition: A | Discharge status: Home / Self Care | Payer: Medicare Other | Source: Ambulatory Visit | Attending: Cardiology | Admitting: Cardiology

## 2019-01-27 DIAGNOSIS — I779 Disorder of arteries and arterioles, unspecified: Secondary | ICD-10-CM

## 2019-02-20 ENCOUNTER — Other Ambulatory Visit: Payer: Self-pay | Admitting: Internal Medicine

## 2019-03-09 ENCOUNTER — Other Ambulatory Visit: Payer: Self-pay | Admitting: Cardiovascular Disease

## 2019-03-23 ENCOUNTER — Ambulatory Visit: Payer: Medicare Other | Attending: Internal Medicine

## 2019-03-23 DIAGNOSIS — Z23 Encounter for immunization: Secondary | ICD-10-CM | POA: Insufficient documentation

## 2019-03-23 NOTE — Progress Notes (Signed)
   Covid-19 Vaccination Clinic  Name:  Clifford [name]    MRN: [id number] DOB: 18-Dec-1945  03/23/2019  [name] was observed post Covid-19 immunization for 15 minutes without incidence. He was provided with Vaccine Information Sheet and instruction to access the V-Safe system.   [name] was instructed to call 911 with any severe reactions post vaccine: Marland Kitchen Difficulty breathing  . Swelling of your face and throat  . A fast heartbeat  . A bad rash all over your body  . Dizziness and weakness    Immunizations Administered    Name Date Dose VIS Date Route   Pfizer COVID-19 Vaccine 03/23/2019  8:30 AM 0.3 mL 02/06/2019 Intramuscular   Manufacturer: [place]   Lot: [id number]   [place]: [id number]

## 2019-04-13 ENCOUNTER — Ambulatory Visit: Payer: Medicare Other | Attending: Internal Medicine

## 2019-04-13 DIAGNOSIS — Z23 Encounter for immunization: Secondary | ICD-10-CM | POA: Insufficient documentation

## 2019-04-13 NOTE — Progress Notes (Signed)
   Covid-19 Vaccination Clinic  Name:  Clifford [name]    MRN: [id number] DOB: 1945/12/04  04/13/2019  [name] was observed post Covid-19 immunization for 15 minutes without incidence. He was provided with Vaccine Information Sheet and instruction to access the V-Safe system.   [name] was instructed to call 911 with any severe reactions post vaccine: Marland Kitchen Difficulty breathing  . Swelling of your face and throat  . A fast heartbeat  . A bad rash all over your body  . Dizziness and weakness    Immunizations Administered    Name Date Dose VIS Date Route   Pfizer COVID-19 Vaccine 04/13/2019  8:42 AM 0.3 mL 02/06/2019 Intramuscular   Manufacturer: [place]   Lot: [id number]   [place]: [id number]

## 2019-07-07 ENCOUNTER — Telehealth: Payer: Self-pay | Admitting: Internal Medicine

## 2019-07-07 NOTE — Progress Notes (Signed)
  Chronic Care Management   Outreach Note  07/07/2019 Name: Clifford [name] MRN: [id number] DOB: 05/29/1945  Referred by: Hoyt Koch, MD Reason for referral : No chief complaint on file.    Chronic Care Management   Note  07/07/2019 Name: Clifford [name] MRN: [id number] DOB: 10-24-1945  [name] is a 74 y.o. year old male who is a primary care patient of Hoyt Koch, MD. I reached out to [name] by phone today in response to a referral sent by [name] PCP, Hoyt Koch, MD.   [name] was given information about Chronic Care Management services today including:  1. CCM service includes personalized support from designated clinical staff supervised by his physician, including individualized plan of care and coordination with other care providers 2. 24/7 contact phone numbers for assistance for urgent and routine care needs. 3. Service will only be billed when office clinical staff spend 20 minutes or more in a month to coordinate care. 4. Only one practitioner may furnish and bill the service in a calendar month. 5. The patient may stop CCM services at any time (effective at the end of the month) by phone call to the office staff.   Patient wishes to consider information provided and/or speak with a member of the care team before deciding about enrollment in care management services.   This note is not being shared with the patient for the following reason: To respect privacy (The patient or proxy has requested that the information not be shared).  Follow up plan:   [name] Upstream Scheduler  Follow Up Plan:   SIGNATURE

## 2019-07-09 NOTE — Progress Notes (Signed)
ERROR, DUPLICATE  This note is not being shared with the patient for the following reason: To respect privacy (The patient or proxy has requested that the information not be shared).  [name] Upstream Scheduler

## 2019-12-27 ENCOUNTER — Other Ambulatory Visit: Payer: Self-pay | Admitting: Internal Medicine

## 2020-01-20 ENCOUNTER — Other Ambulatory Visit: Payer: Self-pay | Admitting: Cardiovascular Disease

## 2020-01-26 NOTE — Progress Notes (Signed)
Cardiology Office Note:    Date:  01/29/2020   ID:  Clifford [name], DOB 09-11-1945, MRN [id number]  PCP:  Hoyt Koch, MD  Cardiologist:  Dr. Jenkins Rouge   Electrophysiologist:  n/a  Referring MD: Hoyt Koch, *   Post CABG F/U  History of Present Illness:     Clifford [name] is a 74 y.o. male with a hx of CAD status post prior PCI in 1995, 2000 and 2002, diabetes, HTN, HL, prostate CA status post prostatectomy.    May 2017 accelerated angina needing CABG Dr Servando Snare  LIMA-LAD, SVG-PLVB, SVG-DX.   No angina compliant with meds   Carotids 01/27/19 plaque no stenosis   Had COVID vaccine February 2021   Walking 3 miles/day   LDL 01/26/19 77 on Vytorin   Has been locked up with COVID and eating too much chocolate A1c 7.5 Discussed with Dr [name] And started on Glucophage      Past Medical History:  Diagnosis Date  . CAD (coronary artery disease) 2005   a. Previous stents RCA and circ, Cath 2005 patient with 70% PDA  //  b. [place] 5/17 >> LHC oLCx 50, pLAD 50, mLAD 95, RPDA 80, normal LVEF >> s/p CABG  . Carotid artery disease ([place])    a. Carotid US 5/17 bilat ICA 1-39%  . Cataract    replace 11/2012  . Diabetes mellitus    diet controlled  . History of detached retina repair 11-06-2006   [facility]  . History of echocardiogram    a. EF 55% to 60%. Wall motion was normal, Gr 1 DD, Trivial MR/TR  . History of hematuria   . History of prostate cancer 03/2005   s/p prostatectomy  . Hyperlipidemia   . Hypertension     Past Surgical History:  Procedure Laterality Date  . ANGIOPLASTY     1995,2000,2002  . CARDIAC CATHETERIZATION  05.31.2002   Initially the stenosis in the proximal to mid right coronary artery was estimated ar 95%. Following stenting, this improved to 0%. There was residual mild disease in the sostium of the proximal right coronary atrtery. Successful stentng of the proximal to mid right coronary artery with improvement in stent  diameter narrowing from 95% to 0% Proc Date  07/29/2000  . CARDIAC CATHETERIZATION N/A 07/04/2015   Procedure: Left Heart Cath and Coronary Angiography;  Surgeon: Jettie Booze, MD;  Location: [place] [facility];  Service: Cardiovascular;  Laterality: N/A;  . CATARACT EXTRACTION W/ INTRAOCULAR LENS IMPLANT Bilateral OS Sept '14; OD Oct '14   Dr. Bing Plume  . COLONOSCOPY    . CORONARY ARTERY BYPASS GRAFT N/A 07/08/2015   Procedure: CORONARY ARTERY BYPASS GRAFTING (CABG)x three using left internal mammary artery to left anterior decending artery, left greater saphenous vein grafts to diagonal and posterolateral. Left leg greater saphenous leg vein via endoscope. ;  Surgeon: [name], MD;  Location: [place];  Service: Open Heart Surgery;  Laterality: N/A;  . CYSTOSCOPY N/A 07/08/2015   Procedure: CYSTOSCOPY FLEXIBLE WITH BALLOON DILATION OF BLADDER NECK CONTRACTURE WITH DIFFICULT CATHETER PLACEMENT;  Surgeon: Raynelle Bring, MD;  Location: [place];  Service: Urology;  Laterality: N/A;  . EYE SURGERY  11/24/2012   left eye cataract  . EYE SURGERY  12/22/2012   right eye cataract  . HEMORRHOID SURGERY     I&D of thrombosed hemorrhoid  . LIPOMA EXCISION    . PROSTATECTOMY  03/2005  . PTCA  2005  . TEE  WITHOUT CARDIOVERSION N/A 07/08/2015   Procedure: TRANSESOPHAGEAL ECHOCARDIOGRAM (TEE);  Surgeon: [name], MD;  Location: [place];  Service: Open Heart Surgery;  Laterality: N/A;  . WRIST GANGLION EXCISION      Current Medications: Outpatient Medications Prior to Visit  Medication Sig Dispense Refill  . Ascorbic Acid (VITAMIN C PO) Take 1 tablet by mouth daily.    Marland Kitchen aspirin EC 81 MG tablet Take 81 mg by mouth once.     . blood glucose meter kit and supplies Dispense based on patient and insurance preference. Use up to four times daily as directed. (FOR ICD-9 250.00, 250.01). 1 each 0  . ezetimibe-simvastatin (VYTORIN) 10-80 MG tablet TAKE 1 TABLET BY MOUTH  DAILY 90 tablet 0  .  lisinopril (ZESTRIL) 10 MG tablet Take 1 tablet (10 mg total) by mouth daily. 90 tablet 0  . metFORMIN (GLUCOPHAGE) 1000 MG tablet Take 1 tablet (1,000 mg total) by mouth 2 (two) times daily with a meal. 180 tablet 3  . metoprolol succinate (TOPROL-XL) 25 MG 24 hr tablet Take 1 tablet (25 mg total) by mouth daily. 90 tablet 3  . Multiple Vitamin (MULTIVITAMIN) tablet Take 1 tablet by mouth daily.      . ONE TOUCH ULTRA TEST test strip 1 each by Other route 2 (two) times daily. Use to check blood sugars twice a day Dx E11.9 100 each 5  . ONETOUCH DELICA LANCETS 32D MISC Use to help check blood sugars twice a day Dx e11.9 100 each 5   No facility-administered medications prior to visit.      Allergies:   Cinnamon   Social History   Socioeconomic History  . Marital status: Married    Spouse name: Not on file  . Number of children: Not on file  . Years of education: Not on file  . Highest education level: Not on file  Occupational History  . Not on file  Tobacco Use  . Smoking status: Former Smoker    Quit date: 02/27/1983    Years since quitting: 36.9  . Smokeless tobacco: Never Used  Substance and Sexual Activity  . Alcohol use: No    Alcohol/week: 0.0 standard drinks  . Drug use: No  . Sexual activity: Yes    Partners: Female  Other Topics Concern  . 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 ACP - discussed with patient and provided packet and referral to [email or web address] Nov '15. He already has stated that if he is :"comatose" pull the plug.    Social Determinants of Health   Financial Resource Strain:   . Difficulty of Paying Living Expenses: Not on file  Food Insecurity:   . Worried About Charity fundraiser in the Last Year: Not on file  . Ran Out of Food in the Last Year: Not on file  Transportation Needs:   . Lack of  Transportation (Medical): Not on file  . Lack of Transportation (Non-Medical): Not on file  Physical Activity:   . Days of Exercise per Week: Not on file  . Minutes of Exercise per Session: Not on file  Stress:   . Feeling of Stress : Not on file  Social Connections:   . Frequency of Communication with Friends and Family: Not on file  . Frequency of Social Gatherings with Friends and Family: Not on file  .  Attends Religious Services: Not on file  . Active Member of Clubs or Organizations: Not on file  . Attends Archivist Meetings: Not on file  . Marital Status: Not on file     Family History:  The patient's family history includes CAD in his father; Colon polyps in his sister; Diabetes in his sister.   ROS:   Please see the history of present illness.    Review of Systems  Cardiovascular: Positive for chest pain and leg swelling.  Gastrointestinal: Positive for constipation.   All other systems reviewed and are negative.   Physical Exam:    VS:  BP 136/70   Pulse 80   Ht 5' 4" (1.626 m)   Wt 72.1 kg   SpO2 96%   BMI 27.29 kg/m    Affect appropriate Healthy:  appears stated age 18: Scar over lip  Neck supple with no adenopathy JVP normal no bruits no thyromegaly Lungs clear with no wheezing and good diaphragmatic motion Heart:  S1/S2 no murmur, no rub, gallop or click PMI normal post sternotomy  Abdomen: benighn, BS positve, no tenderness, no AAA no bruit.  No HSM or HJR Distal pulses intact with no bruits Trace LE bilateral  edema Neuro non-focal Skin warm and dry No muscular weakness   Wt Readings from Last 3 Encounters:  01/29/20 72.1 kg  01/27/20 71.2 kg  01/26/19 79.4 kg      Studies/Labs Reviewed:     EKG:  1120//20 SR rate 61 old IMI   Recent Labs: 01/27/2020: ALT 16; BUN 18; Creatinine, Ser 1.28; Hemoglobin 17.0; Platelets 238.0; Potassium 4.2; Sodium 135; TSH 2.24   Recent Lipid Panel    Component Value Date/Time   CHOL 121  01/27/2020 0837   TRIG 138.0 01/27/2020 0837   TRIG 133 02/25/2006 0822   HDL 39.60 01/27/2020 0837   CHOLHDL 3 01/27/2020 0837   VLDL 27.6 01/27/2020 0837   LDLCALC 54 01/27/2020 0837   LDLDIRECT 181.0 07/23/2017 0832    Additional studies/ records that were reviewed today include:   Intraop TEE 07/08/15 EF 55% to 60%. Wall motion was normal  Carotid US 07/06/15 Bilaterally - 1% to 39% ICA stenosis. Vertebral artery flow is antegrade.  Echo 07/06/15 EF 55% to 60%. Wall motion was normal, Gr 1 DD, Trivial MR/TR  LHC 07/04/15  Ost Cx lesion, 50% stenosed.  Mid LAD lesion, 95% stenosed. Severe, diffuse disease in the mid to distal LAD which is all heavily calcified. The origin of a moderate-sized diagonal is also compromised.  Prox LAD to Mid LAD lesion, 50% stenosed.  RPDA lesion, 80% stenosed.  The left ventricular systolic function is normal.  Normal LVEDP.   ASSESSMENT:     No diagnosis found.  PLAN:     In order of problems listed above:  1. CABG:  07/08/15  Continue ASA and beta blocker no angina , active stable   2. HTN - Improved on higher dose ACE   3. HL - Continue vytorin LDL 77   4. CKD - stable f/u primary avoid over diuresis  Baseline Cr around 1.2   5. Carotid artery disease - Mild bilat plaque by [name] 2017 will update  6. DM - A1c too high 7.5  Started on Glucophage but hasn't filled it yet    F/U in a year    Jenkins Rouge

## 2020-01-27 ENCOUNTER — Ambulatory Visit (INDEPENDENT_AMBULATORY_CARE_PROVIDER_SITE_OTHER): Payer: Medicare Other | Admitting: Internal Medicine

## 2020-01-27 ENCOUNTER — Encounter: Payer: Self-pay | Admitting: Internal Medicine

## 2020-01-27 ENCOUNTER — Other Ambulatory Visit: Payer: Self-pay

## 2020-01-27 VITALS — BP 124/78 | HR 91 | Temp 97.9°F | Ht 64.0 in | Wt 157.0 lb

## 2020-01-27 DIAGNOSIS — I10 Essential (primary) hypertension: Secondary | ICD-10-CM

## 2020-01-27 DIAGNOSIS — N1831 Chronic kidney disease, stage 3a: Secondary | ICD-10-CM

## 2020-01-27 DIAGNOSIS — Z0000 Encounter for general adult medical examination without abnormal findings: Secondary | ICD-10-CM | POA: Diagnosis not present

## 2020-01-27 DIAGNOSIS — Z23 Encounter for immunization: Secondary | ICD-10-CM

## 2020-01-27 DIAGNOSIS — E118 Type 2 diabetes mellitus with unspecified complications: Secondary | ICD-10-CM | POA: Diagnosis not present

## 2020-01-27 DIAGNOSIS — E1169 Type 2 diabetes mellitus with other specified complication: Secondary | ICD-10-CM

## 2020-01-27 DIAGNOSIS — E785 Hyperlipidemia, unspecified: Secondary | ICD-10-CM

## 2020-01-27 LAB — CBC
HCT: 49.3 % (ref 39.0–52.0)
Hemoglobin: 17 g/dL (ref 13.0–17.0)
MCHC: 34.5 g/dL (ref 30.0–36.0)
MCV: 91.1 fl (ref 78.0–100.0)
Platelets: 238 10*3/uL (ref 150.0–400.0)
RBC: 5.41 Mil/uL (ref 4.22–5.81)
RDW: 13 % (ref 11.5–15.5)
WBC: 8.8 10*3/uL (ref 4.0–10.5)

## 2020-01-27 LAB — COMPREHENSIVE METABOLIC PANEL
ALT: 16 U/L (ref 0–53)
AST: 14 U/L (ref 0–37)
Albumin: 4.4 g/dL (ref 3.5–5.2)
Alkaline Phosphatase: 85 U/L (ref 39–117)
BUN: 18 mg/dL (ref 6–23)
CO2: 30 mEq/L (ref 19–32)
Calcium: 9.7 mg/dL (ref 8.4–10.5)
Chloride: 98 mEq/L (ref 96–112)
Creatinine, Ser: 1.28 mg/dL (ref 0.40–1.50)
GFR: 54.32 mL/min — ABNORMAL LOW (ref >60.00)
Glucose, Bld: 371 mg/dL — ABNORMAL HIGH (ref 70–99)
Potassium: 4.2 mEq/L (ref 3.5–5.1)
Sodium: 135 mEq/L (ref 135–145)
Total Bilirubin: 1.4 mg/dL — ABNORMAL HIGH (ref 0.2–1.2)
Total Protein: 7.2 g/dL (ref 6.0–8.3)

## 2020-01-27 LAB — LIPID PANEL
Cholesterol: 121 mg/dL (ref 0–200)
HDL: 39.6 mg/dL (ref >39.00)
LDL Cholesterol: 54 mg/dL (ref 0–99)
NonHDL: 81.46
Total CHOL/HDL Ratio: 3
Triglycerides: 138 mg/dL (ref 0.0–149.0)
VLDL: 27.6 mg/dL (ref 0.0–40.0)

## 2020-01-27 LAB — HEMOGLOBIN A1C: Hgb A1c MFr Bld: 14.9 % — ABNORMAL HIGH (ref 4.6–6.5)

## 2020-01-27 LAB — TSH: TSH: 2.24 u[IU]/mL (ref 0.35–4.50)

## 2020-01-27 LAB — VITAMIN B12: Vitamin B-12: 283 pg/mL (ref 211–911)

## 2020-01-27 LAB — VITAMIN D 25 HYDROXY (VIT D DEFICIENCY, FRACTURES): VITD: 28.89 ng/mL — ABNORMAL LOW (ref 30.00–100.00)

## 2020-01-27 NOTE — Assessment & Plan Note (Signed)
Flu shot given. Covid-19 up to date including booster. Pneumonia complete. Shingrix counseled. Tetanus due declines. Colonoscopy due 2022. Counseled about sun safety and mole surveillance. Counseled about the dangers of distracted driving. Given 10 year screening recommendations.

## 2020-01-27 NOTE — Assessment & Plan Note (Signed)
Checking CMP and adjust as needed. Diabetes and HTN both at goal.

## 2020-01-27 NOTE — Progress Notes (Signed)
Subjective:   Patient ID: Clifford [name], male    DOB: 01/10/1946, 74 y.o.   MRN: [id number]  HPI Here for medicare wellness and physical, no new complaints. Please see A/P for status and treatment of chronic medical problems.   Diet: DM since diabetic Physical activity: walks 2-3 miles a day Depression/mood screen: negative Hearing: intact to whispered voice Visual acuity: grossly normal, overdue for annual eye exam due to illness of his eye doctor ADLs: capable Fall risk: low Home safety: good Cognitive evaluation: intact to orientation, naming, recall and repetition EOL planning: adv directives discussed    Office Visit from 01/27/2020 in [place] at [facility] Total Score 0      I have personally reviewed and have noted 1. The patient's medical and social history - reviewed today no changes 2. Their use of alcohol, tobacco or illicit drugs 3. Their current medications and supplements 4. The patient's functional ability including ADL's, fall risks, home safety risks and hearing or visual impairment. 5. Diet and physical activities 6. Evidence for depression or mood disorders 7. Care team reviewed and updated  Patient Care Team: Hoyt Koch, MD as PCP - General (Internal Medicine) Josue Hector, MD as PCP - Cardiology (Cardiology) Josue Hector, MD (Cardiology) Raynelle Bring, MD (Urology) Calvert Cantor, MD (Ophthalmology) Zebedee Iba., MD (Ophthalmology) Gatha Mayer, MD (Gastroenterology) Past Medical History:  Diagnosis Date  . CAD (coronary artery disease) 2005   a. Previous stents RCA and circ, Cath 2005 patient with 70% PDA  //  b. [place] 5/17 >> LHC oLCx 50, pLAD 50, mLAD 95, RPDA 80, normal LVEF >> s/p CABG  . Carotid artery disease ([place])    a. Carotid US 5/17 bilat ICA 1-39%  . Cataract    replace 11/2012  . Diabetes mellitus    diet controlled  . History of detached retina repair 11-06-2006   [facility]  . History  of echocardiogram    a. EF 55% to 60%. Wall motion was normal, Gr 1 DD, Trivial MR/TR  . History of hematuria   . History of prostate cancer 03/2005   s/p prostatectomy  . Hyperlipidemia   . Hypertension    Past Surgical History:  Procedure Laterality Date  . ANGIOPLASTY     1995,2000,2002  . CARDIAC CATHETERIZATION  05.31.2002   Initially the stenosis in the proximal to mid right coronary artery was estimated ar 95%. Following stenting, this improved to 0%. There was residual mild disease in the sostium of the proximal right coronary atrtery. Successful stentng of the proximal to mid right coronary artery with improvement in stent diameter narrowing from 95% to 0% Proc Date  07/29/2000  . CARDIAC CATHETERIZATION N/A 07/04/2015   Procedure: Left Heart Cath and Coronary Angiography;  Surgeon: Jettie Booze, MD;  Location: [place] [facility];  Service: Cardiovascular;  Laterality: N/A;  . CATARACT EXTRACTION W/ INTRAOCULAR LENS IMPLANT Bilateral OS Sept '14; OD Oct '14   Dr. Bing Plume  . COLONOSCOPY    . CORONARY ARTERY BYPASS GRAFT N/A 07/08/2015   Procedure: CORONARY ARTERY BYPASS GRAFTING (CABG)x three using left internal mammary artery to left anterior decending artery, left greater saphenous vein grafts to diagonal and posterolateral. Left leg greater saphenous leg vein via endoscope. ;  Surgeon: [name], MD;  Location: [place];  Service: Open Heart Surgery;  Laterality: N/A;  . CYSTOSCOPY N/A 07/08/2015   Procedure: CYSTOSCOPY FLEXIBLE WITH BALLOON DILATION OF BLADDER  NECK CONTRACTURE WITH DIFFICULT CATHETER PLACEMENT;  Surgeon: Raynelle Bring, MD;  Location: [place];  Service: Urology;  Laterality: N/A;  . EYE SURGERY  11/24/2012   left eye cataract  . EYE SURGERY  12/22/2012   right eye cataract  . HEMORRHOID SURGERY     I&D of thrombosed hemorrhoid  . LIPOMA EXCISION    . PROSTATECTOMY  03/2005  . PTCA  2005  . TEE WITHOUT CARDIOVERSION N/A 07/08/2015   Procedure:  TRANSESOPHAGEAL ECHOCARDIOGRAM (TEE);  Surgeon: [name], MD;  Location: [place];  Service: Open Heart Surgery;  Laterality: N/A;  . WRIST GANGLION EXCISION     Family History  Problem Relation Age of Onset  . CAD Father   . Diabetes Sister   . Colon polyps Sister   . Cancer Neg Hx        Colon  . Colon cancer Neg Hx   . Esophageal cancer Neg Hx   . Rectal cancer Neg Hx   . Stomach cancer Neg Hx    Review of Systems  Constitutional: Positive for unexpected weight change.  HENT: Negative.   Eyes: Negative.   Respiratory: Negative for cough, chest tightness and shortness of breath.   Cardiovascular: Negative for chest pain, palpitations and leg swelling.  Gastrointestinal: Negative for abdominal distention, abdominal pain, constipation, diarrhea, nausea and vomiting.  Musculoskeletal: Negative.   Skin: Negative.   Neurological: Negative.   Psychiatric/Behavioral: Negative.     Objective:  Physical Exam Constitutional:      Appearance: He is well-developed.  HENT:     Head: Normocephalic and atraumatic.  Cardiovascular:     Rate and Rhythm: Normal rate and regular rhythm.  Pulmonary:     Effort: Pulmonary effort is normal. No respiratory distress.     Breath sounds: Normal breath sounds. No wheezing or rales.  Abdominal:     General: Bowel sounds are normal. There is no distension.     Palpations: Abdomen is soft.     Tenderness: There is no abdominal tenderness. There is no rebound.  Musculoskeletal:     Cervical back: Normal range of motion.  Skin:    General: Skin is warm and dry.     Comments: Foot exam done  Neurological:     Mental Status: He is alert and oriented to person, place, and time.     Coordination: Coordination normal.     Vitals:   01/27/20 0800  BP: 124/78  Pulse: 91  Temp: 97.9 F (36.6 C)  TempSrc: Oral  SpO2: 98%  Weight: 157 lb (71.2 kg)  Height: 5\' 4"  (1.626 m)    This visit occurred during the SARS-CoV-2 public health  emergency.  Safety protocols were in place, including screening questions prior to the visit, additional usage of staff PPE, and extensive cleaning of exam room while observing appropriate contact time as indicated for disinfecting solutions.   Assessment & Plan:  Flu shot given at visit

## 2020-01-27 NOTE — Patient Instructions (Signed)
We will check the labs today and have given you the flu shot.   Health Maintenance, Male Adopting a healthy lifestyle and getting preventive care are important in promoting health and wellness. Ask your health care provider about:  The right schedule for you to have regular tests and exams.  Things you can do on your own to prevent diseases and keep yourself healthy. What should I know about diet, weight, and exercise? Eat a healthy diet   Eat a diet that includes plenty of vegetables, fruits, low-fat dairy products, and lean protein.  Do not eat a lot of foods that are high in solid fats, added sugars, or sodium. Maintain a healthy weight Body mass index (BMI) is a measurement that can be used to identify possible weight problems. It estimates body fat based on height and weight. Your health care provider can help determine your BMI and help you achieve or maintain a healthy weight. Get regular exercise Get regular exercise. This is one of the most important things you can do for your health. Most adults should:  Exercise for at least 150 minutes each week. The exercise should increase your heart rate and make you sweat (moderate-intensity exercise).  Do strengthening exercises at least twice a week. This is in addition to the moderate-intensity exercise.  Spend less time sitting. Even light physical activity can be beneficial. Watch cholesterol and blood lipids Have your blood tested for lipids and cholesterol at 76 years of age, then have this test every 5 years. You may need to have your cholesterol levels checked more often if:  Your lipid or cholesterol levels are high.  You are older than 76 years of age.  You are at high risk for heart disease. What should I know about cancer screening? Many types of cancers can be detected early and may often be prevented. Depending on your health history and family history, you may need to have cancer screening at various ages. This may  include screening for:  Colorectal cancer.  Prostate cancer.  Skin cancer.  Lung cancer. What should I know about heart disease, diabetes, and high blood pressure? Blood pressure and heart disease  High blood pressure causes heart disease and increases the risk of stroke. This is more likely to develop in people who have high blood pressure readings, are of African descent, or are overweight.  Talk with your health care provider about your target blood pressure readings.  Have your blood pressure checked: ? Every 3-5 years if you are 40-21 years of age. ? Every year if you are 67 years old or older.  If you are between the ages of 44 and 49 and are a current or former smoker, ask your health care provider if you should have a one-time screening for abdominal aortic aneurysm (AAA). Diabetes Have regular diabetes screenings. This checks your fasting blood sugar level. Have the screening done:  Once every three years after age 53 if you are at a normal weight and have a low risk for diabetes.  More often and at a younger age if you are overweight or have a high risk for diabetes. What should I know about preventing infection? Hepatitis B If you have a higher risk for hepatitis B, you should be screened for this virus. Talk with your health care provider to find out if you are at risk for hepatitis B infection. Hepatitis C Blood testing is recommended for:  Everyone born from [birth year] through 1967.  Anyone with known risk  factors for hepatitis C. Sexually transmitted infections (STIs)  You should be screened each year for STIs, including gonorrhea and chlamydia, if: ? You are sexually active and are younger than 76 years of age. ? You are older than 76 years of age and your health care provider tells you that you are at risk for this type of infection. ? Your sexual activity has changed since you were last screened, and you are at increased risk for chlamydia or gonorrhea. Ask your  health care provider if you are at risk.  Ask your health care provider about whether you are at high risk for HIV. Your health care provider may recommend a prescription medicine to help prevent HIV infection. If you choose to take medicine to prevent HIV, you should first get tested for HIV. You should then be tested every 3 months for as long as you are taking the medicine. Follow these instructions at home: Lifestyle  Do not use any products that contain nicotine or tobacco, such as cigarettes, e-cigarettes, and chewing tobacco. If you need help quitting, ask your health care provider.  Do not use street drugs.  Do not share needles.  Ask your health care provider for help if you need support or information about quitting drugs. Alcohol use  Do not drink alcohol if your health care provider tells you not to drink.  If you drink alcohol: ? Limit how much you have to 0-2 drinks a day. ? Be aware of how much alcohol is in your drink. In the [place] one drink equals one 12 oz bottle of beer (355 mL), one 5 oz glass of wine (148 mL), or one 1 oz glass of hard liquor (44 mL). General instructions  Schedule regular health, dental, and eye exams.  Stay current with your vaccines.  Tell your health care provider if: ? You often feel depressed. ? You have ever been abused or do not feel safe at home. Summary  Adopting a healthy lifestyle and getting preventive care are important in promoting health and wellness.  Follow your health care provider's instructions about healthy diet, exercising, and getting tested or screened for diseases.  Follow your health care provider's instructions on monitoring your cholesterol and blood pressure. This information is not intended to replace advice given to you by your health care provider. Make sure you discuss any questions you have with your health care provider. Document Revised: 02/05/2018 Document Reviewed: 02/05/2018 Elsevier Patient Education   2020 [employer].

## 2020-01-27 NOTE — Assessment & Plan Note (Addendum)
Foot exam done, checking HgA1c. Given 20 pound weight loss likely will not need medication today. On ACE-I and statin. BP at goal. Eye exam upcoming and reminded of need for yearly.

## 2020-01-27 NOTE — Assessment & Plan Note (Signed)
Checking lipid panel and adjust vytorin 10/80 as needed.

## 2020-01-27 NOTE — Assessment & Plan Note (Signed)
BP at goal on lisinopril 10 mg daily, checking CMP and adjust as needed.

## 2020-01-28 ENCOUNTER — Other Ambulatory Visit: Payer: Self-pay | Admitting: Internal Medicine

## 2020-01-28 MED ORDER — METFORMIN HCL 1000 MG PO TABS: 1000.0000 mg | ORAL_TABLET | Freq: Two times a day (BID) | ORAL | 3 refills | Status: DC

## 2020-01-29 ENCOUNTER — Encounter: Payer: Self-pay | Admitting: Cardiovascular Disease

## 2020-01-29 ENCOUNTER — Other Ambulatory Visit: Payer: Self-pay

## 2020-01-29 ENCOUNTER — Ambulatory Visit (INDEPENDENT_AMBULATORY_CARE_PROVIDER_SITE_OTHER): Payer: Medicare Other | Admitting: Cardiovascular Disease

## 2020-01-29 VITALS — BP 136/70 | HR 80 | Ht 64.0 in | Wt 159.0 lb

## 2020-01-29 DIAGNOSIS — Z951 Presence of aortocoronary bypass graft: Secondary | ICD-10-CM | POA: Diagnosis not present

## 2020-01-29 NOTE — Patient Instructions (Signed)
Medication Instructions:  °*If you need a refill on your cardiac medications before your next appointment, please call your pharmacy* ° °Lab Work: °If you have labs (blood work) drawn today and your tests are completely normal, you will receive your results only by: °MyChart Message (if you have MyChart) OR °A paper copy in the mail °If you have any lab test that is abnormal or we need to change your treatment, we will call you to review the results. ° °Testing/Procedures: °None ordered today. ° °Follow-Up: °At [facility], you and your health needs are our priority.  As part of our continuing mission to provide you with exceptional heart care, we have created designated Provider Care Teams.  These Care Teams include your primary Cardiologist (physician) and Advanced Practice Providers (APPs -  Physician Assistants and Nurse Practitioners) who all work together to provide you with the care you need, when you need it. ° °We recommend signing up for the patient portal called "MyChart".  Sign up information is provided on this After Visit Summary.  MyChart is used to connect with patients for Virtual Visits (Telemedicine).  Patients are able to view lab/test results, encounter notes, upcoming appointments, etc.  Non-urgent messages can be sent to your provider as well.   °To learn more about what you can do with MyChart, go to [email or web address].   ° °Your next appointment:   °1 year(s) ° °The format for your next appointment:   °In Person ° °Provider:   °Peter Nishan, MD { ° °

## 2020-03-30 ENCOUNTER — Other Ambulatory Visit: Payer: Self-pay | Admitting: Internal Medicine

## 2020-04-08 LAB — HM DIABETES EYE EXAM

## 2020-04-16 ENCOUNTER — Other Ambulatory Visit: Payer: Self-pay | Admitting: Cardiovascular Disease

## 2020-04-26 ENCOUNTER — Other Ambulatory Visit: Payer: Self-pay

## 2020-04-26 ENCOUNTER — Ambulatory Visit (INDEPENDENT_AMBULATORY_CARE_PROVIDER_SITE_OTHER): Payer: Medicare Other | Admitting: Internal Medicine

## 2020-04-26 ENCOUNTER — Encounter: Payer: Self-pay | Admitting: Internal Medicine

## 2020-04-26 VITALS — BP 124/80 | HR 79 | Temp 97.9°F | Resp 18 | Ht 64.0 in | Wt 158.6 lb

## 2020-04-26 DIAGNOSIS — I10 Essential (primary) hypertension: Secondary | ICD-10-CM

## 2020-04-26 DIAGNOSIS — E1169 Type 2 diabetes mellitus with other specified complication: Secondary | ICD-10-CM

## 2020-04-26 DIAGNOSIS — E118 Type 2 diabetes mellitus with unspecified complications: Secondary | ICD-10-CM | POA: Diagnosis not present

## 2020-04-26 DIAGNOSIS — E785 Hyperlipidemia, unspecified: Secondary | ICD-10-CM

## 2020-04-26 LAB — POCT GLYCOSYLATED HEMOGLOBIN (HGB A1C): Hemoglobin A1C: 6.7 % — AB (ref 4.0–5.6)

## 2020-04-26 MED ORDER — SIMVASTATIN 80 MG PO TABS: 80.0000 mg | ORAL_TABLET | Freq: Every day | ORAL | 3 refills | Status: DC

## 2020-04-26 NOTE — Assessment & Plan Note (Signed)
BP is at goal on lisinopril 10 mg daily and we discussed that the ACE-I does help with renal protection and although we could likely stop for just BP he would benefit from staying on this and he is willing to do that. Continue lisinopril 10 mg daily.

## 2020-04-26 NOTE — Progress Notes (Signed)
   Subjective:   Patient ID: Clifford [name], male    DOB: 03/25/1945, 75 y.o.   MRN: [id number]  HPI The patient is a 75 YO man coming in for follow up of his diabetes (strated taking metformin 1000 mg BID after last visit and has made major changes in diet, is exercising some and more when the weather continues to improve, had diarrhea first week of this, feels like it is exuding from his pores, still having nausea and poor appetite with this, would like to cut back or stop if possible due to the dietary changes) and cholesterol (his pharmacy cannot get vytorin and he is wondering what we can do to help him get this, does not want to stop cholesterol medicine, past CABG and diabetes, last LDL at goal on vytorin and has been on this medication for years) and blood pressure (with diet and weight loss over the past few years feels BP is good, wants to know if he could stop lisinopril, denies side effects, denies chest pains or headaches).   Review of Systems  Constitutional: Negative.   HENT: Negative.   Eyes: Negative.   Respiratory: Negative for cough, chest tightness and shortness of breath.   Cardiovascular: Negative for chest pain, palpitations and leg swelling.  Gastrointestinal: Positive for nausea. Negative for abdominal distention, abdominal pain, constipation, diarrhea and vomiting.  Musculoskeletal: Negative.   Skin: Negative.   Neurological: Negative.   Psychiatric/Behavioral: Negative.     Objective:  Physical Exam Constitutional:      Appearance: He is well-developed and well-nourished.  HENT:     Head: Normocephalic and atraumatic.  Eyes:     Extraocular Movements: EOM normal.  Cardiovascular:     Rate and Rhythm: Normal rate and regular rhythm.  Pulmonary:     Effort: Pulmonary effort is normal. No respiratory distress.     Breath sounds: Normal breath sounds. No wheezing or rales.  Abdominal:     General: Bowel sounds are normal. There is no distension.      Palpations: Abdomen is soft.     Tenderness: There is no abdominal tenderness. There is no rebound.  Musculoskeletal:        General: No edema.     Cervical back: Normal range of motion.  Skin:    General: Skin is warm and dry.  Neurological:     Mental Status: He is alert and oriented to person, place, and time.     Coordination: Coordination normal.  Psychiatric:        Mood and Affect: Mood and affect normal.     Vitals:   04/26/20 0808  BP: 124/80  Pulse: 79  Resp: 18  Temp: 97.9 F (36.6 C)  TempSrc: Oral  SpO2: 96%  Weight: 158 lb 9.6 oz (71.9 kg)  Height: 5\' 4"  (1.626 m)    This visit occurred during the SARS-CoV-2 public health emergency.  Safety protocols were in place, including screening questions prior to the visit, additional usage of staff PPE, and extensive cleaning of exam room while observing appropriate contact time as indicated for disinfecting solutions.   Assessment & Plan:

## 2020-04-26 NOTE — Patient Instructions (Addendum)
We will have you stop taking the metformin and come back in about 3 months.   We have sent in simvastatin 80 mg daily to replace the vytorin and will recheck the cholesterol in about 3 months when you come back.

## 2020-04-26 NOTE — Assessment & Plan Note (Signed)
Will try simvastatin 80 mg daily which is not dose change but will stop the zetia as his pharmacy is unable to get vytorin. Repeat lipid panel in 3 months and if still at goal will keep on simvastatin alone.

## 2020-04-26 NOTE — Assessment & Plan Note (Signed)
Has gone from poor control to well controlled. He wishes to try stopping metformin and maintain the lifestyle changes. Ok with that and follow up in 3 months to reassess. POC HgA1c done today 6.7 at visit. He is on ACE-I and statin.

## 2020-07-27 ENCOUNTER — Encounter: Payer: Self-pay | Admitting: Internal Medicine

## 2020-07-27 ENCOUNTER — Other Ambulatory Visit: Payer: Self-pay

## 2020-07-27 ENCOUNTER — Ambulatory Visit (INDEPENDENT_AMBULATORY_CARE_PROVIDER_SITE_OTHER): Payer: Medicare Other | Admitting: Internal Medicine

## 2020-07-27 VITALS — BP 122/80 | HR 72 | Temp 98.4°F | Resp 18 | Ht 64.0 in | Wt 168.0 lb

## 2020-07-27 DIAGNOSIS — E1169 Type 2 diabetes mellitus with other specified complication: Secondary | ICD-10-CM

## 2020-07-27 DIAGNOSIS — E118 Type 2 diabetes mellitus with unspecified complications: Secondary | ICD-10-CM | POA: Diagnosis not present

## 2020-07-27 DIAGNOSIS — E785 Hyperlipidemia, unspecified: Secondary | ICD-10-CM

## 2020-07-27 DIAGNOSIS — Z86010 Personal history of colonic polyps: Secondary | ICD-10-CM

## 2020-07-27 LAB — POCT GLYCOSYLATED HEMOGLOBIN (HGB A1C): Hemoglobin A1C: 7.8 % — AB (ref 4.0–5.6)

## 2020-07-27 NOTE — Progress Notes (Signed)
   Subjective:   Patient ID: Clifford [name], male    DOB: Jul 15, 1945, 75 y.o.   MRN: [id number]  HPI The patient is a 75 YO man coming in for follow up cholesterol (switch from vytorin to simvastatin alone, stopped zetia to simplify and pharmacy could not get vytorin) and diabetes (made significant lifestyle changes, having side effects from metformin so we did stop that last visit, weight is up about 10 pounds, has still been keeping up the changes).   Review of Systems  Constitutional: Negative.   HENT: Negative.   Eyes: Negative.   Respiratory: Negative for cough, chest tightness and shortness of breath.   Cardiovascular: Negative for chest pain, palpitations and leg swelling.  Gastrointestinal: Negative for abdominal distention, abdominal pain, constipation, diarrhea, nausea and vomiting.  Musculoskeletal: Negative.   Skin: Negative.   Neurological: Negative.   Psychiatric/Behavioral: Negative.     Objective:  Physical Exam Constitutional:      Appearance: He is well-developed.  HENT:     Head: Normocephalic and atraumatic.  Cardiovascular:     Rate and Rhythm: Normal rate and regular rhythm.  Pulmonary:     Effort: Pulmonary effort is normal. No respiratory distress.     Breath sounds: Normal breath sounds. No wheezing or rales.  Abdominal:     General: Bowel sounds are normal. There is no distension.     Palpations: Abdomen is soft.     Tenderness: There is no abdominal tenderness. There is no rebound.  Musculoskeletal:     Cervical back: Normal range of motion.  Skin:    General: Skin is warm and dry.  Neurological:     Mental Status: He is alert and oriented to person, place, and time.     Coordination: Coordination normal.     Vitals:   07/27/20 0806  BP: 122/80  Pulse: 72  Resp: 18  Temp: 98.4 F (36.9 C)  TempSrc: Oral  SpO2: 97%  Weight: 168 lb (76.2 kg)  Height: 5\' 4"  (1.626 m)    This visit occurred during the SARS-CoV-2 public health emergency.   Safety protocols were in place, including screening questions prior to the visit, additional usage of staff PPE, and extensive cleaning of exam room while observing appropriate contact time as indicated for disinfecting solutions.   Assessment & Plan:

## 2020-07-27 NOTE — Assessment & Plan Note (Signed)
POC HgA1c done at 7.8 today which is at goal of <8. He will keep up with diet and exercise changes. Taking ACE-I and statin.

## 2020-07-27 NOTE — Assessment & Plan Note (Signed)
Reminded due for colonoscopy.

## 2020-07-27 NOTE — Assessment & Plan Note (Signed)
Checking lipid panel as he made change from vytorin to simvastatin alone due to lack of availability. If not doing as well we can either add zetia or resume vytorin. He does have CAD and DM.

## 2020-07-27 NOTE — Patient Instructions (Signed)
Your HgA1c is 7.8 today so keep up the good work with diet and exercise.

## 2020-07-28 ENCOUNTER — Encounter: Payer: Self-pay | Admitting: Internal Medicine

## 2020-08-12 ENCOUNTER — Encounter: Payer: Self-pay | Admitting: Internal Medicine

## 2020-10-27 ENCOUNTER — Encounter: Payer: Self-pay | Admitting: Internal Medicine

## 2020-10-27 ENCOUNTER — Other Ambulatory Visit: Payer: Self-pay

## 2020-10-27 ENCOUNTER — Ambulatory Visit (INDEPENDENT_AMBULATORY_CARE_PROVIDER_SITE_OTHER): Payer: Medicare Other | Admitting: Internal Medicine

## 2020-10-27 VITALS — BP 128/90 | HR 84 | Temp 98.1°F | Resp 18 | Ht 64.0 in | Wt 169.0 lb

## 2020-10-27 DIAGNOSIS — E118 Type 2 diabetes mellitus with unspecified complications: Secondary | ICD-10-CM | POA: Diagnosis not present

## 2020-10-27 LAB — POCT GLYCOSYLATED HEMOGLOBIN (HGB A1C): Hemoglobin A1C: 11.3 % — AB (ref 4.0–5.6)

## 2020-10-27 MED ORDER — GLIMEPIRIDE 2 MG PO TABS: 2.0000 mg | ORAL_TABLET | Freq: Every day | ORAL | 3 refills | Status: DC

## 2020-10-27 NOTE — Assessment & Plan Note (Signed)
Severe exacerbation today with HgA1c POC done up to 11.3 from 7.9 last time. We have decided to start amaryl 2 mg daily. He is having blurred vision, dizziness as a result of the high sugars. Advised to stay with food changes and go back to drinking only water or unsweetened beverages.

## 2020-10-27 NOTE — Progress Notes (Signed)
   Subjective:   Patient ID: Clifford [name], male    DOB: Oct 06, 1945, 75 y.o.   MRN: [id number]  HPI The patient is a 75 YO man coming in for follow up sugars. Is drinking more half and half tea instead of just water since last visit. Maybe some blurred vision.   Review of Systems  Constitutional: Negative.   HENT: Negative.    Eyes:  Positive for visual disturbance.  Respiratory:  Negative for cough, chest tightness and shortness of breath.   Cardiovascular:  Negative for chest pain, palpitations and leg swelling.  Gastrointestinal:  Negative for abdominal distention, abdominal pain, constipation, diarrhea, nausea and vomiting.  Musculoskeletal: Negative.   Skin: Negative.   Neurological:  Positive for dizziness.  Psychiatric/Behavioral: Negative.     Objective:  Physical Exam Constitutional:      Appearance: He is well-developed.  HENT:     Head: Normocephalic and atraumatic.  Cardiovascular:     Rate and Rhythm: Normal rate and regular rhythm.  Pulmonary:     Effort: Pulmonary effort is normal. No respiratory distress.     Breath sounds: Normal breath sounds. No wheezing or rales.  Abdominal:     General: Bowel sounds are normal. There is no distension.     Palpations: Abdomen is soft.     Tenderness: There is no abdominal tenderness. There is no rebound.  Musculoskeletal:     Cervical back: Normal range of motion.  Skin:    General: Skin is warm and dry.  Neurological:     Mental Status: He is alert and oriented to person, place, and time.     Coordination: Coordination normal.    Vitals:   10/27/20 0759  BP: 128/90  Pulse: 84  Resp: 18  Temp: 98.1 F (36.7 C)  TempSrc: Oral  SpO2: 95%  Weight: 169 lb (76.7 kg)  Height: '5\' 4"'$  (1.626 m)    This visit occurred during the SARS-CoV-2 public health emergency.  Safety protocols were in place, including screening questions prior to the visit, additional usage of staff PPE, and extensive cleaning of exam room while  observing appropriate contact time as indicated for disinfecting solutions.   Assessment & Plan:

## 2020-10-27 NOTE — Patient Instructions (Addendum)
The sugars are up so we will start you on amaryl (glimepiride) to help bring down the sugars. Take 1 pill daily with morning meal.

## 2020-11-09 ENCOUNTER — Ambulatory Visit (AMBULATORY_SURGERY_CENTER): Payer: Medicare Other | Admitting: *Deleted

## 2020-11-09 ENCOUNTER — Other Ambulatory Visit: Payer: Self-pay

## 2020-11-09 VITALS — Ht 64.0 in | Wt 169.0 lb

## 2020-11-09 DIAGNOSIS — Z86010 Personal history of colonic polyps: Secondary | ICD-10-CM

## 2020-11-09 NOTE — Progress Notes (Signed)

## 2020-11-10 ENCOUNTER — Encounter: Payer: Self-pay | Admitting: Internal Medicine

## 2020-11-17 ENCOUNTER — Telehealth: Payer: Self-pay | Admitting: Internal Medicine

## 2020-11-17 NOTE — Telephone Encounter (Signed)
Inbound call from patient requesting a call back. He have questions about procedure.

## 2020-11-17 NOTE — Telephone Encounter (Signed)
[phone] call cell per pt   Had PV 9-14-  add cataract surgery 10-2012  ? Does he have to be cathed for the procedure- instructed no, no cath   Pt needed clarification for liquids the day before  - he ? Does he have to drink 8-10 ounces every hour Tues-  yes if he can to stay hydrated   He clarified foods to hold the 5 days prior - informed he can at cooked veggies minus the foods listed to stop   Lelan Pons PV

## 2020-11-23 ENCOUNTER — Other Ambulatory Visit: Payer: Self-pay

## 2020-11-23 ENCOUNTER — Ambulatory Visit (AMBULATORY_SURGERY_CENTER): Payer: Medicare Other | Admitting: Internal Medicine

## 2020-11-23 ENCOUNTER — Encounter: Payer: Self-pay | Admitting: Internal Medicine

## 2020-11-23 VITALS — BP 125/63 | HR 85 | Temp 98.0°F | Resp 14 | Ht 64.0 in | Wt 169.0 lb

## 2020-11-23 DIAGNOSIS — Z86010 Personal history of colonic polyps: Secondary | ICD-10-CM

## 2020-11-23 DIAGNOSIS — D123 Benign neoplasm of transverse colon: Secondary | ICD-10-CM

## 2020-11-23 MED ORDER — SODIUM CHLORIDE 0.9 % IV SOLN: 500.0000 mL | Freq: Once | INTRAVENOUS | Status: DC

## 2020-11-23 NOTE — Patient Instructions (Addendum)
Only 1 tiny polyp today - removed. You do not need any more routine colonoscopy procedures.  I appreciate the opportunity to care for you. Gatha Mayer, MD, [name]  Read al of the handouts given to you by your recovery room nurse.  You will not need another colonoscopy.   YOU HAD AN ENDOSCOPIC PROCEDURE TODAY AT [facility]:   Refer to the procedure report that was given to you for any specific questions about what was found during the examination.  If the procedure report does not answer your questions, please call your gastroenterologist to clarify.  If you requested that your care partner not be given the details of your procedure findings, then the procedure report has been included in a sealed envelope for you to review at your convenience later.  YOU SHOULD EXPECT: Some feelings of bloating in the abdomen. Passage of more gas than usual.  Walking can help get rid of the air that was put into your GI tract during the procedure and reduce the bloating. If you had a lower endoscopy (such as a colonoscopy or flexible sigmoidoscopy) you may notice spotting of blood in your stool or on the toilet paper. If you underwent a bowel prep for your procedure, you may not have a normal bowel movement for a few days.  Please Note:  You might notice some irritation and congestion in your nose or some drainage.  This is from the oxygen used during your procedure.  There is no need for concern and it should clear up in a day or so.  SYMPTOMS TO REPORT IMMEDIATELY:  Following lower endoscopy (colonoscopy or flexible sigmoidoscopy):  Excessive amounts of blood in the stool  Significant tenderness or worsening of abdominal pains  Swelling of the abdomen that is new, acute  Fever of 100F or higher   For urgent or emergent issues, a gastroenterologist can be reached at any hour by calling [phone]. Do not use MyChart messaging for urgent concerns.    DIET:  We do recommend a small  meal at first, but then you may proceed to your regular diet.  Drink plenty of fluids but you should avoid alcoholic beverages for 24 hours.  ACTIVITY:  You should plan to take it easy for the rest of today and you should NOT DRIVE or use heavy machinery until tomorrow (because of the sedation medicines used during the test).    FOLLOW UP: Our staff will call the number listed on your records 48-72 hours following your procedure to check on you and address any questions or concerns that you may have regarding the information given to you following your procedure. If we do not reach you, we will leave a message.  We will attempt to reach you two times.  During this call, we will ask if you have developed any symptoms of COVID 19. If you develop any symptoms (ie: fever, flu-like symptoms, shortness of breath, cough etc.) before then, please call [phone].  If you test positive for Covid 19 in the 2 weeks post procedure, please call and report this information to [name].    If any biopsies were taken you will be contacted by phone or by letter within the next 1-3 weeks.  Please call us at [phone] if you have not heard about the biopsies in 3 weeks.    SIGNATURES/CONFIDENTIALITY: You and/or your care partner have signed paperwork which will be entered into your electronic medical record.  These signatures attest to  the fact that that the information above on your After Visit Summary has been reviewed and is understood.  Full responsibility of the confidentiality of this discharge information lies with you and/or your care-partner.

## 2020-11-23 NOTE — Op Note (Signed)
[place] Patient Name: Clifford [name] Procedure Date: 11/23/2020 7:35 AM MRN: [id number] Endoscopist: Gatha Mayer , MD Age: 75 Referring MD:  Date of Birth: [birth year] Gender: Male Account #: [id number] Procedure:                Colonoscopy Indications:              Surveillance: Personal history of adenomatous                            polyps on last colonoscopy 5 years ago Medicines:                Propofol per Anesthesia, Monitored Anesthesia Care Procedure:                Pre-Anesthesia Assessment:                           - Prior to the procedure, a History and Physical                            was performed, and patient medications and                            allergies were reviewed. The patient's tolerance of                            previous anesthesia was also reviewed. The risks                            and benefits of the procedure and the sedation                            options and risks were discussed with the patient.                            All questions were answered, and informed consent                            was obtained. Prior Anticoagulants: The patient has                            taken no previous anticoagulant or antiplatelet                            agents. ASA Grade Assessment: III - A patient with                            severe systemic disease. After reviewing the risks                            and benefits, the patient was deemed in                            satisfactory condition to undergo the procedure.  After obtaining informed consent, the colonoscope                            was passed under direct vision. Throughout the                            procedure, the patient's blood pressure, pulse, and                            oxygen saturations were monitored continuously. The                            CF [id number] #[id number] was introduced through the anus                             and advanced to the the cecum, identified by                            appendiceal orifice and ileocecal valve. The                            colonoscopy was performed without difficulty. The                            patient tolerated the procedure well. The quality                            of the bowel preparation was good. The ileocecal                            valve, appendiceal orifice, and rectum were                            photographed. The bowel preparation used was                            Miralax via split dose instruction. 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                           A 1 mm polyp was found in the transverse colon. The                            polyp was sessile. The polyp was removed with a                            cold biopsy forceps. Resection and retrieval were  complete. Verification of patient identification                            for the specimen was done. Estimated blood loss was                            minimal.                           Anal papilla(e) were hypertrophied.                           The exam was otherwise without abnormality on                            direct and retroflexion views. Complications:            No immediate complications. Estimated Blood Loss:     Estimated blood loss was minimal. 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                           - One 1 mm polyp in the transverse colon, removed                            with a cold biopsy forceps. Resected and retrieved.                           - Anal papilla(e) were hypertrophied.                           -  The examination was otherwise normal on direct                            and retroflexion views.                           - Personal history of colonic polyps.                           -10/2011 - 4 adenomas max 12 mm                           04/12/2015 2 polyps 2 mm ascending and 7 mm sigmoid                            - adenomas Recommendation:           - Patient has a contact number available for                            emergencies. The signs and symptoms of potential  delayed complications were discussed with the                            patient. Return to normal activities tomorrow.                            Written discharge instructions were provided to the                            patient.                           - Resume previous diet.                           - Continue present medications.                           - Await pathology results.                           - No repeat colonoscopy due to age. Gatha Mayer, MD 11/23/2020 9:34:11 AM This report has been signed electronically.

## 2020-11-23 NOTE — Progress Notes (Signed)
[facility] History and Physical   Primary Care Physician:  Hoyt Koch, MD   Reason for Procedure:   Hx colon polyps  Plan:    colonoscopy     HPI: Clifford [name] is a 75 y.o. male here for surveillance colonoscopy. No problems otherwise.   Past Medical History:  Diagnosis Date   CAD (coronary artery disease) 02/27/2003   a. Previous stents RCA and circ, Cath 2005 patient with 70% PDA  //  b. [place] 5/17 >> LHC oLCx 50, pLAD 50, mLAD 95, RPDA 80, normal LVEF >> s/p CABG   Cancer [facility])    Carotid artery disease ([place])    a. Carotid US 5/17 bilat ICA 1-39%   Cataract    replace 11/2012   Diabetes mellitus    diet controlled   History of detached retina repair 11/06/2006   [facility]   History of echocardiogram    a. EF 55% to 60%. Wall motion was normal, Gr 1 DD, Trivial MR/TR   History of hematuria    History of prostate cancer 03/29/2005   s/p prostatectomy   Hyperlipidemia    Hypertension     Past Surgical History:  Procedure Laterality Date   ANGIOPLASTY     1995,2000,2002   CARDIAC CATHETERIZATION  07/26/2000   Initially the stenosis in the proximal to mid right coronary artery was estimated ar 95%. Following stenting, this improved to 0%. There was residual mild disease in the sostium of the proximal right coronary atrtery. Successful stentng of the proximal to mid right coronary artery with improvement in stent diameter narrowing from 95% to 0% Proc Date  07/29/2000   CARDIAC CATHETERIZATION N/A 07/04/2015   Procedure: Left Heart Cath and Coronary Angiography;  Surgeon: Jettie Booze, MD;  Location: [place] [facility];  Service: Cardiovascular;  Laterality: N/A;   CATARACT EXTRACTION W/ INTRAOCULAR LENS IMPLANT Bilateral OS Sept '14; OD Oct '14   Dr. Bing Plume   COLONOSCOPY  04/12/2015   Dr.Jeneen Doutt   CORONARY ARTERY BYPASS GRAFT N/A 07/08/2015   Procedure: CORONARY ARTERY BYPASS GRAFTING (CABG)x three using left internal mammary artery  to left anterior decending artery, left greater saphenous vein grafts to diagonal and posterolateral. Left leg greater saphenous leg vein via endoscope. ;  Surgeon: [name], MD;  Location: [place];  Service: Open Heart Surgery;  Laterality: N/A;   CYSTOSCOPY N/A 07/08/2015   Procedure: CYSTOSCOPY FLEXIBLE WITH BALLOON DILATION OF BLADDER NECK CONTRACTURE WITH DIFFICULT CATHETER PLACEMENT;  Surgeon: Raynelle Bring, MD;  Location: [place];  Service: Urology;  Laterality: N/A;   EYE SURGERY  11/24/2012   left eye cataract   EYE SURGERY  12/22/2012   right eye cataract   HEMORRHOID SURGERY     I&D of thrombosed hemorrhoid   LIPOMA EXCISION     POLYPECTOMY     PROSTATECTOMY  03/29/2005   PTCA  02/27/2003   TEE WITHOUT CARDIOVERSION N/A 07/08/2015   Procedure: TRANSESOPHAGEAL ECHOCARDIOGRAM (TEE);  Surgeon: [name], MD;  Location: [place];  Service: Open Heart Surgery;  Laterality: N/A;   WRIST GANGLION EXCISION      Prior to Admission medications   Medication Sig Start Date End Date Taking? Authorizing Provider  Ascorbic Acid (VITAMIN C PO) Take 1 tablet by mouth daily.   Yes [provider]  aspirin EC 81 MG tablet Take 81 mg by mouth once.    Yes [provider]  blood glucose meter kit and supplies Dispense based  on patient and insurance preference. Use up to four times daily as directed. (FOR ICD-9 250.00, 250.01). 07/14/15  Yes [name], [name] R, PA-C  glimepiride (AMARYL) 2 MG tablet Take 1 tablet (2 mg total) by mouth daily before breakfast. 10/27/20  Yes Hoyt Koch, MD  lisinopril (ZESTRIL) 10 MG tablet TAKE ONE TABLET BY MOUTH ONE TIME DAILY 04/18/20  Yes Josue Hector, MD  Multiple Vitamin (MULTIVITAMIN) tablet Take 1 tablet by mouth daily.   Yes [provider]  ONE TOUCH ULTRA TEST test strip 1 each by Other route 2 (two) times daily. Use to check blood sugars twice a day Dx E11.9 09/12/15  Yes Hoyt Koch, MD  [facility] DELICA  LANCETS 47B MISC Use to help check blood sugars twice a day Dx e11.9 09/12/15  Yes Hoyt Koch, MD  simvastatin (ZOCOR) 80 MG tablet Take 1 tablet (80 mg total) by mouth daily. 04/26/20  Yes Hoyt Koch, MD    Current Outpatient Medications  Medication Sig Dispense Refill   Ascorbic Acid (VITAMIN C PO) Take 1 tablet by mouth daily.     aspirin EC 81 MG tablet Take 81 mg by mouth once.      blood glucose meter kit and supplies Dispense based on patient and insurance preference. Use up to four times daily as directed. (FOR ICD-9 250.00, 250.01). 1 each 0   glimepiride (AMARYL) 2 MG tablet Take 1 tablet (2 mg total) by mouth daily before breakfast. 90 tablet 3   lisinopril (ZESTRIL) 10 MG tablet TAKE ONE TABLET BY MOUTH ONE TIME DAILY 90 tablet 3   Multiple Vitamin (MULTIVITAMIN) tablet Take 1 tablet by mouth daily.     ONE TOUCH ULTRA TEST test strip 1 each by Other route 2 (two) times daily. Use to check blood sugars twice a day Dx E11.9 100 each 5   ONETOUCH DELICA LANCETS 40Z MISC Use to help check blood sugars twice a day Dx e11.9 100 each 5   simvastatin (ZOCOR) 80 MG tablet Take 1 tablet (80 mg total) by mouth daily. 90 tablet 3   Current Facility-Administered Medications  Medication Dose Route Frequency Provider Last Rate Last Admin   0.9 %  sodium chloride infusion  500 mL Intravenous Once Gatha Mayer, MD        Allergies as of 11/23/2020 - Review Complete 11/23/2020  Allergen Reaction Noted   Cinnamon Other (See Comments) 03/29/2015    Family History  Problem Relation Age of Onset   CAD Father    Diabetes Sister    Colon polyps Sister    Cancer Neg Hx        Colon   Colon cancer Neg Hx    Esophageal cancer Neg Hx    Rectal cancer Neg Hx    Stomach cancer Neg Hx     Social History   Socioeconomic History   Marital status: Married                   Occupational History   Not on file  Tobacco Use   Smoking status: Former    Types:  Cigarettes    Quit date: 02/27/1983    Years since quitting: 37.7   Smokeless tobacco: Never  Vaping Use   Vaping Use: Never used  Substance and Sexual Activity   Alcohol use: No    Alcohol/week: 0.0 standard drinks   Drug use: No   Sexual activity: Yes    Partners: Female  Other Topics Concern   Not on file  Social History Narrative   Programmer, systems, 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 ACP - discussed with patient and provided packet and referral to [email or web address] Nov '15. He already has stated that if he is :"comatose" pull the plug.       Review of Systems:  All other review of systems negative except as mentioned in the HPI.  Physical Exam: Vital signs BP (!) 171/90   Pulse 81   Temp 98 F (36.7 C) (Temporal)   Resp 12   Ht '5\' 4"'  (1.626 m)   Wt 169 lb (76.7 kg)   SpO2 98%   BMI 29.01 kg/m   General:   Alert,  Well-developed, well-nourished, pleasant and cooperative in NAD Lungs:  Clear throughout to auscultation.   Heart:  Regular rate and rhythm; no murmurs, clicks, rubs,  or gallops. Abdomen:  Soft, nontender and nondistended. Normal bowel sounds.   Neuro/Psych:  Alert and cooperative. Normal mood and affect. A and O x 3   '@Verble Styron'  Simonne Maffucci, MD, [facility] [phone] (pager) 11/23/2020 9:02 AM@

## 2020-11-23 NOTE — Progress Notes (Signed)
VS completed by NS.  Patient did not take BP medications this morning.   Pt's states no medical or surgical changes since previsit or office visit.

## 2020-11-23 NOTE — Progress Notes (Signed)
Called to room to assist during endoscopic procedure.  Patient ID and intended procedure confirmed with present staff. Received instructions for my participation in the procedure from the performing physician.  

## 2020-11-23 NOTE — Progress Notes (Signed)
PT taken to PACU. Monitors in place. VSS. Report given to RN. 

## 2020-11-25 ENCOUNTER — Telehealth: Payer: Self-pay | Admitting: *Deleted

## 2020-11-25 ENCOUNTER — Telehealth: Payer: Self-pay

## 2020-11-25 NOTE — Telephone Encounter (Signed)
  Follow up Call-  Call back number 11/23/2020  Post procedure Call Back phone  # [phone]  Permission to leave phone message Yes  Some recent data might be hidden     Patient questions: Message left to call us if necessary.

## 2020-11-25 NOTE — Telephone Encounter (Signed)
  Follow up Call-  Call back number 11/23/2020  Post procedure Call Back phone  # [phone]  Permission to leave phone message Yes  Some recent data might be hidden     Patient questions:  Do you have a fever, pain , or abdominal swelling? No. Pain Score  0 *  Have you tolerated food without any problems? Yes.    Have you been able to return to your normal activities? Yes.    Do you have any questions about your discharge instructions: Diet   No. Medications  No. Follow up visit  No.  Do you have questions or concerns about your Care? No.  Actions: * If pain score is 4 or above: No action needed, pain <4.

## 2020-12-01 ENCOUNTER — Encounter: Payer: Self-pay | Admitting: Internal Medicine

## 2020-12-20 ENCOUNTER — Emergency Department (HOSPITAL_BASED_OUTPATIENT_CLINIC_OR_DEPARTMENT_OTHER)
Admission: EM | Admit: 2020-12-20 | Discharge: 2020-12-20 | Disposition: A | Discharge status: Home / Self Care | Payer: Medicare Other | Attending: Emergency Medicine | Admitting: Emergency Medicine

## 2020-12-20 ENCOUNTER — Encounter (HOSPITAL_BASED_OUTPATIENT_CLINIC_OR_DEPARTMENT_OTHER): Payer: Self-pay

## 2020-12-20 ENCOUNTER — Other Ambulatory Visit: Payer: Self-pay

## 2020-12-20 ENCOUNTER — Emergency Department (HOSPITAL_BASED_OUTPATIENT_CLINIC_OR_DEPARTMENT_OTHER): Payer: Medicare Other

## 2020-12-20 DIAGNOSIS — Z7984 Long term (current) use of oral hypoglycemic drugs: Secondary | ICD-10-CM | POA: Diagnosis not present

## 2020-12-20 DIAGNOSIS — I2510 Atherosclerotic heart disease of native coronary artery without angina pectoris: Secondary | ICD-10-CM | POA: Insufficient documentation

## 2020-12-20 DIAGNOSIS — I129 Hypertensive chronic kidney disease with stage 1 through stage 4 chronic kidney disease, or unspecified chronic kidney disease: Secondary | ICD-10-CM | POA: Diagnosis not present

## 2020-12-20 DIAGNOSIS — W19XXXA Unspecified fall, initial encounter: Secondary | ICD-10-CM | POA: Insufficient documentation

## 2020-12-20 DIAGNOSIS — S60519A Abrasion of unspecified hand, initial encounter: Secondary | ICD-10-CM | POA: Insufficient documentation

## 2020-12-20 DIAGNOSIS — Z859 Personal history of malignant neoplasm, unspecified: Secondary | ICD-10-CM | POA: Diagnosis not present

## 2020-12-20 DIAGNOSIS — E1122 Type 2 diabetes mellitus with diabetic chronic kidney disease: Secondary | ICD-10-CM | POA: Insufficient documentation

## 2020-12-20 DIAGNOSIS — Z79899 Other long term (current) drug therapy: Secondary | ICD-10-CM | POA: Diagnosis not present

## 2020-12-20 DIAGNOSIS — R102 Pelvic and perineal pain: Secondary | ICD-10-CM | POA: Diagnosis not present

## 2020-12-20 DIAGNOSIS — N183 Chronic kidney disease, stage 3 unspecified: Secondary | ICD-10-CM | POA: Insufficient documentation

## 2020-12-20 DIAGNOSIS — M549 Dorsalgia, unspecified: Secondary | ICD-10-CM | POA: Insufficient documentation

## 2020-12-20 DIAGNOSIS — S6990XA Unspecified injury of unspecified wrist, hand and finger(s), initial encounter: Secondary | ICD-10-CM | POA: Diagnosis present

## 2020-12-20 DIAGNOSIS — Z87891 Personal history of nicotine dependence: Secondary | ICD-10-CM | POA: Diagnosis not present

## 2020-12-20 DIAGNOSIS — Z23 Encounter for immunization: Secondary | ICD-10-CM | POA: Insufficient documentation

## 2020-12-20 DIAGNOSIS — Z951 Presence of aortocoronary bypass graft: Secondary | ICD-10-CM | POA: Insufficient documentation

## 2020-12-20 DIAGNOSIS — Z7982 Long term (current) use of aspirin: Secondary | ICD-10-CM | POA: Diagnosis not present

## 2020-12-20 DIAGNOSIS — Z8546 Personal history of malignant neoplasm of prostate: Secondary | ICD-10-CM | POA: Diagnosis not present

## 2020-12-20 DIAGNOSIS — S32020A Wedge compression fracture of second lumbar vertebra, initial encounter for closed fracture: Secondary | ICD-10-CM

## 2020-12-20 MED ORDER — TETANUS-DIPHTH-ACELL PERTUSSIS 5-2.5-18.5 LF-MCG/0.5 IM SUSY
0.5000 mL | PREFILLED_SYRINGE | Freq: Once | INTRAMUSCULAR | Status: AC
  Administered 2020-12-20: 0.5 mL via INTRAMUSCULAR
  Filled 2020-12-20: qty 0.5

## 2020-12-20 MED ORDER — LIDOCAINE 5 % EX PTCH: 1.0000 | MEDICATED_PATCH | CUTANEOUS | 0 refills | Status: DC

## 2020-12-20 NOTE — ED Triage Notes (Signed)
Pt states he fell last week c.o pain to "small of my  back and my pelvic girdle"-pt also c/o constipation and feels he may need a tdap-NAD-slow steady gait

## 2020-12-20 NOTE — Discharge Instructions (Signed)
You have a small fracture of L2.  I recommend taking Advil as needed and use lidocaine patch  You can talk to your doctor but usually this gets treated with physical therapy  Return to ER if you have worse back pain, numbness, weakness, trouble walking

## 2020-12-20 NOTE — ED Provider Notes (Signed)
[place] EMERGENCY DEPARTMENT Provider Note   CSN: [id number] Arrival date & time: 12/20/20  1244     History Chief Complaint  Patient presents with   Clifford [name]    Clifford [name] is a 75 y.o. male history of CAD, DM, here presenting with fall.  Patient states that about a week ago, he fell and landed on his buttock.  He states that he was in the yard at that time and tried to catch the dog and fell.  Patient has back pain and pelvic pain since then.  Patient also had abrasion of his hand and requesting tetanus shot. Has been constipated last week as well.  Has been taking Motrin with good relief.  The history is provided by the patient.      Past Medical History:  Diagnosis Date   CAD (coronary artery disease) 02/27/2003   a. Previous stents RCA and circ, Cath 2005 patient with 70% PDA  //  b. [place] 5/17 >> LHC oLCx 50, pLAD 50, mLAD 95, RPDA 80, normal LVEF >> s/p CABG   Cancer [facility])    Carotid artery disease ([place])    a. Carotid US 5/17 bilat ICA 1-39%   Cataract    replace 11/2012   Diabetes mellitus    diet controlled   History of detached retina repair 11/06/2006   [facility]   History of echocardiogram    a. EF 55% to 60%. Wall motion was normal, Gr 1 DD, Trivial MR/TR   History of hematuria    History of prostate cancer 03/29/2005   s/p prostatectomy   Hyperlipidemia    Hypertension     Patient Active Problem List   Diagnosis Date Noted   CKD (chronic kidney disease) stage 3, GFR 30-59 ml/min ([place]) 08/01/2015   Carotid artery disease ([place])    Hyperlipidemia associated with type 2 diabetes mellitus ([place]) 07/03/2015   Hypertensive heart disease 07/03/2015   Routine health maintenance 12/11/2010   Diabetes mellitus type 2 with complications ([place]) 28/31/5176   Essential hypertension 03/07/2007   Coronary atherosclerosis 03/07/2007   PROSTATE CANCER, HX OF 03/07/2007    Past Surgical History:  Procedure Laterality Date   ANGIOPLASTY      1995,2000,2002   CARDIAC CATHETERIZATION  07/26/2000   Initially the stenosis in the proximal to mid right coronary artery was estimated ar 95%. Following stenting, this improved to 0%. There was residual mild disease in the sostium of the proximal right coronary atrtery. Successful stentng of the proximal to mid right coronary artery with improvement in stent diameter narrowing from 95% to 0% Proc Date  07/29/2000   CARDIAC CATHETERIZATION N/A 07/04/2015   Procedure: Left Heart Cath and Coronary Angiography;  Surgeon: Jettie Booze, MD;  Location: [place] [facility];  Service: Cardiovascular;  Laterality: N/A;   CATARACT EXTRACTION W/ INTRAOCULAR LENS IMPLANT Bilateral OS Sept '14; OD Oct '14   Dr. Bing Plume   COLONOSCOPY  04/12/2015   Dr.Gessner   CORONARY ARTERY BYPASS GRAFT N/A 07/08/2015   Procedure: CORONARY ARTERY BYPASS GRAFTING (CABG)x three using left internal mammary artery to left anterior decending artery, left greater saphenous vein grafts to diagonal and posterolateral. Left leg greater saphenous leg vein via endoscope. ;  Surgeon: [name], MD;  Location: [place];  Service: Open Heart Surgery;  Laterality: N/A;   CYSTOSCOPY N/A 07/08/2015   Procedure: CYSTOSCOPY FLEXIBLE WITH BALLOON DILATION OF BLADDER NECK CONTRACTURE WITH DIFFICULT CATHETER PLACEMENT;  Surgeon: Raynelle Bring, MD;  Location:  [place] [facility];  Service: Urology;  Laterality: N/A;   EYE SURGERY  11/24/2012   left eye cataract   EYE SURGERY  12/22/2012   right eye cataract   HEMORRHOID SURGERY     I&D of thrombosed hemorrhoid   LIPOMA EXCISION     POLYPECTOMY     PROSTATECTOMY  03/29/2005   PTCA  02/27/2003   TEE WITHOUT CARDIOVERSION N/A 07/08/2015   Procedure: TRANSESOPHAGEAL ECHOCARDIOGRAM (TEE);  Surgeon: [name], MD;  Location: [place];  Service: Open Heart Surgery;  Laterality: N/A;   WRIST GANGLION EXCISION         Family History  Problem Relation Age of Onset   CAD Father    Diabetes  Sister    Colon polyps Sister    Cancer Neg Hx        Colon   Colon cancer Neg Hx    Esophageal cancer Neg Hx    Rectal cancer Neg Hx    Stomach cancer Neg Hx     Social History   Tobacco Use   Smoking status: Former    Types: Cigarettes    Quit date: 02/27/1983    Years since quitting: 37.8   Smokeless tobacco: Never  Vaping Use   Vaping Use: Never used  Substance Use Topics   Alcohol use: No    Alcohol/week: 0.0 standard drinks   Drug use: No    Home Medications Prior to Admission medications   Medication Sig Start Date End Date Taking? Authorizing Provider  Ascorbic Acid (VITAMIN C PO) Take 1 tablet by mouth daily.    [provider]  aspirin EC 81 MG tablet Take 81 mg by mouth once.     [provider]  blood glucose meter kit and supplies Dispense based on patient and insurance preference. Use up to four times daily as directed. (FOR ICD-9 250.00, 250.01). 07/14/15   [name], [name] R, PA-C  glimepiride (AMARYL) 2 MG tablet Take 1 tablet (2 mg total) by mouth daily before breakfast. 10/27/20   Hoyt Koch, MD  lisinopril (ZESTRIL) 10 MG tablet TAKE ONE TABLET BY MOUTH ONE TIME DAILY 04/18/20   Josue Hector, MD  Multiple Vitamin (MULTIVITAMIN) tablet Take 1 tablet by mouth daily.    [provider]  ONE TOUCH ULTRA TEST test strip 1 each by Other route 2 (two) times daily. Use to check blood sugars twice a day Dx E11.9 09/12/15   Hoyt Koch, MD  [facility] DELICA LANCETS 73X MISC Use to help check blood sugars twice a day Dx e11.9 09/12/15   Hoyt Koch, MD  simvastatin (ZOCOR) 80 MG tablet Take 1 tablet (80 mg total) by mouth daily. 04/26/20   Hoyt Koch, MD    Allergies    Cinnamon  Review of Systems   Review of Systems  Gastrointestinal:  Positive for constipation.  Musculoskeletal:  Positive for back pain.  All other systems reviewed and are negative.  Physical Exam Updated Vital Signs BP (!) 159/89    Pulse 86   Temp 98.5 F (36.9 C) (Oral)   Resp 17   Ht $R'5\' 4"'CJ$  (1.626 m)   Wt 77.1 kg   SpO2 97%   BMI 29.18 kg/m   Physical Exam Vitals and nursing note reviewed.  Constitutional:      Appearance: Normal appearance.  HENT:     Head: Normocephalic.     Nose: Nose normal.     Mouth/Throat:     Mouth:  Mucous membranes are moist.  Eyes:     Extraocular Movements: Extraocular movements intact.     Pupils: Pupils are equal, round, and reactive to light.  Cardiovascular:     Rate and Rhythm: Normal rate.     Pulses: Normal pulses.     Heart sounds: Normal heart sounds.  Pulmonary:     Effort: Pulmonary effort is normal.     Breath sounds: Normal breath sounds.  Abdominal:     General: Abdomen is flat.     Palpations: Abdomen is soft.  Genitourinary:    Comments: Rectal- no impaction, soft brown stool  Musculoskeletal:     Cervical back: Normal range of motion.     Comments: Mild lower sacral tenderness.  There is some SI joint tenderness as well.  Patient normal range of motion of bilateral hips.  Patient able to ambulate  Neurological:     General: No focal deficit present.     Mental Status: He is alert and oriented to person, place, and time.     Cranial Nerves: No cranial nerve deficit.     Sensory: No sensory deficit.     Motor: No weakness.     Coordination: Coordination normal.  Psychiatric:        Mood and Affect: Mood normal.        Behavior: Behavior normal.    ED Results / Procedures / Treatments   Labs (all labs ordered are listed, but only abnormal results are displayed) Labs Reviewed - No data to display  EKG None  Radiology DG Lumbar Spine Complete  Result Date: 12/20/2020 CLINICAL DATA:  Back pain history of fall EXAM: LUMBAR SPINE - COMPLETE 4+ VIEW COMPARISON:  None. FINDINGS: Lumbar alignment within normal limits. Moderate age indeterminate superior compression deformity at L2 with about 40% loss of vertebral body height. Remaining vertebra  demonstrate normal stature. Facet degenerative changes of the lower lumbar spine. Aortic atherosclerosis. IMPRESSION: Moderate age indeterminate superior endplate fracture at L2. Electronically Signed   By: Donavan Foil M.D.   On: 12/20/2020 16:25   DG Pelvis 1-2 Views  Result Date: 12/20/2020 CLINICAL DATA:  [place] last week. Pain at the small of the back and pelvic girdle. EXAM: PELVIS - 1-2 VIEW COMPARISON:  None. FINDINGS: There is no evidence of pelvic fracture or diastasis. No pelvic bone lesions are seen. Scattered air is seen within the small bowel and colon in the visualized mid to lower abdomen with a nonobstructive bowel gas pattern. Multiple pelvic phleboliths are noted. IMPRESSION: Negative. Electronically Signed   By: Ileana Roup M.D.   On: 12/20/2020 16:20    Procedures Procedures   Medications Ordered in ED Medications  Tdap (BOOSTRIX) injection 0.5 mL (0.5 mLs Intramuscular Given 12/20/20 1600)    ED Course  I have reviewed the triage vital signs and the nursing notes.  Pertinent labs & imaging results that were available during my care of the patient were reviewed by me and considered in my medical decision making (see chart for details).    MDM Rules/Calculators/A&P                           Clifford [name] is a 75 y.o. male here presenting with back pain after fall.  Patient fell about a week ago.  Patient is neurovascular intact currently. Will get xrays. Likely muscle strain.   4:36 PM Xrays showed L2 end plate fracture.  Patient is ambulatory.  Offered  pain medicine and muscle relaxant but patient refused.  We will try lidocaine patch.  Patient can get physical therapy with his doctor  Final Clinical Impression(s) / ED Diagnoses Final diagnoses:  None    Rx / DC Orders ED Discharge Orders     None        Drenda Freeze, MD 12/20/20 [phone]

## 2020-12-26 ENCOUNTER — Telehealth: Payer: Self-pay | Admitting: Internal Medicine

## 2020-12-26 NOTE — Telephone Encounter (Signed)
Patient states he has not had a bowel movement or appetite in x2w  Patient is requesting a call back to discuss food/ med options prior to his appt 12-29-2020

## 2020-12-27 NOTE — Telephone Encounter (Signed)
Called pt. Clifford [name] asking to give our office a call back to discuss. Office number was provided.

## 2020-12-29 ENCOUNTER — Ambulatory Visit (INDEPENDENT_AMBULATORY_CARE_PROVIDER_SITE_OTHER): Payer: Medicare Other | Admitting: Internal Medicine

## 2020-12-29 ENCOUNTER — Other Ambulatory Visit: Payer: Self-pay

## 2020-12-29 ENCOUNTER — Encounter: Payer: Self-pay | Admitting: Internal Medicine

## 2020-12-29 DIAGNOSIS — K5900 Constipation, unspecified: Secondary | ICD-10-CM | POA: Diagnosis not present

## 2020-12-29 NOTE — Progress Notes (Signed)
   Subjective:   Patient ID: Clifford [name], male    DOB: 1945-11-11, 75 y.o.   MRN: [id number]  HPI The patient is a 75 YO man coming in for problems with bowels. Still passing gas, appetite reduction mild nausea.  Review of Systems  Constitutional: Negative.   HENT: Negative.    Eyes: Negative.   Respiratory:  Negative for cough, chest tightness and shortness of breath.   Cardiovascular:  Negative for chest pain, palpitations and leg swelling.  Gastrointestinal:  Positive for constipation. Negative for abdominal distention, abdominal pain, diarrhea, nausea and vomiting.  Musculoskeletal: Negative.   Skin: Negative.   Neurological: Negative.   Psychiatric/Behavioral: Negative.     Objective:  Physical Exam Constitutional:      Appearance: He is well-developed.  HENT:     Head: Normocephalic and atraumatic.  Cardiovascular:     Rate and Rhythm: Normal rate and regular rhythm.  Pulmonary:     Effort: Pulmonary effort is normal. No respiratory distress.     Breath sounds: Normal breath sounds. No wheezing or rales.  Abdominal:     General: Bowel sounds are normal. There is no distension.     Palpations: Abdomen is soft.     Tenderness: There is no abdominal tenderness. There is no rebound.  Musculoskeletal:     Cervical back: Normal range of motion.  Skin:    General: Skin is warm and dry.  Neurological:     Mental Status: He is alert and oriented to person, place, and time.     Coordination: Coordination normal.    Vitals:   12/29/20 0826  BP: 120/68  Pulse: 61  Resp: 18  SpO2: 96%  Weight: 165 lb (74.8 kg)  Height: 5\' 4"  (1.626 m)    This visit occurred during the SARS-CoV-2 public health emergency.  Safety protocols were in place, including screening questions prior to the visit, additional usage of staff PPE, and extensive cleaning of exam room while observing appropriate contact time as indicated for disinfecting solutions.   Assessment & Plan:  Visit time  25 minutes in face to face communication with patient and coordination of care, additional 5 minutes spent in record review, coordination or care, ordering tests, communicating/referring to other healthcare professionals, documenting in medical records all on the same day of the visit for total time 30 minutes spent on the visit.

## 2020-12-29 NOTE — Patient Instructions (Signed)
We will have you take miralax to help you go to the bathroom do a dose an hour until you start going to the bathroom.   You can also use a suppository to help you go to the bathroom.

## 2020-12-30 ENCOUNTER — Encounter: Payer: Self-pay | Admitting: Internal Medicine

## 2020-12-30 DIAGNOSIS — K5900 Constipation, unspecified: Secondary | ICD-10-CM | POA: Insufficient documentation

## 2020-12-30 NOTE — Assessment & Plan Note (Signed)
New problem and unclear etiology. Seems like a change since colonoscopy. He has magnesium citrate at home advised to take and then if no results miralax every hour until BM and then BID for an additional 5 days. Stop or reduce for loose BM. Can use suppository if hard stool. Still passing gas and normal BS no concern for bowel obstruction. Advised of warning signs of this.

## 2021-02-01 ENCOUNTER — Encounter: Payer: Self-pay | Admitting: Internal Medicine

## 2021-02-01 ENCOUNTER — Other Ambulatory Visit: Payer: Self-pay

## 2021-02-01 ENCOUNTER — Ambulatory Visit (INDEPENDENT_AMBULATORY_CARE_PROVIDER_SITE_OTHER): Payer: Medicare Other | Admitting: Internal Medicine

## 2021-02-01 VITALS — BP 122/90 | HR 76 | Resp 18 | Ht 64.0 in | Wt 168.4 lb

## 2021-02-01 DIAGNOSIS — E785 Hyperlipidemia, unspecified: Secondary | ICD-10-CM

## 2021-02-01 DIAGNOSIS — I779 Disorder of arteries and arterioles, unspecified: Secondary | ICD-10-CM

## 2021-02-01 DIAGNOSIS — Z23 Encounter for immunization: Secondary | ICD-10-CM | POA: Diagnosis not present

## 2021-02-01 DIAGNOSIS — E118 Type 2 diabetes mellitus with unspecified complications: Secondary | ICD-10-CM | POA: Diagnosis not present

## 2021-02-01 DIAGNOSIS — I10 Essential (primary) hypertension: Secondary | ICD-10-CM

## 2021-02-01 DIAGNOSIS — Z0000 Encounter for general adult medical examination without abnormal findings: Secondary | ICD-10-CM | POA: Diagnosis not present

## 2021-02-01 DIAGNOSIS — E1169 Type 2 diabetes mellitus with other specified complication: Secondary | ICD-10-CM

## 2021-02-01 DIAGNOSIS — N1831 Chronic kidney disease, stage 3a: Secondary | ICD-10-CM | POA: Diagnosis not present

## 2021-02-01 LAB — COMPREHENSIVE METABOLIC PANEL
ALT: 19 U/L (ref 0–53)
AST: 20 U/L (ref 0–37)
Albumin: 4.4 g/dL (ref 3.5–5.2)
Alkaline Phosphatase: 67 U/L (ref 39–117)
BUN: 11 mg/dL (ref 6–23)
CO2: 32 mEq/L (ref 19–32)
Calcium: 10.2 mg/dL (ref 8.4–10.5)
Chloride: 102 mEq/L (ref 96–112)
Creatinine, Ser: 1.14 mg/dL (ref 0.40–1.50)
GFR: 61.98 mL/min (ref >60.00)
Glucose, Bld: 135 mg/dL — ABNORMAL HIGH (ref 70–99)
Potassium: 4.3 mEq/L (ref 3.5–5.1)
Sodium: 139 mEq/L (ref 135–145)
Total Bilirubin: 1.2 mg/dL (ref 0.2–1.2)
Total Protein: 7.4 g/dL (ref 6.0–8.3)

## 2021-02-01 LAB — MICROALBUMIN / CREATININE URINE RATIO
Creatinine,U: 112.7 mg/dL
Microalb Creat Ratio: 2.6 mg/g (ref 0.0–30.0)
Microalb, Ur: 2.9 mg/dL — ABNORMAL HIGH (ref 0.0–1.9)

## 2021-02-01 LAB — LIPID PANEL
Cholesterol: 168 mg/dL (ref 0–200)
HDL: 45.6 mg/dL (ref >39.00)
LDL Cholesterol: 91 mg/dL (ref 0–99)
NonHDL: 122.78
Total CHOL/HDL Ratio: 4
Triglycerides: 158 mg/dL — ABNORMAL HIGH (ref 0.0–149.0)
VLDL: 31.6 mg/dL (ref 0.0–40.0)

## 2021-02-01 LAB — CBC
HCT: 50.1 % (ref 39.0–52.0)
Hemoglobin: 17 g/dL (ref 13.0–17.0)
MCHC: 34 g/dL (ref 30.0–36.0)
MCV: 92.6 fl (ref 78.0–100.0)
Platelets: 268 10*3/uL (ref 150.0–400.0)
RBC: 5.42 Mil/uL (ref 4.22–5.81)
RDW: 13.2 % (ref 11.5–15.5)
WBC: 6.5 10*3/uL (ref 4.0–10.5)

## 2021-02-01 LAB — HEMOGLOBIN A1C: Hgb A1c MFr Bld: 8.7 % — ABNORMAL HIGH (ref 4.6–6.5)

## 2021-02-01 NOTE — Assessment & Plan Note (Signed)
Flu shot given. Covid-19 up to date. Pneumonia complete. Shingrix counseled to get at pharmacy. Tetanus due 2032. Colonoscopy due 2027 (aged out by then will need consultation with GI if appropriate). Counseled about sun safety and mole surveillance. Counseled about the dangers of distracted driving. Given 10 year screening recommendations.

## 2021-02-01 NOTE — Progress Notes (Signed)
Subjective:   Patient ID: Clifford [name], male    DOB: [birth year], 75 y.o.   MRN: [id number]  HPI Here for medicare wellness and physical, no new complaints. Please see A/P for status and treatment of chronic medical problems.   Diet: DM since diabetic Physical activity: sedentary Depression/mood screen: negative Hearing: intact to whispered voice, mild loss bilaterally Visual acuity: grossly normal, performs annual eye exam  ADLs: capable Fall risk: none Home safety: good Cognitive evaluation: intact to orientation, naming, recall and repetition EOL planning: adv directives discussed  [place] Visit from 02/01/2021 in [place] at [facility] Total Score 0        Fall Risk 07/23/2017 01/26/2019 01/27/2020 12/20/2020 02/01/2021  Falls in the past year? No 0 1 - 1  Was there an injury with Fall? - - 0 - 1  Fall Risk Category Calculator - - 2 - 2  Fall Risk Category - - Moderate - Moderate  Patient Fall Risk Level - Low fall risk - Low fall risk -    I have personally reviewed and have noted 1. The patient's medical and social history - reviewed today no changes 2. Their use of alcohol, tobacco or illicit drugs 3. Their current medications and supplements 4. The patient's functional ability including ADL's, fall risks, home safety risks and hearing or visual impairment. 5. Diet and physical activities 6. Evidence for depression or mood disorders 7. Care team reviewed and updated 8.  The patient is not on an opioid pain medication.  Patient Care Team: Hoyt Koch, MD as PCP - General (Internal Medicine) Josue Hector, MD as PCP - Cardiology (Cardiology) Josue Hector, MD (Cardiology) Raynelle Bring, MD (Urology) Calvert Cantor, MD (Ophthalmology) Zebedee Iba., MD (Ophthalmology) Gatha Mayer, MD (Gastroenterology) Past Medical History:  Diagnosis Date   CAD (coronary artery disease) 02/27/2003   a. Previous stents RCA  and circ, Cath 2005 patient with 70% PDA  //  b. [place] 5/17 >> LHC oLCx 50, pLAD 50, mLAD 95, RPDA 80, normal LVEF >> s/p CABG   Cancer [facility])    Carotid artery disease ([place])    a. Carotid US 5/17 bilat ICA 1-39%   Cataract    replace 11/2012   Diabetes mellitus    diet controlled   History of detached retina repair 11/06/2006   [facility]   History of echocardiogram    a. EF 55% to 60%. Wall motion was normal, Gr 1 DD, Trivial MR/TR   History of hematuria    History of prostate cancer 03/29/2005   s/p prostatectomy   Hyperlipidemia    Hypertension    Past Surgical History:  Procedure Laterality Date   ANGIOPLASTY     1995,2000,2002   CARDIAC CATHETERIZATION  07/26/2000   Initially the stenosis in the proximal to mid right coronary artery was estimated ar 95%. Following stenting, this improved to 0%. There was residual mild disease in the sostium of the proximal right coronary atrtery. Successful stentng of the proximal to mid right coronary artery with improvement in stent diameter narrowing from 95% to 0% Proc Date  07/29/2000   CARDIAC CATHETERIZATION N/A 07/04/2015   Procedure: Left Heart Cath and Coronary Angiography;  Surgeon: Jettie Booze, MD;  Location: [place] CV LAB;  Service: Cardiovascular;  Laterality: N/A;   CATARACT EXTRACTION W/ INTRAOCULAR LENS IMPLANT Bilateral OS Sept '14; OD Oct '14   Dr. Bing Plume   COLONOSCOPY  04/12/2015  Dr.Gessner   CORONARY ARTERY BYPASS GRAFT N/A 07/08/2015   Procedure: CORONARY ARTERY BYPASS GRAFTING (CABG)x three using left internal mammary artery to left anterior decending artery, left greater saphenous vein grafts to diagonal and posterolateral. Left leg greater saphenous leg vein via endoscope. ;  Surgeon: [name], MD;  Location: [place];  Service: Open Heart Surgery;  Laterality: N/A;   CYSTOSCOPY N/A 07/08/2015   Procedure: CYSTOSCOPY FLEXIBLE WITH BALLOON DILATION OF BLADDER NECK CONTRACTURE WITH DIFFICULT CATHETER  PLACEMENT;  Surgeon: Raynelle Bring, MD;  Location: [place];  Service: Urology;  Laterality: N/A;   EYE SURGERY  11/24/2012   left eye cataract   EYE SURGERY  12/22/2012   right eye cataract   HEMORRHOID SURGERY     I&D of thrombosed hemorrhoid   LIPOMA EXCISION     POLYPECTOMY     PROSTATECTOMY  03/29/2005   PTCA  02/27/2003   TEE WITHOUT CARDIOVERSION N/A 07/08/2015   Procedure: TRANSESOPHAGEAL ECHOCARDIOGRAM (TEE);  Surgeon: [name], MD;  Location: [place];  Service: Open Heart Surgery;  Laterality: N/A;   WRIST GANGLION EXCISION     Family History  Problem Relation Age of Onset   CAD Father    Diabetes Sister    Colon polyps Sister    Cancer Neg Hx        Colon   Colon cancer Neg Hx    Esophageal cancer Neg Hx    Rectal cancer Neg Hx    Stomach cancer Neg Hx    Review of Systems  Constitutional: Negative.   HENT: Negative.    Eyes: Negative.   Respiratory:  Negative for cough, chest tightness and shortness of breath.   Cardiovascular:  Negative for chest pain, palpitations and leg swelling.  Gastrointestinal:  Negative for abdominal distention, abdominal pain, constipation, diarrhea, nausea and vomiting.  Musculoskeletal:  Positive for arthralgias.  Skin: Negative.   Neurological: Negative.   Psychiatric/Behavioral: Negative.     Objective:  Physical Exam Constitutional:      Appearance: He is well-developed.  HENT:     Head: Normocephalic and atraumatic.  Cardiovascular:     Rate and Rhythm: Normal rate and regular rhythm.  Pulmonary:     Effort: Pulmonary effort is normal. No respiratory distress.     Breath sounds: Normal breath sounds. No wheezing or rales.  Abdominal:     General: Bowel sounds are normal. There is no distension.     Palpations: Abdomen is soft.     Tenderness: There is no abdominal tenderness. There is no rebound.  Musculoskeletal:     Cervical back: Normal range of motion.  Skin:    General: Skin is warm and dry.     Comments:  Foot exam done  Neurological:     Mental Status: He is alert and oriented to person, place, and time.     Coordination: Coordination normal.    Vitals:   02/01/21 0804  BP: 122/90  Pulse: 76  Resp: 18  SpO2: 97%  Weight: 168 lb 6.4 oz (76.4 kg)  Height: 5\' 4"  (1.626 m)   This visit occurred during the SARS-CoV-2 public health emergency.  Safety protocols were in place, including screening questions prior to the visit, additional usage of staff PPE, and extensive cleaning of exam room while observing appropriate contact time as indicated for disinfecting solutions.   Assessment & Plan:

## 2021-02-01 NOTE — Assessment & Plan Note (Signed)
Checking lipid panel and adjust simvastatin 80 mg daily as needed. No myalgias.

## 2021-02-01 NOTE — Assessment & Plan Note (Signed)
Foot exam done, checking microalbumin to creatinine ratio and HgA1c and lipid panel. Adjust amaryl 2 mg daily as needed. Prior HgA1c severely uncontrolled and he has made lifestyle changes since then. Taking statin and ACE-I. Reminded about yearly eye exam.

## 2021-02-01 NOTE — Assessment & Plan Note (Signed)
BP at goal on lisinopril 10 mg daily. Checking CMP and adjust as needed.  

## 2021-02-01 NOTE — Patient Instructions (Signed)
We will check the labs today and let you know about the results.    

## 2021-02-01 NOTE — Assessment & Plan Note (Signed)
Takes aspirin 81 mg daily and statin. Continue.

## 2021-02-01 NOTE — Assessment & Plan Note (Signed)
Checking CMP, BP at goal, DM not at goal recently rechecking control today.

## 2021-02-28 NOTE — Progress Notes (Signed)
Cardiology Office Note:    Date:  03/14/2021   ID:  Clifford [name], DOB January 14, 1946, MRN [id number]  PCP:  Hoyt Koch, MD  Cardiologist:  Dr. Jenkins Rouge   Electrophysiologist:  n/a  Referring MD: Hoyt Koch, *    History of Present Illness:     Clifford [name] is a 76 y.o. male with a hx of CAD status post prior PCI in 1995, 2000 and 2002, diabetes, HTN, HL, prostate CA status post prostatectomy.    May 2017 accelerated angina needing CABG Dr Servando Snare  LIMA-LAD, SVG-PLVB, SVG-DX.   No angina compliant with meds   Sedentary with COVID and eating too much chocolate A1c 7.5 Discussed with Dr [name] And started on Glucophage    Fell in October playing with friends dog. Had end plate fracture of L2 Still somewhat painful     Past Medical History:  Diagnosis Date   CAD (coronary artery disease) 02/27/2003   a. Previous stents RCA and circ, Cath 2005 patient with 70% PDA  //  b. [place] 5/17 >> LHC oLCx 50, pLAD 50, mLAD 95, RPDA 80, normal LVEF >> s/p CABG   [facility])    Carotid artery disease ([place])    a. Carotid US 5/17 bilat ICA 1-39%   Cataract    replace 11/2012   Diabetes mellitus    diet controlled   History of detached retina repair 11/06/2006   [facility]   History of echocardiogram    a. EF 55% to 60%. Wall motion was normal, Gr 1 DD, Trivial MR/TR   History of hematuria    History of prostate cancer 03/29/2005   s/p prostatectomy   Hyperlipidemia    Hypertension     Past Surgical History:  Procedure Laterality Date   ANGIOPLASTY     1995,2000,2002   CARDIAC CATHETERIZATION  07/26/2000   Initially the stenosis in the proximal to mid right coronary artery was estimated ar 95%. Following stenting, this improved to 0%. There was residual mild disease in the sostium of the proximal right coronary atrtery. Successful stentng of the proximal to mid right coronary artery with improvement in stent diameter narrowing from 95% to 0% Proc Date   07/29/2000   CARDIAC CATHETERIZATION N/A 07/04/2015   Procedure: Left Heart Cath and Coronary Angiography;  Surgeon: Jettie Booze, MD;  Location: [place] CV LAB;  Service: Cardiovascular;  Laterality: N/A;   CATARACT EXTRACTION W/ INTRAOCULAR LENS IMPLANT Bilateral OS Sept '14; OD Oct '14   Dr. Bing Plume   COLONOSCOPY  04/12/2015   Dr.Gessner   CORONARY ARTERY BYPASS GRAFT N/A 07/08/2015   Procedure: CORONARY ARTERY BYPASS GRAFTING (CABG)x three using left internal mammary artery to left anterior decending artery, left greater saphenous vein grafts to diagonal and posterolateral. Left leg greater saphenous leg vein via endoscope. ;  Surgeon: [name], MD;  Location: [place];  Service: Open Heart Surgery;  Laterality: N/A;   CYSTOSCOPY N/A 07/08/2015   Procedure: CYSTOSCOPY FLEXIBLE WITH BALLOON DILATION OF BLADDER NECK CONTRACTURE WITH DIFFICULT CATHETER PLACEMENT;  Surgeon: Raynelle Bring, MD;  Location: [place];  Service: Urology;  Laterality: N/A;   EYE SURGERY  11/24/2012   left eye cataract   EYE SURGERY  12/22/2012   right eye cataract   HEMORRHOID SURGERY     I&D of thrombosed hemorrhoid   LIPOMA EXCISION     POLYPECTOMY     PROSTATECTOMY  03/29/2005   PTCA  02/27/2003   TEE WITHOUT  CARDIOVERSION N/A 07/08/2015   Procedure: TRANSESOPHAGEAL ECHOCARDIOGRAM (TEE);  Surgeon: [name], MD;  Location: [place];  Service: Open Heart Surgery;  Laterality: N/A;   WRIST GANGLION EXCISION      Current Medications: Outpatient Medications Prior to Visit  Medication Sig Dispense Refill   Ascorbic Acid (VITAMIN C PO) Take 1 tablet by mouth daily.     aspirin EC 81 MG tablet Take 81 mg by mouth daily.     blood glucose meter kit and supplies Dispense based on patient and insurance preference. Use up to four times daily as directed. (FOR ICD-9 250.00, 250.01). 1 each 0   glimepiride (AMARYL) 2 MG tablet Take 1 tablet (2 mg total) by mouth daily before breakfast. 90 tablet 3    lisinopril (ZESTRIL) 10 MG tablet TAKE ONE TABLET BY MOUTH ONE TIME DAILY 90 tablet 3   Multiple Vitamin (MULTIVITAMIN) tablet Take 1 tablet by mouth daily.     ONE TOUCH ULTRA TEST test strip 1 each by Other route 2 (two) times daily. Use to check blood sugars twice a day Dx E11.9 100 each 5   ONETOUCH DELICA LANCETS 09T MISC Use to help check blood sugars twice a day Dx e11.9 100 each 5   simvastatin (ZOCOR) 80 MG tablet Take 1 tablet (80 mg total) by mouth daily. 90 tablet 3   lidocaine (LIDODERM) 5 % Place 1 patch onto the skin daily. Remove & Discard patch within 12 hours or as directed by MD (Patient not taking: Reported on 03/14/2021) 15 patch 0   No facility-administered medications prior to visit.      Allergies:   Cinnamon   Social History   Socioeconomic History   Marital status: Married    Spouse name: Not on file   Number of children: Not on file   Years of education: Not on file   Highest education level: Not on file  Occupational History   Not on file  Tobacco Use   Smoking status: Former    Types: Cigarettes    Quit date: 02/27/1983    Years since quitting: 38.0   Smokeless tobacco: Never  Vaping Use   Vaping Use: Never used  Substance and Sexual Activity   Alcohol use: No    Alcohol/week: 0.0 standard drinks   Drug use: No   Sexual activity: Yes    Partners: Female  Other Topics Concern   Not on file  Social History Narrative   Programmer, systems, 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 ACP - discussed with patient and provided packet and referral to [email or web address] Nov '15. He already has stated that if he is :"comatose" pull the plug.    Social Determinants of Health   Financial Resource Strain: Not on file  Food Insecurity: Not on file  Transportation Needs: Not on file  Physical Activity: Not on file  Stress: Not on file  Social Connections: Not on file     Family  History:  The patient's family history includes CAD in his father; Colon polyps in his sister; Diabetes in his sister.   ROS:   Please see the history of present illness.    Review of Systems  Cardiovascular:  Positive for chest pain and leg swelling.  Gastrointestinal:  Positive for constipation.  All other systems reviewed and are negative.   Physical Exam:    VS:  BP (!) 142/98  Pulse 78    Ht '5\' 5"'  (1.651 m)    Wt 173 lb (78.5 kg)    SpO2 98%    BMI 28.79 kg/m    Affect appropriate Healthy:  appears stated age 89: Scar over lip  Neck supple with no adenopathy JVP normal no bruits no thyromegaly Lungs clear with no wheezing and good diaphragmatic motion Heart:  S1/S2 no murmur, no rub, gallop or click PMI normal post sternotomy  Abdomen: benighn, BS positve, no tenderness, no AAA no bruit.  No HSM or HJR Distal pulses intact with no bruits Trace LE bilateral  edema Neuro non-focal Skin warm and dry No muscular weakness   Wt Readings from Last 3 Encounters:  03/14/21 173 lb (78.5 kg)  02/01/21 168 lb 6.4 oz (76.4 kg)  12/29/20 165 lb (74.8 kg)      Studies/Labs Reviewed:     EKG:  1120//20 SR rate 81 old IMI   Recent Labs: 02/01/2021: ALT 19; BUN 11; Creatinine, Ser 1.14; Hemoglobin 17.0; Platelets 268.0; Potassium 4.3; Sodium 139 03/14/2021 NSR rate 78 Normal   Recent Lipid Panel    Component Value Date/Time   CHOL 168 02/01/2021 0833   TRIG 158.0 (H) 02/01/2021 0833   TRIG 133 02/25/2006 0822   HDL 45.60 02/01/2021 0833   CHOLHDL 4 02/01/2021 0833   VLDL 31.6 02/01/2021 0833   LDLCALC 91 02/01/2021 0833   LDLDIRECT 181.0 07/23/2017 0832    Additional studies/ records that were reviewed today include:   Intraop TEE 07/08/15 EF 55% to 60%. Wall motion was normal  Carotid US 07/06/15 Bilaterally - 1% to 39% ICA stenosis. Vertebral artery flow is antegrade.  Echo 07/06/15 EF 55% to 60%. Wall motion was normal, Gr 1 DD, Trivial MR/TR  LHC 07/04/15 Ost  Cx lesion, 50% stenosed. Mid LAD lesion, 95% stenosed. Severe, diffuse disease in the mid to distal LAD which is all heavily calcified. The origin of a moderate-sized diagonal is also compromised. Prox LAD to Mid LAD lesion, 50% stenosed. RPDA lesion, 80% stenosed. The left ventricular systolic function is normal. Normal LVEDP.   ASSESSMENT:     No diagnosis found.  PLAN:     In order of problems listed above:  1. CABG:  07/08/15  Continue ASA and beta blocker no angina - stable   2. HTN - Improved on higher dose ACE   3. HL - Continue vytorin LDL was 54 a year ago most recently up to 91 discussed diet   4. CKD - stable f/u primary avoid over diuresis  Cr better 02/01/21 1.14  5. Carotid artery disease - Mild bilat plaque by [name] 01/28/19 observe   6. DM - A1c too high 11.3 4 months ago most recent 8.7 02/01/21 discussed diet    F/U in a year    Jenkins Rouge

## 2021-03-14 ENCOUNTER — Encounter: Payer: Self-pay | Admitting: Cardiovascular Disease

## 2021-03-14 ENCOUNTER — Other Ambulatory Visit: Payer: Self-pay

## 2021-03-14 ENCOUNTER — Ambulatory Visit (INDEPENDENT_AMBULATORY_CARE_PROVIDER_SITE_OTHER): Payer: Medicare Other | Admitting: Cardiovascular Disease

## 2021-03-14 VITALS — BP 142/98 | HR 78 | Ht 65.0 in | Wt 173.0 lb

## 2021-03-14 DIAGNOSIS — Z951 Presence of aortocoronary bypass graft: Secondary | ICD-10-CM | POA: Diagnosis not present

## 2021-03-14 DIAGNOSIS — I10 Essential (primary) hypertension: Secondary | ICD-10-CM

## 2021-03-14 DIAGNOSIS — E782 Mixed hyperlipidemia: Secondary | ICD-10-CM

## 2021-03-14 NOTE — Patient Instructions (Signed)
Medication Instructions:  °Your physician recommends that you continue on your current medications as directed. Please refer to the Current Medication list given to you today. ° °*If you need a refill on your cardiac medications before your next appointment, please call your pharmacy* ° °Lab Work: °If you have labs (blood work) drawn today and your tests are completely normal, you will receive your results only by: °MyChart Message (if you have MyChart) OR °A paper copy in the mail °If you have any lab test that is abnormal or we need to change your treatment, we will call you to review the results. ° °Testing/Procedures: °None ordered today. ° °Follow-Up: °At [facility], you and your health needs are our priority.  As part of our continuing mission to provide you with exceptional heart care, we have created designated Provider Care Teams.  These Care Teams include your primary Cardiologist (physician) and Advanced Practice Providers (APPs -  Physician Assistants and Nurse Practitioners) who all work together to provide you with the care you need, when you need it. ° °We recommend signing up for the patient portal called "MyChart".  Sign up information is provided on this After Visit Summary.  MyChart is used to connect with patients for Virtual Visits (Telemedicine).  Patients are able to view lab/test results, encounter notes, upcoming appointments, etc.  Non-urgent messages can be sent to your provider as well.   °To learn more about what you can do with MyChart, go to [email or web address].   ° °Your next appointment:   °12 month(s) ° °The format for your next appointment:   °In Person ° °Provider:   °Peter Nishan, MD { ° °

## 2021-04-14 LAB — HM DIABETES EYE EXAM

## 2021-04-16 ENCOUNTER — Other Ambulatory Visit: Payer: Self-pay | Admitting: Internal Medicine

## 2021-05-08 ENCOUNTER — Other Ambulatory Visit: Payer: Self-pay

## 2021-05-08 ENCOUNTER — Ambulatory Visit: Payer: Medicare Other | Admitting: Internal Medicine

## 2021-05-10 ENCOUNTER — Encounter: Payer: Self-pay | Admitting: Internal Medicine

## 2021-05-10 ENCOUNTER — Ambulatory Visit (INDEPENDENT_AMBULATORY_CARE_PROVIDER_SITE_OTHER): Payer: Medicare Other | Admitting: Internal Medicine

## 2021-05-10 ENCOUNTER — Other Ambulatory Visit: Payer: Self-pay

## 2021-05-10 VITALS — BP 128/78 | HR 74 | Resp 18 | Ht 65.0 in | Wt 174.8 lb

## 2021-05-10 DIAGNOSIS — E118 Type 2 diabetes mellitus with unspecified complications: Secondary | ICD-10-CM

## 2021-05-10 LAB — POCT GLYCOSYLATED HEMOGLOBIN (HGB A1C): Hemoglobin A1C: 7.5 % — AB (ref 4.0–5.6)

## 2021-05-10 NOTE — Progress Notes (Signed)
? ?  Subjective:  ? ?Patient ID: Clifford [name], male    DOB: 1945/06/30, 76 y.o.   MRN: [id number] ? ?HPI ?The patient is 76 YO coming in for follow up. ? ?Review of Systems  ?Constitutional: Negative.   ?HENT: Negative.    ?Eyes: Negative.   ?Respiratory:  Negative for cough, chest tightness and shortness of breath.   ?Cardiovascular:  Negative for chest pain, palpitations and leg swelling.  ?Gastrointestinal:  Negative for abdominal distention, abdominal pain, constipation, diarrhea, nausea and vomiting.  ?Musculoskeletal: Negative.   ?Skin: Negative.   ?Neurological: Negative.   ?Psychiatric/Behavioral: Negative.    ? ?Objective:  ?Physical Exam ?Constitutional:   ?   Appearance: He is well-developed.  ?HENT:  ?   Head: Normocephalic and atraumatic.  ?Cardiovascular:  ?   Rate and Rhythm: Normal rate and regular rhythm.  ?Pulmonary:  ?   Effort: Pulmonary effort is normal. No respiratory distress.  ?   Breath sounds: Normal breath sounds. No wheezing or rales.  ?Abdominal:  ?   General: Bowel sounds are normal. There is no distension.  ?   Palpations: Abdomen is soft.  ?   Tenderness: There is no abdominal tenderness. There is no rebound.  ?Musculoskeletal:  ?   Cervical back: Normal range of motion.  ?Skin: ?   General: Skin is warm and dry.  ?Neurological:  ?   Mental Status: He is alert and oriented to person, place, and time.  ?   Coordination: Coordination normal.  ? ? ?Vitals:  ? 05/10/21 0839  ?BP: 128/78  ?Pulse: 74  ?Resp: 18  ?SpO2: 94%  ?Weight: 174 lb 12.8 oz (79.3 kg)  ?Height: '5\' 5"'$  (1.651 m)  ? ? ?This visit occurred during the SARS-CoV-2 public health emergency.  Safety protocols were in place, including screening questions prior to the visit, additional usage of staff PPE, and extensive cleaning of exam room while observing appropriate contact time as indicated for disinfecting solutions.  ? ?Assessment & Plan:  ? ?

## 2021-05-10 NOTE — Patient Instructions (Signed)
Your HgA1c is 7.5 today so keep up the good work. ? ? ?

## 2021-05-10 NOTE — Assessment & Plan Note (Signed)
POC HgA1c done today which is improved to 7.5. This is much better and now controlled. He is taking amaryl 2 mg daily and no low sugar episodes. He has made significant diet and lifestyle changes. Encouraged to continue. We will continue current medications and see him back in 6 months. Taking statin and ACE-I. ?

## 2021-06-05 ENCOUNTER — Other Ambulatory Visit: Payer: Self-pay | Admitting: Cardiovascular Disease

## 2021-07-14 ENCOUNTER — Other Ambulatory Visit: Payer: Self-pay | Admitting: Internal Medicine

## 2021-09-16 ENCOUNTER — Emergency Department (HOSPITAL_BASED_OUTPATIENT_CLINIC_OR_DEPARTMENT_OTHER): Payer: Medicare Other

## 2021-09-16 ENCOUNTER — Emergency Department (HOSPITAL_BASED_OUTPATIENT_CLINIC_OR_DEPARTMENT_OTHER)
Admission: EM | Admit: 2021-09-16 | Discharge: 2021-09-16 | Disposition: A | Discharge status: Home / Self Care | Payer: Medicare Other | Attending: Emergency Medicine | Admitting: Emergency Medicine

## 2021-09-16 ENCOUNTER — Encounter (HOSPITAL_BASED_OUTPATIENT_CLINIC_OR_DEPARTMENT_OTHER): Payer: Self-pay | Admitting: Emergency Medicine

## 2021-09-16 DIAGNOSIS — I10 Essential (primary) hypertension: Secondary | ICD-10-CM | POA: Insufficient documentation

## 2021-09-16 DIAGNOSIS — E119 Type 2 diabetes mellitus without complications: Secondary | ICD-10-CM | POA: Insufficient documentation

## 2021-09-16 DIAGNOSIS — Z7984 Long term (current) use of oral hypoglycemic drugs: Secondary | ICD-10-CM | POA: Diagnosis not present

## 2021-09-16 DIAGNOSIS — Z7982 Long term (current) use of aspirin: Secondary | ICD-10-CM | POA: Diagnosis not present

## 2021-09-16 DIAGNOSIS — S299XXA Unspecified injury of thorax, initial encounter: Secondary | ICD-10-CM | POA: Diagnosis present

## 2021-09-16 DIAGNOSIS — W19XXXA Unspecified fall, initial encounter: Secondary | ICD-10-CM | POA: Diagnosis not present

## 2021-09-16 DIAGNOSIS — S2241XA Multiple fractures of ribs, right side, initial encounter for closed fracture: Secondary | ICD-10-CM | POA: Diagnosis not present

## 2021-09-16 DIAGNOSIS — Z79899 Other long term (current) drug therapy: Secondary | ICD-10-CM | POA: Insufficient documentation

## 2021-09-16 MED ORDER — TIZANIDINE HCL 4 MG PO TABS: 4.0000 mg | ORAL_TABLET | Freq: Four times a day (QID) | ORAL | 0 refills | Status: DC | PRN

## 2021-09-16 MED ORDER — METHOCARBAMOL 500 MG PO TABS
500.0000 mg | ORAL_TABLET | Freq: Once | ORAL | Status: AC
  Administered 2021-09-16: 500 mg via ORAL
  Filled 2021-09-16: qty 1

## 2021-09-16 MED ORDER — OXYCODONE-ACETAMINOPHEN 5-325 MG PO TABS
1.0000 | ORAL_TABLET | Freq: Once | ORAL | Status: DC
  Filled 2021-09-16: qty 1

## 2021-09-16 MED ORDER — FENTANYL CITRATE PF 50 MCG/ML IJ SOSY
50.0000 ug | PREFILLED_SYRINGE | Freq: Once | INTRAMUSCULAR | Status: AC
  Administered 2021-09-16: 50 ug via INTRAVENOUS
  Filled 2021-09-16: qty 1

## 2021-09-16 NOTE — ED Notes (Signed)
Provider is aware of patients blood pressure

## 2021-09-16 NOTE — ED Provider Notes (Signed)
[facility] EMERGENCY DEPARTMENT Provider Note   CSN: [id number] Arrival date & time: 09/16/21  1727     History  Chief Complaint  Patient presents with   Lytle Michaels    Clifford [name] is a 76 y.o. male.  Patient with history of T2DM, and hypertension presents today with complaints of fall. He states that same occurred on Wednesday when he was reaching to grab something on a desk and slipped and struck his right chest on an armchair. He denies hitting his head or any LOC. He states that he felt a crack at that time and feels like his ribs are broken. States he has had rib fractures in the past and this feels similarly. States that he is not having and shortness of breath and has been very active since the fall with at least 2,000 steps a day. States that his pain just isnt getting any better and he feels like he is having muscle spasms in the area of pain and presents today with concerns for same.  The history is provided by the patient. No language interpreter was used.  Fall       Home Medications Prior to Admission medications   Medication Sig Start Date End Date Taking? Authorizing Provider  Ascorbic Acid (VITAMIN C PO) Take 1 tablet by mouth daily.    [provider]  aspirin EC 81 MG tablet Take 81 mg by mouth daily.    [provider]  blood glucose meter kit and supplies Dispense based on patient and insurance preference. Use up to four times daily as directed. (FOR ICD-9 250.00, 250.01). 07/14/15   [name], [name] R, PA-C  glimepiride (AMARYL) 2 MG tablet Take 1 tablet (2 mg total) by mouth daily before breakfast. 10/27/20   Hoyt Koch, MD  lidocaine (LIDODERM) 5 % Place 1 patch onto the skin daily. Remove & Discard patch within 12 hours or as directed by MD Patient not taking: Reported on 05/10/2021 12/20/20   Drenda Freeze, MD  lisinopril (ZESTRIL) 10 MG tablet TAKE ONE TABLET BY MOUTH ONE TIME DAILY 06/05/21   Josue Hector, MD   Multiple Vitamin (MULTIVITAMIN) tablet Take 1 tablet by mouth daily.    [provider]  ONE TOUCH ULTRA TEST test strip 1 each by Other route 2 (two) times daily. Use to check blood sugars twice a day Dx E11.9 09/12/15   Hoyt Koch, MD  [facility] DELICA LANCETS 93A MISC Use to help check blood sugars twice a day Dx e11.9 09/12/15   Hoyt Koch, MD  simvastatin (ZOCOR) 80 MG tablet TAKE ONE TABLET BY MOUTH ONE TIME DAILY 07/14/21   Hoyt Koch, MD      Allergies    Cinnamon    Review of Systems   Review of Systems  All other systems reviewed and are negative.   Physical Exam Updated Vital Signs BP (!) 165/99   Pulse 97   Temp 98 F (36.7 C) (Oral)   Resp 16   Ht '5\' 4"'  (1.626 m)   Wt 72.6 kg   SpO2 93%   BMI 27.46 kg/m  Physical Exam Vitals and nursing note reviewed.  Constitutional:      General: He is not in acute distress.    Appearance: Normal appearance. He is normal weight. He is not ill-appearing, toxic-appearing or diaphoretic.  HENT:     Head: Normocephalic and atraumatic.  Cardiovascular:     Rate and Rhythm: Normal rate and  regular rhythm.     Heart sounds: Normal heart sounds.  Pulmonary:     Effort: Pulmonary effort is normal. No respiratory distress.     Breath sounds: Normal breath sounds.  Chest:     Comments: Tenderness to palpation over the right lower ribcage. No obvious deformity or crepitus noted. No overlying skin changes. Abdominal:     General: Abdomen is flat.     Palpations: Abdomen is soft.  Musculoskeletal:        General: Normal range of motion.     Cervical back: Normal range of motion.  Skin:    General: Skin is warm and dry.  Neurological:     General: No focal deficit present.     Mental Status: He is alert.  Psychiatric:        Mood and Affect: Mood normal.        Behavior: Behavior normal.     ED Results / Procedures / Treatments   Labs (all labs ordered are listed, but only abnormal  results are displayed) Labs Reviewed - No data to display  EKG None  Radiology DG Ribs Unilateral W/Chest Right  Result Date: 09/16/2021 CLINICAL DATA:  Fall 4 days ago.  Right-sided chest wall pain. EXAM: RIGHT RIBS AND CHEST - 3+ VIEW COMPARISON:  08/11/2015. FINDINGS: Subtle nondisplaced fracture of distal/anterior right seventh rib with a possible additional fracture of the adjacent distal/anterior right eighth rib. No other evidence of a recent fracture.  No bone lesion. Stable changes from prior CABG surgery. Cardiac silhouette normal in size. No mediastinal or hilar masses. Low lung volumes. Minor linear atelectasis at the bases. Lungs otherwise clear. No convincing pleural effusion and no pneumothorax. IMPRESSION: 1. Subtle nondisplaced fracture of the distal/anterior right seventh rib with a possible additional fracture of the adjacent eighth rib. 2. No acute cardiopulmonary disease. Electronically Signed   By: Lajean Manes M.D.   On: 09/16/2021 18:42    Procedures Procedures    Medications Ordered in ED Medications  oxyCODONE-acetaminophen (PERCOCET/ROXICET) 5-325 MG per tablet 1 tablet (1 tablet Oral Patient Refused/Not Given 09/16/21 1926)  fentaNYL (SUBLIMAZE) injection 50 mcg (50 mcg Intravenous Given 09/16/21 1812)  methocarbamol (ROBAXIN) tablet 500 mg (500 mg Oral Given 09/16/21 1926)    ED Course/ Medical Decision Making/ A&P                           Medical Decision Making Amount and/or Complexity of Data Reviewed Radiology: ordered.  Risk Prescription drug management.   Patient presents today with complaints of right lower rib pain from a mechanical fall on Wednesday.  He is afebrile, nontoxic-appearing, and in no acute distress with reassuring vital signs.  Rib x-ray obtained which revealed  1. Subtle nondisplaced fracture of the distal/anterior right seventh rib with a possible additional fracture of the adjacent eighth rib. 2. No acute cardiopulmonary  disease.  I have personally reviewed and interpreted this imaging and agree with radiology interpretation.  Given that his fall occurred 3 days ago, and he has continued to be stable since that time without any shortness of breath, do not feel that CT imaging is indicated at this time.  Patient given fentanyl and Robaxin for management of symptoms with significant improvement.  He is able to ambulate throughout the department with no tachypnea, tachycardia, or hypoxia.  Incentive spirometry performed at 1 thousand.  Given patient's age and incentive spirometry on the low end, offered admission to the  patient who states that he would much rather go home.  Given that he is very active at baseline and has close family in the area to look in on him, I think that this is reasonable.  I have given him an incentive spirometer to go home with and instructed him to use this is much as possible and to remain as active as possible to help prevent infection.  He is understanding and amenable with plan, stable for discharge at this time.  We will give a prescription for Zanaflex to help with muscle spasms as it seems to have improved his symptoms here.  Patient educated on red flag symptoms of prompt immediate return.  Also emphasized the importance of close PCP follow-up for continued evaluation and management.  Patient discharged in stable condition.  This is a shared visit with supervising physician Dr. Roslynn Amble who has independently evaluated patient & provided guidance in evaluation/management/disposition, in agreement with care    Final Clinical Impression(s) / ED Diagnoses Final diagnoses:  Fall, initial encounter  Closed fracture of multiple ribs of right side, initial encounter    Rx / DC Orders ED Discharge Orders          Ordered    tiZANidine (ZANAFLEX) 4 MG tablet  Every 6 hours PRN        09/16/21 2052          An After Visit Summary was printed and given to the patient.     Nestor Lewandowsky 09/16/21 2053    Lucrezia Starch, MD 09/17/21 [phone]

## 2021-09-16 NOTE — ED Notes (Addendum)
Patients blood pressure is elevated denies any dizziness chest pain or any other symptoms of hypertension. States that he takes bp medication at night Spasms started to today. Patient is breathing non labored

## 2021-09-16 NOTE — ED Notes (Signed)
Patient refused the oxycodone

## 2021-09-16 NOTE — ED Triage Notes (Signed)
Pt reports fall Wednesday; c/o RT rib pain

## 2021-09-16 NOTE — Discharge Instructions (Addendum)
As we discussed, you do have a few rib fractures in the area that you are tender.  It is very important that you stay as active as possible and use your incentive spirometer as much as you can to help prevent development of pneumonia.  I have also given you a prescription for Zanaflex which is a muscle relaxer for you to take to help with your muscle spasms.  Please do not drive or operate heavy machinery on this medication as it can be sedating.  You may also take Tylenol as needed for additional pain control.  Follow-up with your primary care doctor as needed in the next few days.  Return if development of any new or worsening symptoms.

## 2021-09-16 NOTE — Progress Notes (Signed)
Patient ambulated around the Department while on pulse ox.  Patient's SPO2 remained at 94% and patient's HR remained between 114 and 118.  Patient stated that he did not feel Short of Breath while walking.

## 2021-10-11 ENCOUNTER — Other Ambulatory Visit: Payer: Self-pay | Admitting: Internal Medicine

## 2021-10-15 ENCOUNTER — Other Ambulatory Visit: Payer: Self-pay | Admitting: Internal Medicine

## 2021-11-14 ENCOUNTER — Encounter: Payer: Self-pay | Admitting: Internal Medicine

## 2021-11-14 ENCOUNTER — Ambulatory Visit (INDEPENDENT_AMBULATORY_CARE_PROVIDER_SITE_OTHER): Payer: Medicare Other | Admitting: Internal Medicine

## 2021-11-14 VITALS — BP 140/86 | HR 70 | Ht 64.0 in | Wt 160.0 lb

## 2021-11-14 DIAGNOSIS — E118 Type 2 diabetes mellitus with unspecified complications: Secondary | ICD-10-CM

## 2021-11-14 DIAGNOSIS — Z23 Encounter for immunization: Secondary | ICD-10-CM | POA: Diagnosis not present

## 2021-11-14 LAB — POCT GLYCOSYLATED HEMOGLOBIN (HGB A1C): Hemoglobin A1C: 14.1 % — AB (ref 4.0–5.6)

## 2021-11-14 NOTE — Patient Instructions (Addendum)
Your sugars are high today at 14.1 so we need to go back on the medicine

## 2021-11-14 NOTE — Progress Notes (Signed)
   Subjective:   Patient ID: Clifford [name], male    DOB: 03/09/1945, 76 y.o.   MRN: [id number]  HPI The patient is a 76 YO man coming in for follow up. Some new concerns with unsteadiness and shakiness.   Review of Systems  Constitutional:  Positive for activity change and appetite change.  HENT: Negative.    Eyes: Negative.   Respiratory:  Negative for cough, chest tightness and shortness of breath.   Cardiovascular:  Negative for chest pain, palpitations and leg swelling.  Gastrointestinal:  Negative for abdominal distention, abdominal pain, constipation, diarrhea, nausea and vomiting.  Genitourinary:  Positive for frequency.  Musculoskeletal: Negative.   Skin: Negative.   Neurological:  Positive for weakness.  Psychiatric/Behavioral: Negative.      Objective:  Physical Exam Constitutional:      Appearance: He is well-developed.  HENT:     Head: Normocephalic and atraumatic.  Cardiovascular:     Rate and Rhythm: Normal rate and regular rhythm.  Pulmonary:     Effort: Pulmonary effort is normal. No respiratory distress.     Breath sounds: Normal breath sounds. No wheezing or rales.  Abdominal:     General: Bowel sounds are normal. There is no distension.     Palpations: Abdomen is soft.     Tenderness: There is no abdominal tenderness. There is no rebound.  Musculoskeletal:     Cervical back: Normal range of motion.  Skin:    General: Skin is warm and dry.  Neurological:     Mental Status: He is alert and oriented to person, place, and time.     Coordination: Coordination normal.     Vitals:   11/14/21 0800  BP: (!) 140/86  Pulse: 70  SpO2: 96%  Weight: 160 lb (72.6 kg)  Height: '5\' 4"'$  (1.626 m)    Assessment & Plan:  Flu shot given at visit

## 2021-11-14 NOTE — Assessment & Plan Note (Addendum)
POC HgA1c done at severely elevated at 14.1. He has been off meds due to severe illness and passing of mother in law recently. At goal on prior regimen and diet. Will resume with amaryl 2 mg daily. He is asked to monitor sugars. Talked with patient and wife about diet modification. Follow up 3 months.

## 2022-01-25 ENCOUNTER — Telehealth: Payer: Self-pay | Admitting: Internal Medicine

## 2022-01-25 NOTE — Telephone Encounter (Signed)
LVM for pt to rtn my call to schedule AWV with [facility] call back # [phone] 

## 2022-02-13 ENCOUNTER — Encounter: Payer: Self-pay | Admitting: Internal Medicine

## 2022-02-13 ENCOUNTER — Ambulatory Visit (INDEPENDENT_AMBULATORY_CARE_PROVIDER_SITE_OTHER): Payer: Medicare Other | Admitting: Internal Medicine

## 2022-02-13 VITALS — BP 160/100 | HR 81 | Temp 97.5°F | Ht 64.0 in | Wt 173.0 lb

## 2022-02-13 DIAGNOSIS — I10 Essential (primary) hypertension: Secondary | ICD-10-CM

## 2022-02-13 DIAGNOSIS — N1831 Chronic kidney disease, stage 3a: Secondary | ICD-10-CM | POA: Diagnosis not present

## 2022-02-13 DIAGNOSIS — Z0000 Encounter for general adult medical examination without abnormal findings: Secondary | ICD-10-CM

## 2022-02-13 DIAGNOSIS — E785 Hyperlipidemia, unspecified: Secondary | ICD-10-CM

## 2022-02-13 DIAGNOSIS — E118 Type 2 diabetes mellitus with unspecified complications: Secondary | ICD-10-CM | POA: Diagnosis not present

## 2022-02-13 DIAGNOSIS — I779 Disorder of arteries and arterioles, unspecified: Secondary | ICD-10-CM

## 2022-02-13 DIAGNOSIS — E1169 Type 2 diabetes mellitus with other specified complication: Secondary | ICD-10-CM

## 2022-02-13 LAB — CBC
HCT: 49.9 % (ref 39.0–52.0)
Hemoglobin: 17.3 g/dL — ABNORMAL HIGH (ref 13.0–17.0)
MCHC: 34.6 g/dL (ref 30.0–36.0)
MCV: 91.5 fl (ref 78.0–100.0)
Platelets: 253 10*3/uL (ref 150.0–400.0)
RBC: 5.45 Mil/uL (ref 4.22–5.81)
RDW: 13.2 % (ref 11.5–15.5)
WBC: 6.1 10*3/uL (ref 4.0–10.5)

## 2022-02-13 LAB — COMPREHENSIVE METABOLIC PANEL
ALT: 19 U/L (ref 0–53)
AST: 19 U/L (ref 0–37)
Albumin: 4.3 g/dL (ref 3.5–5.2)
Alkaline Phosphatase: 57 U/L (ref 39–117)
BUN: 16 mg/dL (ref 6–23)
CO2: 29 mEq/L (ref 19–32)
Calcium: 9.3 mg/dL (ref 8.4–10.5)
Chloride: 102 mEq/L (ref 96–112)
Creatinine, Ser: 1.28 mg/dL (ref 0.40–1.50)
GFR: 53.55 mL/min — ABNORMAL LOW (ref >60.00)
Glucose, Bld: 151 mg/dL — ABNORMAL HIGH (ref 70–99)
Potassium: 3.7 mEq/L (ref 3.5–5.1)
Sodium: 138 mEq/L (ref 135–145)
Total Bilirubin: 0.8 mg/dL (ref 0.2–1.2)
Total Protein: 7.2 g/dL (ref 6.0–8.3)

## 2022-02-13 LAB — LIPID PANEL
Cholesterol: 249 mg/dL — ABNORMAL HIGH (ref 0–200)
HDL: 47.4 mg/dL (ref >39.00)
LDL Cholesterol: 174 mg/dL — ABNORMAL HIGH (ref 0–99)
NonHDL: 201.84
Total CHOL/HDL Ratio: 5
Triglycerides: 140 mg/dL (ref 0.0–149.0)
VLDL: 28 mg/dL (ref 0.0–40.0)

## 2022-02-13 LAB — MICROALBUMIN / CREATININE URINE RATIO
Creatinine,U: 194.3 mg/dL
Microalb Creat Ratio: 5.7 mg/g (ref 0.0–30.0)
Microalb, Ur: 11 mg/dL — ABNORMAL HIGH (ref 0.0–1.9)

## 2022-02-13 LAB — HEMOGLOBIN A1C: Hgb A1c MFr Bld: 9.1 % — ABNORMAL HIGH (ref 4.6–6.5)

## 2022-02-13 NOTE — Patient Instructions (Signed)
Check the blood pressure and if >150s let us know.

## 2022-02-13 NOTE — Assessment & Plan Note (Signed)
Checking HgA1c today. Previously severe exacerbation. Taking amaryl 2 mg daily and feels improvement in levels. Adjust as needed. Is on ACE-I and statin. Foot exam done. Reminded about eye exam. Checking lipid panel and microalbumin to creatinine ratio.

## 2022-02-13 NOTE — Progress Notes (Signed)
Subjective:   Patient ID: Clifford [name], male    DOB: [birth year], 76 y.o.   MRN: [id number]  HPI Here for medicare wellness and physical, no new complaints. Please see A/P for status and treatment of chronic medical problems.   Diet: DM since diabetic Physical activity: sedentary Depression/mood screen: negative Hearing: intact to whispered voice, mild loss Visual acuity: grossly normal, performs annual eye exam  ADLs: capable Fall risk: none Home safety: good Cognitive evaluation: intact to orientation, naming, recall and repetition EOL planning: adv directives discussed, in place  [employer] Visit from 02/13/2022 in [facility] at [facility] Visit from 02/13/2022 in [facility] at [facility]  PHQ-9 Total Score 0         12/20/2020    1:04 PM 02/01/2021    8:05 AM 09/16/2021    5:40 PM 11/14/2021    8:06 AM 02/13/2022    8:06 AM  [place] in the past year?  1  1 0  Was there an injury with Fall?  1  1 0  Fall Risk Category Calculator  2  3 0  Fall Risk Category  Moderate  High Low  Patient Fall Risk Level Low fall risk  Moderate fall risk      I have personally reviewed and have noted 1. The patient's medical and social history - reviewed today no changes 2. Their use of alcohol, tobacco or illicit drugs 3. Their current medications and supplements 4. The patient's functional ability including ADL's, fall risks, home safety risks and hearing or visual impairment. 5. Diet and physical activities 6. Evidence for depression or mood disorders 7. Care team reviewed and updated 8.  The patient is not on an opioid pain medication  Patient Care Team: Hoyt Koch, MD as PCP - General (Internal Medicine) Josue Hector, MD as PCP - Cardiology (Cardiology) Josue Hector, MD (Cardiology) Raynelle Bring, MD (Urology) Calvert Cantor, MD (Ophthalmology) Zebedee Iba., MD  (Ophthalmology) Gatha Mayer, MD (Gastroenterology) Past Medical History:  Diagnosis Date   CAD (coronary artery disease) 02/27/2003   a. Previous stents RCA and circ, Cath 2005 patient with 70% PDA  //  b. [place] 5/17 >> LHC oLCx 50, pLAD 50, mLAD 95, RPDA 80, normal LVEF >> s/p CABG   Cancer [facility])    Carotid artery disease ([place])    a. Carotid US 5/17 bilat ICA 1-39%   Cataract    replace 11/2012   Diabetes mellitus    diet controlled   History of detached retina repair 11/06/2006   [facility]   History of echocardiogram    a. EF 55% to 60%. Wall motion was normal, Gr 1 DD, Trivial MR/TR   History of hematuria    History of prostate cancer 03/29/2005   s/p prostatectomy   Hyperlipidemia    Hypertension    Past Surgical History:  Procedure Laterality Date   ANGIOPLASTY     1995,2000,2002   CARDIAC CATHETERIZATION  07/26/2000   Initially the stenosis in the proximal to mid right coronary artery was estimated ar 95%. Following stenting, this improved to 0%. There was residual mild disease in the sostium of the proximal right coronary atrtery. Successful stentng of the proximal to mid right coronary artery with improvement in stent diameter narrowing from 95% to 0% Proc Date  07/29/2000   CARDIAC CATHETERIZATION N/A 07/04/2015  Procedure: Left Heart Cath and Coronary Angiography;  Surgeon: Jettie Booze, MD;  Location: [facility] CV LAB;  Service: Cardiovascular;  Laterality: N/A;   CATARACT EXTRACTION W/ INTRAOCULAR LENS IMPLANT Bilateral OS Sept '14; OD Oct '14   Dr. Bing Plume   COLONOSCOPY  04/12/2015   Dr.Gessner   CORONARY ARTERY BYPASS GRAFT N/A 07/08/2015   Procedure: CORONARY ARTERY BYPASS GRAFTING (CABG)x three using left internal mammary artery to left anterior decending artery, left greater saphenous vein grafts to diagonal and posterolateral. Left leg greater saphenous leg vein via endoscope. ;  Surgeon: [name], MD;  Location: [facility];  Service: Open Heart  Surgery;  Laterality: N/A;   CYSTOSCOPY N/A 07/08/2015   Procedure: CYSTOSCOPY FLEXIBLE WITH BALLOON DILATION OF BLADDER NECK CONTRACTURE WITH DIFFICULT CATHETER PLACEMENT;  Surgeon: Raynelle Bring, MD;  Location: [facility];  Service: Urology;  Laterality: N/A;   EYE SURGERY  11/24/2012   left eye cataract   EYE SURGERY  12/22/2012   right eye cataract   HEMORRHOID SURGERY     I&D of thrombosed hemorrhoid   LIPOMA EXCISION     POLYPECTOMY     PROSTATECTOMY  03/29/2005   PTCA  02/27/2003   TEE WITHOUT CARDIOVERSION N/A 07/08/2015   Procedure: TRANSESOPHAGEAL ECHOCARDIOGRAM (TEE);  Surgeon: [name], MD;  Location: [facility];  Service: Open Heart Surgery;  Laterality: N/A;   WRIST GANGLION EXCISION     Family History  Problem Relation Age of Onset   CAD Father    Diabetes Sister    Colon polyps Sister    Cancer Neg Hx        Colon   Colon cancer Neg Hx    Esophageal cancer Neg Hx    Rectal cancer Neg Hx    Stomach cancer Neg Hx    Review of Systems  Constitutional: Negative.   HENT: Negative.    Eyes: Negative.   Respiratory:  Negative for cough, chest tightness and shortness of breath.   Cardiovascular:  Negative for chest pain, palpitations and leg swelling.  Gastrointestinal:  Negative for abdominal distention, abdominal pain, constipation, diarrhea, nausea and vomiting.  Musculoskeletal: Negative.   Skin: Negative.   Neurological: Negative.   Psychiatric/Behavioral: Negative.      Objective:  Physical Exam Constitutional:      Appearance: He is well-developed.  HENT:     Head: Normocephalic and atraumatic.  Cardiovascular:     Rate and Rhythm: Normal rate and regular rhythm.  Pulmonary:     Effort: Pulmonary effort is normal. No respiratory distress.     Breath sounds: Normal breath sounds. No wheezing or rales.  Abdominal:     General: Bowel sounds are normal. There is no distension.     Palpations: Abdomen is soft.     Tenderness: There is no abdominal  tenderness. There is no rebound.  Musculoskeletal:     Cervical back: Normal range of motion.  Skin:    General: Skin is warm and dry.     Comments: Foot exam done  Neurological:     Mental Status: He is alert and oriented to person, place, and time.     Coordination: Coordination normal.     Vitals:   02/13/22 0806 02/13/22 0810  BP: (!) 160/100 (!) 160/100  Pulse: 81   Temp: (!) 97.5 F (36.4 C)   TempSrc: Oral   SpO2: 96%   Weight: 173 lb (78.5 kg)   Height: '5\' 4"'$  (1.626 m)  Assessment & Plan:

## 2022-02-13 NOTE — Assessment & Plan Note (Signed)
Flu shot up to date. Covid-19 counseled. Pneumonia complete. Shingrix due at pharmacy. Tetanus due 2032. Colonoscopy due 2027 aged out prior to then. Counseled about sun safety and mole surveillance. Counseled about the dangers of distracted driving. Given 10 year screening recommendations.

## 2022-02-13 NOTE — Assessment & Plan Note (Signed)
Taking aspirin and statin. Checking lipid panel and adjust as needed.

## 2022-02-13 NOTE — Assessment & Plan Note (Signed)
Checking CMP and adjust as needed.  

## 2022-02-13 NOTE — Assessment & Plan Note (Signed)
BP moderately high today has not taken medications. BP normal at prior visit on same regimen. Advised to monitor at home and let us know. Adjust lisinopril 10 mg daily as needed.

## 2022-02-13 NOTE — Assessment & Plan Note (Signed)
Checking lipid panel and adjust simvastatin 80 mg daily as needed.

## 2022-03-10 NOTE — Progress Notes (Signed)
Cardiology Office Note:    Date:  03/21/2022   ID:  Clifford [name], [name] 10/28/1945, MRN [id number]  PCP:  Hoyt Koch, MD  Cardiologist:  Dr. Jenkins Rouge   Electrophysiologist:  n/a  Referring MD: Hoyt Koch, *    History of Present Illness:     Clifford [name] is a 77 y.o. male with a hx of CAD status post prior PCI in 1995, 2000 and 2002, diabetes, HTN, HL, prostate CA status post prostatectomy.    May 2017 accelerated angina needing CABG Dr Servando Snare  LIMA-LAD, SVG-PLVB, SVG-DX.   No angina compliant with meds   Sedentary with COVID and eating too much chocolate A1c 9.1 Only on amaryl  for BS now   [place] in October playing with friends dog. Had end plate fracture of L2 Still somewhat painful  BP is high and he seems to have poor understanding of what it should be Was 160/100 mmHg during visit with primary care in December but no med changes made    Past Medical History:  Diagnosis Date   CAD (coronary artery disease) 02/27/2003   a. Previous stents RCA and circ, Cath 2005 patient with 70% PDA  //  b. [place] 5/17 >> LHC oLCx 50, pLAD 50, mLAD 95, RPDA 80, normal LVEF >> s/p CABG   Cancer [facility])    Carotid artery disease ([place])    a. Carotid US 5/17 bilat ICA 1-39%   Cataract    replace 11/2012   Diabetes mellitus    diet controlled   History of detached retina repair 11/06/2006   [facility]   History of echocardiogram    a. EF 55% to 60%. Wall motion was normal, Gr 1 DD, Trivial MR/TR   History of hematuria    History of prostate cancer 03/29/2005   s/p prostatectomy   Hyperlipidemia    Hypertension     Past Surgical History:  Procedure Laterality Date   ANGIOPLASTY     1995,2000,2002   CARDIAC CATHETERIZATION  07/26/2000   Initially the stenosis in the proximal to mid right coronary artery was estimated ar 95%. Following stenting, this improved to 0%. There was residual mild disease in the sostium of the proximal right coronary atrtery.  Successful stentng of the proximal to mid right coronary artery with improvement in stent diameter narrowing from 95% to 0% Proc Date  07/29/2000   CARDIAC CATHETERIZATION N/A 07/04/2015   Procedure: Left Heart Cath and Coronary Angiography;  Surgeon: Jettie Booze, MD;  Location: [place] [facility];  Service: Cardiovascular;  Laterality: N/A;   CATARACT EXTRACTION W/ INTRAOCULAR LENS IMPLANT Bilateral OS Sept '14; OD Oct '14   Dr. Bing Plume   COLONOSCOPY  04/12/2015   Dr.Gessner   CORONARY ARTERY BYPASS GRAFT N/A 07/08/2015   Procedure: CORONARY ARTERY BYPASS GRAFTING (CABG)x three using left internal mammary artery to left anterior decending artery, left greater saphenous vein grafts to diagonal and posterolateral. Left leg greater saphenous leg vein via endoscope. ;  Surgeon: [name], MD;  Location: [place];  Service: Open Heart Surgery;  Laterality: N/A;   CYSTOSCOPY N/A 07/08/2015   Procedure: CYSTOSCOPY FLEXIBLE WITH BALLOON DILATION OF BLADDER NECK CONTRACTURE WITH DIFFICULT CATHETER PLACEMENT;  Surgeon: Raynelle Bring, MD;  Location: [place];  Service: Urology;  Laterality: N/A;   EYE SURGERY  11/24/2012   left eye cataract   EYE SURGERY  12/22/2012   right eye cataract   HEMORRHOID SURGERY     I&D  of thrombosed hemorrhoid   LIPOMA EXCISION     POLYPECTOMY     PROSTATECTOMY  03/29/2005   PTCA  02/27/2003   TEE WITHOUT CARDIOVERSION N/A 07/08/2015   Procedure: TRANSESOPHAGEAL ECHOCARDIOGRAM (TEE);  Surgeon: [name], MD;  Location: [place];  Service: Open Heart Surgery;  Laterality: N/A;   WRIST GANGLION EXCISION      Current Medications: Outpatient Medications Prior to Visit  Medication Sig Dispense Refill   Ascorbic Acid (VITAMIN C PO) Take 1 tablet by mouth daily.     aspirin EC 81 MG tablet Take 81 mg by mouth daily.     blood glucose meter kit and supplies Dispense based on patient and insurance preference. Use up to four times daily as directed. (FOR ICD-9  250.00, 250.01). 1 each 0   glimepiride (AMARYL) 2 MG tablet take 1 tablet by mouth daily before breakfast 90 tablet 0   lisinopril (ZESTRIL) 10 MG tablet TAKE ONE TABLET BY MOUTH ONE TIME DAILY 90 tablet 3   Multiple Vitamin (MULTIVITAMIN) tablet Take 1 tablet by mouth daily.     ONE TOUCH ULTRA TEST test strip 1 each by Other route 2 (two) times daily. Use to check blood sugars twice a day Dx E11.9 100 each 5   ONETOUCH DELICA LANCETS 78G MISC Use to help check blood sugars twice a day Dx e11.9 100 each 5   simvastatin (ZOCOR) 80 MG tablet Take 1 tablet (80 mg total) by mouth daily. Annual appt due in Dec w/labs must see provider for future refills 90 tablet 0   lidocaine (LIDODERM) 5 % Place 1 patch onto the skin daily. Remove & Discard patch within 12 hours or as directed by MD (Patient not taking: Reported on 03/21/2022) 15 patch 0   tiZANidine (ZANAFLEX) 4 MG tablet Take 1 tablet (4 mg total) by mouth every 6 (six) hours as needed for muscle spasms. (Patient not taking: Reported on 03/21/2022) 30 tablet 0   No facility-administered medications prior to visit.      Allergies:   Cinnamon   Social History   Socioeconomic History   Marital status: Married    Spouse name: Not on file   Number of children: Not on file   Years of education: Not on file   Highest education level: Not on file  Occupational History   Not on file  Tobacco Use   Smoking status: Former    Types: Cigarettes    Quit date: 02/27/1983    Years since quitting: 39.0   Smokeless tobacco: Never  Vaping Use   Vaping Use: Never used  Substance and Sexual Activity   Alcohol use: No    Alcohol/week: 0.0 standard drinks of alcohol   Drug use: No   Sexual activity: Yes    Partners: Female  Other Topics Concern   Not on file  Social History Narrative   Programmer, systems, 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 ACP - discussed with patient and  provided packet and referral to [email or web address] Nov '15. He already has stated that if he is :"comatose" pull the plug.    Social Determinants of Health   Financial Resource Strain: Not on file  Food Insecurity: Not on file  Transportation Needs: Not on file  Physical Activity: Not on file  Stress: Not on file  Social Connections: Not on file     Family History:  The patient's  family history includes CAD in his father; Colon polyps in his sister; Diabetes in his sister.   ROS:   Please see the history of present illness.    Review of Systems  Cardiovascular:  Positive for chest pain and leg swelling.  Gastrointestinal:  Positive for constipation.   All other systems reviewed and are negative.   Physical Exam:    VS:  BP (!) 174/96   Pulse 90   Ht '5\' 4"'$  (1.626 m)   Wt 167 lb 12.8 oz (76.1 kg)   SpO2 95%   BMI 28.80 kg/m    Affect appropriate Healthy:  appears stated age 71: Scar over lip  Neck supple with no adenopathy JVP normal no bruits no thyromegaly Lungs clear with no wheezing and good diaphragmatic motion Heart:  S1/S2 no murmur, no rub, gallop or click PMI normal post sternotomy  Abdomen: benighn, BS positve, no tenderness, no AAA no bruit.  No HSM or HJR Distal pulses intact with no bruits Trace LE bilateral  edema Neuro non-focal Skin warm and dry No muscular weakness   Wt Readings from Last 3 Encounters:  03/21/22 167 lb 12.8 oz (76.1 kg)  02/13/22 173 lb (78.5 kg)  11/14/21 160 lb (72.6 kg)      Studies/Labs Reviewed:     EKG:  1120//20 SR rate 77 old IMI   Recent Labs: 02/13/2022: ALT 19; BUN 16; Creatinine, Ser 1.28; Hemoglobin 17.3; Platelets 253.0; Potassium 3.7; Sodium 138 03/21/2022 NSR rate 78 Normal   Recent Lipid Panel    Component Value Date/Time   CHOL 249 (H) 02/13/2022 0850   TRIG 140.0 02/13/2022 0850   TRIG 133 02/25/2006 0822   HDL 47.40 02/13/2022 0850   CHOLHDL 5 02/13/2022 0850   VLDL 28.0 02/13/2022  0850   LDLCALC 174 (H) 02/13/2022 0850   LDLDIRECT 181.0 07/23/2017 0832    Additional studies/ records that were reviewed today include:   Intraop TEE 07/08/15 EF 55% to 60%. Wall motion was normal  Carotid US 07/06/15 Bilaterally - 1% to 39% ICA stenosis. Vertebral artery flow is antegrade.  Echo 07/06/15 EF 55% to 60%. Wall motion was normal, Gr 1 DD, Trivial MR/TR  LHC 07/04/15 Ost Cx lesion, 50% stenosed. Mid LAD lesion, 95% stenosed. Severe, diffuse disease in the mid to distal LAD which is all heavily calcified. The origin of a moderate-sized diagonal is also compromised. Prox LAD to Mid LAD lesion, 50% stenosed. RPDA lesion, 80% stenosed. The left ventricular systolic function is normal. Normal LVEDP.   ASSESSMENT:     No diagnosis found.  PLAN:     In order of problems listed above:  1. CABG:  07/08/15  Continue ASA and beta blocker no angina - stable   2. HTN - poorly controlled Increase lisinopril to 20 mg daily f/u Dr [name]   3. HL - ? Compliance LDL 174 supposed to be on Zocor 80 mg daily  He indicates he is taking will D/C simvastatin and start crestor 40 mg f/u Labs with Dr [name]   4. CKD - stable f/u primary avoid over diuresis  Cr better 02/01/21 1.14  5. Carotid artery disease - Mild bilat plaque by [name] 01/28/19 observe   6. DM - A1c too high  9.1 needs intensification of medication not just amaryl.  F/U Dr [name]    Incease Lisinopril to 20 mg daily Change statin to Crestor 40 mg  F/U Dr [name]  F/U in a year    Jenkins Rouge

## 2022-03-21 ENCOUNTER — Ambulatory Visit: Payer: Medicare Other | Attending: Cardiovascular Disease | Admitting: Cardiovascular Disease

## 2022-03-21 ENCOUNTER — Encounter: Payer: Self-pay | Admitting: Cardiovascular Disease

## 2022-03-21 VITALS — BP 174/96 | HR 90 | Ht 64.0 in | Wt 167.8 lb

## 2022-03-21 DIAGNOSIS — I10 Essential (primary) hypertension: Secondary | ICD-10-CM

## 2022-03-21 DIAGNOSIS — E782 Mixed hyperlipidemia: Secondary | ICD-10-CM | POA: Diagnosis not present

## 2022-03-21 DIAGNOSIS — Z951 Presence of aortocoronary bypass graft: Secondary | ICD-10-CM

## 2022-03-21 MED ORDER — LISINOPRIL 20 MG PO TABS: 20.0000 mg | ORAL_TABLET | Freq: Every day | ORAL | 3 refills | Status: DC

## 2022-03-21 MED ORDER — ROSUVASTATIN CALCIUM 40 MG PO TABS: 40.0000 mg | ORAL_TABLET | Freq: Every day | ORAL | 3 refills | Status: DC

## 2022-03-21 NOTE — Patient Instructions (Addendum)
Medication Instructions:  Your physician has recommended you make the following change in your medication:  INCREASE: lisinopril to 20 mg by mouth once daily  STOP: simvastatin START: rosuvastatin (Crestor) 40 mg by mouth once daily  *If you need a refill on your cardiac medications before your next appointment, please call your pharmacy*   Lab Work: NONE If you have labs (blood work) drawn today and your tests are completely normal, you will receive your results only by: [place] (if you have MyChart) OR A paper copy in the mail If you have any lab test that is abnormal or we need to change your treatment, we will call you to review the results.   Testing/Procedures: NONE   Follow-Up: At [facility], you and your health needs are our priority.  As part of our continuing mission to provide you with exceptional heart care, we have created designated Provider Care Teams.  These Care Teams include your primary Cardiologist (physician) and Advanced Practice Providers (APPs -  Physician Assistants and Nurse Practitioners) who all work together to provide you with the care you need, when you need it.  We recommend signing up for the patient portal called "MyChart".  Sign up information is provided on this After Visit Summary.  MyChart is used to connect with patients for Virtual Visits (Telemedicine).  Patients are able to view lab/test results, encounter notes, upcoming appointments, etc.  Non-urgent messages can be sent to your provider as well.   To learn more about what you can do with MyChart, go to [email or web address].    Your next appointment:   1 year(s)  Provider:   Jenkins Rouge, MD     Other Instructions Please monitor your BP

## 2022-03-22 ENCOUNTER — Other Ambulatory Visit: Payer: Self-pay | Admitting: Internal Medicine

## 2022-03-22 NOTE — Progress Notes (Signed)
Patient did not answer and will need to call us back to set up a 1 month appointment

## 2022-03-23 NOTE — Addendum Note (Signed)
Addended by: Sharee Holster R on: 03/23/2022 07:42 AM   Modules accepted: Orders

## 2022-04-20 LAB — HM DIABETES EYE EXAM

## 2022-04-24 ENCOUNTER — Ambulatory Visit (INDEPENDENT_AMBULATORY_CARE_PROVIDER_SITE_OTHER): Payer: Medicare Other | Admitting: Internal Medicine

## 2022-04-24 ENCOUNTER — Encounter: Payer: Self-pay | Admitting: Internal Medicine

## 2022-04-24 VITALS — BP 160/80 | HR 75 | Temp 97.6°F | Ht 62.0 in | Wt 172.0 lb

## 2022-04-24 DIAGNOSIS — E118 Type 2 diabetes mellitus with unspecified complications: Secondary | ICD-10-CM

## 2022-04-24 DIAGNOSIS — E785 Hyperlipidemia, unspecified: Secondary | ICD-10-CM

## 2022-04-24 DIAGNOSIS — N1831 Chronic kidney disease, stage 3a: Secondary | ICD-10-CM

## 2022-04-24 DIAGNOSIS — E1169 Type 2 diabetes mellitus with other specified complication: Secondary | ICD-10-CM

## 2022-04-24 DIAGNOSIS — I10 Essential (primary) hypertension: Secondary | ICD-10-CM

## 2022-04-24 LAB — COMPREHENSIVE METABOLIC PANEL
ALT: 17 U/L (ref 0–53)
AST: 16 U/L (ref 0–37)
Albumin: 4.2 g/dL (ref 3.5–5.2)
Alkaline Phosphatase: 56 U/L (ref 39–117)
BUN: 19 mg/dL (ref 6–23)
CO2: 29 mEq/L (ref 19–32)
Calcium: 10.4 mg/dL (ref 8.4–10.5)
Chloride: 102 mEq/L (ref 96–112)
Creatinine, Ser: 1.27 mg/dL (ref 0.40–1.50)
GFR: 53.98 mL/min — ABNORMAL LOW (ref >60.00)
Glucose, Bld: 167 mg/dL — ABNORMAL HIGH (ref 70–99)
Potassium: 4.7 mEq/L (ref 3.5–5.1)
Sodium: 138 mEq/L (ref 135–145)
Total Bilirubin: 0.8 mg/dL (ref 0.2–1.2)
Total Protein: 7.4 g/dL (ref 6.0–8.3)

## 2022-04-24 LAB — LIPID PANEL
Cholesterol: 119 mg/dL (ref 0–200)
HDL: 35.5 mg/dL — ABNORMAL LOW (ref >39.00)
NonHDL: 83.16
Total CHOL/HDL Ratio: 3
Triglycerides: 220 mg/dL — ABNORMAL HIGH (ref 0.0–149.0)
VLDL: 44 mg/dL — ABNORMAL HIGH (ref 0.0–40.0)

## 2022-04-24 LAB — HEMOGLOBIN A1C: Hgb A1c MFr Bld: 8.5 % — ABNORMAL HIGH (ref 4.6–6.5)

## 2022-04-24 LAB — LDL CHOLESTEROL, DIRECT: Direct LDL: 60 mg/dL

## 2022-04-24 NOTE — Progress Notes (Signed)
   Subjective:   Patient ID: Clifford [name], male    DOB: [birth year], 77 y.o.   MRN: [id number]  HPI The patient is a 77 YO man coming in for follow up. Cardiology increased lisinopril to 20 mg daily and recommended he follow up here for labs. He is working on weight loss to help with diabetes and blood pressure control.   Review of Systems  Constitutional: Negative.   HENT: Negative.    Eyes: Negative.   Respiratory:  Negative for cough, chest tightness and shortness of breath.   Cardiovascular:  Negative for chest pain, palpitations and leg swelling.  Gastrointestinal:  Negative for abdominal distention, abdominal pain, constipation, diarrhea, nausea and vomiting.  Musculoskeletal: Negative.   Skin: Negative.   Neurological: Negative.   Psychiatric/Behavioral: Negative.      Objective:  Physical Exam Constitutional:      Appearance: He is well-developed.  HENT:     Head: Normocephalic and atraumatic.  Cardiovascular:     Rate and Rhythm: Normal rate and regular rhythm.  Pulmonary:     Effort: Pulmonary effort is normal. No respiratory distress.     Breath sounds: Normal breath sounds. No wheezing or rales.  Abdominal:     General: Bowel sounds are normal. There is no distension.     Palpations: Abdomen is soft.     Tenderness: There is no abdominal tenderness. There is no rebound.  Musculoskeletal:     Cervical back: Normal range of motion.  Skin:    General: Skin is warm and dry.  Neurological:     Mental Status: He is alert and oriented to person, place, and time.     Coordination: Coordination normal.     Vitals:   04/24/22 0826 04/24/22 0829  BP: (!) 180/60 (!) 160/60  Pulse: 75   Temp: 97.6 F (36.4 C)   TempSrc: Oral   SpO2: 99%   Weight: 172 lb (78 kg)   Height: 5' 2"$  (1.575 m)     Assessment & Plan:

## 2022-04-24 NOTE — Assessment & Plan Note (Signed)
Cardiology changed simvastatin 80 mg daily to crestor 40 mg daily. Checking lipid panel today. Prior was not at goal. Goal LDL <100 (<70 ideal). Adjust as needed.

## 2022-04-24 NOTE — Patient Instructions (Signed)
We will recheck the labs today.

## 2022-04-24 NOTE — Assessment & Plan Note (Signed)
Checking CMP with change in dosing of lisinopril 10 mg daily to 20 mg daily about 1 month ago.

## 2022-04-24 NOTE — Assessment & Plan Note (Signed)
Due for labs with change in dose of ACE-I. BP still mildly elevated and depending on labs will plan to increase to 40 mg daily lisinopril.

## 2022-04-24 NOTE — Assessment & Plan Note (Signed)
Diabetes had improved previous and still was not at goal of <8 but much improved. He has continued amaryl 2 mg daily. He is on ACE-I and statin. Checking CMP, lipid panel and HgA1c today. Adjust as needed. No low sugars.

## 2022-04-25 ENCOUNTER — Encounter: Payer: Self-pay | Admitting: Internal Medicine

## 2022-04-30 ENCOUNTER — Other Ambulatory Visit: Payer: Self-pay

## 2022-04-30 ENCOUNTER — Telehealth: Payer: Self-pay | Admitting: Internal Medicine

## 2022-04-30 MED ORDER — EMPAGLIFLOZIN 10 MG PO TABS: 10.0000 mg | ORAL_TABLET | Freq: Every day | ORAL | 2 refills | Status: DC

## 2022-04-30 NOTE — Telephone Encounter (Signed)
Pt called wanted to if Clifford [name] wanted him to take new glucose medication along with is current medication glimepiride (AMARYL) 2 MG tablet. Clifford [name] had wrote  on my chart but pt stated he don't know how to respond.  Please give pt call with update

## 2022-04-30 NOTE — Telephone Encounter (Signed)
Yes, he would take in addition.

## 2022-04-30 NOTE — Telephone Encounter (Signed)
Sent in

## 2022-04-30 NOTE — Telephone Encounter (Signed)
Pt called wanted to if [name] wanted him to take new glucose medication along with is current medication glimepiride (AMARYL) 2 MG tablet. [name] had wrote  on my chart but pt stated he don't know how to respond.  Please give pt call with update

## 2022-05-01 ENCOUNTER — Other Ambulatory Visit: Payer: Self-pay | Admitting: Internal Medicine

## 2022-05-01 NOTE — Telephone Encounter (Signed)
Pt was informed medication has been sent in.

## 2022-06-16 ENCOUNTER — Other Ambulatory Visit: Payer: Self-pay | Admitting: Internal Medicine

## 2022-07-17 ENCOUNTER — Other Ambulatory Visit: Payer: Self-pay | Admitting: Internal Medicine

## 2022-08-15 ENCOUNTER — Encounter: Payer: Self-pay | Admitting: Internal Medicine

## 2022-08-15 ENCOUNTER — Ambulatory Visit (INDEPENDENT_AMBULATORY_CARE_PROVIDER_SITE_OTHER): Payer: Medicare Other | Admitting: Internal Medicine

## 2022-08-15 VITALS — BP 160/80 | HR 81 | Temp 98.0°F | Ht 62.0 in | Wt 171.0 lb

## 2022-08-15 DIAGNOSIS — Z7984 Long term (current) use of oral hypoglycemic drugs: Secondary | ICD-10-CM

## 2022-08-15 DIAGNOSIS — E118 Type 2 diabetes mellitus with unspecified complications: Secondary | ICD-10-CM | POA: Diagnosis not present

## 2022-08-15 LAB — POCT GLYCOSYLATED HEMOGLOBIN (HGB A1C): HbA1c POC (<> result, manual entry): 7 % (ref 4.0–5.6)

## 2022-08-15 MED ORDER — EMPAGLIFLOZIN 10 MG PO TABS: 10.0000 mg | ORAL_TABLET | Freq: Every day | ORAL | 3 refills | Status: DC

## 2022-08-15 NOTE — Progress Notes (Unsigned)
   Subjective:   Patient ID: Clifford [name], male    DOB: Jun 29, 1945, 77 y.o.   MRN: [id number]  HPI The patient is a 77 YO man coming in for follow up diabetes. [place] is making him shaky but he is getting used to it.  Review of Systems  Constitutional: Negative.   HENT: Negative.    Eyes: Negative.   Respiratory:  Negative for cough, chest tightness and shortness of breath.   Cardiovascular:  Negative for chest pain, palpitations and leg swelling.  Gastrointestinal:  Negative for abdominal distention, abdominal pain, constipation, diarrhea, nausea and vomiting.  Musculoskeletal: Negative.   Skin: Negative.   Neurological: Negative.   Psychiatric/Behavioral: Negative.      Objective:  Physical Exam Constitutional:      Appearance: He is well-developed.  HENT:     Head: Normocephalic and atraumatic.  Cardiovascular:     Rate and Rhythm: Normal rate and regular rhythm.  Pulmonary:     Effort: Pulmonary effort is normal. No respiratory distress.     Breath sounds: Normal breath sounds. No wheezing or rales.  Abdominal:     General: Bowel sounds are normal. There is no distension.     Palpations: Abdomen is soft.     Tenderness: There is no abdominal tenderness. There is no rebound.  Musculoskeletal:     Cervical back: Normal range of motion.  Skin:    General: Skin is warm and dry.  Neurological:     Mental Status: He is alert and oriented to person, place, and time.     Coordination: Coordination normal.     Vitals:   08/15/22 0811 08/15/22 0816  BP: (!) 160/80 (!) 160/80  Pulse: 81   Temp: 98 F (36.7 C)   TempSrc: Oral   SpO2: 97%   Weight: 171 lb (77.6 kg)   Height: 5\' 2"  (1.575 m)     Assessment & Plan:

## 2022-08-15 NOTE — Patient Instructions (Signed)
We have sent in the jardiance for 90 days.   The HgA1c is 7.0 today which is great

## 2022-08-16 NOTE — Assessment & Plan Note (Signed)
POC HgA1c done today and at goal. Will continue jardiance 10 mg daily and amaryl 2 mg daily.

## 2022-09-13 ENCOUNTER — Other Ambulatory Visit: Payer: Self-pay | Admitting: Internal Medicine

## 2022-11-09 ENCOUNTER — Other Ambulatory Visit: Payer: Self-pay | Admitting: Internal Medicine

## 2023-01-01 ENCOUNTER — Telehealth: Payer: Self-pay | Admitting: Internal Medicine

## 2023-01-01 NOTE — Telephone Encounter (Signed)
I would need to know what test it is to comment on that

## 2023-01-01 NOTE — Telephone Encounter (Signed)
Patient received a kit in the mail from his insurance for kidney testing. They were informed it is optional per insurance. Patient would like to know if Dr. Okey Dupre thinks they should do it or not. They would like a call back at [phone].

## 2023-01-02 NOTE — Telephone Encounter (Signed)
He does need this test yearly for his diabetes and if he decides to do it with them could he get [name] results otherwise we will do same test in office at next visit (he is due for this in December)

## 2023-01-02 NOTE — Telephone Encounter (Signed)
That sounds like a company. I would need to know what diagnostic test they want him to do.

## 2023-01-02 NOTE — Telephone Encounter (Signed)
[facility] from [place]

## 2023-01-02 NOTE — Telephone Encounter (Signed)
Urine sample

## 2023-01-02 NOTE — Telephone Encounter (Signed)
For kidney testing

## 2023-01-04 NOTE — Telephone Encounter (Signed)
Called patient and lvm

## 2023-02-15 ENCOUNTER — Encounter: Payer: Self-pay | Admitting: Internal Medicine

## 2023-02-15 ENCOUNTER — Ambulatory Visit: Payer: Medicare Other | Admitting: Internal Medicine

## 2023-02-15 VITALS — BP 150/82 | HR 92 | Temp 98.1°F | Ht 62.0 in | Wt 172.0 lb

## 2023-02-15 DIAGNOSIS — Z0000 Encounter for general adult medical examination without abnormal findings: Secondary | ICD-10-CM

## 2023-02-15 DIAGNOSIS — E118 Type 2 diabetes mellitus with unspecified complications: Secondary | ICD-10-CM

## 2023-02-15 DIAGNOSIS — N1831 Chronic kidney disease, stage 3a: Secondary | ICD-10-CM | POA: Diagnosis not present

## 2023-02-15 DIAGNOSIS — I10 Essential (primary) hypertension: Secondary | ICD-10-CM

## 2023-02-15 DIAGNOSIS — I779 Disorder of arteries and arterioles, unspecified: Secondary | ICD-10-CM

## 2023-02-15 DIAGNOSIS — E1169 Type 2 diabetes mellitus with other specified complication: Secondary | ICD-10-CM

## 2023-02-15 DIAGNOSIS — Z23 Encounter for immunization: Secondary | ICD-10-CM | POA: Diagnosis not present

## 2023-02-15 DIAGNOSIS — E785 Hyperlipidemia, unspecified: Secondary | ICD-10-CM

## 2023-02-15 LAB — VITAMIN B12: Vitamin B-12: 257 pg/mL (ref 211–911)

## 2023-02-15 LAB — LIPID PANEL
Cholesterol: 130 mg/dL (ref 0–200)
HDL: 38.6 mg/dL — ABNORMAL LOW (ref >39.00)
LDL Cholesterol: 61 mg/dL (ref 0–99)
NonHDL: 91.28
Total CHOL/HDL Ratio: 3
Triglycerides: 149 mg/dL (ref 0.0–149.0)
VLDL: 29.8 mg/dL (ref 0.0–40.0)

## 2023-02-15 LAB — COMPREHENSIVE METABOLIC PANEL
ALT: 18 U/L (ref 0–53)
AST: 17 U/L (ref 0–37)
Albumin: 4.9 g/dL (ref 3.5–5.2)
Alkaline Phosphatase: 49 U/L (ref 39–117)
BUN: 21 mg/dL (ref 6–23)
CO2: 29 meq/L (ref 19–32)
Calcium: 10.2 mg/dL (ref 8.4–10.5)
Chloride: 104 meq/L (ref 96–112)
Creatinine, Ser: 1.43 mg/dL (ref 0.40–1.50)
GFR: 46.55 mL/min — ABNORMAL LOW (ref >60.00)
Glucose, Bld: 123 mg/dL — ABNORMAL HIGH (ref 70–99)
Potassium: 5 meq/L (ref 3.5–5.1)
Sodium: 142 meq/L (ref 135–145)
Total Bilirubin: 0.9 mg/dL (ref 0.2–1.2)
Total Protein: 7.6 g/dL (ref 6.0–8.3)

## 2023-02-15 LAB — CBC
HCT: 52.8 % — ABNORMAL HIGH (ref 39.0–52.0)
Hemoglobin: 17.9 g/dL — ABNORMAL HIGH (ref 13.0–17.0)
MCHC: 33.8 g/dL (ref 30.0–36.0)
MCV: 93.7 fL (ref 78.0–100.0)
Platelets: 234 10*3/uL (ref 150.0–400.0)
RBC: 5.63 Mil/uL (ref 4.22–5.81)
RDW: 13.3 % (ref 11.5–15.5)
WBC: 8.3 10*3/uL (ref 4.0–10.5)

## 2023-02-15 LAB — MICROALBUMIN / CREATININE URINE RATIO
Creatinine,U: 110.6 mg/dL
Microalb Creat Ratio: 1 mg/g (ref 0.0–30.0)
Microalb, Ur: 1.1 mg/dL (ref 0.0–1.9)

## 2023-02-15 LAB — HEMOGLOBIN A1C: Hgb A1c MFr Bld: 8.1 % — ABNORMAL HIGH (ref 4.6–6.5)

## 2023-02-15 LAB — VITAMIN D 25 HYDROXY (VIT D DEFICIENCY, FRACTURES): VITD: 29.76 ng/mL — ABNORMAL LOW (ref 30.00–100.00)

## 2023-02-15 MED ORDER — EMPAGLIFLOZIN 10 MG PO TABS: 10.0000 mg | ORAL_TABLET | Freq: Every day | ORAL | 3 refills | Status: DC

## 2023-02-15 MED ORDER — GLIMEPIRIDE 2 MG PO TABS: 2.0000 mg | ORAL_TABLET | Freq: Every day | ORAL | 3 refills | Status: DC

## 2023-02-15 NOTE — Assessment & Plan Note (Signed)
Taking aspirin and crestor and continue.

## 2023-02-15 NOTE — Assessment & Plan Note (Signed)
Checking lipid panel and adjust crestor 40 mg daily as needed.  

## 2023-02-15 NOTE — Assessment & Plan Note (Signed)
BP mildly elevated today did not take meds due to fasting. Typically at goal. Will keep lisinopril 20 mg daily and checking CMP adjust as needed.

## 2023-02-15 NOTE — Assessment & Plan Note (Signed)
Checking CMP and adjust as needed.  

## 2023-02-15 NOTE — Assessment & Plan Note (Signed)
Flu shot given. Pneumonia complete. Shingrix due at pharmacy. Tetanus due 2032. Colonoscopy aged out prior to recall. Counseled about sun safety and mole surveillance. Counseled about the dangers of distracted driving. Given 10 year screening recommendations.

## 2023-02-15 NOTE — Assessment & Plan Note (Signed)
Checking HGA1c and B12 and microalbumin to creatinine ratio and lipid panel and CMP. Last controlled on jardiance 10 mg daily and amaryl 2 mg daily. On ACE-I and statin. Eye exam up to date.

## 2023-02-15 NOTE — Progress Notes (Signed)
Subjective:   Patient ID: Clifford [name], male    DOB: [birth year], 77 y.o.   MRN: [id number]  HPI Here for medicare wellness and physical, no new complaints. Please see A/P for status and treatment of chronic medical problems.   Diet: DM since diabetic Physical activity: walks daily Depression/mood screen: negative Hearing: intact to whispered voice, mild loss bilaterally Visual acuity: grossly normal, performs annual eye exam  ADLs: capable Fall risk: none Home safety: good Cognitive evaluation: intact to orientation, naming, recall and repetition EOL planning: adv directives discussed in place  [employer] Visit from 04/24/2022 in [facility] [place]  PHQ-2 Total Score 0       Flowsheet Row Office Visit from 04/24/2022 in [facility]  PHQ-9 Total Score 0         02/01/2021    8:05 AM 09/16/2021    5:40 PM 11/14/2021    8:06 AM 02/13/2022    8:06 AM 04/24/2022    8:30 AM  Fall Risk  Falls in the past year? 1  1 0 0  Was there an injury with Fall? 1  1 0 0  Fall Risk Category Calculator 2  3 0 0  Fall Risk Category (Retired) Moderate  High Low   (RETIRED) Patient Fall Risk Level  Moderate fall risk     Fall risk Follow up     Falls evaluation completed    I have personally reviewed and have noted 1. The patient's medical and social history - reviewed today no changes 2. Their use of alcohol, tobacco or illicit drugs 3. Their current medications and supplements 4. The patient's functional ability including ADL's, fall risks, home safety risks and hearing or visual impairment. 5. Diet and physical activities 6. Evidence for depression or mood disorders 7. Care team reviewed and updated 8.  The patient is not on an opioid pain medication.  Patient Care Team: Myrlene Broker, MD as PCP - General (Internal Medicine) Wendall Stade, MD as PCP - Cardiology (Cardiology) Wendall Stade, MD  (Cardiology) Heloise Purpura, MD (Urology) Nelson Chimes, MD (Ophthalmology) Melvern Sample., MD (Ophthalmology) Iva Boop, MD (Gastroenterology) Past Medical History:  Diagnosis Date   CAD (coronary artery disease) 02/27/2003   a. Previous stents RCA and circ, Cath 2005 patient with 70% PDA  //  b. [place] 5/17 >> LHC oLCx 50, pLAD 50, mLAD 95, RPDA 80, normal LVEF >> s/p CABG   Cancer [facility])    Carotid artery disease (HCC)    a. Carotid US 5/17 bilat ICA 1-39%   Cataract    replace 11/2012   Diabetes mellitus    diet controlled   History of detached retina repair 11/06/2006   [facility]   History of echocardiogram    a. EF 55% to 60%. Wall motion was normal, Gr 1 DD, Trivial MR/TR   History of hematuria    History of prostate cancer 03/29/2005   s/p prostatectomy   Hyperlipidemia    Hypertension    Past Surgical History:  Procedure Laterality Date   ANGIOPLASTY     1995,2000,2002   CARDIAC CATHETERIZATION  07/26/2000   Initially the stenosis in the proximal to mid right coronary artery was estimated ar 95%. Following stenting, this improved to 0%. There was residual mild disease in the sostium of the proximal right coronary atrtery. Successful stentng of the proximal to mid right coronary artery with improvement in stent  diameter narrowing from 95% to 0% Proc Date  07/29/2000   CARDIAC CATHETERIZATION N/A 07/04/2015   Procedure: Left Heart Cath and Coronary Angiography;  Surgeon: Corky Crafts, MD;  Location: [facility] INVASIVE CV LAB;  Service: Cardiovascular;  Laterality: N/A;   CATARACT EXTRACTION W/ INTRAOCULAR LENS IMPLANT Bilateral OS Sept '14; OD Oct '14   Dr. Hazle Quant   COLONOSCOPY  04/12/2015   Dr.Gessner   CORONARY ARTERY BYPASS GRAFT N/A 07/08/2015   Procedure: CORONARY ARTERY BYPASS GRAFTING (CABG)x three using left internal mammary artery to left anterior decending artery, left greater saphenous vein grafts to diagonal and posterolateral. Left leg greater saphenous  leg vein via endoscope. ;  Surgeon: Delight Ovens, MD;  Location: [facility] OR;  Service: Open Heart Surgery;  Laterality: N/A;   CYSTOSCOPY N/A 07/08/2015   Procedure: CYSTOSCOPY FLEXIBLE WITH BALLOON DILATION OF BLADDER NECK CONTRACTURE WITH DIFFICULT CATHETER PLACEMENT;  Surgeon: Heloise Purpura, MD;  Location: [facility] OR;  Service: Urology;  Laterality: N/A;   EYE SURGERY  11/24/2012   left eye cataract   EYE SURGERY  12/22/2012   right eye cataract   HEMORRHOID SURGERY     I&D of thrombosed hemorrhoid   LIPOMA EXCISION     POLYPECTOMY     PROSTATECTOMY  03/29/2005   PTCA  02/27/2003   TEE WITHOUT CARDIOVERSION N/A 07/08/2015   Procedure: TRANSESOPHAGEAL ECHOCARDIOGRAM (TEE);  Surgeon: Delight Ovens, MD;  Location: [facility] OR;  Service: Open Heart Surgery;  Laterality: N/A;   WRIST GANGLION EXCISION     Family History  Problem Relation Age of Onset   CAD Father    Diabetes Sister    Colon polyps Sister    Cancer Neg Hx        Colon   Colon cancer Neg Hx    Esophageal cancer Neg Hx    Rectal cancer Neg Hx    Stomach cancer Neg Hx    Review of Systems  Constitutional: Negative.   HENT: Negative.    Eyes: Negative.   Respiratory:  Negative for cough, chest tightness and shortness of breath.   Cardiovascular:  Negative for chest pain, palpitations and leg swelling.  Gastrointestinal:  Negative for abdominal distention, abdominal pain, constipation, diarrhea, nausea and vomiting.  Musculoskeletal: Negative.   Skin: Negative.   Neurological: Negative.   Psychiatric/Behavioral: Negative.      Objective:  Physical Exam Constitutional:      Appearance: He is well-developed.  HENT:     Head: Normocephalic and atraumatic.  Cardiovascular:     Rate and Rhythm: Normal rate and regular rhythm.  Pulmonary:     Effort: Pulmonary effort is normal. No respiratory distress.     Breath sounds: Normal breath sounds. No wheezing or rales.  Abdominal:     General: Bowel sounds are normal.  There is no distension.     Palpations: Abdomen is soft.     Tenderness: There is no abdominal tenderness. There is no rebound.  Musculoskeletal:     Cervical back: Normal range of motion.  Skin:    General: Skin is warm and dry.  Neurological:     Mental Status: He is alert and oriented to person, place, and time.     Coordination: Coordination normal.    Vitals:   02/15/23 0747 02/15/23 0753 02/15/23 0847  BP: (!) 160/88 (!) 160/88 (!) 150/82  Pulse: 92    Temp: 98.1 F (36.7 C)    TempSrc: Oral    SpO2: 96%  Weight: 172 lb (78 kg)    Height: 5\' 2"  (1.575 m)      Assessment & Plan:  Flu shot given at visit

## 2023-02-16 ENCOUNTER — Other Ambulatory Visit: Payer: Self-pay | Admitting: Cardiovascular Disease

## 2023-02-16 ENCOUNTER — Encounter: Payer: Self-pay | Admitting: Internal Medicine

## 2023-02-18 ENCOUNTER — Other Ambulatory Visit: Payer: Self-pay | Admitting: Cardiovascular Disease

## 2023-03-01 ENCOUNTER — Ambulatory Visit (INDEPENDENT_AMBULATORY_CARE_PROVIDER_SITE_OTHER): Payer: Medicare Other

## 2023-03-01 DIAGNOSIS — E118 Type 2 diabetes mellitus with unspecified complications: Secondary | ICD-10-CM

## 2023-03-01 DIAGNOSIS — E538 Deficiency of other specified B group vitamins: Secondary | ICD-10-CM

## 2023-03-01 MED ORDER — CYANOCOBALAMIN 1000 MCG/ML IJ SOLN
1000.0000 ug | Freq: Once | INTRAMUSCULAR | Status: AC
  Administered 2023-03-01: 1000 ug via INTRAMUSCULAR

## 2023-03-01 NOTE — Progress Notes (Signed)
 Patient here for second B12 injection (out of four) per Dr. Okey Dupre. B12 1000 mcg given in right IM and patient tolerated injection well today.

## 2023-03-08 ENCOUNTER — Ambulatory Visit (INDEPENDENT_AMBULATORY_CARE_PROVIDER_SITE_OTHER): Payer: Medicare Other

## 2023-03-08 DIAGNOSIS — E538 Deficiency of other specified B group vitamins: Secondary | ICD-10-CM

## 2023-03-08 MED ORDER — CYANOCOBALAMIN 1000 MCG/ML IJ SOLN
1000.0000 ug | Freq: Once | INTRAMUSCULAR | Status: AC
  Administered 2023-03-08: 1000 ug via INTRAMUSCULAR

## 2023-03-08 NOTE — Progress Notes (Signed)
 Pt here for monthly B12 injection per   B12 given IM and pt tolerated injection well.  Patient responded well.

## 2023-03-13 NOTE — Progress Notes (Signed)
Cardiology Office Note:    Date:  03/27/2023   ID:  KEARNEY EVITT, [place] Meo Mar 16, 1945, MRN [id number]  PCP:  Myrlene Broker, MD  Cardiologist:  Dr. Charlton Haws   Electrophysiologist:  n/a  Referring MD: Myrlene Broker, *    History of Present Illness:     LATRAIL POUNDERS is a 78 y.o. male with a hx of CAD status post prior PCI in 1995, 2000 and 2002, diabetes, HTN, HL, prostate CA status post prostatectomy.    May 2017 accelerated angina needing CABG Dr Tyrone Sage  LIMA-LAD, SVG-PLVB, SVG-DX.   No angina compliant with meds  BP seems to be on the high side  2nd marriage now on 30 years. Has 2 siblings in [place] and one in [place] area  Despite elevated A1c he seems to be sticking with a low carb diet     Past Medical History:  Diagnosis Date   CAD (coronary artery disease) 02/27/2003   a. Previous stents RCA and circ, Cath 2005 patient with 70% PDA  //  b. [place] 5/17 >> LHC oLCx 50, pLAD 50, mLAD 95, RPDA 80, normal LVEF >> s/p CABG   Cancer [facility])    Carotid artery disease (HCC)    a. Carotid US 5/17 bilat ICA 1-39%   Cataract    replace 11/2012   Diabetes mellitus    diet controlled   History of detached retina repair 11/06/2006   [facility]   History of echocardiogram    a. EF 55% to 60%. Wall motion was normal, Gr 1 DD, Trivial MR/TR   History of hematuria    History of prostate cancer 03/29/2005   s/p prostatectomy   Hyperlipidemia    Hypertension     Past Surgical History:  Procedure Laterality Date   ANGIOPLASTY     1995,2000,2002   CARDIAC CATHETERIZATION  07/26/2000   Initially the stenosis in the proximal to mid right coronary artery was estimated ar 95%. Following stenting, this improved to 0%. There was residual mild disease in the sostium of the proximal right coronary atrtery. Successful stentng of the proximal to mid right coronary artery with improvement in stent diameter narrowing from 95% to 0% Proc Date  07/29/2000    CARDIAC CATHETERIZATION N/A 07/04/2015   Procedure: Left Heart Cath and Coronary Angiography;  Surgeon: Corky Crafts, MD;  Location: [facility] INVASIVE CV LAB;  Service: Cardiovascular;  Laterality: N/A;   CATARACT EXTRACTION W/ INTRAOCULAR LENS IMPLANT Bilateral OS Sept '14; OD Oct '14   Dr. Hazle Quant   COLONOSCOPY  04/12/2015   Dr.Gessner   CORONARY ARTERY BYPASS GRAFT N/A 07/08/2015   Procedure: CORONARY ARTERY BYPASS GRAFTING (CABG)x three using left internal mammary artery to left anterior decending artery, left greater saphenous vein grafts to diagonal and posterolateral. Left leg greater saphenous leg vein via endoscope. ;  Surgeon: Delight Ovens, MD;  Location: [facility] OR;  Service: Open Heart Surgery;  Laterality: N/A;   CYSTOSCOPY N/A 07/08/2015   Procedure: CYSTOSCOPY FLEXIBLE WITH BALLOON DILATION OF BLADDER NECK CONTRACTURE WITH DIFFICULT CATHETER PLACEMENT;  Surgeon: Heloise Purpura, MD;  Location: [facility] OR;  Service: Urology;  Laterality: N/A;   EYE SURGERY  11/24/2012   left eye cataract   EYE SURGERY  12/22/2012   right eye cataract   HEMORRHOID SURGERY     I&D of thrombosed hemorrhoid   LIPOMA EXCISION     POLYPECTOMY     PROSTATECTOMY  03/29/2005   PTCA  02/27/2003   TEE  WITHOUT CARDIOVERSION N/A 07/08/2015   Procedure: TRANSESOPHAGEAL ECHOCARDIOGRAM (TEE);  Surgeon: Delight Ovens, MD;  Location: [facility] OR;  Service: Open Heart Surgery;  Laterality: N/A;   WRIST GANGLION EXCISION      Current Medications: Outpatient Medications Prior to Visit  Medication Sig Dispense Refill   Ascorbic Acid (VITAMIN C PO) Take 1 tablet by mouth daily.     aspirin EC 81 MG tablet Take 81 mg by mouth daily.     blood glucose meter kit and supplies Dispense based on patient and insurance preference. Use up to four times daily as directed. (FOR ICD-9 250.00, 250.01). 1 each 0   empagliflozin (JARDIANCE) 10 MG TABS tablet Take 1 tablet (10 mg total) by mouth daily. 90 tablet 3   glimepiride  (AMARYL) 2 MG tablet Take 1 tablet (2 mg total) by mouth daily with breakfast. 90 tablet 3   lisinopril (ZESTRIL) 20 MG tablet TAKE ONE TABLET BY MOUTH ONE TIME DAILY 45 tablet 0   Multiple Vitamin (MULTIVITAMIN) tablet Take 1 tablet by mouth daily.     ONE TOUCH ULTRA TEST test strip 1 each by Other route 2 (two) times daily. Use to check blood sugars twice a day Dx E11.9 100 each 5   ONETOUCH DELICA LANCETS 33G MISC Use to help check blood sugars twice a day Dx e11.9 100 each 5   rosuvastatin (CRESTOR) 40 MG tablet TAKE ONE TABLET BY MOUTH ONE TIME DAILY 30 tablet 1   No facility-administered medications prior to visit.      Allergies:   Cinnamon   Social History   Socioeconomic History   Marital status: Married    Spouse name: Not on file   Number of children: Not on file   Years of education: Not on file   Highest education level: Not on file  Occupational History   Not on file  Tobacco Use   Smoking status: Former    Current packs/day: 0.00    Types: Cigarettes    Quit date: 02/27/1983    Years since quitting: 40.1   Smokeless tobacco: Never  Vaping Use   Vaping status: Never Used  Substance and Sexual Activity   Alcohol use: No    Alcohol/week: 0.0 standard drinks of alcohol   Drug use: No   Sexual activity: Yes    Partners: Female  Other Topics Concern   Not on file  Social History Narrative   Engineer, agricultural, 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 ACP - discussed with patient and provided packet and referral to [email or web address]., Nov '15. He already has stated that if he is :"comatose" pull the plug.    Social Drivers of Corporate investment banker Strain: Not on file  Food Insecurity: Not on file  Transportation Needs: Not on file  Physical Activity: Not on file  Stress: Not on file  Social Connections: Not on file     Family History:  The patient's family history includes  CAD in his father; Colon polyps in his sister; Diabetes in his sister.   ROS:   Please see the history of present illness.    Review of Systems  Cardiovascular:  Positive for chest pain and leg swelling.  Gastrointestinal:  Positive for constipation.   All other systems reviewed and are negative.   Physical Exam:    VS:  BP (!) 158/80   Pulse 88   Ht  5\' 2"  (1.575 m)   Wt 171 lb (77.6 kg)   SpO2 95%   BMI 31.28 kg/m    Repeat BP 138/70 mmHg by me  Affect appropriate Healthy:  appears stated age HEENT: Scar over lip  Neck supple with no adenopathy JVP normal no bruits no thyromegaly Lungs clear with no wheezing and good diaphragmatic motion Heart:  S1/S2 no murmur, no rub, gallop or click PMI normal post sternotomy  Abdomen: benighn, BS positve, no tenderness, no AAA no bruit.  No HSM or HJR Distal pulses intact with no bruits Trace LE bilateral  edema Neuro non-focal Skin warm and dry No muscular weakness   Wt Readings from Last 3 Encounters:  03/27/23 171 lb (77.6 kg)  02/15/23 172 lb (78 kg)  08/15/22 171 lb (77.6 kg)      Studies/Labs Reviewed:     EKG:  1120//20 SR rate 13 old IMI   Recent Labs: 02/15/2023: ALT 18; BUN 21; Creatinine, Ser 1.43; Hemoglobin 17.9; Platelets 234.0; Potassium 5.0; Sodium 142 03/27/2023 NSR rate 78 Normal   Recent Lipid Panel    Component Value Date/Time   CHOL 130 02/15/2023 0826   TRIG 149.0 02/15/2023 0826   TRIG 133 02/25/2006 0822   HDL 38.60 (L) 02/15/2023 0826   CHOLHDL 3 02/15/2023 0826   VLDL 29.8 02/15/2023 0826   LDLCALC 61 02/15/2023 0826   LDLDIRECT 60.0 04/24/2022 0853    Additional studies/ records that were reviewed today include:   Intraop TEE 07/08/15 EF 55% to 60%. Wall motion was normal  Carotid US 07/06/15 Bilaterally - 1% to 39% ICA stenosis. Vertebral artery flow is antegrade.  Echo 07/06/15 EF 55% to 60%. Wall motion was normal, Gr 1 DD, Trivial MR/TR  LHC 07/04/15 Ost Cx lesion, 50%  stenosed. Mid LAD lesion, 95% stenosed. Severe, diffuse disease in the mid to distal LAD which is all heavily calcified. The origin of a moderate-sized diagonal is also compromised. Prox LAD to Mid LAD lesion, 50% stenosed. RPDA lesion, 80% stenosed. The left ventricular systolic function is normal. Normal LVEDP.   ASSESSMENT:     1. Primary hypertension     PLAN:     In order of problems listed above:  1. CABG:  07/08/15  Continue ASA and beta blocker no angina - stable   2. HTN - ACE increased last visit improved   3. HL - now on crestor LDL 61 much improved   4. CKD - stable f/u primary avoid over diuresis  Cr  1.43   5. Carotid artery disease - Mild bilat plaque by [name] 01/28/19 observe   6. DM - A1c too high  8.1 f/u [place]     F/U in a year    Charlton Haws

## 2023-03-15 ENCOUNTER — Ambulatory Visit (INDEPENDENT_AMBULATORY_CARE_PROVIDER_SITE_OTHER): Payer: Medicare Other

## 2023-03-15 DIAGNOSIS — E538 Deficiency of other specified B group vitamins: Secondary | ICD-10-CM | POA: Diagnosis not present

## 2023-03-15 MED ORDER — CYANOCOBALAMIN 1000 MCG/ML IJ SOLN
1000.0000 ug | Freq: Once | INTRAMUSCULAR | Status: AC
  Administered 2023-03-15: 1000 ug via INTRAMUSCULAR

## 2023-03-15 NOTE — Progress Notes (Addendum)
Pt here for his 3rd  weekly B12 injection per Dr. Okey Dupre  B12 given IM and pt tolerated injection well.  Next B12 injection scheduled for 1/24 for his 4th final weekly injection.

## 2023-03-22 ENCOUNTER — Ambulatory Visit (INDEPENDENT_AMBULATORY_CARE_PROVIDER_SITE_OTHER): Payer: Medicare Other

## 2023-03-22 DIAGNOSIS — E538 Deficiency of other specified B group vitamins: Secondary | ICD-10-CM | POA: Diagnosis not present

## 2023-03-22 MED ORDER — CYANOCOBALAMIN 1000 MCG/ML IJ SOLN
1000.0000 ug | Freq: Once | INTRAMUSCULAR | Status: AC
  Administered 2023-03-22: 1000 ug via INTRAMUSCULAR

## 2023-03-22 NOTE — Progress Notes (Signed)
After obtaining consent, and per orders of Dr. Okey Dupre, injection of B12 given by Ferdie Ping. Patient instructed to remain in clinic for 20 minutes afterwards, and to report any adverse reaction to me immediately.

## 2023-03-27 ENCOUNTER — Encounter: Payer: Self-pay | Admitting: Cardiovascular Disease

## 2023-03-27 ENCOUNTER — Ambulatory Visit: Payer: Medicare Other | Attending: Cardiovascular Disease | Admitting: Cardiovascular Disease

## 2023-03-27 VITALS — BP 158/80 | HR 88 | Ht 62.0 in | Wt 171.0 lb

## 2023-03-27 DIAGNOSIS — I10 Essential (primary) hypertension: Secondary | ICD-10-CM

## 2023-03-27 DIAGNOSIS — E782 Mixed hyperlipidemia: Secondary | ICD-10-CM | POA: Diagnosis not present

## 2023-03-27 DIAGNOSIS — Z951 Presence of aortocoronary bypass graft: Secondary | ICD-10-CM

## 2023-03-27 NOTE — Patient Instructions (Addendum)
Medication Instructions:  Your physician recommends that you continue on your current medications as directed. Please refer to the Current Medication list given to you today.  *If you need a refill on your cardiac medications before your next appointment, please call your pharmacy*  Lab Work: If you have labs (blood work) drawn today and your tests are completely normal, you will receive your results only by: MyChart Message (if you have MyChart) OR A paper copy in the mail If you have any lab test that is abnormal or we need to change your treatment, we will call you to review the results.  Testing/Procedures: None ordered today.  Follow-Up: At [facility], you and your health needs are our priority.  As part of our continuing mission to provide you with exceptional heart care, we have created designated Provider Care Teams.  These Care Teams include your primary Cardiologist (physician) and Advanced Practice Providers (APPs -  Physician Assistants and Nurse Practitioners) who all work together to provide you with the care you need, when you need it.  We recommend signing up for the patient portal called "MyChart".  Sign up information is provided on this After Visit Summary.  MyChart is used to connect with patients for Virtual Visits (Telemedicine).  Patients are able to view lab/test results, encounter notes, upcoming appointments, etc.  Non-urgent messages can be sent to your provider as well.   To learn more about what you can do with MyChart, go to [email or web address].    Your next appointment:   1 year(s)  Provider:   Charlton Haws, MD     Other Instructions    1st Floor: - Lobby - Registration  - Pharmacy  - Lab - Cafe  2nd Floor: - PV Lab - Diagnostic Testing (echo, CT, nuclear med)  3rd Floor: - Vacant  4th Floor: - TCTS (cardiothoracic surgery) - AFib Clinic - Structural Heart Clinic - Vascular Surgery  - Vascular Ultrasound  5th Floor: -  HeartCare Cardiology (general and EP) - Clinical Pharmacy for coumadin, hypertension, lipid, weight-loss medications, and med management appointments    Valet parking services will be available as well.

## 2023-04-02 ENCOUNTER — Other Ambulatory Visit: Payer: Self-pay | Admitting: Internal Medicine

## 2023-04-02 MED ORDER — LISINOPRIL 20 MG PO TABS: 20.0000 mg | ORAL_TABLET | Freq: Every day | ORAL | 3 refills | Status: AC

## 2023-04-02 MED ORDER — ROSUVASTATIN CALCIUM 40 MG PO TABS: 40.0000 mg | ORAL_TABLET | Freq: Every day | ORAL | 3 refills | Status: AC

## 2023-04-30 LAB — HM DIABETES EYE EXAM

## 2023-06-03 ENCOUNTER — Encounter: Payer: Self-pay | Admitting: Internal Medicine

## 2023-06-03 ENCOUNTER — Ambulatory Visit (INDEPENDENT_AMBULATORY_CARE_PROVIDER_SITE_OTHER): Admitting: Internal Medicine

## 2023-06-03 ENCOUNTER — Ambulatory Visit (INDEPENDENT_AMBULATORY_CARE_PROVIDER_SITE_OTHER)

## 2023-06-03 VITALS — BP 160/80 | HR 99 | Temp 98.2°F | Ht 62.0 in | Wt 174.0 lb

## 2023-06-03 DIAGNOSIS — M545 Low back pain, unspecified: Secondary | ICD-10-CM | POA: Insufficient documentation

## 2023-06-03 DIAGNOSIS — E118 Type 2 diabetes mellitus with unspecified complications: Secondary | ICD-10-CM | POA: Diagnosis not present

## 2023-06-03 DIAGNOSIS — R29898 Other symptoms and signs involving the musculoskeletal system: Secondary | ICD-10-CM | POA: Insufficient documentation

## 2023-06-03 DIAGNOSIS — Z7984 Long term (current) use of oral hypoglycemic drugs: Secondary | ICD-10-CM | POA: Diagnosis not present

## 2023-06-03 LAB — POCT GLYCOSYLATED HEMOGLOBIN (HGB A1C): HbA1c POC (<> result, manual entry): 7.9 % (ref 4.0–5.6)

## 2023-06-03 NOTE — Assessment & Plan Note (Signed)
 Could be neuropathy from diabetes and/or spinal stenosis. Does not sound typical for PAD although prior CABG so if other workup unrevealing will do ABI. We will hold jardiance for 2-3 weeks and see if this helps. He could have some level of dehydration.

## 2023-06-03 NOTE — Assessment & Plan Note (Signed)
 I suspect that lumbar stenosis could be causing the balance deficit and weakness. Checking x-ray lumbar to assess. Prior fracture L2. No recent fall. It is possible that jardiance could be related. We will hold this for 2 weeks to see if there is any improvement. Treat the x-ray findings as appropriate.

## 2023-06-03 NOTE — Patient Instructions (Signed)
 We will have you stop the jardiance for 2 weeks and let us know how you are doing.  We will do the x-ray of the back today.

## 2023-06-03 NOTE — Assessment & Plan Note (Signed)
 POC HgA1c done and mild improvement at 7.9. He is concerned about side effects of jardiance. Will hold jardiance for 2-3 weeks and then let us know how he is doing. Continue amaryl. We may be able to change the dose of amaryl if jardiance is the culprit for symptoms.

## 2023-06-03 NOTE — Progress Notes (Signed)
   Subjective:   Patient ID: Clifford [name], male    DOB: 11/27/1945, 78 y.o.   MRN: [id number]  HPI The patient is a 78 YO man coming in for issues with legs and weakness when walking for a long distance. Feels like a nerve/muscle thing. He does not get pain with walking but feels exhausted. Denies new chest pain or pressure. Denies SOB. Denies pain in legs. Sometimes feels like the legs do not know where they are in space. He denies specific numbness. Some pain in his low back which is more consistent lately. Has some tremor in the hands which is worse and better at times. Feels like this worsened since starting jardiance and was reading about side effects and it may be related.   Review of Systems  Constitutional:  Positive for activity change and fatigue.  HENT: Negative.    Eyes: Negative.   Respiratory:  Negative for cough, chest tightness and shortness of breath.   Cardiovascular:  Negative for chest pain, palpitations and leg swelling.  Gastrointestinal:  Negative for abdominal distention, abdominal pain, constipation, diarrhea, nausea and vomiting.  Musculoskeletal:  Positive for gait problem and myalgias.  Skin: Negative.   Neurological:  Positive for tremors and weakness.  Psychiatric/Behavioral: Negative.      Objective:  Physical Exam Constitutional:      Appearance: He is well-developed.  HENT:     Head: Normocephalic and atraumatic.  Cardiovascular:     Rate and Rhythm: Normal rate and regular rhythm.  Pulmonary:     Effort: Pulmonary effort is normal. No respiratory distress.     Breath sounds: Normal breath sounds. No wheezing or rales.  Abdominal:     General: Bowel sounds are normal. There is no distension.     Palpations: Abdomen is soft.     Tenderness: There is no abdominal tenderness. There is no rebound.  Musculoskeletal:     Cervical back: Normal range of motion.  Skin:    General: Skin is warm and dry.  Neurological:     Mental Status: He is alert and  oriented to person, place, and time.     Coordination: Coordination abnormal.     Comments: Some tremor right hand more than left, no pill rolling motion and no rigidity of the arms/elbows     Vitals:   06/03/23 1323 06/03/23 1329  BP: (!) 160/80 (!) 160/80  Pulse: 99   Temp: 98.2 F (36.8 C)   TempSrc: Oral   SpO2: 94%   Weight: 174 lb (78.9 kg)   Height: 5\' 2"  (1.575 m)     Assessment & Plan:

## 2023-06-19 ENCOUNTER — Encounter: Payer: Self-pay | Admitting: Internal Medicine

## 2023-06-20 ENCOUNTER — Encounter: Payer: Self-pay | Admitting: Internal Medicine

## 2023-06-20 NOTE — Telephone Encounter (Signed)
 FYI

## 2023-08-19 ENCOUNTER — Ambulatory Visit: Payer: Medicare Other | Admitting: Internal Medicine

## 2023-08-19 ENCOUNTER — Encounter: Payer: Self-pay | Admitting: Internal Medicine

## 2023-08-19 VITALS — BP 160/82 | HR 68 | Temp 97.8°F | Ht 62.0 in | Wt 173.0 lb

## 2023-08-19 DIAGNOSIS — Z7984 Long term (current) use of oral hypoglycemic drugs: Secondary | ICD-10-CM

## 2023-08-19 DIAGNOSIS — E538 Deficiency of other specified B group vitamins: Secondary | ICD-10-CM

## 2023-08-19 DIAGNOSIS — E118 Type 2 diabetes mellitus with unspecified complications: Secondary | ICD-10-CM

## 2023-08-19 DIAGNOSIS — I10 Essential (primary) hypertension: Secondary | ICD-10-CM | POA: Diagnosis not present

## 2023-08-19 DIAGNOSIS — N1831 Chronic kidney disease, stage 3a: Secondary | ICD-10-CM

## 2023-08-19 DIAGNOSIS — R29898 Other symptoms and signs involving the musculoskeletal system: Secondary | ICD-10-CM

## 2023-08-19 LAB — CBC
HCT: 51.3 % (ref 39.0–52.0)
Hemoglobin: 17.3 g/dL — ABNORMAL HIGH (ref 13.0–17.0)
MCHC: 33.8 g/dL (ref 30.0–36.0)
MCV: 91.1 fl (ref 78.0–100.0)
Platelets: 224 10*3/uL (ref 150.0–400.0)
RBC: 5.63 Mil/uL (ref 4.22–5.81)
RDW: 13.4 % (ref 11.5–15.5)
WBC: 7.8 10*3/uL (ref 4.0–10.5)

## 2023-08-19 LAB — COMPREHENSIVE METABOLIC PANEL WITH GFR
ALT: 17 U/L (ref 0–53)
AST: 18 U/L (ref 0–37)
Albumin: 4.7 g/dL (ref 3.5–5.2)
Alkaline Phosphatase: 56 U/L (ref 39–117)
BUN: 16 mg/dL (ref 6–23)
CO2: 29 meq/L (ref 19–32)
Calcium: 10 mg/dL (ref 8.4–10.5)
Chloride: 104 meq/L (ref 96–112)
Creatinine, Ser: 1.39 mg/dL (ref 0.40–1.50)
GFR: 47.99 mL/min — ABNORMAL LOW (ref >60.00)
Glucose, Bld: 120 mg/dL — ABNORMAL HIGH (ref 70–99)
Potassium: 4.7 meq/L (ref 3.5–5.1)
Sodium: 141 meq/L (ref 135–145)
Total Bilirubin: 0.7 mg/dL (ref 0.2–1.2)
Total Protein: 7.8 g/dL (ref 6.0–8.3)

## 2023-08-19 LAB — HEMOGLOBIN A1C: Hgb A1c MFr Bld: 8 % — ABNORMAL HIGH (ref 4.6–6.5)

## 2023-08-19 LAB — VITAMIN B12: Vitamin B-12: 319 pg/mL (ref 211–911)

## 2023-08-19 NOTE — Patient Instructions (Signed)
 We will check the labs today.

## 2023-08-19 NOTE — Progress Notes (Unsigned)
   Subjective:   Patient ID: Clifford [name], male    DOB: [birth year], 78 y.o.   MRN: [id number]  HPI The patient is an 78 YO man coming in for balance issues and high BP at home lately.   Review of Systems  Objective:  Physical Exam  Vitals:   08/19/23 0755  BP: (!) 160/82  Pulse: 68  Temp: 97.8 F (36.6 C)  TempSrc: Oral  SpO2: 98%  Weight: 173 lb (78.5 kg)  Height: 5' 2 (1.575 m)    Assessment & Plan:

## 2023-08-22 NOTE — Assessment & Plan Note (Signed)
 Suspect this is coming from prior lumbar fracture which was progressed on recent x-ray. He declines referral to PT or neurosurgery today.

## 2023-08-22 NOTE — Assessment & Plan Note (Signed)
 Checking CMP and adjust as needed.

## 2023-08-22 NOTE — Assessment & Plan Note (Signed)
 BP elevated today. Checking CMP and asked him to monitor. He is having new dizziness and some readings are high and some are low at home. Taking lisinopril  20 mg daily. Adjust as needed.

## 2023-08-22 NOTE — Assessment & Plan Note (Signed)
 Checking HGA1c although a few days early due to getting labs. Adjust amaryl  2 mg daily as needed. Is on statin and ACE-I.

## 2023-08-23 ENCOUNTER — Ambulatory Visit: Payer: Self-pay | Admitting: Internal Medicine

## 2023-10-26 ENCOUNTER — Emergency Department (HOSPITAL_COMMUNITY)

## 2023-10-26 ENCOUNTER — Other Ambulatory Visit: Payer: Self-pay

## 2023-10-26 ENCOUNTER — Inpatient Hospital Stay (HOSPITAL_COMMUNITY)
Admission: EM | Admit: 2023-10-26 | Discharge: 2023-10-29 | DRG: 177 | Disposition: A | Discharge status: Home / Self Care | Attending: Internal Medicine | Admitting: Internal Medicine

## 2023-10-26 DIAGNOSIS — Z9079 Acquired absence of other genital organ(s): Secondary | ICD-10-CM

## 2023-10-26 DIAGNOSIS — J9601 Acute respiratory failure with hypoxia: Secondary | ICD-10-CM | POA: Insufficient documentation

## 2023-10-26 DIAGNOSIS — Z800 Family history of malignant neoplasm of digestive organs: Secondary | ICD-10-CM

## 2023-10-26 DIAGNOSIS — Z9842 Cataract extraction status, left eye: Secondary | ICD-10-CM

## 2023-10-26 DIAGNOSIS — I2510 Atherosclerotic heart disease of native coronary artery without angina pectoris: Secondary | ICD-10-CM | POA: Diagnosis present

## 2023-10-26 DIAGNOSIS — Z79899 Other long term (current) drug therapy: Secondary | ICD-10-CM

## 2023-10-26 DIAGNOSIS — U071 COVID-19: Principal | ICD-10-CM | POA: Diagnosis present

## 2023-10-26 DIAGNOSIS — Z7982 Long term (current) use of aspirin: Secondary | ICD-10-CM

## 2023-10-26 DIAGNOSIS — Z83719 Family history of colon polyps, unspecified: Secondary | ICD-10-CM

## 2023-10-26 DIAGNOSIS — E785 Hyperlipidemia, unspecified: Secondary | ICD-10-CM | POA: Diagnosis present

## 2023-10-26 DIAGNOSIS — Z961 Presence of intraocular lens: Secondary | ICD-10-CM | POA: Diagnosis present

## 2023-10-26 DIAGNOSIS — N179 Acute kidney failure, unspecified: Secondary | ICD-10-CM | POA: Diagnosis present

## 2023-10-26 DIAGNOSIS — Z951 Presence of aortocoronary bypass graft: Secondary | ICD-10-CM

## 2023-10-26 DIAGNOSIS — N1831 Chronic kidney disease, stage 3a: Secondary | ICD-10-CM | POA: Diagnosis present

## 2023-10-26 DIAGNOSIS — Z23 Encounter for immunization: Secondary | ICD-10-CM

## 2023-10-26 DIAGNOSIS — I129 Hypertensive chronic kidney disease with stage 1 through stage 4 chronic kidney disease, or unspecified chronic kidney disease: Secondary | ICD-10-CM | POA: Diagnosis present

## 2023-10-26 DIAGNOSIS — J1282 Pneumonia due to coronavirus disease 2019: Secondary | ICD-10-CM | POA: Diagnosis present

## 2023-10-26 DIAGNOSIS — R0600 Dyspnea, unspecified: Principal | ICD-10-CM

## 2023-10-26 DIAGNOSIS — T380X5A Adverse effect of glucocorticoids and synthetic analogues, initial encounter: Secondary | ICD-10-CM | POA: Diagnosis not present

## 2023-10-26 DIAGNOSIS — Z8546 Personal history of malignant neoplasm of prostate: Secondary | ICD-10-CM

## 2023-10-26 DIAGNOSIS — Z87891 Personal history of nicotine dependence: Secondary | ICD-10-CM

## 2023-10-26 DIAGNOSIS — I5A Non-ischemic myocardial injury (non-traumatic): Secondary | ICD-10-CM | POA: Diagnosis present

## 2023-10-26 DIAGNOSIS — Z7984 Long term (current) use of oral hypoglycemic drugs: Secondary | ICD-10-CM

## 2023-10-26 DIAGNOSIS — E1122 Type 2 diabetes mellitus with diabetic chronic kidney disease: Secondary | ICD-10-CM | POA: Diagnosis present

## 2023-10-26 DIAGNOSIS — E118 Type 2 diabetes mellitus with unspecified complications: Secondary | ICD-10-CM

## 2023-10-26 DIAGNOSIS — R7989 Other specified abnormal findings of blood chemistry: Secondary | ICD-10-CM | POA: Insufficient documentation

## 2023-10-26 DIAGNOSIS — Z91018 Allergy to other foods: Secondary | ICD-10-CM

## 2023-10-26 DIAGNOSIS — Z833 Family history of diabetes mellitus: Secondary | ICD-10-CM

## 2023-10-26 DIAGNOSIS — M25561 Pain in right knee: Secondary | ICD-10-CM | POA: Diagnosis present

## 2023-10-26 DIAGNOSIS — Z9841 Cataract extraction status, right eye: Secondary | ICD-10-CM

## 2023-10-26 DIAGNOSIS — Z8249 Family history of ischemic heart disease and other diseases of the circulatory system: Secondary | ICD-10-CM

## 2023-10-26 LAB — I-STAT CHEM 8, ED
BUN: 26 mg/dL — ABNORMAL HIGH (ref 8–23)
Calcium, Ion: 1.12 mmol/L — ABNORMAL LOW (ref 1.15–1.40)
Chloride: 102 mmol/L (ref 98–111)
Creatinine, Ser: 1.7 mg/dL — ABNORMAL HIGH (ref 0.61–1.24)
Glucose, Bld: 166 mg/dL — ABNORMAL HIGH (ref 70–99)
HCT: 50 % (ref 39.0–52.0)
Hemoglobin: 17 g/dL (ref 13.0–17.0)
Potassium: 3.7 mmol/L (ref 3.5–5.1)
Sodium: 138 mmol/L (ref 135–145)
TCO2: 21 mmol/L — ABNORMAL LOW (ref 22–32)

## 2023-10-26 LAB — CBC WITH DIFFERENTIAL/PLATELET
Abs Immature Granulocytes: 0.04 K/uL (ref 0.00–0.07)
Basophils Absolute: 0 K/uL (ref 0.0–0.1)
Basophils Relative: 0 %
Eosinophils Absolute: 0 K/uL (ref 0.0–0.5)
Eosinophils Relative: 0 %
HCT: 48.5 % (ref 39.0–52.0)
Hemoglobin: 16.8 g/dL (ref 13.0–17.0)
Immature Granulocytes: 0 %
Lymphocytes Relative: 4 %
Lymphs Abs: 0.5 K/uL — ABNORMAL LOW (ref 0.7–4.0)
MCH: 31.1 pg (ref 26.0–34.0)
MCHC: 34.6 g/dL (ref 30.0–36.0)
MCV: 89.6 fL (ref 80.0–100.0)
Monocytes Absolute: 0.8 K/uL (ref 0.1–1.0)
Monocytes Relative: 7 %
Neutro Abs: 9.8 K/uL — ABNORMAL HIGH (ref 1.7–7.7)
Neutrophils Relative %: 89 %
Platelets: 165 K/uL (ref 150–400)
RBC: 5.41 MIL/uL (ref 4.22–5.81)
RDW: 12.6 % (ref 11.5–15.5)
WBC: 11.1 K/uL — ABNORMAL HIGH (ref 4.0–10.5)
nRBC: 0 % (ref 0.0–0.2)

## 2023-10-26 LAB — TROPONIN I (HIGH SENSITIVITY)
Troponin I (High Sensitivity): 38 ng/L — ABNORMAL HIGH (ref <18)
Troponin I (High Sensitivity): 34 ng/L — ABNORMAL HIGH (ref <18)

## 2023-10-26 LAB — COMPREHENSIVE METABOLIC PANEL WITH GFR
ALT: 38 U/L (ref 0–44)
AST: 83 U/L — ABNORMAL HIGH (ref 15–41)
Albumin: 3.5 g/dL (ref 3.5–5.0)
Alkaline Phosphatase: 37 U/L — ABNORMAL LOW (ref 38–126)
Anion gap: 14 (ref 5–15)
BUN: 25 mg/dL — ABNORMAL HIGH (ref 8–23)
CO2: 22 mmol/L (ref 22–32)
Calcium: 8.5 mg/dL — ABNORMAL LOW (ref 8.9–10.3)
Chloride: 103 mmol/L (ref 98–111)
Creatinine, Ser: 1.77 mg/dL — ABNORMAL HIGH (ref 0.61–1.24)
GFR, Estimated: 38 mL/min — ABNORMAL LOW (ref >60)
Glucose, Bld: 161 mg/dL — ABNORMAL HIGH (ref 70–99)
Potassium: 3.5 mmol/L (ref 3.5–5.1)
Sodium: 139 mmol/L (ref 135–145)
Total Bilirubin: 0.7 mg/dL (ref 0.0–1.2)
Total Protein: 6.4 g/dL — ABNORMAL LOW (ref 6.5–8.1)

## 2023-10-26 LAB — RESP PANEL BY RT-PCR (RSV, FLU A&B, COVID)  RVPGX2
Influenza A by PCR: NEGATIVE
Influenza B by PCR: NEGATIVE
Resp Syncytial Virus by PCR: NEGATIVE
SARS Coronavirus 2 by RT PCR: POSITIVE — AB

## 2023-10-26 LAB — BRAIN NATRIURETIC PEPTIDE: B Natriuretic Peptide: 112.4 pg/mL — ABNORMAL HIGH (ref 0.0–100.0)

## 2023-10-26 LAB — I-STAT CG4 LACTIC ACID, ED
Lactic Acid, Venous: 1.6 mmol/L (ref 0.5–1.9)
Lactic Acid, Venous: 1 mmol/L (ref 0.5–1.9)

## 2023-10-26 MED ORDER — SODIUM CHLORIDE 0.9 % IV BOLUS
1000.0000 mL | Freq: Once | INTRAVENOUS | Status: AC
  Administered 2023-10-26: 1000 mL via INTRAVENOUS

## 2023-10-26 MED ORDER — DEXAMETHASONE SODIUM PHOSPHATE 10 MG/ML IJ SOLN
6.0000 mg | Freq: Once | INTRAMUSCULAR | Status: AC
  Administered 2023-10-27: 6 mg via INTRAVENOUS
  Filled 2023-10-26: qty 1

## 2023-10-26 NOTE — ED Provider Notes (Addendum)
  Provider Note MRN:  [id number]  Arrival date & time: 10/26/23    ED Course and Medical Decision Making  Assumed care of patient at sign-out or upon transfer.  COVID-19 with hypoxia, new oxygen requirement, acute kidney injury requesting hospitalist admission.  .Critical Care  Performed by: Theadore Ozell HERO, MD Authorized by: Theadore Ozell HERO, MD   Critical care provider statement:    Critical care time (minutes):  32   Critical care was necessary to treat or prevent imminent or life-threatening deterioration of the following conditions:  Respiratory failure   Critical care was time spent personally by me on the following activities:  Development of treatment plan with patient or surrogate, discussions with consultants, evaluation of patient's response to treatment, examination of patient, ordering and review of laboratory studies, ordering and review of radiographic studies, ordering and performing treatments and interventions, pulse oximetry, re-evaluation of patient's condition and review of old charts   I assumed direction of critical care for this patient from another provider in my specialty: yes     Care discussed with: admitting provider     Final Clinical Impressions(s) / ED Diagnoses     ICD-10-CM   1. Dyspnea, unspecified type  R06.00     2. AKI (acute kidney injury) (HCC)  N17.9     3. COVID  U07.1     4. Acute respiratory failure with hypoxia [facility])  J96.01       ED Discharge Orders     None       Discharge Instructions   None     Ozell HERO. Theadore, MD [facility] Emergency Medicine [facility] [email or web address]    Theadore Ozell HERO, MD 10/26/23 [id number]    Theadore Ozell HERO, MD 10/26/23 [phone]

## 2023-10-26 NOTE — ED Provider Notes (Signed)
 [place] [facility] Provider Note   CSN: [id number] Arrival date & time: 10/26/23  2050     Patient presents with: Respiratory Distress   Clifford [name] is a 78 y.o. male.  {Add pertinent medical, surgical, social history, OB history to HPI:3079} 78 year old male with prior medical history as detailed below presents for evaluation.  Patient with several days of increasing cough, congestion, shortness of breath.  He denies fever.  He reports increased difficulty with his breathing over the course of the afternoon.  He called EMS.  Patient was found to be satting 89% on room air.  Patient placed on supplemental O2 and then on CPAP during transport.  On arrival the patient is breathing comfortably.  He is placed on 5 L nasal cannula and is alert and conversant.  He denies prior history of CHF.  He denies known sick contacts.  He lives with his wife.  She apparently is not ill.  Prior medical history includes CAD, CABG, hypertension, hyperlipidemia, diabetes. Distant hx of cigarette use.   The history is provided by the patient and medical records.       Prior to Admission medications   Medication Sig Start Date End Date Taking? Authorizing Provider  Ascorbic Acid  (VITAMIN C  PO) Take 1 tablet by mouth daily.    [provider]  aspirin  EC 81 MG tablet Take 81 mg by mouth daily.    [provider]  blood glucose meter kit and supplies Dispense based on patient and insurance preference. Use up to four times daily as directed. (FOR ICD-9 250.00, 250.01). 07/14/15   [name], [name] R, PA-C  glimepiride  (AMARYL ) 2 MG tablet Take 1 tablet (2 mg total) by mouth daily with breakfast. 02/15/23   Rollene Almarie LABOR, MD  lisinopril  (ZESTRIL ) 20 MG tablet Take 1 tablet (20 mg total) by mouth daily. 04/02/23   Rollene Almarie LABOR, MD  Multiple Vitamin (MULTIVITAMIN) tablet Take 1 tablet by mouth daily.    [provider]  ONE TOUCH  ULTRA TEST test strip 1 each by Other route 2 (two) times daily. Use to check blood sugars twice a day Dx E11.9 09/12/15   Rollene Almarie LABOR, MD  [facility] DELICA LANCETS 33G MISC Use to help check blood sugars twice a day Dx e11.9 09/12/15   Rollene Almarie LABOR, MD  rosuvastatin  (CRESTOR ) 40 MG tablet Take 1 tablet (40 mg total) by mouth daily. 04/02/23   Rollene Almarie LABOR, MD    Allergies: Cinnamon    Review of Systems  All other systems reviewed and are negative.   Updated Vital Signs BP 129/83   Pulse (!) 118   Temp 98.7 F (37.1 C) (Axillary)   Resp (!) 23   SpO2 95%   Physical Exam Vitals and nursing note reviewed.  Constitutional:      General: He is not in acute distress.    Appearance: Normal appearance. He is well-developed.  HENT:     Head: Normocephalic and atraumatic.  Eyes:     Conjunctiva/sclera: Conjunctivae normal.     Pupils: Pupils are equal, round, and reactive to light.  Cardiovascular:     Rate and Rhythm: Regular rhythm. Tachycardia present.     Heart sounds: Normal heart sounds.  Pulmonary:     Effort: Respiratory distress present.     Breath sounds: Rhonchi and rales present.  Abdominal:     General: There is no distension.     Palpations: Abdomen is soft.  Tenderness: There is no abdominal tenderness.  Musculoskeletal:        General: No deformity. Normal range of motion.     Cervical back: Normal range of motion and neck supple.  Skin:    General: Skin is warm and dry.  Neurological:     General: No focal deficit present.     Mental Status: He is alert and oriented to person, place, and time.     (all labs ordered are listed, but only abnormal results are displayed) Labs Reviewed  RESP PANEL BY RT-PCR (RSV, FLU A&B, COVID)  RVPGX2  CULTURE, BLOOD (ROUTINE X 2)  CULTURE, BLOOD (ROUTINE X 2)  BRAIN NATRIURETIC PEPTIDE  COMPREHENSIVE METABOLIC PANEL WITH GFR  CBC WITH DIFFERENTIAL/PLATELET  I-STAT CHEM 8, ED  I-STAT CG4  LACTIC ACID, ED  TROPONIN I (HIGH SENSITIVITY)    EKG: None  Radiology: No results found.  {Document cardiac monitor, telemetry assessment procedure when appropriate:32947} Procedures   Medications Ordered in the ED - No data to display    {Click here for ABCD2, HEART and other calculators REFRESH Note before signing:1}                              Medical Decision Making Patient presents with increased cough, congestion, weakness times several days.  Patient was noted to be mildly hypoxic with EMS.  Patient was placed on CPAP during EMS transport.  On arrival patient feels much improved.  He is on 5 L nasal cannula and satting in the upper 90s.  He does not have a baseline O2 requirement.  Patient without recorded fever.  White count is 11.1.  Initial troponin is 38.  BNP is 112.  Creatinine is 1.7.  Baseline creatinine is 1.2-1.4.  Chest x-ray and CT chest do not show infiltrate or infection.  No pulmonary edema on imaging.  Patient would benefit from admission.  Hospitalist service made aware of case.    Amount and/or Complexity of Data Reviewed Labs: ordered. Radiology: ordered.     {Document critical care time when appropriate  Document review of labs and clinical decision tools ie CHADS2VASC2, etc  Document your independent review of radiology images and any outside records  Document your discussion with family members, caretakers and with consultants  Document social determinants of health affecting pt's care  Document your decision making why or why not admission, treatments were needed:32947:::1}   Final diagnoses:  Dyspnea, unspecified type  AKI (acute kidney injury) (HCC)  COVID    ED Discharge Orders     None

## 2023-10-26 NOTE — ED Triage Notes (Signed)
 Pt bib [facility] from home where he had been feeling sick for a couple days but got to where he couldn't breathe this evening. Pt found to be 89% on room air and placed on BIPAP. Pt does not wear oxygen at home. Placed on [place] 5L on arrival to ED. Pt has also had productive cough the last couple days. Given 1 NTG en route

## 2023-10-27 ENCOUNTER — Inpatient Hospital Stay (HOSPITAL_COMMUNITY)

## 2023-10-27 ENCOUNTER — Encounter (HOSPITAL_COMMUNITY): Payer: Self-pay | Admitting: Internal Medicine

## 2023-10-27 DIAGNOSIS — Z9841 Cataract extraction status, right eye: Secondary | ICD-10-CM | POA: Diagnosis not present

## 2023-10-27 DIAGNOSIS — J9601 Acute respiratory failure with hypoxia: Secondary | ICD-10-CM | POA: Diagnosis present

## 2023-10-27 DIAGNOSIS — Z9079 Acquired absence of other genital organ(s): Secondary | ICD-10-CM | POA: Diagnosis not present

## 2023-10-27 DIAGNOSIS — U071 COVID-19: Secondary | ICD-10-CM | POA: Diagnosis present

## 2023-10-27 DIAGNOSIS — Z83719 Family history of colon polyps, unspecified: Secondary | ICD-10-CM | POA: Diagnosis not present

## 2023-10-27 DIAGNOSIS — E785 Hyperlipidemia, unspecified: Secondary | ICD-10-CM | POA: Diagnosis present

## 2023-10-27 DIAGNOSIS — I10 Essential (primary) hypertension: Secondary | ICD-10-CM | POA: Diagnosis not present

## 2023-10-27 DIAGNOSIS — Z8249 Family history of ischemic heart disease and other diseases of the circulatory system: Secondary | ICD-10-CM | POA: Diagnosis not present

## 2023-10-27 DIAGNOSIS — J1282 Pneumonia due to coronavirus disease 2019: Secondary | ICD-10-CM | POA: Diagnosis present

## 2023-10-27 DIAGNOSIS — Z833 Family history of diabetes mellitus: Secondary | ICD-10-CM | POA: Diagnosis not present

## 2023-10-27 DIAGNOSIS — Z8546 Personal history of malignant neoplasm of prostate: Secondary | ICD-10-CM | POA: Diagnosis not present

## 2023-10-27 DIAGNOSIS — Z7984 Long term (current) use of oral hypoglycemic drugs: Secondary | ICD-10-CM | POA: Diagnosis not present

## 2023-10-27 DIAGNOSIS — R7989 Other specified abnormal findings of blood chemistry: Secondary | ICD-10-CM | POA: Diagnosis not present

## 2023-10-27 DIAGNOSIS — M25561 Pain in right knee: Secondary | ICD-10-CM | POA: Diagnosis present

## 2023-10-27 DIAGNOSIS — E1122 Type 2 diabetes mellitus with diabetic chronic kidney disease: Secondary | ICD-10-CM | POA: Diagnosis present

## 2023-10-27 DIAGNOSIS — Z961 Presence of intraocular lens: Secondary | ICD-10-CM | POA: Diagnosis present

## 2023-10-27 DIAGNOSIS — I5A Non-ischemic myocardial injury (non-traumatic): Secondary | ICD-10-CM | POA: Diagnosis present

## 2023-10-27 DIAGNOSIS — Z951 Presence of aortocoronary bypass graft: Secondary | ICD-10-CM | POA: Diagnosis not present

## 2023-10-27 DIAGNOSIS — I2510 Atherosclerotic heart disease of native coronary artery without angina pectoris: Secondary | ICD-10-CM | POA: Diagnosis present

## 2023-10-27 DIAGNOSIS — I129 Hypertensive chronic kidney disease with stage 1 through stage 4 chronic kidney disease, or unspecified chronic kidney disease: Secondary | ICD-10-CM | POA: Diagnosis present

## 2023-10-27 DIAGNOSIS — N1831 Chronic kidney disease, stage 3a: Secondary | ICD-10-CM | POA: Diagnosis present

## 2023-10-27 DIAGNOSIS — T380X5A Adverse effect of glucocorticoids and synthetic analogues, initial encounter: Secondary | ICD-10-CM | POA: Diagnosis not present

## 2023-10-27 DIAGNOSIS — N179 Acute kidney failure, unspecified: Secondary | ICD-10-CM | POA: Diagnosis present

## 2023-10-27 DIAGNOSIS — Z7982 Long term (current) use of aspirin: Secondary | ICD-10-CM | POA: Diagnosis not present

## 2023-10-27 DIAGNOSIS — Z87891 Personal history of nicotine dependence: Secondary | ICD-10-CM | POA: Diagnosis not present

## 2023-10-27 DIAGNOSIS — Z23 Encounter for immunization: Secondary | ICD-10-CM | POA: Diagnosis not present

## 2023-10-27 LAB — GLUCOSE, CAPILLARY
Glucose-Capillary: 290 mg/dL — ABNORMAL HIGH (ref 70–99)
Glucose-Capillary: 222 mg/dL — ABNORMAL HIGH (ref 70–99)
Glucose-Capillary: 319 mg/dL — ABNORMAL HIGH (ref 70–99)

## 2023-10-27 LAB — CBC
HCT: 46.6 % (ref 39.0–52.0)
Hemoglobin: 16 g/dL (ref 13.0–17.0)
MCH: 30.8 pg (ref 26.0–34.0)
MCHC: 34.3 g/dL (ref 30.0–36.0)
MCV: 89.8 fL (ref 80.0–100.0)
Platelets: 166 K/uL (ref 150–400)
RBC: 5.19 MIL/uL (ref 4.22–5.81)
RDW: 12.7 % (ref 11.5–15.5)
WBC: 10.6 K/uL — ABNORMAL HIGH (ref 4.0–10.5)
nRBC: 0 % (ref 0.0–0.2)

## 2023-10-27 LAB — BASIC METABOLIC PANEL WITH GFR
Anion gap: 14 (ref 5–15)
BUN: 25 mg/dL — ABNORMAL HIGH (ref 8–23)
CO2: 19 mmol/L — ABNORMAL LOW (ref 22–32)
Calcium: 8.5 mg/dL — ABNORMAL LOW (ref 8.9–10.3)
Chloride: 104 mmol/L (ref 98–111)
Creatinine, Ser: 1.82 mg/dL — ABNORMAL HIGH (ref 0.61–1.24)
GFR, Estimated: 37 mL/min — ABNORMAL LOW (ref >60)
Glucose, Bld: 215 mg/dL — ABNORMAL HIGH (ref 70–99)
Potassium: 3.7 mmol/L (ref 3.5–5.1)
Sodium: 137 mmol/L (ref 135–145)

## 2023-10-27 LAB — HEPATIC FUNCTION PANEL
ALT: 41 U/L (ref 0–44)
AST: 86 U/L — ABNORMAL HIGH (ref 15–41)
Albumin: 3.4 g/dL — ABNORMAL LOW (ref 3.5–5.0)
Alkaline Phosphatase: 35 U/L — ABNORMAL LOW (ref 38–126)
Bilirubin, Direct: 0.2 mg/dL (ref 0.0–0.2)
Indirect Bilirubin: 0.9 mg/dL (ref 0.3–0.9)
Total Bilirubin: 1.1 mg/dL (ref 0.0–1.2)
Total Protein: 6.6 g/dL (ref 6.5–8.1)

## 2023-10-27 LAB — D-DIMER, QUANTITATIVE: D-Dimer, Quant: 3.45 ug{FEU}/mL — ABNORMAL HIGH (ref 0.00–0.50)

## 2023-10-27 LAB — MAGNESIUM: Magnesium: 1.9 mg/dL (ref 1.7–2.4)

## 2023-10-27 LAB — PHOSPHORUS: Phosphorus: 3.9 mg/dL (ref 2.5–4.6)

## 2023-10-27 MED ORDER — ADULT MULTIVITAMIN W/MINERALS CH
1.0000 | ORAL_TABLET | Freq: Every day | ORAL | Status: DC
  Administered 2023-10-27 – 2023-10-29 (×3): 1 via ORAL
  Filled 2023-10-27 (×3): qty 1

## 2023-10-27 MED ORDER — INSULIN ASPART 100 UNIT/ML IJ SOLN
0.0000 [IU] | Freq: Three times a day (TID) | INTRAMUSCULAR | Status: DC
  Administered 2023-10-27: 3 [IU] via SUBCUTANEOUS
  Administered 2023-10-27: 4 [IU] via SUBCUTANEOUS
  Administered 2023-10-27: 2 [IU] via SUBCUTANEOUS
  Administered 2023-10-28: 3 [IU] via SUBCUTANEOUS
  Administered 2023-10-28: 5 [IU] via SUBCUTANEOUS

## 2023-10-27 MED ORDER — HEPARIN SODIUM (PORCINE) 5000 UNIT/ML IJ SOLN
5000.0000 [IU] | Freq: Three times a day (TID) | INTRAMUSCULAR | Status: DC
  Administered 2023-10-27 – 2023-10-29 (×7): 5000 [IU] via SUBCUTANEOUS
  Filled 2023-10-27 (×7): qty 1

## 2023-10-27 MED ORDER — MELATONIN 3 MG PO TABS
6.0000 mg | ORAL_TABLET | Freq: Every evening | ORAL | Status: DC | PRN
  Filled 2023-10-27 (×2): qty 2

## 2023-10-27 MED ORDER — IPRATROPIUM-ALBUTEROL 0.5-2.5 (3) MG/3ML IN SOLN
3.0000 mL | Freq: Four times a day (QID) | RESPIRATORY_TRACT | Status: DC
  Administered 2023-10-27 – 2023-10-28 (×6): 3 mL via RESPIRATORY_TRACT
  Filled 2023-10-27 (×6): qty 3

## 2023-10-27 MED ORDER — DEXAMETHASONE 6 MG PO TABS
6.0000 mg | ORAL_TABLET | Freq: Every day | ORAL | Status: DC
  Administered 2023-10-27 – 2023-10-28 (×2): 6 mg via ORAL
  Filled 2023-10-27 (×2): qty 1

## 2023-10-27 MED ORDER — ROSUVASTATIN CALCIUM 20 MG PO TABS
40.0000 mg | ORAL_TABLET | Freq: Every day | ORAL | Status: DC
  Administered 2023-10-27 – 2023-10-29 (×3): 40 mg via ORAL
  Filled 2023-10-27 (×3): qty 2

## 2023-10-27 MED ORDER — ASPIRIN 81 MG PO TBEC
81.0000 mg | DELAYED_RELEASE_TABLET | Freq: Every day | ORAL | Status: DC
  Administered 2023-10-27 – 2023-10-29 (×3): 81 mg via ORAL
  Filled 2023-10-27 (×3): qty 1

## 2023-10-27 MED ORDER — SODIUM CHLORIDE 0.9 % IV SOLN
200.0000 mg | Freq: Once | INTRAVENOUS | Status: AC
  Administered 2023-10-27: 200 mg via INTRAVENOUS
  Filled 2023-10-27 (×5): qty 40

## 2023-10-27 MED ORDER — TECHNETIUM TO 99M ALBUMIN AGGREGATED
4.2600 | Freq: Once | INTRAVENOUS | Status: AC | PRN
  Administered 2023-10-27: 4.26 via INTRAVENOUS

## 2023-10-27 MED ORDER — POLYETHYLENE GLYCOL 3350 17 G PO PACK: 17.0000 g | PACK | Freq: Every day | ORAL | Status: DC | PRN

## 2023-10-27 MED ORDER — ACETAMINOPHEN 500 MG PO TABS: 1000.0000 mg | ORAL_TABLET | Freq: Four times a day (QID) | ORAL | Status: DC | PRN

## 2023-10-27 MED ORDER — SODIUM CHLORIDE 0.9 % IV SOLN
100.0000 mg | Freq: Every day | INTRAVENOUS | Status: AC
  Administered 2023-10-28 – 2023-10-29 (×2): 100 mg via INTRAVENOUS
  Filled 2023-10-27 (×2): qty 20

## 2023-10-27 MED ORDER — ONDANSETRON HCL 4 MG/2ML IJ SOLN: 4.0000 mg | Freq: Four times a day (QID) | INTRAMUSCULAR | Status: DC | PRN

## 2023-10-27 MED ORDER — ALBUTEROL SULFATE (2.5 MG/3ML) 0.083% IN NEBU: 2.5000 mg | INHALATION_SOLUTION | RESPIRATORY_TRACT | Status: DC | PRN

## 2023-10-27 MED ORDER — SODIUM CHLORIDE 0.9% FLUSH
3.0000 mL | Freq: Two times a day (BID) | INTRAVENOUS | Status: DC
  Administered 2023-10-27 – 2023-10-29 (×5): 3 mL via INTRAVENOUS

## 2023-10-27 NOTE — H&P (Signed)
 History and Physical    GERMAINE [name] FMW:[id number] DOB: May 20, 1945 DOA: 10/26/2023  PCP: Rollene Almarie LABOR, MD   Patient coming from: Home   Chief Complaint:  Chief Complaint  Patient presents with   Respiratory Distress    HPI:  Clifford [name] is a 78 y.o. male with hx of CAD s/p multiple PCI, 3v CABG, carotid artery disease, diabetes, HTN, HLD, prostate CA status post prostatectomy, who presented with progressive SOB and weakness. Reports that symptoms began on Thursday 8/28 with sore throat. Then developing progressive cough and significant purulent sputum, progressive SOB. He has been weak and unable to transfer and get around as he normally would. Denies any chest pain. Has had some recent pain in the right groin, and in the R knee. No asymmetric swelling. He has never had COVID in the past. Has been vaccinated with initial series.    Review of Systems:  ROS complete and negative except as marked above   Allergies  Allergen Reactions   Cinnamon Other (See Comments)    Stomach ache     Prior to Admission medications   Medication Sig Start Date End Date Taking? Authorizing Provider  Ascorbic Acid  (VITAMIN C  PO) Take 1 tablet by mouth daily.    [provider]  aspirin  EC 81 MG tablet Take 81 mg by mouth daily.    [provider]  blood glucose meter kit and supplies Dispense based on patient and insurance preference. Use up to four times daily as directed. (FOR ICD-9 250.00, 250.01). 07/14/15   [name], [name] R, PA-C  glimepiride  (AMARYL ) 2 MG tablet Take 1 tablet (2 mg total) by mouth daily with breakfast. 02/15/23   Rollene Almarie LABOR, MD  lisinopril  (ZESTRIL ) 20 MG tablet Take 1 tablet (20 mg total) by mouth daily. 04/02/23   Rollene Almarie LABOR, MD  Multiple Vitamin (MULTIVITAMIN) tablet Take 1 tablet by mouth daily.    [provider]  ONE TOUCH ULTRA TEST test strip 1 each by Other route 2 (two) times daily. Use to check blood sugars  twice a day Dx E11.9 09/12/15   Rollene Almarie LABOR, MD  [facility] DELICA LANCETS 33G MISC Use to help check blood sugars twice a day Dx e11.9 09/12/15   Rollene Almarie LABOR, MD  rosuvastatin  (CRESTOR ) 40 MG tablet Take 1 tablet (40 mg total) by mouth daily. 04/02/23   Rollene Almarie LABOR, MD    Past Medical History:  Diagnosis Date   CAD (coronary artery disease) 02/27/2003   a. Previous stents RCA and circ, Cath 2005 patient with 70% PDA  //  b. [place]  5/17 >> LHC oLCx 50, pLAD 50, mLAD 95, RPDA 80, normal LVEF >> s/p CABG   Cancer (HCC)    Carotid artery disease (HCC)    a. Carotid US  5/17 bilat ICA 1-39%   Cataract    replace 11/2012   Diabetes mellitus    diet controlled   History of detached retina repair 11/06/2006   [facility]   History of echocardiogram    a. EF 55% to 60%. Wall motion was normal, Gr 1 DD, Trivial MR/TR   History of hematuria    History of prostate cancer 03/29/2005   s/p prostatectomy   Hyperlipidemia    Hypertension     Past Surgical History:  Procedure Laterality Date   ANGIOPLASTY     1995,2000,2002   CARDIAC CATHETERIZATION  07/26/2000   Initially the stenosis in the proximal to mid right coronary artery was  estimated ar 95%. Following stenting, this improved to 0%. There was residual mild disease in the sostium of the proximal right coronary atrtery. Successful stentng of the proximal to mid right coronary artery with improvement in stent diameter narrowing from 95% to 0% Proc Date  07/29/2000   CARDIAC CATHETERIZATION N/A 07/04/2015   Procedure: Left Heart Cath and Coronary Angiography;  Surgeon: Candyce GORMAN Reek, MD;  Location: [facility] INVASIVE CV LAB;  Service: Cardiovascular;  Laterality: N/A;   CATARACT EXTRACTION W/ INTRAOCULAR LENS IMPLANT Bilateral OS Sept '14; OD Oct '14   Dr. camillo   COLONOSCOPY  04/12/2015   Dr.Gessner   CORONARY ARTERY BYPASS GRAFT N/A 07/08/2015   Procedure: CORONARY ARTERY BYPASS GRAFTING (CABG)x three using left  internal mammary artery to left anterior decending artery, left greater saphenous vein grafts to diagonal and posterolateral. Left leg greater saphenous leg vein via endoscope. ;  Surgeon: Dallas KATHEE Jude, MD;  Location: [facility] OR;  Service: Open Heart Surgery;  Laterality: N/A;   CYSTOSCOPY N/A 07/08/2015   Procedure: CYSTOSCOPY FLEXIBLE WITH BALLOON DILATION OF BLADDER NECK CONTRACTURE WITH DIFFICULT CATHETER PLACEMENT;  Surgeon: Gretel Ferrara, MD;  Location: [facility] OR;  Service: Urology;  Laterality: N/A;   EYE SURGERY  11/24/2012   left eye cataract   EYE SURGERY  12/22/2012   right eye cataract   HEMORRHOID SURGERY     I&D of thrombosed hemorrhoid   LIPOMA EXCISION     POLYPECTOMY     PROSTATECTOMY  03/29/2005   PTCA  02/27/2003   TEE WITHOUT CARDIOVERSION N/A 07/08/2015   Procedure: TRANSESOPHAGEAL ECHOCARDIOGRAM (TEE);  Surgeon: Dallas KATHEE Jude, MD;  Location: [facility] OR;  Service: Open Heart Surgery;  Laterality: N/A;   WRIST GANGLION EXCISION       reports that he quit smoking about 40 years ago. His smoking use included cigarettes. He has never used smokeless tobacco. He reports that he does not drink alcohol  and does not use drugs.  Family History  Problem Relation Age of Onset   CAD Father    Diabetes Sister    Colon polyps Sister    Cancer Neg Hx        Colon   Colon cancer Neg Hx    Esophageal cancer Neg Hx    Rectal cancer Neg Hx    Stomach cancer Neg Hx      Physical Exam: Vitals:   10/26/23 2330 10/27/23 0000 10/27/23 0045 10/27/23 0101  BP: (!) 140/75 (!) 144/76 (!) 153/70   Pulse: 95 96 (!) 103   Resp: (!) 24 (!) 23 (!) 27   Temp:    98 F (36.7 C)  TempSrc:    Oral  SpO2: 98% 97% 96%     Gen: Awake, alert, NAD   CV: Regular, normal S1, S2, no murmurs  Resp: Tachypneic, mild increased WOB, there is copious upper airway secretions, purulent sputum. Exam limited due to the significant upper airway referred noise.   Abd: Flat, normoactive, nontender MSK:  Symmetric, no edema  Skin: No rashes or lesions to exposed skin  Neuro: Alert and interactive  Psych: euthymic, appropriate    Data review:   Labs reviewed, notable for:   Cr 1.7 (b/l 1.4 )  Bicarb 22, AG 14  AST 83, other LFT wnl  BNP 112  HS trop 38 -> 34  Lactate 1.6 -> 1  WBC 11  Ddimer 3.45  COVID +   Micro:  Results for orders placed or performed during the hospital  encounter of 10/26/23  Resp panel by RT-PCR (RSV, Flu A&B, Covid) Anterior Nasal Swab     Status: Abnormal   Collection Time: 10/26/23  9:24 PM   Specimen: Anterior Nasal Swab  Result Value Ref Range Status   SARS Coronavirus 2 by RT PCR POSITIVE (A) NEGATIVE Final   Influenza A by PCR NEGATIVE NEGATIVE Final   Influenza B by PCR NEGATIVE NEGATIVE Final    Comment: (NOTE) The Xpert Xpress SARS-CoV-2/FLU/RSV plus assay is intended as an aid in the diagnosis of influenza from Nasopharyngeal swab specimens and should not be used as a sole basis for treatment. Nasal washings and aspirates are unacceptable for Xpert Xpress SARS-CoV-2/FLU/RSV testing.  Fact Sheet for Patients: BloggerCourse.com  Fact Sheet for Healthcare Providers: SeriousBroker.it  This test is not yet approved or cleared by the [place]  FDA and has been authorized for detection and/or diagnosis of SARS-CoV-2 by FDA under an Emergency Use Authorization (EUA). This EUA will remain in effect (meaning this test can be used) for the duration of the COVID-19 declaration under Section 564(b)(1) of the Act, 21 U.S.C. section 360bbb-3(b)(1), unless the authorization is terminated or revoked.     Resp Syncytial Virus by PCR NEGATIVE NEGATIVE Final    Comment: (NOTE) Fact Sheet for Patients: BloggerCourse.com  Fact Sheet for Healthcare Providers: SeriousBroker.it  This test is not yet approved or cleared by the [place]  FDA and has  been authorized for detection and/or diagnosis of SARS-CoV-2 by FDA under an Emergency Use Authorization (EUA). This EUA will remain in effect (meaning this test can be used) for the duration of the COVID-19 declaration under Section 564(b)(1) of the Act, 21 U.S.C. section 360bbb-3(b)(1), unless the authorization is terminated or revoked.  Performed at [facility], [address] [place], [place] [address]     Imaging reviewed:  CT CHEST WO CONTRAST Result Date: 10/26/2023 CLINICAL DATA:  Dyspnea EXAM: CT CHEST WITHOUT CONTRAST TECHNIQUE: Multidetector CT imaging of the chest was performed following the standard protocol without IV contrast. RADIATION DOSE REDUCTION: This exam was performed according to the departmental dose-optimization program which includes automated exposure control, adjustment of the mA and/or kV according to patient size and/or use of iterative reconstruction technique. COMPARISON:  None Available. FINDINGS: Cardiovascular: Status post coronary artery bypass grafting. Global cardiac size within normal limits. No pericardial effusion. Central pulmonary arteries are of normal caliber. Mild atherosclerotic calcification within the thoracic aorta. No aortic aneurysm. Mediastinum/Nodes: No enlarged mediastinal or axillary lymph nodes. Thyroid  gland, trachea, and esophagus demonstrate no significant findings. Lungs/Pleura: Trace right pleural effusion. Mild bibasilar dependent atelectasis. Lungs are otherwise clear. No pneumothorax. No central obstructing lesion. Respiratory motion artifact noted. Upper Abdomen: Moderate hepatic steatosis. Simple exophytic cortical cyst arises from the upper pole the right kidney measuring at least 9 cm in greatest dimension. No acute abnormality. 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 Osseous structures are otherwise age-appropriate. No acute bone abnormality. IMPRESSION: 1. No acute intrathoracic pathology identified.  2. Moderate hepatic steatosis. Aortic Atherosclerosis (ICD10-I70.0). Electronically Signed   By: Dorethia Molt M.D.   On: 10/26/2023 22:11   DG Chest Port 1 View Result Date: 10/26/2023 CLINICAL DATA:  Cough with shortness of breath EXAM: PORTABLE CHEST 1 VIEW COMPARISON:  Chest x-ray 09/16/2021 FINDINGS: Cardiomediastinal silhouette is stable, heart is mildly enlarged. Sternotomy wires are present. The lungs are clear. There is no pleural effusion or pneumothorax. No acute fractures are seen. IMPRESSION: No active disease. Mild cardiomegaly. Electronically  Signed   By: Greig Pique M.D.   On: 10/26/2023 21:17    EKG:  Personally reviewed,   SR with PVCs, PRWP, repolarization changes anteriorly.   ED Course:  Treated with Dexamethasone  6 mg IV, and NS 1L.    Assessment/Plan:  78 y.o. male with hx CAD s/p multiple PCI, 3v CABG, carotid artery disease, diabetes, HTN, HLD, prostate CA status post prostatectomy, who presented with progressive SOB, and found to have AHRF with respiratory distress, initially requiring CPAP. Positive for COVID 19 although chest imaging with no significant airspace disease.   AHRF, requiring 5L O2  COVID 19 pneumonia, with hypoxia  P/w progressive cough, copious sputum,  SOB, weakness; onset 8/28. Culminating in resp distress. On EMS arrival sat 89% on RA, initially requiring CPAP and transitioned off in ED to 5L  O2. Exam with copious purulent sputum + upper airway secretion. WBC 11. D dimer + 3.45. BNP 112, HS trop 38 -> 34. CT Chest without with no significant airspace disease. Despite this clinically has impressive airway secretions. With his Ddimer would also like to r/o VTE.  -- Continue Dexamethasone  6 mg PO daily  -- Start on Remdesivir  200 mg IV x 1, then 100 mg IV x 2 additional days  (minimal elevation in AST, does not preclude use, can monitor LFT)  -- Scheduled duonebs, albuterol  prn, IS, flutter valve, chest PT, yankauer for suctioning, and will have  RT see for suctioning as well.  -- encourage OOB to chair   + Ddimer  Recent R groin / knee pain  -- Obtain VQ scan once resp status stabilizes  -- Venous duplex RLE  -- For now just DVT ppx dosing heparin    Acute myocardial injury  HS trop low level elevated and downtrending 38 -> 34, EKG without acute ischemic changes. Likely demand in setting of respiratory failure -- OK to stop trop trend  -- Management directed at respiratory failure per above  -- Check lipids and A1c   Minimal AST elevation  Likely related to viral infection  -- Trend LFT   Chronic medical problems:  CAD s/p multiple PCI, 3v CABG: Continue home aspirin , rosuvastatin   carotid artery disease: See CAD  CKD stage 3a: Baseline Cr near 1.4. Up to 1.7 on admission, likely prerenal component  Diabetes type 2: Hold home Glipizide, SSI for very sensitive  HTN: Hold home Lisinopril   HLD: See CAD  prostate CA s/p prostatectomy: outpatient f/u   There is no height or weight on file to calculate BMI.    DVT prophylaxis:  SQ heparin   Code Status:  Full Code Diet:  Diet Orders (From admission, onward)    None      Family Communication:  Yes discussed with wife at the bedside   Consults:  None   Admission status:   Inpatient, Step Down Unit  Severity of Illness: The appropriate patient status for this patient is INPATIENT. Inpatient status is judged to be reasonable and necessary in order to provide the required intensity of service to ensure the patient's safety. The patient's presenting symptoms, physical exam findings, and initial radiographic and laboratory data in the context of their chronic comorbidities is felt to place them at high risk for further clinical deterioration. Furthermore, it is not anticipated that the patient will be medically stable for discharge from the hospital within 2 midnights of admission.   * I certify that at the point of admission it is my clinical judgment that the patient will  require inpatient hospital care spanning beyond 2 midnights from the point of admission due to high intensity of service, high risk for further deterioration and high frequency of surveillance required.*   Dorn Dawson, MD [facility]  How to contact the [facility] Attending or Consulting provider 7A - 7P or covering provider during after hours 7P -7A, for this patient.  Check the care team in [facility] and look for a) attending/consulting [facility] provider listed and b) the [facility] team listed Log into [email or web address] and use [place]'s universal password to access. If you do not have the password, please contact the hospital operator. Locate the [facility] provider you are looking for under [facility] and page to a number that you can be directly reached. If you still have difficulty reaching the provider, please page the [facility] (Director on Call) for the Hospitalists listed on amion for assistance.  10/27/2023, 1:13 AM

## 2023-10-27 NOTE — Evaluation (Signed)
 Physical Therapy Evaluation Patient Details Name: Clifford [name] MRN: [id number] DOB: Jul 12, 1945 Today's Date: 10/27/2023  History of Present Illness  The pt is an 78 yo male presenting 8/30 with SpO2 89% on RA and SOB, wife also reports pt sliding off couch x2 in last day and unable to get back up. Work up revealed COVID +, Ddimer +, and mild troponin elevation. PMH in cludes: CAD s/p CABG x3, DM II, HTN, HLD, prostate cancer.   Clinical Impression  Pt in bed upon arrival of PT, agreeable to evaluation at this time. Prior to admission the pt was independent with all mobility, but reports largely sedentary in recliner during the day other than necessary mobility in the house (bathroom and kitchen). The pt presents with deficits in LE strength, dynamic stability, and endurance, needing minA with use of RW to complete short bout of ambulation in the room due to intermittent tremors in BUE and buckling in knees withg gait. SpO2 stable on RA (>94%) with all mobility. Pt will benefit from continued skilled PT acutely and after d/c to improve functional endurance and dynamic stability to return to baseline level of independence without need for DME. Wife present and in agreement with plan.      If plan is discharge home, recommend the following: A little help with walking and/or transfers;A little help with bathing/dressing/bathroom;Assist for transportation;Help with stairs or ramp for entrance   Can travel by private vehicle        Equipment Recommendations Rolling walker (2 wheels);BSC/3in1  Recommendations for Other Services       Functional Status Assessment Patient has had a recent decline in their functional status and demonstrates the ability to make significant improvements in function in a reasonable and predictable amount of time.     Precautions / Restrictions Precautions Precautions: Fall Recall of Precautions/Restrictions: Intact Restrictions Weight Bearing Restrictions Per  Provider Order: No      Mobility  Bed Mobility               General bed mobility comments: OOB in chair at start and end of session    Transfers Overall transfer level: Needs assistance Equipment used: Rolling walker (2 wheels) Transfers: Sit to/from Stand Sit to Stand: Min assist, Contact guard assist           General transfer comment: progressed in session from minA to CGA, cues for hand placement on armrests.    Ambulation/Gait Ambulation/Gait assistance: Min assist Gait Distance (Feet): 35 Feet Assistive device: Rolling walker (2 wheels) Gait Pattern/deviations: Step-through pattern, Decreased stride length, Knees buckling, Trunk flexed       General Gait Details: pt with intermittent knee buckling due to tremors but able to correct with minA and BUE support on RW. VSS on RA     Balance Overall balance assessment: Needs assistance Sitting-balance support: No upper extremity supported, Feet supported Sitting balance-Leahy Scale: Fair     Standing balance support: No upper extremity supported Standing balance-Leahy Scale: Poor Standing balance comment: dependent on BUE support with gait due to tremors                             Pertinent Vitals/Pain Pain Assessment Pain Assessment: No/denies pain    Home Living Family/patient expects to be discharged to:: Private residence Living Arrangements: Spouse/significant other Available Help at Discharge: Family;Available 24 hours/day Type of Home: House Home Access: Level entry       Home  Layout: Able to live on main level with bedroom/bathroom;Laundry or work area in [employer] Equipment: Scientist, research (medical) (4 wheels);Shower seat      Prior Function Prior Level of Function : Independent/Modified Independent;Driving;History of Falls (last six months) (only 2 just prior to admission, no other falls)             Mobility Comments: no DME, 2 falls sliding off couch in day  prior to admission. spends most of the day in  his chair, but can ambulate in the house as needed ADLs Comments: independent, manages his own meds     Extremity/Trunk Assessment   Upper Extremity Assessment Upper Extremity Assessment: Overall WFL for tasks assessed (tremors with movement and intermittently while standing, able to maintain grip on RW)    Lower Extremity Assessment Lower Extremity Assessment: Overall WFL for tasks assessed (generally 4/5 to MMT, limited power as pt dependent on UE support to rise to standing. no change in sensation)    Cervical / Trunk Assessment Cervical / Trunk Assessment: Normal  Communication   Communication Communication: No apparent difficulties    Cognition Arousal: Alert Behavior During Therapy: WFL for tasks assessed/performed   PT - Cognitive impairments: No apparent impairments                       PT - Cognition Comments: pt following commands well in session, not formally assessed Following commands: Intact       Cueing Cueing Techniques: Verbal cues     General Comments General comments (skin integrity, edema, etc.): VSS on RA    Exercises     Assessment/Plan    PT Assessment Patient needs continued PT services  PT Problem List Decreased strength;Decreased activity tolerance;Decreased balance;Decreased mobility       PT Treatment Interventions DME instruction;Gait training;Stair training;Functional mobility training;Therapeutic activities;Therapeutic exercise;Balance training;Patient/family education    PT Goals (Current goals can be found in the Care Plan section)  Acute Rehab PT Goals Patient Stated Goal: to return to independence PT Goal Formulation: With patient Time For Goal Achievement: 11/10/23 Potential to Achieve Goals: Good    Frequency Min 2X/week        AM-PAC PT 6 Clicks Mobility  Outcome Measure Help needed turning from your back to your side while in a flat bed without using  bedrails?: A Little Help needed moving from lying on your back to sitting on the side of a flat bed without using bedrails?: A Little Help needed moving to and from a bed to a chair (including a wheelchair)?: A Little Help needed standing up from a chair using your arms (e.g., wheelchair or bedside chair)?: A Little Help needed to walk in hospital room?: A Little Help needed climbing 3-5 steps with a railing? : A Lot 6 Click Score: 17    End of Session Equipment Utilized During Treatment: Gait belt Activity Tolerance: Patient tolerated treatment well Patient left: in chair;with call bell/phone within reach;with chair alarm set;with family/visitor present Nurse Communication: Mobility status PT Visit Diagnosis: Unsteadiness on feet (R26.81);Other abnormalities of gait and mobility (R26.89);Muscle weakness (generalized) (M62.81)    Time: 8344-8270 PT Time Calculation (min) (ACUTE ONLY): 34 min   Charges:   PT Evaluation $PT Eval Moderate Complexity: 1 Mod PT Treatments $Therapeutic Exercise: 8-22 mins PT General Charges $$ ACUTE PT VISIT: 1 Visit         Izetta Call, PT, DPT   Acute Rehabilitation Department Office [phone] Secure Chat Communication Preferred  Izetta JULIANNA Call 10/27/2023, 5:42 PM

## 2023-10-27 NOTE — Progress Notes (Signed)
 Courtesy visit No billing-  Patient is seen and examined today morning. He is admitted for evaluation of respiratory failure due to COVID infection. He feels better today, still on 3L supplemental oxygen. Appetite poor, did not get out of bed.  Plan to continue Remdesivir , steroids, supportive care with nebulization, IS, flutter valve. Continue isolation precautions. PT/ OT evaluation, encourage out of bed to chair. Further management per clinical course.

## 2023-10-28 DIAGNOSIS — R7989 Other specified abnormal findings of blood chemistry: Secondary | ICD-10-CM | POA: Diagnosis not present

## 2023-10-28 DIAGNOSIS — U071 COVID-19: Secondary | ICD-10-CM | POA: Diagnosis not present

## 2023-10-28 DIAGNOSIS — I10 Essential (primary) hypertension: Secondary | ICD-10-CM

## 2023-10-28 DIAGNOSIS — N1831 Chronic kidney disease, stage 3a: Secondary | ICD-10-CM

## 2023-10-28 DIAGNOSIS — J9601 Acute respiratory failure with hypoxia: Secondary | ICD-10-CM | POA: Diagnosis not present

## 2023-10-28 DIAGNOSIS — E1122 Type 2 diabetes mellitus with diabetic chronic kidney disease: Secondary | ICD-10-CM

## 2023-10-28 LAB — GLUCOSE, CAPILLARY
Glucose-Capillary: 369 mg/dL — ABNORMAL HIGH (ref 70–99)
Glucose-Capillary: 268 mg/dL — ABNORMAL HIGH (ref 70–99)
Glucose-Capillary: 386 mg/dL — ABNORMAL HIGH (ref 70–99)
Glucose-Capillary: 370 mg/dL — ABNORMAL HIGH (ref 70–99)
Glucose-Capillary: 213 mg/dL — ABNORMAL HIGH (ref 70–99)

## 2023-10-28 MED ORDER — INSULIN ASPART 100 UNIT/ML IJ SOLN
0.0000 [IU] | Freq: Three times a day (TID) | INTRAMUSCULAR | Status: DC
  Administered 2023-10-28: 15 [IU] via SUBCUTANEOUS
  Administered 2023-10-29: 5 [IU] via SUBCUTANEOUS
  Administered 2023-10-29: 3 [IU] via SUBCUTANEOUS

## 2023-10-28 MED ORDER — INSULIN ASPART 100 UNIT/ML IJ SOLN
0.0000 [IU] | Freq: Every day | INTRAMUSCULAR | Status: DC
  Administered 2023-10-28: 2 [IU] via SUBCUTANEOUS

## 2023-10-28 MED ORDER — DEXAMETHASONE 2 MG PO TABS
2.0000 mg | ORAL_TABLET | Freq: Every day | ORAL | Status: DC
  Administered 2023-10-29: 2 mg via ORAL
  Filled 2023-10-28: qty 1

## 2023-10-28 NOTE — Progress Notes (Signed)
 Progress Note   Patient: Clifford [name] FMW:[id number] DOB: Dec 10, 1945 DOA: 10/26/2023     1 DOS: the patient was seen and examined on 10/28/2023   Brief hospital course: Clifford [name] is a 78 y.o. male with hx of CAD s/p multiple PCI, 3v CABG, carotid artery disease, diabetes, HTN, HLD, prostate CA status post prostatectomy, who presented with progressive SOB and weakness, cough. He was tested positive for COVID admitted to hospitalist service for further management.  Assessment and Plan: Acute hypoxic respiratory failure COVID 19 pneumonia- Will taper Dexamethasone  as sugars high. Started on Remdesivir  200 mg IV x 1, continue 100 mg IV x 2 additional days. Continue Scheduled duonebs, albuterol  prn, IS, flutter valve, chest PT. Encourage OOB to chair, PT/ OT. Wean O2 as able.   + Ddimer  Recent R groin / knee pain  VQ scan low risk for PE. Venous duplex RLE pending. Continue DVT ppx dosing heparin     CAD s/p multiple PCI, 3v CABG: Continue home aspirin , rosuvastatin   HS trop low level elevated and downtrending 38 -> 34, EKG without acute ischemic changes. Likely demand in setting of respiratory failure Management directed at respiratory failure per above   Minimal AST elevation  Likely related to viral infection  Trend LFT    CKD stage 3a: Baseline Cr near 1.4. Up to 1.7 on admission, likely prerenal component.   Diabetes type 2: Hold home Glipizide, accuchecks, sliding scale per protocol.   HTN: Hold home Lisinopril .  HLD: continue statin.  Prostate CA s/p prostatectomy: outpatient f/u    Out of bed to chair. Incentive spirometry. Nursing supportive care. Fall, aspiration precautions. Diet:  Diet Orders (From admission, onward)     Start     Ordered   10/27/23 0140  Diet Carb Modified Fluid consistency: Thin; Room service appropriate? Yes  Diet effective now       Question Answer Comment  Diet-HS Snack? Nothing   Calorie Level Medium 1600-2000   Fluid  consistency: Thin   Room service appropriate? Yes      10/27/23 0142           DVT prophylaxis: heparin  injection 5,000 Units Start: 10/27/23 0600  Level of care: Progressive   Code Status: Full Code  Subjective: Patient is seen and examined today morning. He is sitting in chair, able to answer me appropriately. Eating fair. Off supplemental O2. No family at bedside.  Physical Exam: Vitals:   10/28/23 0510 10/28/23 0820 10/28/23 1151 10/28/23 1223  BP: 126/80 (!) 148/91  (!) 155/80  Pulse: 97 92  97  Resp: 19 19 16 16   Temp: 97.6 F (36.4 C) 97.8 F (36.6 C)  97.7 F (36.5 C)  TempSrc: Oral Oral  Oral  SpO2: 95% 95% 96% 95%  Weight:      Height:        General - Elderly Caucasian male, no apparent distress HEENT - PERRLA, EOMI, atraumatic head, non tender sinuses. Lung - Clear, basal rales, no rhonchi, wheezes. Heart - S1, S2 heard, no murmurs, rubs, trace pedal edema. Abdomen - Soft, non tender, bowel sounds good Neuro - Alert, awake and oriented x 3, non focal exam. Skin - Warm and dry.  Data Reviewed:      Latest Ref Rng & Units 10/27/2023    5:07 AM 10/26/2023    9:29 PM 10/26/2023    9:24 PM  CBC  WBC 4.0 - 10.5 K/uL 10.6   11.1   Hemoglobin 13.0 - 17.0 g/dL  16.0  17.0  16.8   Hematocrit 39.0 - 52.0 % 46.6  50.0  48.5   Platelets 150 - 400 K/uL 166   165       Latest Ref Rng & Units 10/27/2023    5:07 AM 10/26/2023    9:29 PM 10/26/2023    9:24 PM  BMP  Glucose 70 - 99 mg/dL 784  833  838   BUN 8 - 23 mg/dL 25  26  25    Creatinine 0.61 - 1.24 mg/dL 8.17  8.29  8.22   Sodium 135 - 145 mmol/L 137  138  139   Potassium 3.5 - 5.1 mmol/L 3.7  3.7  3.5   Chloride 98 - 111 mmol/L 104  102  103   CO2 22 - 32 mmol/L 19   22   Calcium  8.9 - 10.3 mg/dL 8.5   8.5    NM Pulmonary Perfusion Result Date: 10/27/2023 CLINICAL DATA:  Possible pulmonary embolism, indeterminate risk. Positive D-dimer. Shortness of breath. EXAM: NUCLEAR MEDICINE PERFUSION LUNG SCAN  TECHNIQUE: Perfusion images were obtained in multiple projections after intravenous injection of radiopharmaceutical. Ventilation scans intentionally deferred if perfusion scan and chest x-ray adequate for interpretation during COVID 19 epidemic. RADIOPHARMACEUTICALS:  4.26 mCi Tc-25m MAA IV COMPARISON:  Chest x-ray 10/26/2023. FINDINGS: No perfusion defect is identified. IMPRESSION: Normal or very low probability of pulmonary embolism. Electronically Signed   By: Leita Birmingham M.D.   On: 10/27/2023 14:58   CT CHEST WO CONTRAST Result Date: 10/26/2023 CLINICAL DATA:  Dyspnea EXAM: CT CHEST WITHOUT CONTRAST TECHNIQUE: Multidetector CT imaging of the chest was performed following the standard protocol without IV contrast. RADIATION DOSE REDUCTION: This exam was performed according to the departmental dose-optimization program which includes automated exposure control, adjustment of the mA and/or kV according to patient size and/or use of iterative reconstruction technique. COMPARISON:  None Available. FINDINGS: Cardiovascular: Status post coronary artery bypass grafting. Global cardiac size within normal limits. No pericardial effusion. Central pulmonary arteries are of normal caliber. Mild atherosclerotic calcification within the thoracic aorta. No aortic aneurysm. Mediastinum/Nodes: No enlarged mediastinal or axillary lymph nodes. Thyroid  gland, trachea, and esophagus demonstrate no significant findings. Lungs/Pleura: Trace right pleural effusion. Mild bibasilar dependent atelectasis. Lungs are otherwise clear. No pneumothorax. No central obstructing lesion. Respiratory motion artifact noted. Upper Abdomen: Moderate hepatic steatosis. Simple exophytic cortical cyst arises from the upper pole the right kidney measuring at least 9 cm in greatest dimension. No acute abnormality. 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 Osseous structures are otherwise age-appropriate. No acute bone abnormality.  IMPRESSION: 1. No acute intrathoracic pathology identified. 2. Moderate hepatic steatosis. Aortic Atherosclerosis (ICD10-I70.0). Electronically Signed   By: Dorethia Molt M.D.   On: 10/26/2023 22:11   DG Chest Port 1 View Result Date: 10/26/2023 CLINICAL DATA:  Cough with shortness of breath EXAM: PORTABLE CHEST 1 VIEW COMPARISON:  Chest x-ray 09/16/2021 FINDINGS: Cardiomediastinal silhouette is stable, heart is mildly enlarged. Sternotomy wires are present. The lungs are clear. There is no pleural effusion or pneumothorax. No acute fractures are seen. IMPRESSION: No active disease. Mild cardiomegaly. Electronically Signed   By: Greig Pique M.D.   On: 10/26/2023 21:17    Family Communication: Discussed with patient, understand and agree. All questions answered.  Disposition: Status is: Inpatient Remains inpatient appropriate because: COVID treatment  Planned Discharge Destination: Home with Home Health     Time spent: 44 minutes  Author: Concepcion Riser, MD 10/28/2023 12:45 PM Secure chat  7am to 7pm For on call review [email or web address].

## 2023-10-28 NOTE — Progress Notes (Signed)
 Physical Therapy Treatment Patient Details Name: Clifford [name] MRN: [id number] DOB: 1946-01-11 Today's Date: 10/28/2023   History of Present Illness The pt is an 78 yo male presenting 8/30 with SpO2 89% on RA and SOB, wife also reports pt sliding off couch x2 in last day and unable to get back up. Work up revealed COVID +, Ddimer +, and mild troponin elevation. PMH in cludes: CAD s/p CABG x3, DM II, HTN, HLD, prostate cancer.    PT Comments  Pt making steady progress with mobility and is very motivated to return to more independence. Pt reports falls over at least the past month so likely underlying balance issues on top of the acute Covid illness. Feel pt would benefit more from OPPT than HHPT and wife is in agreement. [facility] is close to their home.     If plan is discharge home, recommend the following: A little help with walking and/or transfers;A little help with bathing/dressing/bathroom;Assist for transportation;Help with stairs or ramp for entrance   Can travel by private vehicle        Equipment Recommendations  Rolling walker (2 wheels);BSC/3in1    Recommendations for Other Services       Precautions / Restrictions Precautions Precautions: Fall Recall of Precautions/Restrictions: Intact Restrictions Weight Bearing Restrictions Per Provider Order: No     Mobility  Bed Mobility               General bed mobility comments: OOB in chair    Transfers Overall transfer level: Needs assistance Equipment used: Rolling walker (2 wheels) Transfers: Sit to/from Stand Sit to Stand: Min assist, Contact guard assist           General transfer comment: Initially min assist for stability and progressed to CGA as pt practiced.    Ambulation/Gait Ambulation/Gait assistance: Contact guard assist Gait Distance (Feet): 120 Feet (120' x 1, 40' x 2) Assistive device: Rolling walker (2 wheels) Gait Pattern/deviations: Step-through pattern,  Decreased stride length, Knees buckling, Trunk flexed Gait velocity: decr Gait velocity interpretation: <1.8 ft/sec, indicate of risk for recurrent falls   General Gait Details: Some tremulousness intermittently but improved with incr time on his feet. Verbal cues to stay closer to walker   Stairs             Wheelchair Mobility     Tilt Bed    Modified Rankin (Stroke Patients Only)       Balance Overall balance assessment: Needs assistance Sitting-balance support: No upper extremity supported, Feet supported Sitting balance-Leahy Scale: Fair     Standing balance support: Bilateral upper extremity supported, Single extremity supported, During functional activity Standing balance-Leahy Scale: Poor Standing balance comment: Walker and supervision for static standing. Able to stand without UE assist with CGA for safety.                            Communication Communication Communication: No apparent difficulties  Cognition Arousal: Alert Behavior During Therapy: WFL for tasks assessed/performed   PT - Cognitive impairments: No apparent impairments                         Following commands: Intact      Cueing Cueing Techniques: Verbal cues  Exercises Other Exercises Other Exercises: Repeat sit to stand x 5 Other Exercises: Stand and reach in multiple directions with single UE support    General Comments  General comments (skin integrity, edema, etc.): VSS on RA. SpO2 95-98% on RA      Pertinent Vitals/Pain      Home Living                          Prior Function            PT Goals (current goals can now be found in the care plan section) Acute Rehab PT Goals Patient Stated Goal: to return to independence Progress towards PT goals: Progressing toward goals    Frequency    Min 2X/week      PT Plan      Co-evaluation              AM-PAC PT 6 Clicks Mobility   Outcome Measure  Help needed turning  from your back to your side while in a flat bed without using bedrails?: A Little Help needed moving from lying on your back to sitting on the side of a flat bed without using bedrails?: A Little Help needed moving to and from a bed to a chair (including a wheelchair)?: A Little Help needed standing up from a chair using your arms (e.g., wheelchair or bedside chair)?: A Little Help needed to walk in hospital room?: A Little Help needed climbing 3-5 steps with a railing? : A Lot 6 Click Score: 17    End of Session Equipment Utilized During Treatment: Gait belt Activity Tolerance: Patient tolerated treatment well Patient left: in chair;with call bell/phone within reach;with chair alarm set;with family/visitor present Nurse Communication: Mobility status PT Visit Diagnosis: Unsteadiness on feet (R26.81);Other abnormalities of gait and mobility (R26.89);Muscle weakness (generalized) (M62.81)     Time: 8356-8289 PT Time Calculation (min) (ACUTE ONLY): 27 min  Charges:    $Gait Training: 8-22 mins $Therapeutic Exercise: 8-22 mins PT General Charges $$ ACUTE PT VISIT: 1 Visit                     [facility] PT Acute Rehabilitation Services Office [phone]    [facility] ORN [facility] 10/28/2023, 5:34 PM

## 2023-10-28 NOTE — Plan of Care (Signed)
  Problem: Education: Goal: Knowledge of risk factors and measures for prevention of condition will improve Outcome: Progressing   Problem: Coping: Goal: Psychosocial and spiritual needs will be supported Outcome: Progressing   Problem: Respiratory: Goal: Will maintain a patent airway Outcome: Progressing Goal: Complications related to the disease process, condition or treatment will be avoided or minimized Outcome: Progressing   Problem: Education: Goal: Ability to describe self-care measures that may prevent or decrease complications (Diabetes Survival Skills Education) will improve Outcome: Progressing Goal: Individualized Educational Video(s) Outcome: Progressing   Problem: Coping: Goal: Ability to adjust to condition or change in health will improve Outcome: Progressing   Problem: Fluid Volume: Goal: Ability to maintain a balanced intake and output will improve Outcome: Progressing   Problem: Health Behavior/Discharge Planning: Goal: Ability to identify and utilize available resources and services will improve Outcome: Progressing Goal: Ability to manage health-related needs will improve Outcome: Progressing   Problem: Metabolic: Goal: Ability to maintain appropriate glucose levels will improve Outcome: Progressing   Problem: Skin Integrity: Goal: Risk for impaired skin integrity will decrease Outcome: Progressing   Problem: Nutritional: Goal: Maintenance of adequate nutrition will improve Outcome: Progressing Goal: Progress toward achieving an optimal weight will improve Outcome: Progressing   Problem: Tissue Perfusion: Goal: Adequacy of tissue perfusion will improve Outcome: Progressing   Problem: Education: Goal: Knowledge of General Education information will improve Description: Including pain rating scale, medication(s)/side effects and non-pharmacologic comfort measures Outcome: Progressing   Problem: Health Behavior/Discharge Planning: Goal:  Ability to manage health-related needs will improve Outcome: Progressing   Problem: Clinical Measurements: Goal: Ability to maintain clinical measurements within normal limits will improve Outcome: Progressing Goal: Will remain free from infection Outcome: Progressing Goal: Diagnostic test results will improve Outcome: Progressing Goal: Respiratory complications will improve Outcome: Progressing Goal: Cardiovascular complication will be avoided Outcome: Progressing   Problem: Activity: Goal: Risk for activity intolerance will decrease Outcome: Progressing   Problem: Nutrition: Goal: Adequate nutrition will be maintained Outcome: Progressing   Problem: Coping: Goal: Level of anxiety will decrease Outcome: Progressing   Problem: Elimination: Goal: Will not experience complications related to bowel motility Outcome: Progressing Goal: Will not experience complications related to urinary retention Outcome: Progressing

## 2023-10-29 ENCOUNTER — Inpatient Hospital Stay (HOSPITAL_COMMUNITY)

## 2023-10-29 ENCOUNTER — Other Ambulatory Visit (HOSPITAL_COMMUNITY): Payer: Self-pay

## 2023-10-29 DIAGNOSIS — E1122 Type 2 diabetes mellitus with diabetic chronic kidney disease: Secondary | ICD-10-CM | POA: Diagnosis not present

## 2023-10-29 DIAGNOSIS — R7989 Other specified abnormal findings of blood chemistry: Secondary | ICD-10-CM | POA: Diagnosis not present

## 2023-10-29 DIAGNOSIS — U071 COVID-19: Secondary | ICD-10-CM | POA: Diagnosis not present

## 2023-10-29 DIAGNOSIS — J9601 Acute respiratory failure with hypoxia: Secondary | ICD-10-CM | POA: Diagnosis not present

## 2023-10-29 LAB — HEPATIC FUNCTION PANEL
ALT: 66 U/L — ABNORMAL HIGH (ref 0–44)
AST: 78 U/L — ABNORMAL HIGH (ref 15–41)
Albumin: 3.1 g/dL — ABNORMAL LOW (ref 3.5–5.0)
Alkaline Phosphatase: 41 U/L (ref 38–126)
Bilirubin, Direct: 0.2 mg/dL (ref 0.0–0.2)
Indirect Bilirubin: 0.6 mg/dL (ref 0.3–0.9)
Total Bilirubin: 0.8 mg/dL (ref 0.0–1.2)
Total Protein: 6.3 g/dL — ABNORMAL LOW (ref 6.5–8.1)

## 2023-10-29 LAB — BASIC METABOLIC PANEL WITH GFR
Anion gap: 13 (ref 5–15)
BUN: 37 mg/dL — ABNORMAL HIGH (ref 8–23)
CO2: 24 mmol/L (ref 22–32)
Calcium: 9 mg/dL (ref 8.9–10.3)
Chloride: 102 mmol/L (ref 98–111)
Creatinine, Ser: 1.48 mg/dL — ABNORMAL HIGH (ref 0.61–1.24)
GFR, Estimated: 48 mL/min — ABNORMAL LOW (ref >60)
Glucose, Bld: 159 mg/dL — ABNORMAL HIGH (ref 70–99)
Potassium: 4 mmol/L (ref 3.5–5.1)
Sodium: 139 mmol/L (ref 135–145)

## 2023-10-29 LAB — CBC
HCT: 44.5 % (ref 39.0–52.0)
Hemoglobin: 15.5 g/dL (ref 13.0–17.0)
MCH: 30.5 pg (ref 26.0–34.0)
MCHC: 34.8 g/dL (ref 30.0–36.0)
MCV: 87.4 fL (ref 80.0–100.0)
Platelets: 217 K/uL (ref 150–400)
RBC: 5.09 MIL/uL (ref 4.22–5.81)
RDW: 13.1 % (ref 11.5–15.5)
WBC: 9.1 K/uL (ref 4.0–10.5)
nRBC: 0 % (ref 0.0–0.2)

## 2023-10-29 LAB — GLUCOSE, CAPILLARY
Glucose-Capillary: 157 mg/dL — ABNORMAL HIGH (ref 70–99)
Glucose-Capillary: 221 mg/dL — ABNORMAL HIGH (ref 70–99)

## 2023-10-29 MED ORDER — INFLUENZA VAC SPLIT HIGH-DOSE 0.5 ML IM SUSY: 0.5000 mL | PREFILLED_SYRINGE | INTRAMUSCULAR | Status: DC

## 2023-10-29 MED ORDER — LISINOPRIL 20 MG PO TABS
20.0000 mg | ORAL_TABLET | Freq: Every day | ORAL | Status: DC
  Administered 2023-10-29: 20 mg via ORAL
  Filled 2023-10-29: qty 1

## 2023-10-29 MED ORDER — ASPIRIN EC 81 MG PO TBEC
81.0000 mg | DELAYED_RELEASE_TABLET | Freq: Every day | ORAL | 11 refills | Status: AC
  Filled 2023-10-29: qty 30, 30d supply, fill #0

## 2023-10-29 MED ORDER — GUAIFENESIN 100 MG/5ML PO LIQD
5.0000 mL | ORAL | 0 refills | Status: AC | PRN
  Filled 2023-10-29: qty 118, 4d supply, fill #0

## 2023-10-29 MED ORDER — MELATONIN 3 MG PO TABS
6.0000 mg | ORAL_TABLET | Freq: Every evening | ORAL | 0 refills | Status: AC | PRN
  Filled 2023-10-29: qty 20, 10d supply, fill #0

## 2023-10-29 NOTE — Inpatient Diabetes Management (Signed)
 Inpatient Diabetes Program Recommendations  AACE/ADA: New Consensus Statement on Inpatient Glycemic Control (2015)  Target Ranges:  Prepandial:   less than 140 mg/dL      Peak postprandial:   less than 180 mg/dL (1-2 hours)      Critically ill patients:  140 - 180 mg/dL   Lab Results  Component Value Date   GLUCAP 157 (H) 10/29/2023   HGBA1C 8.0 (H) 08/19/2023    Review of Glycemic Control  Latest Reference Range & Units 10/27/23 13:57 10/27/23 16:53 10/27/23 21:13 10/28/23 08:16 10/28/23 12:21 10/28/23 16:23 10/28/23 20:41 10/29/23 07:48  Glucose-Capillary 70 - 99 mg/dL 777 (H) 680 (H) 630 (H) 268 (H) 386 (H) 370 (H) 213 (H) 157 (H)  (H): Data is abnormally high Diabetes history: Type 2 DM Outpatient Diabetes medications: Amaryl  2 mg QD Current orders for Inpatient glycemic control: Novolog  0-15 units TID & HS Decadron  2 mg QD  Inpatient Diabetes Program Recommendations:    In the setting of steroids, consider adding Novolog  3 units TID (assuming patient consuming >50% of meals)  Thanks, Tinnie Minus, MSN, RNC-OB Diabetes Coordinator [phone] (8a-5p)

## 2023-10-29 NOTE — Progress Notes (Signed)
 Mobility Specialist Progress Note;   10/29/23 1000  Mobility  Activity Pivoted/transferred from bed to chair  Level of Assistance Minimal assist, patient does 75% or more  Assistive Device Front wheel walker  Distance Ambulated (ft) 3 ft  Activity Response Tolerated well  Mobility Referral Yes  Mobility visit 1 Mobility  Mobility Specialist Start Time (ACUTE ONLY) 1000  Mobility Specialist Stop Time (ACUTE ONLY) 1021  Mobility Specialist Time Calculation (min) (ACUTE ONLY) 21 min   Pt agreeable to mobility. Required MinA for bed mobility w/ use of bed pads, MinG to safely transfer to chair. VSS throughout. Only c/o generalized weakness this AM, compared to yesterday. Pt left in chair with all needs met, alarm on.   Lauraine Erm Mobility Specialist Please contact via SecureChat or [employer] [phone]

## 2023-10-29 NOTE — Discharge Summary (Signed)
 Physician Discharge Summary   Patient: Clifford [name] MRN: [id number] DOB: [birth year]  Admit date:     10/26/2023  Discharge date: {dischdate:26783}  Discharge Physician: Concepcion Riser   PCP: Rollene Almarie LABOR, MD   Recommendations at discharge:  {Tip this will not be part of the note when signed- Example include specific recommendations for outpatient follow-up, pending tests to follow-up on. (Optional):26781}  ***  Discharge Diagnoses: Principal Problem:   COVID-19 Active Problems:   Pneumonia due to COVID-19 virus   Acute hypoxic respiratory failure (HCC)   Elevated troponin  Resolved Problems:   * No resolved hospital problems. [facility] Course: No notes on file  Assessment and Plan: No notes have been filed under this hospital service. Service: Hospitalist     {Tip this will not be part of the note when signed Body mass index is 31.05 kg/m. , ,  (Optional):26781}  {(NOTE) Pain control PDMP Statment (Optional):26782} Consultants: *** Procedures performed: ***  Disposition: {Plan; Disposition:26390} Diet recommendation:  Discharge Diet Orders (From admission, onward)     Start     Ordered   10/29/23 0000  Diet - low sodium heart healthy        10/29/23 1312   10/29/23 0000  Diet Carb Modified        10/29/23 1312           {Diet_Plan:26776} DISCHARGE MEDICATION: Allergies as of 10/29/2023       Reactions   Cinnamon Other (See Comments)   Stomach pain        Medication List     TAKE these medications    aspirin  EC 81 MG tablet Take 1 tablet (81 mg total) by mouth daily. What changed: Another medication with the same name was removed. Continue taking this medication, and follow the directions you see here.   glimepiride  2 MG tablet Commonly known as: AMARYL  Take 1 tablet (2 mg total) by mouth daily with breakfast.   guaiFENesin  100 MG/5ML liquid Commonly known as: ROBITUSSIN Take 5 mLs by mouth every 4 (four) hours as  needed for cough or to loosen phlegm.   lisinopril  20 MG tablet Commonly known as: ZESTRIL  Take 1 tablet (20 mg total) by mouth daily.   melatonin 3 MG Tabs tablet Take 2 tablets (6 mg total) by mouth at bedtime as needed for up to 20 days.   Mens 50+ Multi Vitamin/Min Tabs Take 1 tablet by mouth daily.   ONE TOUCH ULTRA TEST test strip Generic drug: glucose blood 1 each by Other route 2 (two) times daily. Use to check blood sugars twice a day Dx E11.9   OneTouch Delica Lancets 33G Misc Use to help check blood sugars twice a day Dx e11.9   rosuvastatin  40 MG tablet Commonly known as: CRESTOR  Take 1 tablet (40 mg total) by mouth daily.               Durable Medical Equipment  (From admission, onward)           Start     Ordered   10/29/23 1304  DME 3-in-1  Once        10/29/23 1312   10/29/23 1303  DME Walker  Once       Question Answer Comment  Walker: With 5 Inch Wheels   Patient needs a walker to treat with the following condition COVID-19   Patient needs a walker to treat with the following condition Age-related physical debility  10/29/23 1312            Discharge Exam: Filed Weights   10/27/23 0337 10/27/23 0555  Weight: 78.5 kg 77 kg   ***  Condition at discharge: {DC Condition:26389}  The results of significant diagnostics from this hospitalization (including imaging, microbiology, ancillary and laboratory) are listed below for reference.   Imaging Studies: NM Pulmonary Perfusion Result Date: 10/27/2023 CLINICAL DATA:  Possible pulmonary embolism, indeterminate risk. Positive D-dimer. Shortness of breath. EXAM: NUCLEAR MEDICINE PERFUSION LUNG SCAN TECHNIQUE: Perfusion images were obtained in multiple projections after intravenous injection of radiopharmaceutical. Ventilation scans intentionally deferred if perfusion scan and chest x-ray adequate for interpretation during COVID 19 epidemic. RADIOPHARMACEUTICALS:  4.26 mCi Tc-55m MAA IV  COMPARISON:  Chest x-ray 10/26/2023. FINDINGS: No perfusion defect is identified. IMPRESSION: Normal or very low probability of pulmonary embolism. Electronically Signed   By: Leita Birmingham M.D.   On: 10/27/2023 14:58   CT CHEST WO CONTRAST Result Date: 10/26/2023 CLINICAL DATA:  Dyspnea EXAM: CT CHEST WITHOUT CONTRAST TECHNIQUE: Multidetector CT imaging of the chest was performed following the standard protocol without IV contrast. RADIATION DOSE REDUCTION: This exam was performed according to the departmental dose-optimization program which includes automated exposure control, adjustment of the mA and/or kV according to patient size and/or use of iterative reconstruction technique. COMPARISON:  None Available. FINDINGS: Cardiovascular: Status post coronary artery bypass grafting. Global cardiac size within normal limits. No pericardial effusion. Central pulmonary arteries are of normal caliber. Mild atherosclerotic calcification within the thoracic aorta. No aortic aneurysm. Mediastinum/Nodes: No enlarged mediastinal or axillary lymph nodes. Thyroid  gland, trachea, and esophagus demonstrate no significant findings. Lungs/Pleura: Trace right pleural effusion. Mild bibasilar dependent atelectasis. Lungs are otherwise clear. No pneumothorax. No central obstructing lesion. Respiratory motion artifact noted. Upper Abdomen: Moderate hepatic steatosis. Simple exophytic cortical cyst arises from the upper pole the right kidney measuring at least 9 cm in greatest dimension. No acute abnormality. 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 Osseous structures are otherwise age-appropriate. No acute bone abnormality. IMPRESSION: 1. No acute intrathoracic pathology identified. 2. Moderate hepatic steatosis. Aortic Atherosclerosis (ICD10-I70.0). Electronically Signed   By: Dorethia Molt M.D.   On: 10/26/2023 22:11   DG Chest Port 1 View Result Date: 10/26/2023 CLINICAL DATA:  Cough with shortness of  breath EXAM: PORTABLE CHEST 1 VIEW COMPARISON:  Chest x-ray 09/16/2021 FINDINGS: Cardiomediastinal silhouette is stable, heart is mildly enlarged. Sternotomy wires are present. The lungs are clear. There is no pleural effusion or pneumothorax. No acute fractures are seen. IMPRESSION: No active disease. Mild cardiomegaly. Electronically Signed   By: Greig Pique M.D.   On: 10/26/2023 21:17    Microbiology: Results for orders placed or performed during the hospital encounter of 10/26/23  Resp panel by RT-PCR (RSV, Flu A&B, Covid) Anterior Nasal Swab     Status: Abnormal   Collection Time: 10/26/23  9:24 PM   Specimen: Anterior Nasal Swab  Result Value Ref Range Status   SARS Coronavirus 2 by RT PCR POSITIVE (A) NEGATIVE Final   Influenza A by PCR NEGATIVE NEGATIVE Final   Influenza B by PCR NEGATIVE NEGATIVE Final    Comment: (NOTE) The Xpert Xpress SARS-CoV-2/FLU/RSV plus assay is intended as an aid in the diagnosis of influenza from Nasopharyngeal swab specimens and should not be used as a sole basis for treatment. Nasal washings and aspirates are unacceptable for Xpert Xpress SARS-CoV-2/FLU/RSV testing.  Fact Sheet for Patients: BloggerCourse.com  Fact Sheet for Healthcare Providers: SeriousBroker.it  This test is not yet approved or cleared by the [place]  FDA and has been authorized for detection and/or diagnosis of SARS-CoV-2 by FDA under an Emergency Use Authorization (EUA). This EUA will remain in effect (meaning this test can be used) for the duration of the COVID-19 declaration under Section 564(b)(1) of the Act, 21 U.S.C. section 360bbb-3(b)(1), unless the authorization is terminated or revoked.     Resp Syncytial Virus by PCR NEGATIVE NEGATIVE Final    Comment: (NOTE) Fact Sheet for Patients: BloggerCourse.com  Fact Sheet for Healthcare  Providers: SeriousBroker.it  This test is not yet approved or cleared by the [place]  FDA and has been authorized for detection and/or diagnosis of SARS-CoV-2 by FDA under an Emergency Use Authorization (EUA). This EUA will remain in effect (meaning this test can be used) for the duration of the COVID-19 declaration under Section 564(b)(1) of the Act, 21 U.S.C. section 360bbb-3(b)(1), unless the authorization is terminated or revoked.  Performed at [facility], [address], [place], [place] [address]   Culture, blood (routine x 2)     Status: None (Preliminary result)   Collection Time: 10/26/23  9:24 PM   Specimen: BLOOD LEFT ARM  Result Value Ref Range Status   Specimen Description BLOOD LEFT ARM  Final   Special Requests   Final    BOTTLES DRAWN AEROBIC AND ANAEROBIC Blood Culture results may not be optimal due to an inadequate volume of blood received in culture bottles   Culture   Final    NO GROWTH 3 DAYS Performed at [facility], [address] [place], [place] [address]    Report Status PENDING  Incomplete  Culture, blood (routine x 2)     Status: None (Preliminary result)   Collection Time: 10/26/23  9:24 PM   Specimen: BLOOD LEFT FOREARM  Result Value Ref Range Status   Specimen Description BLOOD LEFT FOREARM  Final   Special Requests   Final    BOTTLES DRAWN AEROBIC AND ANAEROBIC Blood Culture results may not be optimal due to an inadequate volume of blood received in culture bottles   Culture   Final    NO GROWTH 3 DAYS Performed at [facility], [address], [place], [place] [address]    Report Status PENDING  Incomplete    Labs: CBC: Recent Labs  Lab 10/26/23 2124 10/26/23 2129 10/27/23 0507 10/29/23 0539  WBC 11.1*  --  10.6* 9.1  NEUTROABS 9.8*  --   --   --   HGB 16.8 17.0 16.0 15.5  HCT 48.5 50.0 46.6 44.5  MCV 89.6  --  89.8 87.4  PLT 165  --  166 217   Basic Metabolic Panel: Recent  Labs  Lab 10/26/23 2124 10/26/23 2129 10/27/23 0507 10/29/23 0539  NA 139 138 137 139  K 3.5 3.7 3.7 4.0  CL 103 102 104 102  CO2 22  --  19* 24  GLUCOSE 161* 166* 215* 159*  BUN 25* 26* 25* 37*  CREATININE 1.77* 1.70* 1.82* 1.48*  CALCIUM  8.5*  --  8.5* 9.0  MG  --   --  1.9  --   PHOS  --   --  3.9  --    Liver Function Tests: Recent Labs  Lab 10/26/23 2124 10/27/23 0507  AST 83* 86*  ALT 38 41  ALKPHOS 37* 35*  BILITOT 0.7 1.1  PROT 6.4* 6.6  ALBUMIN  3.5 3.4*   CBG: Recent  Labs  Lab 10/28/23 1221 10/28/23 1623 10/28/23 2041 10/29/23 0748 10/29/23 1221  GLUCAP 386* 370* 213* 157* 221*    Discharge time spent: {LESS THAN/GREATER THAN:26388} 30 minutes.  Signed: Concepcion Riser, MD [facility] 10/29/2023

## 2023-10-29 NOTE — TOC CM/SW Note (Signed)
 Transition of Care [facility]) - Inpatient Brief Assessment   Patient Details  Name: Clifford [name] MRN: [id number] Date of Birth: 02/15/1946  Transition of Care [facility]) CM/SW Contact:    Sudie Erminio Deems, RN Phone Number: 10/29/2023, 1:42 PM   Clinical Narrative: Patient presented for respiratory distress. PTA patient was from home with spouse. Ambulatory referral submitted to [facility] outpatient PT- the office will call for a visit time. Spouse states she can drive patient to his appointments. Case Manager discussed DME with the patient and he declined the bsc and wants the rolling walker. Patient did not have an agency preference; therefore, DME ordered via [employer]. No further needs identified at this time.    Transition of Care Asessment: Insurance and Status: Insurance coverage has been reviewed Patient has primary care physician: Yes Home environment has been reviewed: reviewed Prior level of function:: Wants to maintain independence. Prior/Current Home Services: No current home services Social Drivers of Health Review: SDOH reviewed no interventions necessary Readmission risk has been reviewed: Yes Transition of care needs: no transition of care needs at this time

## 2023-10-29 NOTE — Progress Notes (Signed)
 DISCHARGE NOTE HOME Clifford [name] to be discharged Home per MD order. Discussed prescriptions and follow up appointments with the patient. Prescriptions given to patient; medication list explained in detail. Patient verbalized understanding.  Skin clean, dry and intact without evidence of skin break down, no evidence of skin tears noted. IV catheter discontinued intact. Site without signs and symptoms of complications. Dressing and pressure applied. Pt denies pain at the site currently. No complaints noted.  Patient free of lines, drains, and wounds.   An After Visit Summary (AVS) was printed and given to the patient. Patient escorted via wheelchair, and discharged home via private auto.  Hailley Byers K Tayquan Gassman, RN

## 2023-10-29 NOTE — Progress Notes (Signed)
 Physical Therapy Treatment Patient Details Name: Clifford [name] MRN: [id number] DOB: [birth year] Today's Date: 10/29/2023   History of Present Illness The pt is an 78 yo male presenting 8/30 with SpO2 89% on RA and SOB, wife also reports pt sliding off couch x2 in last day and unable to get back up. Work up revealed COVID +, Ddimer +, and mild troponin elevation. PMH in cludes: CAD s/p CABG x3, DM II, HTN, HLD, prostate cancer.    PT Comments  Pt seen for continued activity training and education prior to anticipated return home with wife. Pt demos improvement in sit-stand transfers as he was able to complete with CGA-supervision throughout session, mainly needing cues for UE positioning only. The pt was also able to greatly progress ambulation distance with CGA and single standing rest break. Pt's wife reports this is further and more activity than pt typically gets at home, but is still concerned about pt's seated balance. The pt apparently had episode of LOB to the R earlier this morning when sitting EOB. Pt denies any sx (dizziness, lightheadedness, fatigue, etc) and was able to demonstrate leaning outside BOS during session without core weakness or needing assist. Continue to recommend follow up therapy after d/c to facilitate improved dynamic balance, endurance, and wean from use of DME. All questions answered at this time, recommendations remain appropriate.     If plan is discharge home, recommend the following: A little help with walking and/or transfers;A little help with bathing/dressing/bathroom;Assist for transportation;Help with stairs or ramp for entrance   Can travel by private vehicle        Equipment Recommendations  Rolling walker (2 wheels)    Recommendations for Other Services       Precautions / Restrictions Precautions Precautions: Fall Recall of Precautions/Restrictions: Intact Restrictions Weight Bearing Restrictions Per Provider Order: No     Mobility  Bed  Mobility Overal bed mobility: Modified Independent             General bed mobility comments: pt using bed rails, slightly increased time    Transfers Overall transfer level: Needs assistance Equipment used: Rolling walker (2 wheels) Transfers: Sit to/from Stand Sit to Stand: Contact guard assist, Supervision           General transfer comment: no assist to rise, cues for hand placement    Ambulation/Gait Ambulation/Gait assistance: Contact guard assist, Supervision Gait Distance (Feet): 125 Feet Assistive device: Rolling walker (2 wheels) Gait Pattern/deviations: Step-through pattern, Decreased stride length, Trunk flexed Gait velocity: decr Gait velocity interpretation: <1.31 ft/sec, indicative of household ambulator   General Gait Details: pt without buckling this session, took x1 standing rest break but did not need physical assistance and SpO2 reminaed >90% on RA. progressed to close supervision with ambulation in room   Stairs             Wheelchair Mobility     Tilt Bed    Modified Rankin (Stroke Patients Only)       Balance Overall balance assessment: Needs assistance Sitting-balance support: No upper extremity supported, Feet supported Sitting balance-Leahy Scale: Fair     Standing balance support: Bilateral upper extremity supported, Single extremity supported, During functional activity Standing balance-Leahy Scale: Poor Standing balance comment: Walker and supervision for static standing. Able to stand without UE assist with CGA for safety.                            Communication Communication  Communication: No apparent difficulties  Cognition Arousal: Alert Behavior During Therapy: WFL for tasks assessed/performed   PT - Cognitive impairments: No apparent impairments                       PT - Cognition Comments: pt following commands well in session, not formally assessed Following commands: Intact       Cueing Cueing Techniques: Verbal cues  Exercises      General Comments General comments (skin integrity, edema, etc.): discussed use of gait belt for pt's wife, use of RW in home and frequent seated rest when mobilzing in the home.      Pertinent Vitals/Pain Pain Assessment Pain Assessment: No/denies pain     PT Goals (current goals can now be found in the care plan section) Acute Rehab PT Goals Patient Stated Goal: to return to independence PT Goal Formulation: With patient Time For Goal Achievement: 11/10/23 Potential to Achieve Goals: Good Progress towards PT goals: Progressing toward goals    Frequency    Min 2X/week       AM-PAC PT 6 Clicks Mobility   Outcome Measure  Help needed turning from your back to your side while in a flat bed without using bedrails?: A Little Help needed moving from lying on your back to sitting on the side of a flat bed without using bedrails?: A Little Help needed moving to and from a bed to a chair (including a wheelchair)?: A Little Help needed standing up from a chair using your arms (e.g., wheelchair or bedside chair)?: A Little Help needed to walk in hospital room?: A Little Help needed climbing 3-5 steps with a railing? : A Lot 6 Click Score: 17    End of Session Equipment Utilized During Treatment: Gait belt Activity Tolerance: Patient tolerated treatment well Patient left: in chair;with call bell/phone within reach;with chair alarm set;with family/visitor present Nurse Communication: Mobility status PT Visit Diagnosis: Unsteadiness on feet (R26.81);Other abnormalities of gait and mobility (R26.89);Muscle weakness (generalized) (M62.81)     Time: 8684-8646 PT Time Calculation (min) (ACUTE ONLY): 38 min  Charges:    $Gait Training: 8-22 mins $Therapeutic Exercise: 8-22 mins $Self Care/Home Management: 8-22 PT General Charges $$ ACUTE PT VISIT: 1 Visit                     Izetta Call, PT, DPT   Acute  Rehabilitation Department Office [phone] Secure Chat Communication Preferred   Izetta JULIANNA Call 10/29/2023, 2:31 PM

## 2023-10-29 NOTE — Plan of Care (Signed)
   Problem: Education: Goal: Knowledge of risk factors and measures for prevention of condition will improve Outcome: Progressing

## 2023-10-30 ENCOUNTER — Telehealth: Payer: Self-pay | Admitting: *Deleted

## 2023-10-30 NOTE — Transitions of Care (Post Inpatient/ED Visit) (Signed)
 10/30/2023  Name: Clifford [name] MRN: [id number] DOB: 12/29/1945  Today's TOC FU Call Status: Today's TOC FU Call Status:: Successful TOC FU Call Completed TOC FU Call Complete Date: 10/30/23 Patient's Name and Date of Birth confirmed.  Transition Care Management Follow-up Telephone Call Date of Discharge: 10/29/23 Discharge Facility: [facility] Type of Discharge: Inpatient Admission Primary Inpatient Discharge Diagnosis:: COVID 19- pneumonia: acute respiratory failure with hypoxia How have you been since you were released from the hospital?: Better (I am so much better; got a good nights sleep last night. No issues around breathing and no coughing.  I don't need to see Dr. Rollene after the hospital visit, and I don't need any more calls to check in.  I am doing fine and am back to my normal self) Any questions or concerns?: No  Items Reviewed: Did you receive and understand the discharge instructions provided?: Yes (thoroughly reviewed with patient who verbalizes good understanding of same) Medications obtained,verified, and reconciled?: Yes (Medications Reviewed) (Full medication reconciliation/ review completed; no concerns or discrepancies identified; confirmed patient obtained/ is taking all newly Rx'd medications as instructed; self-manages medications and denies questions/ concerns around medications today) Any new allergies since your discharge?: No Dietary orders reviewed?: Yes Type of Diet Ordered:: Diabetic diet as much as I can Do you have support at home?: Yes People in Home [RPT]: spouse Name of Support/Comfort Primary Source: Reports independent in self-care activities; resides with supportive spouse assists as/ if needed/ indicated  Medications Reviewed Today: Medications Reviewed Today     Reviewed by Dhruvan Gullion M, RN (Registered Nurse) on 10/30/23 at 1025  Med List Status: <None>   Medication Order Taking? Sig Documenting Provider Last Dose Status  Informant  aspirin  EC 81 MG tablet [id number] Yes Take 1 tablet (81 mg total) by mouth daily. Darci Pore, MD  Active   glimepiride  (AMARYL ) 2 MG tablet [id number] Yes Take 1 tablet (2 mg total) by mouth daily with breakfast. Rollene Almarie LABOR, MD  Active Self, Spouse/Significant Other, Pharmacy Records  guaiFENesin  [facility] 100 MG/5ML liquid [id number] Yes Take 5 mLs by mouth every 4 (four) hours as needed for cough or to loosen phlegm. Darci Pore, MD  Active   lisinopril  (ZESTRIL ) 20 MG tablet [id number] Yes Take 1 tablet (20 mg total) by mouth daily. Rollene Almarie LABOR, MD  Active Self, Spouse/Significant Other, Pharmacy Records  melatonin 3 MG TABS tablet [id number] Yes Take 2 tablets (6 mg total) by mouth at bedtime as needed for up to 20 days. Darci Pore, MD  Active   Multiple Vitamins-Minerals (MENS 50+ MULTI VITAMIN/MIN) TABS [id number] Yes Take 1 tablet by mouth daily. [provider]  Active Self, Spouse/Significant Other, Pharmacy Records  ONE TOUCH ULTRA TEST test strip [id number] Yes 1 each by Other route 2 (two) times daily. Use to check blood sugars twice a day Dx E11.9 Rollene Almarie LABOR, MD  Active Self, Spouse/Significant Other, Pharmacy Records  [facility] LANCETS [address]G [place] [id number] Yes Use to help check blood sugars twice a day Dx e11.9 Rollene Almarie LABOR, MD  Active Self, Spouse/Significant Other, Pharmacy Records  rosuvastatin  (CRESTOR ) 40 MG tablet [id number] Yes Take 1 tablet (40 mg total) by mouth daily. Rollene Almarie LABOR, MD  Active Self, Spouse/Significant Other, Pharmacy Records           Home Care and Equipment/Supplies: Were Home Health Services Ordered?: No Any new equipment or medical supplies ordered?: Yes (Rolling walker) Name of Medical supply agency?: [employer] Were  you able to get the equipment/medical supplies?: Yes Do you have any questions related to the use of the equipment/supplies?:  No  Functional Questionnaire: Do you need assistance with bathing/showering or dressing?: No Do you need assistance with meal preparation?: No Do you need assistance with eating?: No Do you have difficulty maintaining continence: No Do you need assistance with getting out of bed/getting out of a chair/moving?: No Do you have difficulty managing or taking your medications?: No  Follow up appointments reviewed: PCP Follow-up appointment confirmed?: No (Patient declined offer to schedule hospital follow up office visit with PCP: they didn't tell me to see her unless I started having problems.  I will schedule myself, if I start having any concerns) MD Provider Line [phone] Given: No (verified well-established with current PCP) Specialist Hospital Follow-up appointment confirmed?: NA (verified not indicated per hospital discharging provider discharge notes - patient confirms he has not specialists at this time) Do you need transportation to your follow-up appointment?: No Do you understand care options if your condition(s) worsen?: Yes-patient verbalized understanding  SDOH Interventions Today    Flowsheet Row Most Recent Value  SDOH Interventions   Food Insecurity Interventions Intervention Not Indicated  Housing Interventions Intervention Not Indicated  Transportation Interventions Intervention Not Indicated  [drives self at baseline,  spouse assists as needed/ indicated]  Utilities Interventions Intervention Not Indicated   See TOC assessment tabs for additional assessment/ TOC intervention information  Patient declines need for ongoing/ further care management/ coordination outreach; declines enrollment in 30-day TOC program- declines taking my direct phone number should needs/ concerns arise post-TOC call   Pls call/ message for questions,  Tiarah Shisler Mckinney Arthurine Oleary, RN, BSN, Media planner  Transitions of Care  [facility] Population [facility]  [place] [phone]: direct office

## 2023-10-31 LAB — CULTURE, BLOOD (ROUTINE X 2)
Culture: NO GROWTH
Culture: NO GROWTH

## 2023-11-06 ENCOUNTER — Ambulatory Visit: Attending: Internal Medicine

## 2023-11-06 ENCOUNTER — Other Ambulatory Visit: Payer: Self-pay

## 2023-11-06 DIAGNOSIS — M6281 Muscle weakness (generalized): Secondary | ICD-10-CM | POA: Insufficient documentation

## 2023-11-06 DIAGNOSIS — R262 Difficulty in walking, not elsewhere classified: Secondary | ICD-10-CM | POA: Insufficient documentation

## 2023-11-06 DIAGNOSIS — R0600 Dyspnea, unspecified: Secondary | ICD-10-CM | POA: Insufficient documentation

## 2023-11-06 NOTE — Therapy (Signed)
 OUTPATIENT PHYSICAL THERAPY LOWER EXTREMITY EVALUATION   Patient Name: Clifford [name] MRN: [id number] DOB:November 08, 1945, 78 y.o., male Today's Date: 11/06/2023  END OF SESSION:  PT End of Session - 11/06/23 1213     Visit Number 1    Date for PT Re-Evaluation 01/01/24    Progress Note Due on Visit 10    PT Start Time 0845    PT Stop Time 0930    PT Time Calculation (min) 45 min    Activity Tolerance Patient tolerated treatment well    Behavior During Therapy [facility] for tasks assessed/performed          Past Medical History:  Diagnosis Date   CAD (coronary artery disease) 02/27/2003   a. Previous stents RCA and circ, Cath 2005 patient with 70% PDA  //  b. [place]  5/17 >> LHC oLCx 50, pLAD 50, mLAD 95, RPDA 80, normal LVEF >> s/p CABG   Cancer (HCC)    Carotid artery disease (HCC)    a. Carotid US  5/17 bilat ICA 1-39%   Cataract    replace 11/2012   Diabetes mellitus    diet controlled   History of detached retina repair 11/06/2006   [facility]   History of echocardiogram    a. EF 55% to 60%. Wall motion was normal, Gr 1 DD, Trivial MR/TR   History of hematuria    History of prostate cancer 03/29/2005   s/p prostatectomy   Hyperlipidemia    Hypertension    Past Surgical History:  Procedure Laterality Date   ANGIOPLASTY     1995,2000,2002   CARDIAC CATHETERIZATION  07/26/2000   Initially the stenosis in the proximal to mid right coronary artery was estimated ar 95%. Following stenting, this improved to 0%. There was residual mild disease in the sostium of the proximal right coronary atrtery. Successful stentng of the proximal to mid right coronary artery with improvement in stent diameter narrowing from 95% to 0% Proc Date  07/29/2000   CARDIAC CATHETERIZATION N/A 07/04/2015   Procedure: Left Heart Cath and Coronary Angiography;  Surgeon: Candyce GORMAN Reek, MD;  Location: [facility] INVASIVE CV LAB;  Service: Cardiovascular;  Laterality: N/A;   CATARACT EXTRACTION W/ INTRAOCULAR  LENS IMPLANT Bilateral OS Sept '14; OD Oct '14   Dr. camillo   COLONOSCOPY  04/12/2015   Dr.Gessner   CORONARY ARTERY BYPASS GRAFT N/A 07/08/2015   Procedure: CORONARY ARTERY BYPASS GRAFTING (CABG)x three using left internal mammary artery to left anterior decending artery, left greater saphenous vein grafts to diagonal and posterolateral. Left leg greater saphenous leg vein via endoscope. ;  Surgeon: [place] KATHEE Jude, MD;  Location: [facility] OR;  Service: Open Heart Surgery;  Laterality: N/A;   CYSTOSCOPY N/A 07/08/2015   Procedure: CYSTOSCOPY FLEXIBLE WITH BALLOON DILATION OF BLADDER NECK CONTRACTURE WITH DIFFICULT CATHETER PLACEMENT;  Surgeon: Gretel Ferrara, MD;  Location: [facility] OR;  Service: Urology;  Laterality: N/A;   EYE SURGERY  11/24/2012   left eye cataract   EYE SURGERY  12/22/2012   right eye cataract   HEMORRHOID SURGERY     I&D of thrombosed hemorrhoid   LIPOMA EXCISION     POLYPECTOMY     PROSTATECTOMY  03/29/2005   PTCA  02/27/2003   TEE WITHOUT CARDIOVERSION N/A 07/08/2015   Procedure: TRANSESOPHAGEAL ECHOCARDIOGRAM (TEE);  Surgeon: [place] KATHEE Jude, MD;  Location: [facility] OR;  Service: Open Heart Surgery;  Laterality: N/A;   WRIST GANGLION EXCISION     Patient Active Problem List   Diagnosis  Date Noted   COVID-19 10/27/2023   Pneumonia due to COVID-19 virus 10/27/2023   Acute hypoxic respiratory failure (HCC) 10/27/2023   Elevated troponin 10/27/2023   Lumbar pain 06/03/2023   Weakness of both legs 06/03/2023   Constipation 12/30/2020   CKD (chronic kidney disease) stage 3, GFR 30-59 ml/min (HCC) 08/01/2015   Carotid artery disease (HCC)    Hyperlipidemia associated with type 2 diabetes mellitus (HCC) 07/03/2015   Hypertensive heart disease 07/03/2015   Routine health maintenance 12/11/2010   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  Then after hospitalization weakness and balance deficits  PERTINENT HISTORY: The pt is an 78 yo male presenting 8/30 with SpO2 89% on RA and SOB, wife also reports pt sliding off couch x2 in last day and unable to get back up. Work up revealed COVID +, Ddimer +, and mild troponin elevation. He was admitted from 10/26/23 to 10/29/23.  Referred to outpt PT to assist with his recovery of function PAIN:  Are you having pain? No  PRECAUTIONS: Fall  RED FLAGS: None   WEIGHT BEARING RESTRICTIONS: No  FALLS:  Has patient fallen in last 6 months? No  LIVING ENVIRONMENT: Lives with: lives with their spouse Lives in: House/apartment Stairs: has interior stairs but doesn't use Has following equipment at home: None  OCCUPATION: retired  PLOF: Independent  PATIENT GOALS: The patient wants to have better stability  NEXT MD VISIT: December 2025  OBJECTIVE:  Note: Objective measures were completed at Evaluation unless otherwise noted.  DIAGNOSTIC FINDINGS: na   COGNITION: Overall cognitive status: Within functional limits for tasks assessed     SENSATION: WFL  EDEMA:  None noted  POSTURE: wide base of support noted in standing   LOWER EXTREMITY MNF:hmnddob wnl   LOWER EXTREMITY MMT:all MMT 4/5 except B plantarflexion 3+/5 R shoulder IR and ER 4/5 with shaking, tremulous movement pattern   FUNCTIONAL TESTS:  5 times sit to stand: 16.06 sec Berg 42/56 Gait speed, 3 M in 6.5 sec:  0.46 m/sec Gait with horizontal head turns, up /down turns, eyes closed, all wnl.  Gait  backwards very slow, unsteady  GAIT: Distance walked: in clnic up to 90 ' Assistive device utilized: None Level of assistance: Modified independence Comments: wide base of support, shortened stance time on L arms mid guard, very slow, decreased to no arm swing  TREATMENT DATE: 11/06/23    PATIENT EDUCATION:  Education details: POC, goals  Person educated: Patient Education method: Explanation, Demonstration, Tactile cues, Verbal cues, and Handouts Education comprehension: verbalized understanding, returned demonstration, verbal cues required, tactile cues required, and needs further education  HOME EXERCISE PROGRAM: Access Code: [email or web address] URL: [email or web address] Date: 11/06/2023 Prepared by: Breklyn Fabrizio  Exercises - Heel Toe Raises with Counter Support  - 2 x daily - 7 x weekly - 2 sets - 10 reps - Standing Hip Abduction with Counter Support  - 2 x daily - 7 x weekly - 2 sets - 10 reps  ASSESSMENT:  CLINICAL IMPRESSION: Patient is a 78 y.o. male who was evaluated today by physical therapy evaluation due to a recent decline in his overall strength, stability, mobility, and hospitalization due to COVID.  Per he and his wife he was declining in stamina and function for 1 -2 months prior to this hospitalization.  He is concerned about losing his balance/ fall risk.  He does demonstrate risk for falls with BERG score of 42/56 and with very slow, guarded gait speed.  Has less stability with isolated weight bearing on L LE vs R.  Vestibular screening wnl.  Should benefit from physical therapy with focused guided progression with endurance, functional strength and balance training.  OBJECTIVE IMPAIRMENTS: decreased activity tolerance, decreased balance, decreased endurance, decreased knowledge of condition, difficulty walking, decreased strength, and  impaired perceived functional ability.   ACTIVITY LIMITATIONS: carrying, bending, standing, squatting, stairs, transfers, and locomotion level  PARTICIPATION LIMITATIONS: meal prep, cleaning, laundry, shopping, and community activity  PERSONAL FACTORS: Age, Behavior pattern, Time since onset of injury/illness/exacerbation, and 1-2 comorbidities: DM are also affecting patient's functional outcome.   REHAB POTENTIAL: Good  CLINICAL DECISION MAKING: Evolving/moderate complexity  EVALUATION COMPLEXITY: Moderate   GOALS: Goals reviewed with patient? Yes  SHORT TERM GOALS: Target date: 2 weeks, 11/20/23 I HEP Baseline: Goal status: INITIAL   LONG TERM GOALS: Target date: 8 weeks, 01/01/24  Berg improve from 42/56  to 45/56 or greater Baseline:  Goal status: INITIAL  2.  Gait speed consistently greater than 0.8 m/sec Baseline:  Goal status: INITIAL  3.  5 x sit to stand 14.8 sec or less improved from 16.06 sec Baseline:  Goal status: INITIAL    PLAN:  PT FREQUENCY: 2x/week  PT DURATION: 8 weeks  PLANNED INTERVENTIONS: 97110-Therapeutic exercises, 97530- Therapeutic activity, 97112- Neuromuscular re-education, 97535- Self Care, 02859- Manual therapy, and 97116- Gait training  PLAN FOR NEXT SESSION: initiate cardiovascular equipment, bulk strengthening B LE's and Ue's , trunk   Connar Keating L Bebe Moncure, PT, DPT, OCS 11/06/2023, 12:15 PM

## 2023-11-08 ENCOUNTER — Ambulatory Visit

## 2023-11-08 DIAGNOSIS — R262 Difficulty in walking, not elsewhere classified: Secondary | ICD-10-CM

## 2023-11-08 DIAGNOSIS — M6281 Muscle weakness (generalized): Secondary | ICD-10-CM

## 2023-11-08 NOTE — Therapy (Signed)
 OUTPATIENT PHYSICAL THERAPY LOWER EXTREMITY TREATMENT   Patient Name: Clifford [name] MRN: [id number] DOB:02/09/1946, 78 y.o., male Today's Date: 11/08/2023  END OF SESSION:  PT End of Session - 11/08/23 1011     Visit Number 2    Date for PT Re-Evaluation 01/01/24    Progress Note Due on Visit 10    PT Start Time 1015    PT Stop Time 1100    PT Time Calculation (min) 45 min    Activity Tolerance Patient tolerated treatment well    Behavior During Therapy [facility] for tasks assessed/performed           Past Medical History:  Diagnosis Date   CAD (coronary artery disease) 02/27/2003   a. Previous stents RCA and circ, Cath 2005 patient with 70% PDA  //  b. [place]  5/17 >> LHC oLCx 50, pLAD 50, mLAD 95, RPDA 80, normal LVEF >> s/p CABG   Cancer (HCC)    Carotid artery disease (HCC)    a. Carotid US  5/17 bilat ICA 1-39%   Cataract    replace 11/2012   Diabetes mellitus    diet controlled   History of detached retina repair 11/06/2006   [facility]   History of echocardiogram    a. EF 55% to 60%. Wall motion was normal, Gr 1 DD, Trivial MR/TR   History of hematuria    History of prostate cancer 03/29/2005   s/p prostatectomy   Hyperlipidemia    Hypertension    Past Surgical History:  Procedure Laterality Date   ANGIOPLASTY     1995,2000,2002   CARDIAC CATHETERIZATION  07/26/2000   Initially the stenosis in the proximal to mid right coronary artery was estimated ar 95%. Following stenting, this improved to 0%. There was residual mild disease in the sostium of the proximal right coronary atrtery. Successful stentng of the proximal to mid right coronary artery with improvement in stent diameter narrowing from 95% to 0% Proc Date  07/29/2000   CARDIAC CATHETERIZATION N/A 07/04/2015   Procedure: Left Heart Cath and Coronary Angiography;  Surgeon: Candyce GORMAN Reek, MD;  Location: [facility] INVASIVE CV LAB;  Service: Cardiovascular;  Laterality: N/A;   CATARACT EXTRACTION W/ INTRAOCULAR  LENS IMPLANT Bilateral OS Sept '14; OD Oct '14   Dr. camillo   COLONOSCOPY  04/12/2015   Dr.Gessner   CORONARY ARTERY BYPASS GRAFT N/A 07/08/2015   Procedure: CORONARY ARTERY BYPASS GRAFTING (CABG)x three using left internal mammary artery to left anterior decending artery, left greater saphenous vein grafts to diagonal and posterolateral. Left leg greater saphenous leg vein via endoscope. ;  Surgeon: Dallas KATHEE Jude, MD;  Location: [facility] OR;  Service: Open Heart Surgery;  Laterality: N/A;   CYSTOSCOPY N/A 07/08/2015   Procedure: CYSTOSCOPY FLEXIBLE WITH BALLOON DILATION OF BLADDER NECK CONTRACTURE WITH DIFFICULT CATHETER PLACEMENT;  Surgeon: Gretel Ferrara, MD;  Location: [facility] OR;  Service: Urology;  Laterality: N/A;   EYE SURGERY  11/24/2012   left eye cataract   EYE SURGERY  12/22/2012   right eye cataract   HEMORRHOID SURGERY     I&D of thrombosed hemorrhoid   LIPOMA EXCISION     POLYPECTOMY     PROSTATECTOMY  03/29/2005   PTCA  02/27/2003   TEE WITHOUT CARDIOVERSION N/A 07/08/2015   Procedure: TRANSESOPHAGEAL ECHOCARDIOGRAM (TEE);  Surgeon: Dallas KATHEE Jude, MD;  Location: [facility] OR;  Service: Open Heart Surgery;  Laterality: N/A;   WRIST GANGLION EXCISION     Patient Active Problem List  Diagnosis Date Noted   COVID-19 10/27/2023   Pneumonia due to COVID-19 virus 10/27/2023   Acute hypoxic respiratory failure (HCC) 10/27/2023   Elevated troponin 10/27/2023   Lumbar pain 06/03/2023   Weakness of both legs 06/03/2023   Constipation 12/30/2020   CKD (chronic kidney disease) stage 3, GFR 30-59 ml/min (HCC) 08/01/2015   Carotid artery disease (HCC)    Hyperlipidemia associated with type 2 diabetes mellitus (HCC) 07/03/2015   Hypertensive heart disease 07/03/2015   Routine health maintenance 12/11/2010   Diabetes mellitus type 2 with complications (HCC) 07/23/2007   Essential hypertension 03/07/2007   Coronary atherosclerosis 03/07/2007   PROSTATE CANCER, HX OF 03/07/2007    PCP:  Rollene Norris, MD  REFERRING PROVIDER: Darci Pore, MD  REFERRING DIAG: dyspnea upon exertion  THERAPY DIAG:  Difficulty in walking, not elsewhere classified  Muscle weakness (generalized)  Rationale for Evaluation and Treatment: Rehabilitation  ONSET DATE: 10/26/23  SUBJECTIVE:   SUBJECTIVE STATEMENT: I am doing a lot better. Every day I am improving in terms of strength. I am here to learn how to balance and take care of myself.   PERTINENT HISTORY: The pt is an 78 yo male presenting 8/30 with SpO2 89% on RA and SOB, wife also reports pt sliding off couch x2 in last day and unable to get back up. Work up revealed COVID +, Ddimer +, and mild troponin elevation. He was admitted from 10/26/23 to 10/29/23.  Referred to outpt PT to assist with his recovery of function PAIN:  Are you having pain? No  PRECAUTIONS: Fall  RED FLAGS: None   WEIGHT BEARING RESTRICTIONS: No  FALLS:  Has patient fallen in last 6 months? No  LIVING ENVIRONMENT: Lives with: lives with their spouse Lives in: House/apartment Stairs: has interior stairs but doesn't use Has following equipment at home: None  OCCUPATION: retired  PLOF: Independent  PATIENT GOALS: The patient wants to have better stability  NEXT MD VISIT: December 2025  OBJECTIVE:  Note: Objective measures were completed at Evaluation unless otherwise noted.  DIAGNOSTIC FINDINGS: na   COGNITION: Overall cognitive status: Within functional limits for tasks assessed     SENSATION: WFL  EDEMA:  None noted  POSTURE: wide base of support noted in standing   LOWER EXTREMITY MNF:hmnddob wnl   LOWER EXTREMITY MMT:all MMT 4/5 except B plantarflexion 3+/5 R shoulder IR and ER 4/5 with shaking, tremulous movement pattern   FUNCTIONAL TESTS:  5 times sit to stand: 16.06 sec Berg 42/56 Gait speed, 3 M in 6.5 sec:  0.46 m/sec Gait with horizontal head turns, up /down turns, eyes closed, all wnl.  Gait  backwards very slow, unsteady  GAIT: Distance walked: in clnic up to 90 ' Assistive device utilized: None Level of assistance: Modified independence Comments: wide base of support, shortened stance time on L arms mid guard, very slow, decreased to no arm swing  TREATMENT DATE:  11/08/23 LAQ 3# 2x10 HS curls green 2x10 Ball squeeze 2x10 NuStep L5x77mins  In bars hip flexion and abd  On airex balance and then eyes closed  Taps 4  STS 2x10   PATIENT EDUCATION:  Education details: POC, goals  Person educated: Patient Education method: Explanation, Demonstration, Tactile cues, Verbal cues, and Handouts Education comprehension: verbalized understanding, returned demonstration, verbal cues required, tactile cues required, and needs further education  HOME EXERCISE PROGRAM: Access Code: [email or web address] URL: [email or web address] Date: 11/08/2023 Prepared by: Almetta Fam  Exercises - Heel Toe Raises with Counter Support  - 2 x daily - 7 x weekly - 2 sets - 10 reps - Standing Hip Abduction with Counter Support  - 2 x daily - 7 x weekly - 2 sets - 10 reps - Standing March with Counter Support  - 2 x daily - 7 x weekly - 2 sets - 10 reps - Sit to Stand  - 2 x daily - 7 x weekly - 2 sets - 10 reps  ASSESSMENT:  CLINICAL IMPRESSION: Patient returns in good spirits, he is very grateful for PT being able to help him. He states the HEP was very helpful, he didn't realize how much just some small exercises would help. He does very well throughout session and was pleasant to work with.    Patient is a 78 y.o. male who was evaluated today by physical therapy evaluation due to a recent decline in his overall strength, stability, mobility, and hospitalization due to COVID.  Per he and his wife he was declining in stamina and function for 1 -2 months prior to this  hospitalization.  He is concerned about losing his balance/ fall risk.  He does demonstrate risk for falls with BERG score of 42/56 and with very slow, guarded gait speed.  Has less stability with isolated weight bearing on L LE vs R.  Vestibular screening wnl.  Should benefit from physical therapy with focused guided progression with endurance, functional strength and balance training.  OBJECTIVE IMPAIRMENTS: decreased activity tolerance, decreased balance, decreased endurance, decreased knowledge of condition, difficulty walking, decreased strength, and impaired perceived functional ability.   ACTIVITY LIMITATIONS: carrying, bending, standing, squatting, stairs, transfers, and locomotion level  PARTICIPATION LIMITATIONS: meal prep, cleaning, laundry, shopping, and community activity  PERSONAL FACTORS: Age, Behavior pattern, Time since onset of injury/illness/exacerbation, and 1-2 comorbidities: DM are also affecting patient's functional outcome.   REHAB POTENTIAL: Good  CLINICAL DECISION MAKING: Evolving/moderate complexity  EVALUATION COMPLEXITY: Moderate   GOALS: Goals reviewed with patient? Yes  SHORT TERM GOALS: Target date: 2 weeks, 11/20/23 I HEP Baseline: Goal status: INITIAL   LONG TERM GOALS: Target date: 8 weeks, 01/01/24  Berg improve from 42/56  to 45/56 or greater Baseline:  Goal status: INITIAL  2.  Gait speed consistently greater than 0.8 m/sec Baseline:  Goal status: INITIAL  3.  5 x sit to stand 14.8 sec or less improved from 16.06 sec Baseline:  Goal status: INITIAL    PLAN:  PT FREQUENCY: 2x/week  PT DURATION: 8 weeks  PLANNED INTERVENTIONS: 97110-Therapeutic exercises, 97530- Therapeutic activity, 97112- Neuromuscular re-education, 97535- Self Care, 02859- Manual therapy, and 97116- Gait training  PLAN FOR NEXT SESSION: initiate cardiovascular equipment, bulk strengthening B LE's and Ue's , trunk   Almetta Fam, PT, DPT, OCS 11/08/2023, 10:57  AM

## 2023-11-12 NOTE — Therapy (Signed)
 OUTPATIENT PHYSICAL THERAPY LOWER EXTREMITY TREATMENT   Patient Name: Clifford [name] MRN: [id number] DOB:03-22-1945, 78 y.o., male Today's Date: 11/13/2023  END OF SESSION:  PT End of Session - 11/13/23 0833     Visit Number 3    Date for PT Re-Evaluation 01/01/24    Progress Note Due on Visit 10    PT Start Time 0835    PT Stop Time 0920    PT Time Calculation (min) 45 min    Activity Tolerance Patient tolerated treatment well    Behavior During Therapy [facility] for tasks assessed/performed            Past Medical History:  Diagnosis Date   CAD (coronary artery disease) 02/27/2003   a. Previous stents RCA and circ, Cath 2005 patient with 70% PDA  //  b. [place]  5/17 >> LHC oLCx 50, pLAD 50, mLAD 95, RPDA 80, normal LVEF >> s/p CABG   Cancer (HCC)    Carotid artery disease (HCC)    a. Carotid US  5/17 bilat ICA 1-39%   Cataract    replace 11/2012   Diabetes mellitus    diet controlled   History of detached retina repair 11/06/2006   [facility]   History of echocardiogram    a. EF 55% to 60%. Wall motion was normal, Gr 1 DD, Trivial MR/TR   History of hematuria    History of prostate cancer 03/29/2005   s/p prostatectomy   Hyperlipidemia    Hypertension    Past Surgical History:  Procedure Laterality Date   ANGIOPLASTY     1995,2000,2002   CARDIAC CATHETERIZATION  07/26/2000   Initially the stenosis in the proximal to mid right coronary artery was estimated ar 95%. Following stenting, this improved to 0%. There was residual mild disease in the sostium of the proximal right coronary atrtery. Successful stentng of the proximal to mid right coronary artery with improvement in stent diameter narrowing from 95% to 0% Proc Date  07/29/2000   CARDIAC CATHETERIZATION N/A 07/04/2015   Procedure: Left Heart Cath and Coronary Angiography;  Surgeon: Candyce GORMAN Reek, MD;  Location: [facility] INVASIVE CV LAB;  Service: Cardiovascular;  Laterality: N/A;   CATARACT EXTRACTION W/  INTRAOCULAR LENS IMPLANT Bilateral OS Sept '14; OD Oct '14   Dr. camillo   COLONOSCOPY  04/12/2015   Dr.Gessner   CORONARY ARTERY BYPASS GRAFT N/A 07/08/2015   Procedure: CORONARY ARTERY BYPASS GRAFTING (CABG)x three using left internal mammary artery to left anterior decending artery, left greater saphenous vein grafts to diagonal and posterolateral. Left leg greater saphenous leg vein via endoscope. ;  Surgeon: Dallas KATHEE Jude, MD;  Location: [facility] OR;  Service: Open Heart Surgery;  Laterality: N/A;   CYSTOSCOPY N/A 07/08/2015   Procedure: CYSTOSCOPY FLEXIBLE WITH BALLOON DILATION OF BLADDER NECK CONTRACTURE WITH DIFFICULT CATHETER PLACEMENT;  Surgeon: Gretel Ferrara, MD;  Location: [facility] OR;  Service: Urology;  Laterality: N/A;   EYE SURGERY  11/24/2012   left eye cataract   EYE SURGERY  12/22/2012   right eye cataract   HEMORRHOID SURGERY     I&D of thrombosed hemorrhoid   LIPOMA EXCISION     POLYPECTOMY     PROSTATECTOMY  03/29/2005   PTCA  02/27/2003   TEE WITHOUT CARDIOVERSION N/A 07/08/2015   Procedure: TRANSESOPHAGEAL ECHOCARDIOGRAM (TEE);  Surgeon: Dallas KATHEE Jude, MD;  Location: [facility] OR;  Service: Open Heart Surgery;  Laterality: N/A;   WRIST GANGLION EXCISION     Patient Active Problem List  Diagnosis Date Noted   COVID-19 10/27/2023   Pneumonia due to COVID-19 virus 10/27/2023   Acute hypoxic respiratory failure (HCC) 10/27/2023   Elevated troponin 10/27/2023   Lumbar pain 06/03/2023   Weakness of both legs 06/03/2023   Constipation 12/30/2020   CKD (chronic kidney disease) stage 3, GFR 30-59 ml/min (HCC) 08/01/2015   Carotid artery disease (HCC)    Hyperlipidemia associated with type 2 diabetes mellitus (HCC) 07/03/2015   Hypertensive heart disease 07/03/2015   Routine health maintenance 12/11/2010   Diabetes mellitus type 2 with complications (HCC) 07/23/2007   Essential hypertension 03/07/2007   Coronary atherosclerosis 03/07/2007   PROSTATE CANCER, HX OF 03/07/2007     PCP: Rollene Norris, MD  REFERRING PROVIDER: Darci Pore, MD  REFERRING DIAG: dyspnea upon exertion  THERAPY DIAG:  Difficulty in walking, not elsewhere classified  Muscle weakness (generalized)  Rationale for Evaluation and Treatment: Rehabilitation  ONSET DATE: 10/26/23  SUBJECTIVE:   SUBJECTIVE STATEMENT: I am doing good. Was a little sore after first session but doing good today.   PERTINENT HISTORY: The pt is an 78 yo male presenting 8/30 with SpO2 89% on RA and SOB, wife also reports pt sliding off couch x2 in last day and unable to get back up. Work up revealed COVID +, Ddimer +, and mild troponin elevation. He was admitted from 10/26/23 to 10/29/23.  Referred to outpt PT to assist with his recovery of function PAIN:  Are you having pain? No  PRECAUTIONS: Fall  RED FLAGS: None   WEIGHT BEARING RESTRICTIONS: No  FALLS:  Has patient fallen in last 6 months? No  LIVING ENVIRONMENT: Lives with: lives with their spouse Lives in: House/apartment Stairs: has interior stairs but doesn't use Has following equipment at home: None  OCCUPATION: retired  PLOF: Independent  PATIENT GOALS: The patient wants to have better stability  NEXT MD VISIT: December 2025  OBJECTIVE:  Note: Objective measures were completed at Evaluation unless otherwise noted.  DIAGNOSTIC FINDINGS: na   COGNITION: Overall cognitive status: Within functional limits for tasks assessed     SENSATION: WFL  EDEMA:  None noted  POSTURE: wide base of support noted in standing   LOWER EXTREMITY MNF:hmnddob wnl   LOWER EXTREMITY MMT:all MMT 4/5 except B plantarflexion 3+/5 R shoulder IR and ER 4/5 with shaking, tremulous movement pattern   FUNCTIONAL TESTS:  5 times sit to stand: 16.06 sec Berg 42/56 Gait speed, 3 M in 6.5 sec:  0.46 m/sec Gait with horizontal head turns, up /down turns, eyes closed, all wnl.  Gait backwards very slow, unsteady  GAIT: Distance  walked: in clnic up to 90 ' Assistive device utilized: None Level of assistance: Modified independence Comments: wide base of support, shortened stance time on L arms mid guard, very slow, decreased to no arm swing  TREATMENT DATE:  11/13/23 NuStep L5x58mins  Leg ext 5# 2x10 HS curls 20# 2x10 Calf raises 2x10 Calf stretch on bar 20s x2 STS with chest press 2x10 Walking on beam   11/08/23 LAQ 3# 2x10 HS curls green 2x10 Ball squeeze 2x10 NuStep L5x30mins  In bars hip flexion and abd  On airex balance and then eyes closed  Taps 4  STS 2x10   PATIENT EDUCATION:  Education details: POC, goals  Person educated: Patient Education method: Explanation, Demonstration, Tactile cues, Verbal cues, and Handouts Education comprehension: verbalized understanding, returned demonstration, verbal cues required, tactile cues required, and needs further education  HOME EXERCISE PROGRAM: Access Code: [email or web address] URL: [email or web address] Date: 11/08/2023 Prepared by: Almetta Fam  Exercises - Heel Toe Raises with Counter Support  - 2 x daily - 7 x weekly - 2 sets - 10 reps - Standing Hip Abduction with Counter Support  - 2 x daily - 7 x weekly - 2 sets - 10 reps - Standing March with Counter Support  - 2 x daily - 7 x weekly - 2 sets - 10 reps - Sit to Stand  - 2 x daily - 7 x weekly - 2 sets - 10 reps  ASSESSMENT:  CLINICAL IMPRESSION: Patient doing well, continues to show good progress with functional tasks. He states he is moving much better than he was 2 weeks ago. Progressed to work on some machines today for LE strengthening. Will continue to benefit from ongoing PT to meet his goals.    Patient is a 78 y.o. male who was evaluated today by physical therapy evaluation due to a recent decline in his overall strength, stability, mobility, and  hospitalization due to COVID.  Per he and his wife he was declining in stamina and function for 1 -2 months prior to this hospitalization.  He is concerned about losing his balance/ fall risk.  He does demonstrate risk for falls with BERG score of 42/56 and with very slow, guarded gait speed.  Has less stability with isolated weight bearing on L LE vs R.  Vestibular screening wnl.  Should benefit from physical therapy with focused guided progression with endurance, functional strength and balance training.  OBJECTIVE IMPAIRMENTS: decreased activity tolerance, decreased balance, decreased endurance, decreased knowledge of condition, difficulty walking, decreased strength, and impaired perceived functional ability.   ACTIVITY LIMITATIONS: carrying, bending, standing, squatting, stairs, transfers, and locomotion level  PARTICIPATION LIMITATIONS: meal prep, cleaning, laundry, shopping, and community activity  PERSONAL FACTORS: Age, Behavior pattern, Time since onset of injury/illness/exacerbation, and 1-2 comorbidities: DM are also affecting patient's functional outcome.   REHAB POTENTIAL: Good  CLINICAL DECISION MAKING: Evolving/moderate complexity  EVALUATION COMPLEXITY: Moderate   GOALS: Goals reviewed with patient? Yes  SHORT TERM GOALS: Target date: 2 weeks, 11/20/23 I HEP Baseline: Goal status: INITIAL   LONG TERM GOALS: Target date: 8 weeks, 01/01/24  Berg improve from 42/56  to 45/56 or greater Baseline:  Goal status: INITIAL  2.  Gait speed consistently greater than 0.8 m/sec Baseline:  Goal status: INITIAL  3.  5 x sit to stand 14.8 sec or less improved from 16.06 sec Baseline:  Goal status: INITIAL    PLAN:  PT FREQUENCY: 2x/week  PT DURATION: 8 weeks  PLANNED INTERVENTIONS: 97110-Therapeutic exercises, 97530- Therapeutic activity, 97112- Neuromuscular re-education, 97535- Self Care, 02859- Manual therapy, and 97116- Gait training  PLAN FOR NEXT SESSION:  initiate cardiovascular equipment, bulk strengthening B LE's and Ue's , trunk   Almetta Fam, PT,  DPT, OCS 11/13/2023, 9:17 AM

## 2023-11-13 ENCOUNTER — Ambulatory Visit

## 2023-11-13 DIAGNOSIS — R262 Difficulty in walking, not elsewhere classified: Secondary | ICD-10-CM | POA: Diagnosis not present

## 2023-11-13 DIAGNOSIS — M6281 Muscle weakness (generalized): Secondary | ICD-10-CM

## 2023-11-20 ENCOUNTER — Ambulatory Visit

## 2023-11-20 ENCOUNTER — Other Ambulatory Visit: Payer: Self-pay

## 2023-11-20 DIAGNOSIS — R262 Difficulty in walking, not elsewhere classified: Secondary | ICD-10-CM

## 2023-11-20 DIAGNOSIS — M6281 Muscle weakness (generalized): Secondary | ICD-10-CM

## 2023-11-20 NOTE — Therapy (Signed)
 OUTPATIENT PHYSICAL THERAPY LOWER EXTREMITY TREATMENT   Patient Name: Clifford [name] MRN: [id number] DOB:09-03-1945, 78 y.o., male Today's Date: 11/20/2023  END OF SESSION:  PT End of Session - 11/20/23 0847     Visit Number 4    Date for Recertification  01/01/24    Progress Note Due on Visit 10    PT Start Time 0845    PT Stop Time 0927    PT Time Calculation (min) 42 min    Activity Tolerance Patient tolerated treatment well    Behavior During Therapy [facility] for tasks assessed/performed             Past Medical History:  Diagnosis Date   CAD (coronary artery disease) 02/27/2003   a. Previous stents RCA and circ, Cath 2005 patient with 70% PDA  //  b. [place]  5/17 >> LHC oLCx 50, pLAD 50, mLAD 95, RPDA 80, normal LVEF >> s/p CABG   Cancer (HCC)    Carotid artery disease    a. Carotid US  5/17 bilat ICA 1-39%   Cataract    replace 11/2012   Diabetes mellitus    diet controlled   History of detached retina repair 11/06/2006   [facility]   History of echocardiogram    a. EF 55% to 60%. Wall motion was normal, Gr 1 DD, Trivial MR/TR   History of hematuria    History of prostate cancer 03/29/2005   s/p prostatectomy   Hyperlipidemia    Hypertension    Past Surgical History:  Procedure Laterality Date   ANGIOPLASTY     1995,2000,2002   CARDIAC CATHETERIZATION  07/26/2000   Initially the stenosis in the proximal to mid right coronary artery was estimated ar 95%. Following stenting, this improved to 0%. There was residual mild disease in the sostium of the proximal right coronary atrtery. Successful stentng of the proximal to mid right coronary artery with improvement in stent diameter narrowing from 95% to 0% Proc Date  07/29/2000   CARDIAC CATHETERIZATION N/A 07/04/2015   Procedure: Left Heart Cath and Coronary Angiography;  Surgeon: Candyce GORMAN Reek, MD;  Location: [facility] INVASIVE CV LAB;  Service: Cardiovascular;  Laterality: N/A;   CATARACT EXTRACTION W/ INTRAOCULAR  LENS IMPLANT Bilateral OS Sept '14; OD Oct '14   Dr. camillo   COLONOSCOPY  04/12/2015   Dr.Gessner   CORONARY ARTERY BYPASS GRAFT N/A 07/08/2015   Procedure: CORONARY ARTERY BYPASS GRAFTING (CABG)x three using left internal mammary artery to left anterior decending artery, left greater saphenous vein grafts to diagonal and posterolateral. Left leg greater saphenous leg vein via endoscope. ;  Surgeon: Dallas KATHEE Jude, MD;  Location: [facility] OR;  Service: Open Heart Surgery;  Laterality: N/A;   CYSTOSCOPY N/A 07/08/2015   Procedure: CYSTOSCOPY FLEXIBLE WITH BALLOON DILATION OF BLADDER NECK CONTRACTURE WITH DIFFICULT CATHETER PLACEMENT;  Surgeon: Gretel Ferrara, MD;  Location: [facility] OR;  Service: Urology;  Laterality: N/A;   EYE SURGERY  11/24/2012   left eye cataract   EYE SURGERY  12/22/2012   right eye cataract   HEMORRHOID SURGERY     I&D of thrombosed hemorrhoid   LIPOMA EXCISION     POLYPECTOMY     PROSTATECTOMY  03/29/2005   PTCA  02/27/2003   TEE WITHOUT CARDIOVERSION N/A 07/08/2015   Procedure: TRANSESOPHAGEAL ECHOCARDIOGRAM (TEE);  Surgeon: Dallas KATHEE Jude, MD;  Location: [facility] OR;  Service: Open Heart Surgery;  Laterality: N/A;   WRIST GANGLION EXCISION     Patient Active Problem List  Diagnosis Date Noted   COVID-19 10/27/2023   Pneumonia due to COVID-19 virus 10/27/2023   Acute hypoxic respiratory failure (HCC) 10/27/2023   Elevated troponin 10/27/2023   Lumbar pain 06/03/2023   Weakness of both legs 06/03/2023   Constipation 12/30/2020   CKD (chronic kidney disease) stage 3, GFR 30-59 ml/min (HCC) 08/01/2015   Carotid artery disease    Hyperlipidemia associated with type 2 diabetes mellitus (HCC) 07/03/2015   Hypertensive heart disease 07/03/2015   Routine health maintenance 12/11/2010   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 Was a little sore after first session but doing good today.   PERTINENT HISTORY: The pt is an 78 yo male presenting 8/30 with SpO2 89% on RA and SOB, wife also reports pt sliding off couch x2 in last day and unable to get back up. Work up revealed COVID +, Ddimer +, and mild troponin elevation. He was admitted from 10/26/23 to 10/29/23.  Referred to outpt PT to assist with his recovery of function PAIN:  Are you having pain? No  PRECAUTIONS: Fall  RED FLAGS: None   WEIGHT BEARING RESTRICTIONS: No  FALLS:  Has patient fallen in last 6 months? No  LIVING ENVIRONMENT: Lives with: lives with their spouse Lives in: House/apartment Stairs: has interior stairs but doesn't use Has following equipment at home: None  OCCUPATION: retired  PLOF: Independent  PATIENT GOALS: The patient wants to have better stability  NEXT MD VISIT: December 2025  OBJECTIVE:  Note: Objective measures were completed at Evaluation unless otherwise noted.  DIAGNOSTIC FINDINGS: na   COGNITION: Overall cognitive status: Within functional limits for tasks assessed     SENSATION: WFL  EDEMA:  None noted  POSTURE: wide base of support noted in standing   LOWER EXTREMITY MNF:hmnddob wnl   LOWER EXTREMITY MMT:all MMT 4/5 except B plantarflexion 3+/5 R shoulder IR and ER 4/5 with shaking, tremulous movement pattern   FUNCTIONAL TESTS:  5 times sit to stand: 16.06 sec Berg 42/56 Gait speed, 3 M in 6.5 sec:  0.46 m/sec Gait with horizontal head turns, up /down turns, eyes closed, all wnl.  Gait backwards very slow, unsteady  GAIT: Distance walked: in  clnic up to 90 ' Assistive device utilized: None Level of assistance: Modified independence Comments: wide base of support, shortened stance time on L arms mid guard, very slow, decreased to no arm swing  TREATMENT DATE:  11/20/23:  Sit to stand with red mediball punches 4 x 5 reps Forefeet on 3 bar for B heel raises 2 x 10 Nustep L5 x 6 min Hamstring curls 25# 10 x 2 B knee ext 5#, 2 x 10 Forward step ups onto 4 step B hand hold 10 x each  Balance training: In ll bars:  Airex static standing Airex with B arm swings Forward lunges on bosu compliant side Standing alt hip abd and alt standing SLR with 2 #cuff wts each leg Walking on airex balance beam,  all of balance training mass practice  11/13/23 NuStep L5x50mins  Leg ext 5# 2x10 HS curls 20# 2x10 Calf raises 2x10 Calf stretch on bar 20s x2 STS with chest press 2x10 Walking on beam   11/08/23 LAQ 3# 2x10 HS curls green 2x10 Ball squeeze 2x10 NuStep L5x24mins  In bars hip flexion and abd  On airex balance and then eyes closed  Taps 4  STS 2x10   PATIENT EDUCATION:  Education details: POC, goals  Person educated: Patient Education method: Explanation, Demonstration, Tactile cues, Verbal cues, and Handouts Education comprehension: verbalized understanding, returned demonstration, verbal cues required, tactile cues required, and needs further education  HOME EXERCISE PROGRAM: Access Code: [email or web address] URL: [email or web address] Date: 11/08/2023 Prepared by: Almetta Fam  Exercises - Heel Toe Raises with Counter Support  - 2 x daily - 7 x weekly - 2 sets - 10 reps - Standing Hip Abduction with Counter Support  - 2 x daily - 7 x weekly - 2 sets - 10 reps - Standing March with Counter Support  - 2 x daily - 7 x weekly - 2 sets - 10 reps - Sit to Stand  - 2 x daily - 7 x weekly - 2  sets - 10 reps  ASSESSMENT:  CLINICAL IMPRESSION: Patient with overall improved ease of transfers, stability, greater gait speed.  Continued to progress with LE functional strength and stability, endurance, and balance training within the ll bars. Will continue to benefit from ongoing PT to meet his goals.    Patient is a 78 y.o. male who was evaluated today by physical therapy evaluation due to a recent decline in his overall strength, stability, mobility, and hospitalization due to COVID.  Per he and his wife he was declining in stamina and function for 1 -2 months prior to this hospitalization.  He is concerned about losing his balance/ fall risk.  He does demonstrate risk for falls with BERG score of 42/56 and with very slow, guarded gait speed.  Has less stability with isolated weight bearing on L LE vs R.  Vestibular screening wnl.  Should benefit from physical therapy with focused guided progression with endurance, functional strength and balance training.  OBJECTIVE IMPAIRMENTS: decreased activity tolerance, decreased balance, decreased endurance, decreased knowledge of condition, difficulty walking, decreased strength, and impaired perceived functional ability.   ACTIVITY LIMITATIONS: carrying, bending, standing, squatting, stairs, transfers, and locomotion level  PARTICIPATION LIMITATIONS: meal prep, cleaning, laundry, shopping, and community activity  PERSONAL FACTORS: Age, Behavior pattern, Time since onset of injury/illness/exacerbation, and 1-2 comorbidities: DM are also affecting patient's functional outcome.   REHAB POTENTIAL: Good  CLINICAL DECISION MAKING: Evolving/moderate complexity  EVALUATION COMPLEXITY: Moderate   GOALS: Goals reviewed with patient? Yes  SHORT TERM GOALS: Target date: 2 weeks, 11/20/23 I HEP Baseline: Goal status: INITIAL   LONG TERM GOALS: Target date: 8 weeks, 01/01/24  Berg improve from 42/56  to 45/56 or greater Baseline:  Goal status:  INITIAL  2.  Gait speed consistently greater than 0.8 m/sec Baseline:  Goal status: INITIAL  3.  5 x sit to stand 14.8 sec or less improved from 16.06 sec Baseline:  Goal status: INITIAL    PLAN:  PT FREQUENCY: 2x/week  PT DURATION: 8 weeks  PLANNED INTERVENTIONS: 97110-Therapeutic exercises, 97530- Therapeutic activity, 97112- Neuromuscular re-education, 97535- Self Care, 02859- Manual therapy, and 97116- Gait training  PLAN FOR NEXT SESSION: cardiovascular equipment, bulk strengthening B LE's and Ue's , trunk, balance progression   Dyllan Kats L Rushawn Capshaw, PT, DPT, OCS 11/20/2023, 12:46 PM

## 2023-11-21 NOTE — Therapy (Signed)
 OUTPATIENT PHYSICAL THERAPY LOWER EXTREMITY TREATMENT   Patient Name: Clifford [name] MRN: [id number] DOB:October 29, 1945, 77 y.o., male Today's Date: 11/22/2023  END OF SESSION:  PT End of Session - 11/22/23 1051     Visit Number 5    Date for Recertification  01/01/24    Progress Note Due on Visit 10    PT Start Time 1055    PT Stop Time 1140    PT Time Calculation (min) 45 min    Activity Tolerance Patient tolerated treatment well    Behavior During Therapy [facility] for tasks assessed/performed              Past Medical History:  Diagnosis Date   CAD (coronary artery disease) 02/27/2003   a. Previous stents RCA and circ, Cath 2005 patient with 70% PDA  //  b. [place]  5/17 >> LHC oLCx 50, pLAD 50, mLAD 95, RPDA 80, normal LVEF >> s/p CABG   Cancer (HCC)    Carotid artery disease    a. Carotid US  5/17 bilat ICA 1-39%   Cataract    replace 11/2012   Diabetes mellitus    diet controlled   History of detached retina repair 11/06/2006   [facility]   History of echocardiogram    a. EF 55% to 60%. Wall motion was normal, Gr 1 DD, Trivial MR/TR   History of hematuria    History of prostate cancer 03/29/2005   s/p prostatectomy   Hyperlipidemia    Hypertension    Past Surgical History:  Procedure Laterality Date   ANGIOPLASTY     1995,2000,2002   CARDIAC CATHETERIZATION  07/26/2000   Initially the stenosis in the proximal to mid right coronary artery was estimated ar 95%. Following stenting, this improved to 0%. There was residual mild disease in the sostium of the proximal right coronary atrtery. Successful stentng of the proximal to mid right coronary artery with improvement in stent diameter narrowing from 95% to 0% Proc Date  07/29/2000   CARDIAC CATHETERIZATION N/A 07/04/2015   Procedure: Left Heart Cath and Coronary Angiography;  Surgeon: Candyce GORMAN Reek, MD;  Location: [facility] INVASIVE CV LAB;  Service: Cardiovascular;  Laterality: N/A;   CATARACT EXTRACTION W/ INTRAOCULAR  LENS IMPLANT Bilateral OS Sept '14; OD Oct '14   Dr. camillo   COLONOSCOPY  04/12/2015   Dr.Gessner   CORONARY ARTERY BYPASS GRAFT N/A 07/08/2015   Procedure: CORONARY ARTERY BYPASS GRAFTING (CABG)x three using left internal mammary artery to left anterior decending artery, left greater saphenous vein grafts to diagonal and posterolateral. Left leg greater saphenous leg vein via endoscope. ;  Surgeon: Dallas KATHEE Jude, MD;  Location: [facility] OR;  Service: Open Heart Surgery;  Laterality: N/A;   CYSTOSCOPY N/A 07/08/2015   Procedure: CYSTOSCOPY FLEXIBLE WITH BALLOON DILATION OF BLADDER NECK CONTRACTURE WITH DIFFICULT CATHETER PLACEMENT;  Surgeon: Gretel Ferrara, MD;  Location: [facility] OR;  Service: Urology;  Laterality: N/A;   EYE SURGERY  11/24/2012   left eye cataract   EYE SURGERY  12/22/2012   right eye cataract   HEMORRHOID SURGERY     I&D of thrombosed hemorrhoid   LIPOMA EXCISION     POLYPECTOMY     PROSTATECTOMY  03/29/2005   PTCA  02/27/2003   TEE WITHOUT CARDIOVERSION N/A 07/08/2015   Procedure: TRANSESOPHAGEAL ECHOCARDIOGRAM (TEE);  Surgeon: Dallas KATHEE Jude, MD;  Location: [facility] OR;  Service: Open Heart Surgery;  Laterality: N/A;   WRIST GANGLION EXCISION     Patient Active Problem List  Diagnosis Date Noted   COVID-19 10/27/2023   Pneumonia due to COVID-19 virus 10/27/2023   Acute hypoxic respiratory failure (HCC) 10/27/2023   Elevated troponin 10/27/2023   Lumbar pain 06/03/2023   Weakness of both legs 06/03/2023   Constipation 12/30/2020   CKD (chronic kidney disease) stage 3, GFR 30-59 ml/min (HCC) 08/01/2015   Carotid artery disease    Hyperlipidemia associated with type 2 diabetes mellitus (HCC) 07/03/2015   Hypertensive heart disease 07/03/2015   Routine health maintenance 12/11/2010   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 Was a little sore after first session but doing good today.   PERTINENT HISTORY: The pt is an 78 yo male presenting 8/30 with SpO2 89% on RA and SOB, wife also reports pt sliding off couch x2 in last day and unable to get back up. Work up revealed COVID +, Ddimer +, and mild troponin elevation. He was admitted from 10/26/23 to 10/29/23.  Referred to outpt PT to assist with his recovery of function PAIN:  Are you having pain? No  PRECAUTIONS: Fall  RED FLAGS: None   WEIGHT BEARING RESTRICTIONS: No  FALLS:  Has patient fallen in last 6 months? No  LIVING ENVIRONMENT: Lives with: lives with their spouse Lives in: House/apartment Stairs: has interior stairs but doesn't use Has following equipment at home: None  OCCUPATION: retired  PLOF: Independent  PATIENT GOALS: The patient wants to have better stability  NEXT MD VISIT: December 2025  OBJECTIVE:  Note: Objective measures were completed at Evaluation unless otherwise noted.  DIAGNOSTIC FINDINGS: na   COGNITION: Overall cognitive status: Within functional limits for tasks assessed     SENSATION: WFL  EDEMA:  None noted  POSTURE: wide base of support noted in standing   LOWER EXTREMITY MNF:hmnddob wnl   LOWER EXTREMITY MMT:all MMT 4/5 except B plantarflexion 3+/5 R shoulder IR and ER 4/5 with shaking, tremulous movement pattern   FUNCTIONAL TESTS:  5 times sit to stand: 16.06 sec Berg 42/56 Gait speed, 3 M in 6.5 sec:  0.46 m/sec Gait with horizontal head turns, up /down turns, eyes closed, all wnl.  Gait backwards very slow, unsteady  GAIT: Distance walked: in  clnic up to 90 ' Assistive device utilized: None Level of assistance: Modified independence Comments: wide base of support, shortened stance time on L arms mid guard, very slow, decreased to no arm swing  TREATMENT DATE:  11/22/23 Bike L2 x68mins  On airex balance eyes open and closed Marching on airex Reaching for numbers on airex Cone taps on airex Tandem 30s  SLS in bars 5s at best Step ups 6 STS goal- 12s Shoulder ext 5# 2x10 Leg press 20# x10, 30# x10   11/20/23:  Sit to stand with red mediball punches 4 x 5 reps Forefeet on 3 bar for B heel raises 2 x 10 Nustep L5 x 6 min Hamstring curls 25# 10 x 2 B knee ext 5#, 2 x 10 Forward step ups onto 4 step B hand hold 10 x each  Balance training: In ll bars:  Airex static standing Airex with B arm swings Forward lunges on bosu compliant side Standing alt hip abd and alt standing SLR with 2 #cuff wts each leg Walking on airex balance beam,  all of balance training mass practice  11/13/23 NuStep L5x60mins  Leg ext 5# 2x10 HS curls 20# 2x10 Calf raises 2x10 Calf stretch on bar 20s x2 STS with chest press 2x10 Walking on beam   11/08/23 LAQ 3# 2x10 HS curls green 2x10 Ball squeeze 2x10 NuStep L5x74mins  In bars hip flexion and abd  On airex balance and then eyes closed  Taps 4  STS 2x10   PATIENT EDUCATION:  Education details: POC, goals  Person educated: Patient Education method: Explanation, Demonstration, Tactile cues, Verbal cues, and Handouts Education comprehension: verbalized understanding, returned demonstration, verbal cues required, tactile cues required, and needs further education  HOME EXERCISE PROGRAM: Access Code: [email or web address] URL: [email or web address] Date: 11/08/2023 Prepared by: Almetta Fam  Exercises - Heel Toe Raises with Counter Support  - 2 x daily  - 7 x weekly - 2 sets - 10 reps - Standing Hip Abduction with Counter Support  - 2 x daily - 7 x weekly - 2 sets - 10 reps - Standing March with Counter Support  - 2 x daily - 7 x weekly - 2 sets - 10 reps - Sit to Stand  - 2 x daily - 7 x weekly - 2 sets - 10 reps -Tandem and SLS    ASSESSMENT:  CLINICAL IMPRESSION: Patient with overall improved ease of transfers, stability, greater gait speed. Continued to progress with LE functional strength and stability, endurance, and balance training within parallel bars. SLS was the hardest thing for him, able to get ~5s on each side. Was advised to practice tandem and SLS at home in front of a counter. Added in a goal for SLS > 10s. Some fatigue with step ups on 6, requires CGA to prevent LOB. Will continue to benefit from ongoing PT to meet his goals.    Patient is a 78 y.o. male who was evaluated today by physical therapy evaluation due to a recent decline in his overall strength, stability, mobility, and hospitalization due to COVID.  Per he and his wife he was declining in stamina and function for 1 -2 months prior to this hospitalization.  He is concerned about losing his balance/ fall risk.  He does demonstrate risk for falls with BERG score of 42/56 and with very slow, guarded gait speed.  Has less stability with isolated weight bearing on L LE vs R.  Vestibular screening wnl.  Should benefit from physical therapy with focused guided progression with endurance, functional strength and balance training.  OBJECTIVE IMPAIRMENTS: decreased activity tolerance, decreased balance, decreased endurance, decreased knowledge of condition, difficulty walking, decreased strength, and impaired perceived functional ability.  ACTIVITY LIMITATIONS: carrying, bending, standing, squatting, stairs, transfers, and locomotion level  PARTICIPATION LIMITATIONS: meal prep, cleaning, laundry, shopping, and community activity  PERSONAL FACTORS: Age, Behavior pattern,  Time since onset of injury/illness/exacerbation, and 1-2 comorbidities: DM are also affecting patient's functional outcome.   REHAB POTENTIAL: Good  CLINICAL DECISION MAKING: Evolving/moderate complexity  EVALUATION COMPLEXITY: Moderate   GOALS: Goals reviewed with patient? Yes  SHORT TERM GOALS: Target date: 2 weeks, 11/20/23 I HEP Baseline: Goal status: MET 11/22/23   LONG TERM GOALS: Target date: 8 weeks, 01/01/24  Berg improve from 42/56  to 45/56 or greater Baseline:  Goal status: INITIAL  2.  Gait speed consistently greater than 0.8 m/sec Baseline:  Goal status: INITIAL  3.  5 x sit to stand 14.8 sec or less improved from 16.06 sec Baseline:  Goal status: 12s MET 11/22/23  4. Single leg stance >10s BLE  Baseline:   Goal status: INITIAL   PLAN:  PT FREQUENCY: 2x/week  PT DURATION: 8 weeks  PLANNED INTERVENTIONS: 97110-Therapeutic exercises, 97530- Therapeutic activity, [id number]- Neuromuscular re-education, 97535- Self Care, 02859- Manual therapy, and 97116- Gait training  PLAN FOR NEXT SESSION: cardiovascular equipment, bulk strengthening B LE's and Ue's , trunk, balance progression   Almetta Fam, PT, DPT 11/22/2023, 11:37 AM

## 2023-11-22 ENCOUNTER — Ambulatory Visit

## 2023-11-22 DIAGNOSIS — R262 Difficulty in walking, not elsewhere classified: Secondary | ICD-10-CM | POA: Diagnosis not present

## 2023-11-22 DIAGNOSIS — M6281 Muscle weakness (generalized): Secondary | ICD-10-CM

## 2023-11-25 ENCOUNTER — Ambulatory Visit

## 2023-11-25 DIAGNOSIS — R262 Difficulty in walking, not elsewhere classified: Secondary | ICD-10-CM | POA: Diagnosis not present

## 2023-11-25 DIAGNOSIS — M6281 Muscle weakness (generalized): Secondary | ICD-10-CM

## 2023-11-25 NOTE — Therapy (Signed)
 OUTPATIENT PHYSICAL THERAPY LOWER EXTREMITY TREATMENT   Patient Name: Clifford [name] MRN: [id number] DOB:January 03, 1946, 78 y.o., male Today's Date: 11/25/2023  END OF SESSION:  PT End of Session - 11/25/23 0819     Visit Number 6    Date for Recertification  01/01/24    Progress Note Due on Visit 10    PT Start Time 0805    PT Stop Time 0845    PT Time Calculation (min) 40 min    Activity Tolerance Patient tolerated treatment well    Behavior During Therapy [facility] for tasks assessed/performed               Past Medical History:  Diagnosis Date   CAD (coronary artery disease) 02/27/2003   a. Previous stents RCA and circ, Cath 2005 patient with 70% PDA  //  b. [place]  5/17 >> LHC oLCx 50, pLAD 50, mLAD 95, RPDA 80, normal LVEF >> s/p CABG   Cancer (HCC)    Carotid artery disease    a. Carotid US  5/17 bilat ICA 1-39%   Cataract    replace 11/2012   Diabetes mellitus    diet controlled   History of detached retina repair 11/06/2006   [facility]   History of echocardiogram    a. EF 55% to 60%. Wall motion was normal, Gr 1 DD, Trivial MR/TR   History of hematuria    History of prostate cancer 03/29/2005   s/p prostatectomy   Hyperlipidemia    Hypertension    Past Surgical History:  Procedure Laterality Date   ANGIOPLASTY     1995,2000,2002   CARDIAC CATHETERIZATION  07/26/2000   Initially the stenosis in the proximal to mid right coronary artery was estimated ar 95%. Following stenting, this improved to 0%. There was residual mild disease in the sostium of the proximal right coronary atrtery. Successful stentng of the proximal to mid right coronary artery with improvement in stent diameter narrowing from 95% to 0% Proc Date  07/29/2000   CARDIAC CATHETERIZATION N/A 07/04/2015   Procedure: Left Heart Cath and Coronary Angiography;  Surgeon: Candyce GORMAN Reek, MD;  Location: [facility] INVASIVE CV LAB;  Service: Cardiovascular;  Laterality: N/A;   CATARACT EXTRACTION W/  INTRAOCULAR LENS IMPLANT Bilateral OS Sept '14; OD Oct '14   Dr. camillo   COLONOSCOPY  04/12/2015   Dr.Gessner   CORONARY ARTERY BYPASS GRAFT N/A 07/08/2015   Procedure: CORONARY ARTERY BYPASS GRAFTING (CABG)x three using left internal mammary artery to left anterior decending artery, left greater saphenous vein grafts to diagonal and posterolateral. Left leg greater saphenous leg vein via endoscope. ;  Surgeon: Dallas KATHEE Jude, MD;  Location: [facility] OR;  Service: Open Heart Surgery;  Laterality: N/A;   CYSTOSCOPY N/A 07/08/2015   Procedure: CYSTOSCOPY FLEXIBLE WITH BALLOON DILATION OF BLADDER NECK CONTRACTURE WITH DIFFICULT CATHETER PLACEMENT;  Surgeon: Gretel Ferrara, MD;  Location: [facility] OR;  Service: Urology;  Laterality: N/A;   EYE SURGERY  11/24/2012   left eye cataract   EYE SURGERY  12/22/2012   right eye cataract   HEMORRHOID SURGERY     I&D of thrombosed hemorrhoid   LIPOMA EXCISION     POLYPECTOMY     PROSTATECTOMY  03/29/2005   PTCA  02/27/2003   TEE WITHOUT CARDIOVERSION N/A 07/08/2015   Procedure: TRANSESOPHAGEAL ECHOCARDIOGRAM (TEE);  Surgeon: Dallas KATHEE Jude, MD;  Location: [facility] OR;  Service: Open Heart Surgery;  Laterality: N/A;   WRIST GANGLION EXCISION     Patient Active Problem  List   Diagnosis Date Noted   COVID-19 10/27/2023   Pneumonia due to COVID-19 virus 10/27/2023   Acute hypoxic respiratory failure (HCC) 10/27/2023   Elevated troponin 10/27/2023   Lumbar pain 06/03/2023   Weakness of both legs 06/03/2023   Constipation 12/30/2020   CKD (chronic kidney disease) stage 3, GFR 30-59 ml/min (HCC) 08/01/2015   Carotid artery disease    Hyperlipidemia associated with type 2 diabetes mellitus (HCC) 07/03/2015   Hypertensive heart disease 07/03/2015   Routine health maintenance 12/11/2010   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 I am better overall since I started here with PT but not back to the level of efficiency that I am used to.   PERTINENT HISTORY: The pt is an 78 yo male presenting 8/30 with SpO2 89% on RA and SOB, wife also reports pt sliding off couch x2 in last day and unable to get back up. Work up revealed COVID +, Ddimer +, and mild troponin elevation. He was admitted from 10/26/23 to 10/29/23.  Referred to outpt PT to assist with his recovery of function PAIN:  Are you having pain? No  PRECAUTIONS: Fall  RED FLAGS: None   WEIGHT BEARING RESTRICTIONS: No  FALLS:  Has patient fallen in last 6 months? No  LIVING ENVIRONMENT: Lives with: lives with their spouse Lives in: House/apartment Stairs: has interior stairs but doesn't use Has following equipment at home: None  OCCUPATION: retired  PLOF: Independent  PATIENT GOALS: The patient wants to have better stability  NEXT MD VISIT: December 2025  OBJECTIVE:  Note: Objective measures were completed at Evaluation unless otherwise noted.  DIAGNOSTIC FINDINGS: na   COGNITION: Overall cognitive status: Within functional limits for tasks assessed     SENSATION: WFL  EDEMA:  None noted  POSTURE: wide base of support noted in standing   LOWER EXTREMITY MNF:hmnddob wnl   LOWER EXTREMITY MMT:all MMT 4/5 except B plantarflexion 3+/5 R shoulder IR and ER 4/5 with shaking, tremulous movement pattern   FUNCTIONAL TESTS:  5 times sit to stand: 16.06 sec Berg 42/56 Gait speed, 3 M in 6.5 sec:  0.46 m/sec Gait with  horizontal head turns, up /down turns, eyes closed, all wnl.  Gait backwards very slow, unsteady  GAIT: Distance walked: in clnic up to 90 ' Assistive device utilized: None Level of assistance: Modified independence Comments: wide base of support, shortened stance time on L arms mid guard, very slow, decreased to no arm swing  TREATMENT DATE:   11/25/23:  Bike L 2 x 6 min  In ll bars, lunges on compliant side of BOSU 2 x 15 each leg In ll bars, heel/ toe raises on airex In ll bars traveling side to side on airex In ll bars for 3 way hip 2# cuff wts one set 10 each leg  In ll bars side stepping length of bars 2# cuff weights 5 laps In ll bars for semi tandem standing 30 sec holds  In ll bars for forward heel taps 15 reps each leg form 2 step Knee ext 5# 10 x   11/22/23 Bike L2 x51mins  On airex balance eyes open and closed Marching on airex Reaching for numbers on airex Cone taps on airex Tandem 30s  SLS in bars 5s at best Step ups 6 STS goal- 12s Shoulder ext 5# 2x10 Leg press 20# x10, 30# x10   11/20/23:  Sit to stand with red mediball punches 4 x 5 reps Forefeet on 3 bar for B heel raises 2 x 10 Nustep L5 x 6 min Hamstring curls 25# 10 x 2 B knee ext 5#, 2 x 10 Forward step ups onto 4 step B hand hold 10 x each  Balance training: In ll bars:  Airex static standing Airex with B arm swings Forward lunges on bosu compliant side Standing alt hip abd and alt standing SLR with 2 #cuff wts each leg Walking on airex balance beam,  all of balance training mass practice  11/13/23 NuStep L5x28mins  Leg ext 5# 2x10 HS curls 20# 2x10 Calf raises 2x10 Calf stretch on bar 20s x2 STS with chest press 2x10 Walking on beam   11/08/23 LAQ 3# 2x10 HS curls green 2x10 Ball squeeze 2x10 NuStep L5x32mins  In bars hip flexion and abd  On airex  balance and then eyes closed  Taps 4  STS 2x10   PATIENT EDUCATION:  Education details: POC, goals  Person educated: Patient Education method: Explanation, Demonstration, Tactile cues, Verbal cues, and Handouts Education comprehension: verbalized understanding, returned demonstration, verbal cues required, tactile cues required, and needs further education  HOME EXERCISE PROGRAM: Access Code: [email or web address] URL: [email or web address] Date: 11/08/2023 Prepared by: Almetta Fam  Exercises - Heel Toe Raises with Counter Support  - 2 x daily - 7 x weekly - 2 sets - 10 reps - Standing Hip Abduction with Counter Support  - 2 x daily - 7 x weekly - 2 sets - 10 reps - Standing March with Counter Support  - 2 x daily - 7 x weekly - 2 sets - 10 reps - Sit to Stand  - 2 x daily - 7 x weekly - 2 sets - 10 reps -Tandem and SLS    ASSESSMENT:  CLINICAL IMPRESSION: Patient continues with improved ease of transfers, stability, greater gait speed. Focused more on hip and proximal stability today, required CGA several times to prevent LOB. Endurance better, less frequent rest breaks required.  Will continue to benefit from ongoing PT to meet his goals.    Patient is a 79 y.o. male who was evaluated today by physical therapy evaluation due to a recent decline in his overall strength, stability, mobility, and hospitalization due to COVID.  Per he and his wife he was declining in stamina and function for 1 -2 months prior to this hospitalization.  He is concerned about losing his balance/ fall risk.  He does demonstrate risk for falls with BERG score of 42/56 and with very slow, guarded  gait speed.  Has less stability with isolated weight bearing on L LE vs R.  Vestibular screening wnl.  Should benefit from physical therapy with focused guided progression with endurance, functional strength and balance training.  OBJECTIVE IMPAIRMENTS: decreased activity tolerance, decreased balance, decreased  endurance, decreased knowledge of condition, difficulty walking, decreased strength, and impaired perceived functional ability.   ACTIVITY LIMITATIONS: carrying, bending, standing, squatting, stairs, transfers, and locomotion level  PARTICIPATION LIMITATIONS: meal prep, cleaning, laundry, shopping, and community activity  PERSONAL FACTORS: Age, Behavior pattern, Time since onset of injury/illness/exacerbation, and 1-2 comorbidities: DM are also affecting patient's functional outcome.   REHAB POTENTIAL: Good  CLINICAL DECISION MAKING: Evolving/moderate complexity  EVALUATION COMPLEXITY: Moderate   GOALS: Goals reviewed with patient? Yes  SHORT TERM GOALS: Target date: 2 weeks, 11/20/23 I HEP Baseline: Goal status: MET 11/22/23   LONG TERM GOALS: Target date: 8 weeks, 01/01/24  Berg improve from 42/56  to 45/56 or greater Baseline:  Goal status: INITIAL  2.  Gait speed consistently greater than 0.8 m/sec Baseline:  Goal status: INITIAL  3.  5 x sit to stand 14.8 sec or less improved from 16.06 sec Baseline:  Goal status: 12s MET 11/22/23  4. Single leg stance >10s BLE  Baseline:   Goal status: INITIAL   PLAN:  PT FREQUENCY: 2x/week  PT DURATION: 8 weeks  PLANNED INTERVENTIONS: 97110-Therapeutic exercises, 97530- Therapeutic activity, [id number]- Neuromuscular re-education, 97535- Self Care, [id number]- Manual therapy, and 97116- Gait training  PLAN FOR NEXT SESSION: cardiovascular equipment, bulk strengthening B LE's and Ue's , trunk, balance progression   Analea Muller L Ariadna Setter, PT, DPT,OCS 11/25/2023, 8:48 AM

## 2023-11-27 ENCOUNTER — Ambulatory Visit

## 2023-11-29 ENCOUNTER — Ambulatory Visit: Attending: Internal Medicine

## 2023-11-29 ENCOUNTER — Other Ambulatory Visit: Payer: Self-pay

## 2023-11-29 DIAGNOSIS — R262 Difficulty in walking, not elsewhere classified: Secondary | ICD-10-CM | POA: Insufficient documentation

## 2023-11-29 DIAGNOSIS — M6281 Muscle weakness (generalized): Secondary | ICD-10-CM | POA: Insufficient documentation

## 2023-11-29 NOTE — Therapy (Signed)
 OUTPATIENT PHYSICAL THERAPY LOWER EXTREMITY TREATMENT   Patient Name: Clifford [name] MRN: [id number] DOB:[birth year], 78 y.o., male Today's Date: 11/29/2023  END OF SESSION:  PT End of Session - 11/29/23 0850     Visit Number 7    Date for Recertification  01/01/24    PT Start Time 0847    PT Stop Time 0928    PT Time Calculation (min) 41 min    Activity Tolerance Patient tolerated treatment well    Behavior During Therapy [facility] for tasks assessed/performed                Past Medical History:  Diagnosis Date   CAD (coronary artery disease) 02/27/2003   a. Previous stents RCA and circ, Cath 2005 patient with 70% PDA  //  b. [place]  5/17 >> LHC oLCx 50, pLAD 50, mLAD 95, RPDA 80, normal LVEF >> s/p CABG   Cancer (HCC)    Carotid artery disease    a. Carotid US  5/17 bilat ICA 1-39%   Cataract    replace 11/2012   Diabetes mellitus    diet controlled   History of detached retina repair 11/06/2006   [facility]   History of echocardiogram    a. EF 55% to 60%. Wall motion was normal, Gr 1 DD, Trivial MR/TR   History of hematuria    History of prostate cancer 03/29/2005   s/p prostatectomy   Hyperlipidemia    Hypertension    Past Surgical History:  Procedure Laterality Date   ANGIOPLASTY     1995,2000,2002   CARDIAC CATHETERIZATION  07/26/2000   Initially the stenosis in the proximal to mid right coronary artery was estimated ar 95%. Following stenting, this improved to 0%. There was residual mild disease in the sostium of the proximal right coronary atrtery. Successful stentng of the proximal to mid right coronary artery with improvement in stent diameter narrowing from 95% to 0% Proc Date  07/29/2000   CARDIAC CATHETERIZATION N/A 07/04/2015   Procedure: Left Heart Cath and Coronary Angiography;  Surgeon: Candyce GORMAN Reek, MD;  Location: [facility] INVASIVE CV LAB;  Service: Cardiovascular;  Laterality: N/A;   CATARACT EXTRACTION W/ INTRAOCULAR LENS IMPLANT Bilateral OS Sept  '14; OD Oct '14   Dr. camillo   COLONOSCOPY  04/12/2015   Dr.Gessner   CORONARY ARTERY BYPASS GRAFT N/A 07/08/2015   Procedure: CORONARY ARTERY BYPASS GRAFTING (CABG)x three using left internal mammary artery to left anterior decending artery, left greater saphenous vein grafts to diagonal and posterolateral. Left leg greater saphenous leg vein via endoscope. ;  Surgeon: Dallas KATHEE Jude, MD;  Location: [facility] OR;  Service: Open Heart Surgery;  Laterality: N/A;   CYSTOSCOPY N/A 07/08/2015   Procedure: CYSTOSCOPY FLEXIBLE WITH BALLOON DILATION OF BLADDER NECK CONTRACTURE WITH DIFFICULT CATHETER PLACEMENT;  Surgeon: Gretel Ferrara, MD;  Location: [facility] OR;  Service: Urology;  Laterality: N/A;   EYE SURGERY  11/24/2012   left eye cataract   EYE SURGERY  12/22/2012   right eye cataract   HEMORRHOID SURGERY     I&D of thrombosed hemorrhoid   LIPOMA EXCISION     POLYPECTOMY     PROSTATECTOMY  03/29/2005   PTCA  02/27/2003   TEE WITHOUT CARDIOVERSION N/A 07/08/2015   Procedure: TRANSESOPHAGEAL ECHOCARDIOGRAM (TEE);  Surgeon: Dallas KATHEE Jude, MD;  Location: [facility] OR;  Service: Open Heart Surgery;  Laterality: N/A;   WRIST GANGLION EXCISION     Patient Active Problem List   Diagnosis Date Noted  COVID-19 10/27/2023   Pneumonia due to COVID-19 virus 10/27/2023   Acute hypoxic respiratory failure (HCC) 10/27/2023   Elevated troponin 10/27/2023   Lumbar pain 06/03/2023   Weakness of both legs 06/03/2023   Constipation 12/30/2020   CKD (chronic kidney disease) stage 3, GFR 30-59 ml/min (HCC) 08/01/2015   Carotid artery disease    Hyperlipidemia associated with type 2 diabetes mellitus (HCC) 07/03/2015   Hypertensive heart disease 07/03/2015   Routine health maintenance 12/11/2010   Diabetes mellitus type 2 with complications (HCC) 07/23/2007   Essential hypertension 03/07/2007   Coronary atherosclerosis 03/07/2007   PROSTATE CANCER, HX OF 03/07/2007    PCP: Rollene Norris, MD  REFERRING  PROVIDER: Darci Pore, MD  REFERRING DIAG: dyspnea upon exertion  THERAPY DIAG:  Muscle weakness (generalized)  Difficulty in walking, not elsewhere classified  Rationale for Evaluation and Treatment: Rehabilitation  ONSET DATE: 10/26/23  SUBJECTIVE:   SUBJECTIVE STATEMENT:  reports ease of transfers more so since initiating PT and therex.  I feel a little sore, have been working on standing on one foot a lot this weekend. I am better overall since I started here with PT but not back to the level of efficiency that I am used to.   PERTINENT HISTORY: The pt is an 78 yo male presenting 8/30 with SpO2 89% on RA and SOB, wife also reports pt sliding off couch x2 in last day and unable to get back up. Work up revealed COVID +, Ddimer +, and mild troponin elevation. He was admitted from 10/26/23 to 10/29/23.  Referred to outpt PT to assist with his recovery of function PAIN:  Are you having pain? No  PRECAUTIONS: Fall  RED FLAGS: None   WEIGHT BEARING RESTRICTIONS: No  FALLS:  Has patient fallen in last 6 months? No  LIVING ENVIRONMENT: Lives with: lives with their spouse Lives in: House/apartment Stairs: has interior stairs but doesn't use Has following equipment at home: None  OCCUPATION: retired  PLOF: Independent  PATIENT GOALS: The patient wants to have better stability  NEXT MD VISIT: December 2025  OBJECTIVE:  Note: Objective measures were completed at Evaluation unless otherwise noted.  DIAGNOSTIC FINDINGS: na   COGNITION: Overall cognitive status: Within functional limits for tasks assessed     SENSATION: WFL  EDEMA:  None noted  POSTURE: wide base of support noted in standing   LOWER EXTREMITY MNF:hmnddob wnl   LOWER EXTREMITY MMT:all MMT 4/5 except B plantarflexion 3+/5 R shoulder IR and ER 4/5 with shaking, tremulous movement pattern   FUNCTIONAL TESTS:  5 times sit to stand: 16.06 sec Berg 42/56 Gait speed, 3 M in 6.5 sec:  0.46  m/sec Gait with horizontal head turns, up /down turns, eyes closed, all wnl.  Gait backwards very slow, unsteady  GAIT: Distance walked: in clnic up to 90 ' Assistive device utilized: None Level of assistance: Modified independence Comments: wide base of support, shortened stance time on L arms mid guard, very slow, decreased to no arm swing  TREATMENT DATE:  11/29/23:  In ll bars for lunges on compliant side of BOSU no UE  In ll bars for heel/toe rocks on airex In ll bars for side stepping length of bars, 4 laps, with 2# cuff weights B In ll bars for alt SLR 2# cuff wts ankles In ll bars for alt hip ext  Nustep L 5 x 6 min Hamstring curls B 20#, 10 x 2 B knee ext 5# 10 x 2 Gait on airex balance beam in ll bars, used hands for support High marches in ll bars with 5 to 7 sec holds Lateral step ups onto lower step, 4 10 each Alt forward toe taps to 6 step 20 reps  11/25/23:  Bike L 2 x 6 min  In ll bars, lunges on compliant side of BOSU 2 x 15 each leg In ll bars, heel/ toe raises on airex In ll bars traveling side to side on airex In ll bars for 3 way hip 2# cuff wts one set 10 each leg  In ll bars side stepping length of bars 2# cuff weights 5 laps In ll bars for semi tandem standing 30 sec holds  In ll bars for forward heel taps 15 reps each leg form 2 step Knee ext 5# 10 x   11/22/23 Bike L2 x40mins  On airex balance eyes open and closed Marching on airex Reaching for numbers on airex Cone taps on airex Tandem 30s  SLS in bars 5s at best Step ups 6 STS goal- 12s Shoulder ext 5# 2x10 Leg press 20# x10, 30# x10   11/20/23:  Sit to stand with red mediball punches 4 x 5 reps Forefeet on 3 bar for B heel raises 2 x 10 Nustep L5 x 6 min Hamstring curls 25# 10 x 2 B knee ext 5#, 2 x 10 Forward step ups onto 4 step B hand hold 10 x  each  Balance training: In ll bars:  Airex static standing Airex with B arm swings Forward lunges on bosu compliant side Standing alt hip abd and alt standing SLR with 2 #cuff wts each leg Walking on airex balance beam,  all of balance training mass practice  11/13/23 NuStep L5x10mins  Leg ext 5# 2x10 HS curls 20# 2x10 Calf raises 2x10 Calf stretch on bar 20s x2 STS with chest press 2x10 Walking on beam   11/08/23 LAQ 3# 2x10 HS curls green 2x10 Ball squeeze 2x10 NuStep L5x65mins  In bars hip flexion and abd  On airex balance and then eyes closed  Taps 4  STS 2x10   PATIENT EDUCATION:  Education details: POC, goals  Person educated: Patient Education method: Explanation, Demonstration, Tactile cues, Verbal cues, and Handouts Education comprehension: verbalized understanding, returned demonstration, verbal cues required, tactile cues required, and needs further education  HOME EXERCISE PROGRAM: Access Code: [email or web address] URL: [email or web address] Date: 11/08/2023 Prepared by: Almetta Fam  Exercises - Heel Toe Raises with Counter Support  - 2 x daily - 7 x weekly - 2 sets - 10 reps - Standing Hip Abduction with Counter Support  - 2 x daily - 7 x weekly - 2 sets - 10 reps - Standing March with Counter Support  - 2 x daily - 7 x weekly - 2 sets - 10 reps - Sit to Stand  - 2 x daily - 7 x weekly - 2 sets - 10 reps -Tandem and SLS    ASSESSMENT:  CLINICAL IMPRESSION: Patient returned for 7th PT intervention due to  compromised endurance, balance, safety after hospitalization with COVID, flu.  Reports improved ease of transfers at this time as compared to initial evaluation.  . Endurance better, continue to note less frequent rest breaks required. Gait speed still slower than normal. Will continue to benefit from ongoing PT to meet his goals.    Patient is a 78 y.o. male who was evaluated today by physical therapy evaluation due to a recent decline in his  overall strength, stability, mobility, and hospitalization due to COVID.  Per he and his wife he was declining in stamina and function for 1 -2 months prior to this hospitalization.  He is concerned about losing his balance/ fall risk.  He does demonstrate risk for falls with BERG score of 42/56 and with very slow, guarded gait speed.  Has less stability with isolated weight bearing on L LE vs R.  Vestibular screening wnl.  Should benefit from physical therapy with focused guided progression with endurance, functional strength and balance training.  OBJECTIVE IMPAIRMENTS: decreased activity tolerance, decreased balance, decreased endurance, decreased knowledge of condition, difficulty walking, decreased strength, and impaired perceived functional ability.   ACTIVITY LIMITATIONS: carrying, bending, standing, squatting, stairs, transfers, and locomotion level  PARTICIPATION LIMITATIONS: meal prep, cleaning, laundry, shopping, and community activity  PERSONAL FACTORS: Age, Behavior pattern, Time since onset of injury/illness/exacerbation, and 1-2 comorbidities: DM are also affecting patient's functional outcome.   REHAB POTENTIAL: Good  CLINICAL DECISION MAKING: Evolving/moderate complexity  EVALUATION COMPLEXITY: Moderate   GOALS: Goals reviewed with patient? Yes  SHORT TERM GOALS: Target date: 2 weeks, 11/20/23 I HEP Baseline: Goal status: MET 11/22/23   LONG TERM GOALS: Target date: 8 weeks, 01/01/24  Berg improve from 42/56  to 45/56 or greater Baseline:  Goal status: INITIAL  2.  Gait speed consistently greater than 0.8 m/sec Baseline:  Goal status: INITIAL  3.  5 x sit to stand 14.8 sec or less improved from 16.06 sec Baseline:  Goal status: 12s MET 11/22/23  4. Single leg stance >10s BLE  Baseline:   Goal status: INITIAL   PLAN:  PT FREQUENCY: 2x/week  PT DURATION: 8 weeks  PLANNED INTERVENTIONS: 97110-Therapeutic exercises, 97530- Therapeutic activity, [id number]-  Neuromuscular re-education, 97535- Self Care, [id number]- Manual therapy, and 97116- Gait training  PLAN FOR NEXT SESSION: cardiovascular equipment, bulk strengthening B LE's and Ue's , trunk, balance progression, should assess gait speed again next week   Haja Crego L Kestrel Mis, PT, DPT,OCS 11/29/2023, 10:24 AM

## 2023-12-02 ENCOUNTER — Ambulatory Visit

## 2023-12-02 ENCOUNTER — Other Ambulatory Visit: Payer: Self-pay

## 2023-12-02 DIAGNOSIS — R262 Difficulty in walking, not elsewhere classified: Secondary | ICD-10-CM

## 2023-12-02 DIAGNOSIS — M6281 Muscle weakness (generalized): Secondary | ICD-10-CM

## 2023-12-02 NOTE — Therapy (Signed)
 OUTPATIENT PHYSICAL THERAPY LOWER EXTREMITY TREATMENT   Patient Name: Clifford [name] MRN: [id number] DOB:12-Oct-1945, 78 y.o., male Today's Date: 12/02/2023  END OF SESSION:  PT End of Session - 12/02/23 0915     Visit Number 8    Date for Recertification  01/01/24    Progress Note Due on Visit 10    PT Start Time 0843    PT Stop Time 0928    PT Time Calculation (min) 45 min    Activity Tolerance Patient tolerated treatment well    Behavior During Therapy [facility] for tasks assessed/performed                 Past Medical History:  Diagnosis Date   CAD (coronary artery disease) 02/27/2003   a. Previous stents RCA and circ, Cath 2005 patient with 70% PDA  //  b. [place]  5/17 >> LHC oLCx 50, pLAD 50, mLAD 95, RPDA 80, normal LVEF >> s/p CABG   Cancer (HCC)    Carotid artery disease    a. Carotid US  5/17 bilat ICA 1-39%   Cataract    replace 11/2012   Diabetes mellitus    diet controlled   History of detached retina repair 11/06/2006   [facility]   History of echocardiogram    a. EF 55% to 60%. Wall motion was normal, Gr 1 DD, Trivial MR/TR   History of hematuria    History of prostate cancer 03/29/2005   s/p prostatectomy   Hyperlipidemia    Hypertension    Past Surgical History:  Procedure Laterality Date   ANGIOPLASTY     1995,2000,2002   CARDIAC CATHETERIZATION  07/26/2000   Initially the stenosis in the proximal to mid right coronary artery was estimated ar 95%. Following stenting, this improved to 0%. There was residual mild disease in the sostium of the proximal right coronary atrtery. Successful stentng of the proximal to mid right coronary artery with improvement in stent diameter narrowing from 95% to 0% Proc Date  07/29/2000   CARDIAC CATHETERIZATION N/A 07/04/2015   Procedure: Left Heart Cath and Coronary Angiography;  Surgeon: Candyce GORMAN Reek, MD;  Location: [facility] INVASIVE CV LAB;  Service: Cardiovascular;  Laterality: N/A;   CATARACT EXTRACTION W/  INTRAOCULAR LENS IMPLANT Bilateral OS Sept '14; OD Oct '14   Dr. camillo   COLONOSCOPY  04/12/2015   Dr.Gessner   CORONARY ARTERY BYPASS GRAFT N/A 07/08/2015   Procedure: CORONARY ARTERY BYPASS GRAFTING (CABG)x three using left internal mammary artery to left anterior decending artery, left greater saphenous vein grafts to diagonal and posterolateral. Left leg greater saphenous leg vein via endoscope. ;  Surgeon: Dallas KATHEE Jude, MD;  Location: [facility] OR;  Service: Open Heart Surgery;  Laterality: N/A;   CYSTOSCOPY N/A 07/08/2015   Procedure: CYSTOSCOPY FLEXIBLE WITH BALLOON DILATION OF BLADDER NECK CONTRACTURE WITH DIFFICULT CATHETER PLACEMENT;  Surgeon: Gretel Ferrara, MD;  Location: [facility] OR;  Service: Urology;  Laterality: N/A;   EYE SURGERY  11/24/2012   left eye cataract   EYE SURGERY  12/22/2012   right eye cataract   HEMORRHOID SURGERY     I&D of thrombosed hemorrhoid   LIPOMA EXCISION     POLYPECTOMY     PROSTATECTOMY  03/29/2005   PTCA  02/27/2003   TEE WITHOUT CARDIOVERSION N/A 07/08/2015   Procedure: TRANSESOPHAGEAL ECHOCARDIOGRAM (TEE);  Surgeon: Dallas KATHEE Jude, MD;  Location: [facility] OR;  Service: Open Heart Surgery;  Laterality: N/A;   WRIST GANGLION EXCISION     Patient  Active Problem List   Diagnosis Date Noted   COVID-19 10/27/2023   Pneumonia due to COVID-19 virus 10/27/2023   Acute hypoxic respiratory failure (HCC) 10/27/2023   Elevated troponin 10/27/2023   Lumbar pain 06/03/2023   Weakness of both legs 06/03/2023   Constipation 12/30/2020   CKD (chronic kidney disease) stage 3, GFR 30-59 ml/min (HCC) 08/01/2015   Carotid artery disease    Hyperlipidemia associated with type 2 diabetes mellitus (HCC) 07/03/2015   Hypertensive heart disease 07/03/2015   Routine health maintenance 12/11/2010   Diabetes mellitus type 2 with complications (HCC) 07/23/2007   Essential hypertension 03/07/2007   Coronary atherosclerosis 03/07/2007   PROSTATE CANCER, HX OF 03/07/2007     PCP: Rollene Norris, MD  REFERRING PROVIDER: Darci Pore, MD  REFERRING DIAG: dyspnea upon exertion  THERAPY DIAG:  Muscle weakness (generalized)  Difficulty in walking, not elsewhere classified  Rationale for Evaluation and Treatment: Rehabilitation  ONSET DATE: 10/26/23  SUBJECTIVE:   SUBJECTIVE STATEMENT:  hips sore from working on side to side motion.  R UE tremor more pronounced today and over the weekend than usual   PERTINENT HISTORY: The pt is an 78 yo male presenting 8/30 with SpO2 89% on RA and SOB, wife also reports pt sliding off couch x2 in last day and unable to get back up. Work up revealed COVID +, Ddimer +, and mild troponin elevation. He was admitted from 10/26/23 to 10/29/23.  Referred to outpt PT to assist with his recovery of function PAIN:  Are you having pain? No  PRECAUTIONS: Fall  RED FLAGS: None   WEIGHT BEARING RESTRICTIONS: No  FALLS:  Has patient fallen in last 6 months? No  LIVING ENVIRONMENT: Lives with: lives with their spouse Lives in: House/apartment Stairs: has interior stairs but doesn't use Has following equipment at home: None  OCCUPATION: retired  PLOF: Independent  PATIENT GOALS: The patient wants to have better stability  NEXT MD VISIT: December 2025  OBJECTIVE:  Note: Objective measures were completed at Evaluation unless otherwise noted.  DIAGNOSTIC FINDINGS: na   COGNITION: Overall cognitive status: Within functional limits for tasks assessed     SENSATION: WFL  EDEMA:  None noted  POSTURE: wide base of support noted in standing   LOWER EXTREMITY MNF:hmnddob wnl   LOWER EXTREMITY MMT:all MMT 4/5 except B plantarflexion 3+/5 R shoulder IR and ER 4/5 with shaking, tremulous movement pattern   FUNCTIONAL TESTS:  5 times sit to stand: 16.06 sec Berg 42/56 Gait speed, 3 M in 6.5 sec:  0.46 m/sec Gait with horizontal head turns, up /down turns, eyes closed, all wnl.  Gait backwards  very slow, unsteady  GAIT: Distance walked: in clnic up to 90 ' Assistive device utilized: None Level of assistance: Modified independence Comments: wide base of support, shortened stance time on L arms mid guard, very slow, decreased to no arm swing  TREATMENT DATE:  12/02/23:  Nustep L 5 x 6 min UE and Les Standing for lat step overs, transitioning on and off airex Standing with 2# cuff wts B ankles for alt SLR, alt hip abd, alt hip ext In ll bars for walking on airex balance beam 6 laps Sit to stand from hi low mat with forward punches with 2 # mediball 10x Standing tandem 30 sec holds Hamstring curls 25#, 3 x 10  Knee ext B 10# 3 x10 Lat step ups 4 step x 15 reps each side Assessed gait speed , walked 100'    11/29/23:  In ll bars for lunges on compliant side of BOSU no UE  In ll bars for heel/toe rocks on airex In ll bars for side stepping length of bars, 4 laps, with 2# cuff weights B In ll bars for alt SLR 2# cuff wts ankles In ll bars for alt hip ext  Nustep L 5 x 6 min Hamstring curls B 20#, 10 x 2 B knee ext 5# 10 x 2 Gait on airex balance beam in ll bars, used hands for support High marches in ll bars with 5 to 7 sec holds Lateral step ups onto lower step, 4 10 each Alt forward toe taps to 6 step 20 reps  11/25/23:  Bike L 2 x 6 min  In ll bars, lunges on compliant side of BOSU 2 x 15 each leg In ll bars, heel/ toe raises on airex In ll bars traveling side to side on airex In ll bars for 3 way hip 2# cuff wts one set 10 each leg  In ll bars side stepping length of bars 2# cuff weights 5 laps In ll bars for semi tandem standing 30 sec holds  In ll bars for forward heel taps 15 reps each leg form 2 step Knee ext 5# 10 x   11/22/23 Bike L2 x76mins  On airex balance eyes open and closed Marching on airex Reaching for numbers on  airex Cone taps on airex Tandem 30s  SLS in bars 5s at best Step ups 6 STS goal- 12s Shoulder ext 5# 2x10 Leg press 20# x10, 30# x10   11/20/23:  Sit to stand with red mediball punches 4 x 5 reps Forefeet on 3 bar for B heel raises 2 x 10 Nustep L5 x 6 min Hamstring curls 25# 10 x 2 B knee ext 5#, 2 x 10 Forward step ups onto 4 step B hand hold 10 x each  Balance training: In ll bars:  Airex static standing Airex with B arm swings Forward lunges on bosu compliant side Standing alt hip abd and alt standing SLR with 2 #cuff wts each leg Walking on airex balance beam,  all of balance training mass practice  11/13/23 NuStep L5x16mins  Leg ext 5# 2x10 HS curls 20# 2x10 Calf raises 2x10 Calf stretch on bar 20s x2 STS with chest press 2x10 Walking on beam   11/08/23 LAQ 3# 2x10 HS curls green 2x10 Ball squeeze 2x10 NuStep L5x12mins  In bars hip flexion and abd  On airex balance and then eyes closed  Taps 4  STS 2x10   PATIENT EDUCATION:  Education details: POC, goals  Person educated: Patient Education method: Explanation, Demonstration, Tactile cues, Verbal cues, and Handouts Education comprehension: verbalized understanding, returned demonstration, verbal cues required, tactile cues required, and needs further education  HOME EXERCISE PROGRAM: Access Code: [email or web address] URL: [email or web address] Date: 11/08/2023 Prepared by: Almetta Fam  Exercises - Heel  Toe Raises with Counter Support  - 2 x daily - 7 x weekly - 2 sets - 10 reps - Standing Hip Abduction with Counter Support  - 2 x daily - 7 x weekly - 2 sets - 10 reps - Standing March with Counter Support  - 2 x daily - 7 x weekly - 2 sets - 10 reps - Sit to Stand  - 2 x daily - 7 x weekly - 2 sets - 10 reps -Tandem and SLS    ASSESSMENT:  CLINICAL IMPRESSION: Patient returned for 8th PT intervention due to compromised endurance, balance, safety after hospitalization with COVID, flu.  Reports  improved ease of transfers at this time as compared to initial evaluation.  . Endurance better, continue to note less frequent rest breaks required. Gait speed still slower than normal, but improved from initial evaluation.  Will continue to benefit from ongoing PT to meet his goals.    Patient is a 78 y.o. male who was evaluated today by physical therapy evaluation due to a recent decline in his overall strength, stability, mobility, and hospitalization due to COVID.  Per he and his wife he was declining in stamina and function for 1 -2 months prior to this hospitalization.  He is concerned about losing his balance/ fall risk.  He does demonstrate risk for falls with BERG score of 42/56 and with very slow, guarded gait speed.  Has less stability with isolated weight bearing on L LE vs R.  Vestibular screening wnl.  Should benefit from physical therapy with focused guided progression with endurance, functional strength and balance training.  OBJECTIVE IMPAIRMENTS: decreased activity tolerance, decreased balance, decreased endurance, decreased knowledge of condition, difficulty walking, decreased strength, and impaired perceived functional ability.   ACTIVITY LIMITATIONS: carrying, bending, standing, squatting, stairs, transfers, and locomotion level  PARTICIPATION LIMITATIONS: meal prep, cleaning, laundry, shopping, and community activity  PERSONAL FACTORS: Age, Behavior pattern, Time since onset of injury/illness/exacerbation, and 1-2 comorbidities: DM are also affecting patient's functional outcome.   REHAB POTENTIAL: Good  CLINICAL DECISION MAKING: Evolving/moderate complexity  EVALUATION COMPLEXITY: Moderate   GOALS: Goals reviewed with patient? Yes  SHORT TERM GOALS: Target date: 2 weeks, 11/20/23 I HEP Baseline: Goal status: MET 11/22/23   LONG TERM GOALS: Target date: 8 weeks, 01/01/24  Berg improve from 42/56  to 45/56 or greater Baseline:  Goal status: INITIAL  2.  Gait speed  consistently greater than 0.8 m/sec Baseline:  Goal status: 0.51 m/sec  3.  5 x sit to stand 14.8 sec or less improved from 16.06 sec Baseline:  Goal status: 12s MET 11/22/23  4. Single leg stance >10s BLE  Baseline:   Goal status: INITIAL   PLAN:  PT FREQUENCY: 2x/week  PT DURATION: 8 weeks  PLANNED INTERVENTIONS: 97110-Therapeutic exercises, 97530- Therapeutic activity, [id number]- Neuromuscular re-education, 97535- Self Care, [id number]- Manual therapy, and [id number]- Gait training  PLAN FOR NEXT SESSION: cardiovascular equipment, bulk strengthening B LE's and Ue's , trunk, balance progression, should assess gait speed again next week   Lexus Shampine L Jessina Marse, PT, DPT,OCS 12/02/2023, 2:23 PM

## 2023-12-04 ENCOUNTER — Other Ambulatory Visit: Payer: Self-pay

## 2023-12-04 ENCOUNTER — Ambulatory Visit

## 2023-12-04 DIAGNOSIS — M6281 Muscle weakness (generalized): Secondary | ICD-10-CM

## 2023-12-04 DIAGNOSIS — R262 Difficulty in walking, not elsewhere classified: Secondary | ICD-10-CM

## 2023-12-04 NOTE — Therapy (Signed)
 OUTPATIENT PHYSICAL THERAPY LOWER EXTREMITY TREATMENT   Patient Name: Clifford [name] MRN: [id number] DOB:1945/06/04, 78 y.o., male Today's Date: 12/04/2023  END OF SESSION:  PT End of Session - 12/04/23 0840     Visit Number 9    Date for Recertification  01/01/24    Progress Note Due on Visit 10    PT Start Time 0842    PT Stop Time 0928    PT Time Calculation (min) 46 min    Activity Tolerance Patient tolerated treatment well    Behavior During Therapy [facility] for tasks assessed/performed                  Past Medical History:  Diagnosis Date   CAD (coronary artery disease) 02/27/2003   a. Previous stents RCA and circ, Cath 2005 patient with 70% PDA  //  b. [place]  5/17 >> LHC oLCx 50, pLAD 50, mLAD 95, RPDA 80, normal LVEF >> s/p CABG   Cancer (HCC)    Carotid artery disease    a. Carotid US  5/17 bilat ICA 1-39%   Cataract    replace 11/2012   Diabetes mellitus    diet controlled   History of detached retina repair 11/06/2006   [facility]   History of echocardiogram    a. EF 55% to 60%. Wall motion was normal, Gr 1 DD, Trivial MR/TR   History of hematuria    History of prostate cancer 03/29/2005   s/p prostatectomy   Hyperlipidemia    Hypertension    Past Surgical History:  Procedure Laterality Date   ANGIOPLASTY     1995,2000,2002   CARDIAC CATHETERIZATION  07/26/2000   Initially the stenosis in the proximal to mid right coronary artery was estimated ar 95%. Following stenting, this improved to 0%. There was residual mild disease in the sostium of the proximal right coronary atrtery. Successful stentng of the proximal to mid right coronary artery with improvement in stent diameter narrowing from 95% to 0% Proc Date  07/29/2000   CARDIAC CATHETERIZATION N/A 07/04/2015   Procedure: Left Heart Cath and Coronary Angiography;  Surgeon: Candyce GORMAN Reek, MD;  Location: [facility] INVASIVE CV LAB;  Service: Cardiovascular;  Laterality: N/A;   CATARACT EXTRACTION W/  INTRAOCULAR LENS IMPLANT Bilateral OS Sept '14; OD Oct '14   Dr. camillo   COLONOSCOPY  04/12/2015   Dr.Gessner   CORONARY ARTERY BYPASS GRAFT N/A 07/08/2015   Procedure: CORONARY ARTERY BYPASS GRAFTING (CABG)x three using left internal mammary artery to left anterior decending artery, left greater saphenous vein grafts to diagonal and posterolateral. Left leg greater saphenous leg vein via endoscope. ;  Surgeon: Dallas KATHEE Jude, MD;  Location: [facility] OR;  Service: Open Heart Surgery;  Laterality: N/A;   CYSTOSCOPY N/A 07/08/2015   Procedure: CYSTOSCOPY FLEXIBLE WITH BALLOON DILATION OF BLADDER NECK CONTRACTURE WITH DIFFICULT CATHETER PLACEMENT;  Surgeon: Gretel Ferrara, MD;  Location: [facility] OR;  Service: Urology;  Laterality: N/A;   EYE SURGERY  11/24/2012   left eye cataract   EYE SURGERY  12/22/2012   right eye cataract   HEMORRHOID SURGERY     I&D of thrombosed hemorrhoid   LIPOMA EXCISION     POLYPECTOMY     PROSTATECTOMY  03/29/2005   PTCA  02/27/2003   TEE WITHOUT CARDIOVERSION N/A 07/08/2015   Procedure: TRANSESOPHAGEAL ECHOCARDIOGRAM (TEE);  Surgeon: Dallas KATHEE Jude, MD;  Location: [facility] OR;  Service: Open Heart Surgery;  Laterality: N/A;   WRIST GANGLION EXCISION  Patient Active Problem List   Diagnosis Date Noted   COVID-19 10/27/2023   Pneumonia due to COVID-19 virus 10/27/2023   Acute hypoxic respiratory failure (HCC) 10/27/2023   Elevated troponin 10/27/2023   Lumbar pain 06/03/2023   Weakness of both legs 06/03/2023   Constipation 12/30/2020   CKD (chronic kidney disease) stage 3, GFR 30-59 ml/min (HCC) 08/01/2015   Carotid artery disease    Hyperlipidemia associated with type 2 diabetes mellitus (HCC) 07/03/2015   Hypertensive heart disease 07/03/2015   Routine health maintenance 12/11/2010   Diabetes mellitus type 2 with complications (HCC) 07/23/2007   Essential hypertension 03/07/2007   Coronary atherosclerosis 03/07/2007   PROSTATE CANCER, HX OF 03/07/2007     PCP: Rollene Norris, MD  REFERRING PROVIDER: Darci Pore, MD  REFERRING DIAG: dyspnea upon exertion  THERAPY DIAG:  Muscle weakness (generalized)  Difficulty in walking, not elsewhere classified  Rationale for Evaluation and Treatment: Rehabilitation  ONSET DATE: 10/26/23  SUBJECTIVE:   SUBJECTIVE STATEMENT:  reports lost his balance when standing up from table at restaurant , didn't fall but lost balance when he turned his head to look at his wife  PERTINENT HISTORY: The pt is an 78 yo male presenting 8/30 with SpO2 89% on RA and SOB, wife also reports pt sliding off couch x2 in last day and unable to get back up. Work up revealed COVID +, Ddimer +, and mild troponin elevation. He was admitted from 10/26/23 to 10/29/23.  Referred to outpt PT to assist with his recovery of function PAIN:  Are you having pain? No  PRECAUTIONS: Fall  RED FLAGS: None   WEIGHT BEARING RESTRICTIONS: No  FALLS:  Has patient fallen in last 6 months? No  LIVING ENVIRONMENT: Lives with: lives with their spouse Lives in: House/apartment Stairs: has interior stairs but doesn't use Has following equipment at home: None  OCCUPATION: retired  PLOF: Independent  PATIENT GOALS: The patient wants to have better stability  NEXT MD VISIT: December 2025  OBJECTIVE:  Note: Objective measures were completed at Evaluation unless otherwise noted.  DIAGNOSTIC FINDINGS: na   COGNITION: Overall cognitive status: Within functional limits for tasks assessed     SENSATION: WFL  EDEMA:  None noted  POSTURE: wide base of support noted in standing   LOWER EXTREMITY MNF:hmnddob wnl   LOWER EXTREMITY MMT:all MMT 4/5 except B plantarflexion 3+/5 R shoulder IR and ER 4/5 with shaking, tremulous movement pattern   FUNCTIONAL TESTS:  5 times sit to stand: 16.06 sec Berg 42/56 Gait speed, 3 M in 6.5 sec:  0.46 m/sec Gait with horizontal head turns, up /down turns, eyes closed, all  wnl.  Gait backwards very slow, unsteady  GAIT: Distance walked: in clnic up to 90 ' Assistive device utilized: None Level of assistance: Modified independence Comments: wide base of support, shortened stance time on L arms mid guard, very slow, decreased to no arm swing  TREATMENT DATE:  12/04/23: Gait outdoors 4 min Standing semi tandem , alt arm swings with 2# wts each hand 3 sec intervals alternating lead foot Hamstring curls B 25# 10 x 3 Knee ext B 10# 10 x 3 Standing semi tandem lead foot on 2 step, with horizontal head turns, 15 x each  Nustep level 5 x 6 min UE's and LE's Lateral travelling on 2' step mass practice Standing for 3 way hip, no wts today 12 reps each leg  Sit to stand with red mediball punches 10 x Standing on airex heel toe rocks, B UE support 10 x     12/02/23:  Nustep L 5 x 6 min UE and Les Standing for lat step overs, transitioning on and off airex Standing with 2# cuff wts B ankles for alt SLR, alt hip abd, alt hip ext In ll bars for walking on airex balance beam 6 laps Sit to stand from hi low mat with forward punches with 2 # mediball 10x Standing tandem 30 sec holds Hamstring curls 25#, 3 x 10  Knee ext B 10# 3 x10 Lat step ups 4 step x 15 reps each side Assessed gait speed , walked 100'    11/29/23:  In ll bars for lunges on compliant side of BOSU no UE  In ll bars for heel/toe rocks on airex In ll bars for side stepping length of bars, 4 laps, with 2# cuff weights B In ll bars for alt SLR 2# cuff wts ankles In ll bars for alt hip ext  Nustep L 5 x 6 min Hamstring curls B 20#, 10 x 2 B knee ext 5# 10 x 2 Gait on airex balance beam in ll bars, used hands for support High marches in ll bars with 5 to 7 sec holds Lateral step ups onto lower step, 4 10 each Alt forward toe taps to 6 step 20 reps  11/25/23:   Bike L 2 x 6 min  In ll bars, lunges on compliant side of BOSU 2 x 15 each leg In ll bars, heel/ toe raises on airex In ll bars traveling side to side on airex In ll bars for 3 way hip 2# cuff wts one set 10 each leg  In ll bars side stepping length of bars 2# cuff weights 5 laps In ll bars for semi tandem standing 30 sec holds  In ll bars for forward heel taps 15 reps each leg form 2 step Knee ext 5# 10 x   11/22/23 Bike L2 x79mins  On airex balance eyes open and closed Marching on airex Reaching for numbers on airex Cone taps on airex Tandem 30s  SLS in bars 5s at best Step ups 6 STS goal- 12s Shoulder ext 5# 2x10 Leg press 20# x10, 30# x10   11/20/23:  Sit to stand with red mediball punches 4 x 5 reps Forefeet on 3 bar for B heel raises 2 x 10 Nustep L5 x 6 min Hamstring curls 25# 10 x 2 B knee ext 5#, 2 x 10 Forward step ups onto 4 step B hand hold 10 x each  Balance training: In ll bars:  Airex static standing Airex with B arm swings Forward lunges on bosu compliant side Standing alt hip abd and alt standing SLR with 2 #cuff wts each leg Walking on airex balance beam,  all of balance training mass practice  11/13/23 NuStep L5x64mins  Leg ext 5# 2x10 HS curls 20# 2x10 Calf raises 2x10 Calf stretch  on bar 20s x2 STS with chest press 2x10 Walking on beam   11/08/23 LAQ 3# 2x10 HS curls green 2x10 Ball squeeze 2x10 NuStep L5x11mins  In bars hip flexion and abd  On airex balance and then eyes closed  Taps 4  STS 2x10   PATIENT EDUCATION:  Education details: POC, goals  Person educated: Patient Education method: Explanation, Demonstration, Tactile cues, Verbal cues, and Handouts Education comprehension: verbalized understanding, returned demonstration, verbal cues required, tactile cues required, and needs further education  HOME EXERCISE PROGRAM: Access Code: [email or web address] URL: [email or web address] Date: 11/08/2023 Prepared by: Almetta Fam  Exercises - Heel Toe Raises with Counter Support  - 2 x daily - 7 x weekly - 2 sets - 10 reps - Standing Hip Abduction with Counter Support  - 2 x daily - 7 x weekly - 2 sets - 10 reps - Standing March with Counter Support  - 2 x daily - 7 x weekly - 2 sets - 10 reps - Sit to Stand  - 2 x daily - 7 x weekly - 2 sets - 10 reps -Tandem and SLS    ASSESSMENT:  CLINICAL IMPRESSION: Patient returned for 9th PT intervention due to compromised endurance, balance, safety after hospitalization with COVID, flu.  Reports improved ease of transfers at this time as compared to initial evaluation. Did have an episode of balance loss when turning his head the other day, immediately after standing up. Today noted decreased stride length when more fatigued at end of walk outdoors at about the 3 min mark.  He had a difficult time with the semi tandem standing with head turns today.  He is going to decrease his frequency to 1 x week starting next week.  Will continue to benefit from ongoing PT to meet his goals.    Patient is a 78 y.o. male who was evaluated today by physical therapy evaluation due to a recent decline in his overall strength, stability, mobility, and hospitalization due to COVID.  Per he and his wife he was declining in stamina and function for 1 -2 months prior to this hospitalization.  He is concerned about losing his balance/ fall risk.  He does demonstrate risk for falls with BERG score of 42/56 and with very slow, guarded gait speed.  Has less stability with isolated weight bearing on L LE vs R.  Vestibular screening wnl.  Should benefit from physical therapy with focused guided progression with endurance, functional strength and balance training.  OBJECTIVE IMPAIRMENTS: decreased activity tolerance, decreased balance, decreased endurance, decreased knowledge of condition, difficulty walking, decreased strength, and impaired perceived functional ability.   ACTIVITY LIMITATIONS:  carrying, bending, standing, squatting, stairs, transfers, and locomotion level  PARTICIPATION LIMITATIONS: meal prep, cleaning, laundry, shopping, and community activity  PERSONAL FACTORS: Age, Behavior pattern, Time since onset of injury/illness/exacerbation, and 1-2 comorbidities: DM are also affecting patient's functional outcome.   REHAB POTENTIAL: Good  CLINICAL DECISION MAKING: Evolving/moderate complexity  EVALUATION COMPLEXITY: Moderate   GOALS: Goals reviewed with patient? Yes  SHORT TERM GOALS: Target date: 2 weeks, 11/20/23 I HEP Baseline: Goal status: MET 11/22/23   LONG TERM GOALS: Target date: 8 weeks, 01/01/24  Berg improve from 42/56  to 45/56 or greater Baseline:  Goal status: INITIAL  2.  Gait speed consistently greater than 0.8 m/sec Baseline:  Goal status: 0.51 m/sec  3.  5 x sit to stand 14.8 sec or less improved from 16.06 sec Baseline:  Goal status: 12s MET 11/22/23  4.  Single leg stance >10s BLE  Baseline:   Goal status: INITIAL   PLAN:  PT FREQUENCY: 2x/week  PT DURATION: 8 weeks  PLANNED INTERVENTIONS: 97110-Therapeutic exercises, 97530- Therapeutic activity, [id number]- Neuromuscular re-education, 97535- Self Care, [id number]- Manual therapy, and [id number]- Gait training  PLAN FOR NEXT SESSION: cardiovascular equipment, bulk strengthening B LE's and Ue's , trunk, balance progression, needs 10th visit progress report next visit   Tira Lafferty L Mehar Kirkwood, PT, DPT,OCS 12/04/2023, 10:15 AM

## 2023-12-12 ENCOUNTER — Ambulatory Visit

## 2023-12-12 DIAGNOSIS — M6281 Muscle weakness (generalized): Secondary | ICD-10-CM

## 2023-12-12 DIAGNOSIS — R262 Difficulty in walking, not elsewhere classified: Secondary | ICD-10-CM

## 2023-12-12 NOTE — Therapy (Signed)
 OUTPATIENT PHYSICAL THERAPY LOWER EXTREMITY PROGRESS REPORT   Patient Name: Clifford [name] MRN: [id number] DOB:1945/03/08, 78 y.o., male Today's Date: 12/12/2023  END OF SESSION:  PT End of Session - 12/12/23 0800     Visit Number 10    Date for Recertification  01/01/24    PT Start Time 0800    PT Stop Time 0842    PT Time Calculation (min) 42 min    Activity Tolerance Patient tolerated treatment well    Behavior During Therapy [facility] for tasks assessed/performed                   Past Medical History:  Diagnosis Date   CAD (coronary artery disease) 02/27/2003   a. Previous stents RCA and circ, Cath 2005 patient with 70% PDA  //  b. [place]  5/17 >> LHC oLCx 50, pLAD 50, mLAD 95, RPDA 80, normal LVEF >> s/p CABG   Cancer (HCC)    Carotid artery disease    a. Carotid US  5/17 bilat ICA 1-39%   Cataract    replace 11/2012   Diabetes mellitus    diet controlled   History of detached retina repair 11/06/2006   [facility]   History of echocardiogram    a. EF 55% to 60%. Wall motion was normal, Gr 1 DD, Trivial MR/TR   History of hematuria    History of prostate cancer 03/29/2005   s/p prostatectomy   Hyperlipidemia    Hypertension    Past Surgical History:  Procedure Laterality Date   ANGIOPLASTY     1995,2000,2002   CARDIAC CATHETERIZATION  07/26/2000   Initially the stenosis in the proximal to mid right coronary artery was estimated ar 95%. Following stenting, this improved to 0%. There was residual mild disease in the sostium of the proximal right coronary atrtery. Successful stentng of the proximal to mid right coronary artery with improvement in stent diameter narrowing from 95% to 0% Proc Date  07/29/2000   CARDIAC CATHETERIZATION N/A 07/04/2015   Procedure: Left Heart Cath and Coronary Angiography;  Surgeon: Candyce GORMAN Reek, MD;  Location: [facility] INVASIVE CV LAB;  Service: Cardiovascular;  Laterality: N/A;   CATARACT EXTRACTION W/ INTRAOCULAR LENS IMPLANT  Bilateral OS Sept '14; OD Oct '14   Dr. camillo   COLONOSCOPY  04/12/2015   Dr.Gessner   CORONARY ARTERY BYPASS GRAFT N/A 07/08/2015   Procedure: CORONARY ARTERY BYPASS GRAFTING (CABG)x three using left internal mammary artery to left anterior decending artery, left greater saphenous vein grafts to diagonal and posterolateral. Left leg greater saphenous leg vein via endoscope. ;  Surgeon: Dallas KATHEE Jude, MD;  Location: [facility] OR;  Service: Open Heart Surgery;  Laterality: N/A;   CYSTOSCOPY N/A 07/08/2015   Procedure: CYSTOSCOPY FLEXIBLE WITH BALLOON DILATION OF BLADDER NECK CONTRACTURE WITH DIFFICULT CATHETER PLACEMENT;  Surgeon: Gretel Ferrara, MD;  Location: [facility] OR;  Service: Urology;  Laterality: N/A;   EYE SURGERY  11/24/2012   left eye cataract   EYE SURGERY  12/22/2012   right eye cataract   HEMORRHOID SURGERY     I&D of thrombosed hemorrhoid   LIPOMA EXCISION     POLYPECTOMY     PROSTATECTOMY  03/29/2005   PTCA  02/27/2003   TEE WITHOUT CARDIOVERSION N/A 07/08/2015   Procedure: TRANSESOPHAGEAL ECHOCARDIOGRAM (TEE);  Surgeon: Dallas KATHEE Jude, MD;  Location: [facility] OR;  Service: Open Heart Surgery;  Laterality: N/A;   WRIST GANGLION EXCISION     Patient Active Problem List   Diagnosis  Date Noted   COVID-19 10/27/2023   Pneumonia due to COVID-19 virus 10/27/2023   Acute hypoxic respiratory failure (HCC) 10/27/2023   Elevated troponin 10/27/2023   Lumbar pain 06/03/2023   Weakness of both legs 06/03/2023   Constipation 12/30/2020   CKD (chronic kidney disease) stage 3, GFR 30-59 ml/min (HCC) 08/01/2015   Carotid artery disease    Hyperlipidemia associated with type 2 diabetes mellitus (HCC) 07/03/2015   Hypertensive heart disease 07/03/2015   Routine health maintenance 12/11/2010   Diabetes mellitus type 2 with complications (HCC) 07/23/2007   Essential hypertension 03/07/2007   Coronary atherosclerosis 03/07/2007   PROSTATE CANCER, HX OF 03/07/2007    PCP: Rollene Norris,  MD  REFERRING PROVIDER: Darci Pore, MD  REFERRING DIAG: dyspnea upon exertion  THERAPY DIAG:  Muscle weakness (generalized)  Difficulty in walking, not elsewhere classified  Rationale for Evaluation and Treatment: Rehabilitation  ONSET DATE: 10/26/23  SUBJECTIVE:   SUBJECTIVE STATEMENT:   Patient reports that after his last appt, he thinks his balance has gotten worse. It seems when I get up and turn, I drift to the side.  PERTINENT HISTORY: The pt is an 78 yo male presenting 8/30 with SpO2 89% on RA and SOB, wife also reports pt sliding off couch x2 in last day and unable to get back up. Work up revealed COVID +, Ddimer +, and mild troponin elevation. He was admitted from 10/26/23 to 10/29/23.  Referred to outpt PT to assist with his recovery of function  PAIN:  Are you having pain? No  PRECAUTIONS: Fall  RED FLAGS: None   WEIGHT BEARING RESTRICTIONS: No  FALLS:  Has patient fallen in last 6 months? No  LIVING ENVIRONMENT: Lives with: lives with their spouse Lives in: House/apartment Stairs: has interior stairs but doesn't use Has following equipment at home: None  OCCUPATION: retired  PLOF: Independent  PATIENT GOALS: The patient wants to have better stability  NEXT MD VISIT: December 2025  OBJECTIVE:  Note: Objective measures were completed at Evaluation unless otherwise noted.  DIAGNOSTIC FINDINGS: na   COGNITION: Overall cognitive status: Within functional limits for tasks assessed     SENSATION: WFL  EDEMA:  None noted  POSTURE: wide base of support noted in standing   LOWER EXTREMITY MNF:hmnddob wnl   LOWER EXTREMITY MMT:all MMT 4/5 except B plantarflexion 3+/5 R shoulder IR and ER 4/5 with shaking, tremulous movement pattern   FUNCTIONAL TESTS:  5 times sit to stand: 16.06 sec Berg 42/56 Gait speed, 3 M in 6.5 sec:  0.46 m/sec Gait with horizontal head turns, up /down turns, eyes closed, all wnl.  Gait backwards very  slow, unsteady  GAIT: Distance walked: in clnic up to 90 ' Assistive device utilized: None Level of assistance: Modified independence Comments: wide base of support, shortened stance time on L arms mid guard, very slow, decreased to no arm swing  TREATMENT DATE:  12/12/23: - Vestibular Screening: Convergence = Endorsed improvements in diplopia when moving closer Smooth pursuits = WFL VOR Cancellation = Normal HIT = R (+); L inconclusive d/t muscle guarding - Nustep Lvl 5 for 6 min UE and LE - BERG: 49/56 - Gait Speed: 3.83 seconds or 1.57 m/s - SLS: RLE = 3 seconds; LLE = 10 seconds VOR x 1 (Horizontal and Vertical)  12/04/23: Gait outdoors 4 min Standing semi tandem , alt arm swings with 2# wts each hand 3 sec intervals alternating lead foot Hamstring curls B 25# 10 x 3 Knee ext B 10# 10 x 3 Standing semi tandem lead foot on 2 step, with horizontal head turns, 15 x each  Nustep level 5 x 6 min UE's and LE's Lateral travelling on 2' step mass practice Standing for 3 way hip, no wts today 12 reps each leg  Sit to stand with red mediball punches 10 x Standing on airex heel toe rocks, B UE support 10 x     12/02/23:  Nustep L 5 x 6 min UE and Les Standing for lat step overs, transitioning on and off airex Standing with 2# cuff wts B ankles for alt SLR, alt hip abd, alt hip ext In ll bars for walking on airex balance beam 6 laps Sit to stand from hi low mat with forward punches with 2 # mediball 10x Standing tandem 30 sec holds Hamstring curls 25#, 3 x 10  Knee ext B 10# 3 x10 Lat step ups 4 step x 15 reps each side Assessed gait speed , walked 100'    11/29/23:  In ll bars for lunges on compliant side of BOSU no UE  In ll bars for heel/toe rocks on airex In ll bars for side stepping length of bars, 4 laps, with 2# cuff weights B In ll bars  for alt SLR 2# cuff wts ankles In ll bars for alt hip ext  Nustep L 5 x 6 min Hamstring curls B 20#, 10 x 2 B knee ext 5# 10 x 2 Gait on airex balance beam in ll bars, used hands for support High marches in ll bars with 5 to 7 sec holds Lateral step ups onto lower step, 4 10 each Alt forward toe taps to 6 step 20 reps  11/25/23:  Bike L 2 x 6 min  In ll bars, lunges on compliant side of BOSU 2 x 15 each leg In ll bars, heel/ toe raises on airex In ll bars traveling side to side on airex In ll bars for 3 way hip 2# cuff wts one set 10 each leg  In ll bars side stepping length of bars 2# cuff weights 5 laps In ll bars for semi tandem standing 30 sec holds  In ll bars for forward heel taps 15 reps each leg form 2 step Knee ext 5# 10 x   11/22/23 Bike L2 x18mins  On airex balance eyes open and closed Marching on airex Reaching for numbers on airex Cone taps on airex Tandem 30s  SLS in bars 5s at best Step ups 6 STS goal- 12s Shoulder ext 5# 2x10 Leg press 20# x10, 30# x10   11/20/23:  Sit to stand with red mediball punches 4 x 5 reps Forefeet on 3 bar for B heel raises 2 x 10 Nustep L5 x 6 min Hamstring curls 25# 10 x 2 B knee ext 5#, 2 x 10 Forward step ups onto 4 step B hand hold  10 x each  Balance training: In ll bars:  Airex static standing Airex with B arm swings Forward lunges on bosu compliant side Standing alt hip abd and alt standing SLR with 2 #cuff wts each leg Walking on airex balance beam,  all of balance training mass practice  11/13/23 NuStep L5x73mins  Leg ext 5# 2x10 HS curls 20# 2x10 Calf raises 2x10 Calf stretch on bar 20s x2 STS with chest press 2x10 Walking on beam   11/08/23 LAQ 3# 2x10 HS curls green 2x10 Ball squeeze 2x10 NuStep L5x51mins  In bars hip flexion and abd  On airex balance and then eyes closed  Taps 4  STS 2x10   PATIENT EDUCATION:  Education details: POC, goals  Person educated: Patient Education method:  Explanation, Demonstration, Tactile cues, Verbal cues, and Handouts Education comprehension: verbalized understanding, returned demonstration, verbal cues required, tactile cues required, and needs further education  HOME EXERCISE PROGRAM: Access Code: [email or web address] URL: [email or web address] Date: 11/08/2023 Prepared by: Almetta Fam  Exercises - Heel Toe Raises with Counter Support  - 2 x daily - 7 x weekly - 2 sets - 10 reps - Standing Hip Abduction with Counter Support  - 2 x daily - 7 x weekly - 2 sets - 10 reps - Standing March with Counter Support  - 2 x daily - 7 x weekly - 2 sets - 10 reps - Sit to Stand  - 2 x daily - 7 x weekly - 2 sets - 10 reps -Tandem and SLS    ASSESSMENT:  CLINICAL IMPRESSION: Patient returned for 10th PT intervention due to compromised endurance, balance, safety after hospitalization with COVID, flu.  Reassessed goals for progress report and found that the pt is making progress, but continues to be limited with dynamic balance RLE functional weakness when compared to the LLE. Upon testing, found to have a positive R HIT, indicating a potential vestibular hypofunction. Provided VOR exercises to perform at home to upregulate his vestibular system.Will continue to benefit from ongoing PT to meet his goals.   Patient is a 78 y.o. male who was evaluated today by physical therapy evaluation due to a recent decline in his overall strength, stability, mobility, and hospitalization due to COVID.  Per he and his wife he was declining in stamina and function for 1 -2 months prior to this hospitalization.  He is concerned about losing his balance/ fall risk.  He does demonstrate risk for falls with BERG score of 42/56 and with very slow, guarded gait speed.  Has less stability with isolated weight bearing on L LE vs R.  Vestibular screening wnl.  Should benefit from physical therapy with focused guided progression with endurance, functional strength and balance  training.  OBJECTIVE IMPAIRMENTS: decreased activity tolerance, decreased balance, decreased endurance, decreased knowledge of condition, difficulty walking, decreased strength, and impaired perceived functional ability.   ACTIVITY LIMITATIONS: carrying, bending, standing, squatting, stairs, transfers, and locomotion level  PARTICIPATION LIMITATIONS: meal prep, cleaning, laundry, shopping, and community activity  PERSONAL FACTORS: Age, Behavior pattern, Time since onset of injury/illness/exacerbation, and 1-2 comorbidities: DM are also affecting patient's functional outcome.   REHAB POTENTIAL: Good  CLINICAL DECISION MAKING: Evolving/moderate complexity  EVALUATION COMPLEXITY: Moderate   GOALS: Goals reviewed with patient? Yes  SHORT TERM GOALS: Target date: 2 weeks, 11/20/23 I HEP Baseline: Goal status: MET 11/22/23   LONG TERM GOALS: Target date: 8 weeks, 01/01/24  Berg improve from 42/56  to 45/56 or greater Baseline:  Goal status: INITIAL; MET  12/12/23  2.  Gait speed consistently greater than 0.8 m/sec Baseline:  Goal status: 0.51 m/sec; MET 12/12/23  3.  5 x sit to stand 14.8 sec or less improved from 16.06 sec Baseline:  Goal status: 12s MET 11/22/23  4. Single leg stance >10s BLE  Baseline:   Goal status: progressing (RLE 3 seconds, LLE 10 seconds) 12/12/23   PLAN:  PT FREQUENCY: 1x/week  PT DURATION: 8 weeks  PLANNED INTERVENTIONS: 97110-Therapeutic exercises, 97530- Therapeutic activity, [id number]- Neuromuscular re-education, 97535- Self Care, [id number]- Manual therapy, and [id number]- Gait training  PLAN FOR NEXT SESSION: cardiovascular equipment, bulk strengthening B LE's and Ue's , trunk, balance progression; vestibular    Deneice Brownie, PT, DPT 12/12/2023, 9:35 AM

## 2023-12-18 ENCOUNTER — Ambulatory Visit

## 2023-12-18 ENCOUNTER — Other Ambulatory Visit: Payer: Self-pay

## 2023-12-18 DIAGNOSIS — R262 Difficulty in walking, not elsewhere classified: Secondary | ICD-10-CM

## 2023-12-18 DIAGNOSIS — M6281 Muscle weakness (generalized): Secondary | ICD-10-CM | POA: Diagnosis not present

## 2023-12-18 NOTE — Therapy (Signed)
 OUTPATIENT PHYSICAL THERAPY LOWER EXTREMITY PROGRESS REPORT   Patient Name: Clifford [name] MRN: [id number] DOB:October 22, 1945, 78 y.o., male Today's Date: 12/18/2023  END OF SESSION:  PT End of Session - 12/18/23 0803     Visit Number 11    Date for Recertification  01/01/24    Progress Note Due on Visit 20    PT Start Time 0800    PT Stop Time 0843    PT Time Calculation (min) 43 min    Activity Tolerance Patient tolerated treatment well    Behavior During Therapy [facility] for tasks assessed/performed                    Past Medical History:  Diagnosis Date   CAD (coronary artery disease) 02/27/2003   a. Previous stents RCA and circ, Cath 2005 patient with 70% PDA  //  b. [place]  5/17 >> LHC oLCx 50, pLAD 50, mLAD 95, RPDA 80, normal LVEF >> s/p CABG   Cancer (HCC)    Carotid artery disease    a. Carotid US  5/17 bilat ICA 1-39%   Cataract    replace 11/2012   Diabetes mellitus    diet controlled   History of detached retina repair 11/06/2006   [facility]   History of echocardiogram    a. EF 55% to 60%. Wall motion was normal, Gr 1 DD, Trivial MR/TR   History of hematuria    History of prostate cancer 03/29/2005   s/p prostatectomy   Hyperlipidemia    Hypertension    Past Surgical History:  Procedure Laterality Date   ANGIOPLASTY     1995,2000,2002   CARDIAC CATHETERIZATION  07/26/2000   Initially the stenosis in the proximal to mid right coronary artery was estimated ar 95%. Following stenting, this improved to 0%. There was residual mild disease in the sostium of the proximal right coronary atrtery. Successful stentng of the proximal to mid right coronary artery with improvement in stent diameter narrowing from 95% to 0% Proc Date  07/29/2000   CARDIAC CATHETERIZATION N/A 07/04/2015   Procedure: Left Heart Cath and Coronary Angiography;  Surgeon: Candyce GORMAN Reek, MD;  Location: [facility] INVASIVE CV LAB;  Service: Cardiovascular;  Laterality: N/A;   CATARACT  EXTRACTION W/ INTRAOCULAR LENS IMPLANT Bilateral OS Sept '14; OD Oct '14   Dr. camillo   COLONOSCOPY  04/12/2015   Dr.Gessner   CORONARY ARTERY BYPASS GRAFT N/A 07/08/2015   Procedure: CORONARY ARTERY BYPASS GRAFTING (CABG)x three using left internal mammary artery to left anterior decending artery, left greater saphenous vein grafts to diagonal and posterolateral. Left leg greater saphenous leg vein via endoscope. ;  Surgeon: Dallas KATHEE Jude, MD;  Location: [facility] OR;  Service: Open Heart Surgery;  Laterality: N/A;   CYSTOSCOPY N/A 07/08/2015   Procedure: CYSTOSCOPY FLEXIBLE WITH BALLOON DILATION OF BLADDER NECK CONTRACTURE WITH DIFFICULT CATHETER PLACEMENT;  Surgeon: Gretel Ferrara, MD;  Location: [facility] OR;  Service: Urology;  Laterality: N/A;   EYE SURGERY  11/24/2012   left eye cataract   EYE SURGERY  12/22/2012   right eye cataract   HEMORRHOID SURGERY     I&D of thrombosed hemorrhoid   LIPOMA EXCISION     POLYPECTOMY     PROSTATECTOMY  03/29/2005   PTCA  02/27/2003   TEE WITHOUT CARDIOVERSION N/A 07/08/2015   Procedure: TRANSESOPHAGEAL ECHOCARDIOGRAM (TEE);  Surgeon: Dallas KATHEE Jude, MD;  Location: [facility] OR;  Service: Open Heart Surgery;  Laterality: N/A;   WRIST GANGLION EXCISION  Patient Active Problem List   Diagnosis Date Noted   COVID-19 10/27/2023   Pneumonia due to COVID-19 virus 10/27/2023   Acute hypoxic respiratory failure (HCC) 10/27/2023   Elevated troponin 10/27/2023   Lumbar pain 06/03/2023   Weakness of both legs 06/03/2023   Constipation 12/30/2020   CKD (chronic kidney disease) stage 3, GFR 30-59 ml/min (HCC) 08/01/2015   Carotid artery disease    Hyperlipidemia associated with type 2 diabetes mellitus (HCC) 07/03/2015   Hypertensive heart disease 07/03/2015   Routine health maintenance 12/11/2010   Diabetes mellitus type 2 with complications (HCC) 07/23/2007   Essential hypertension 03/07/2007   Coronary atherosclerosis 03/07/2007   PROSTATE CANCER, HX OF  03/07/2007    PCP: Rollene Norris, MD  REFERRING PROVIDER: Darci Pore, MD  REFERRING DIAG: dyspnea upon exertion  THERAPY DIAG:  Muscle weakness (generalized)  Difficulty in walking, not elsewhere classified  Rationale for Evaluation and Treatment: Rehabilitation  ONSET DATE: 10/26/23  SUBJECTIVE:   SUBJECTIVE STATEMENT:  Reports that the new VOR exercise is helping a lot  PERTINENT HISTORY: The pt is an 78 yo male presenting 8/30 with SpO2 89% on RA and SOB, wife also reports pt sliding off couch x2 in last day and unable to get back up. Work up revealed COVID +, Ddimer +, and mild troponin elevation. He was admitted from 10/26/23 to 10/29/23.  Referred to outpt PT to assist with his recovery of function  PAIN:  Are you having pain? No  PRECAUTIONS: Fall  RED FLAGS: None   WEIGHT BEARING RESTRICTIONS: No  FALLS:  Has patient fallen in last 6 months? No  LIVING ENVIRONMENT: Lives with: lives with their spouse Lives in: House/apartment Stairs: has interior stairs but doesn't use Has following equipment at home: None  OCCUPATION: retired  PLOF: Independent  PATIENT GOALS: The patient wants to have better stability  NEXT MD VISIT: December 2025  OBJECTIVE:  Note: Objective measures were completed at Evaluation unless otherwise noted.  DIAGNOSTIC FINDINGS: na   COGNITION: Overall cognitive status: Within functional limits for tasks assessed     SENSATION: WFL  EDEMA:  None noted  POSTURE: wide base of support noted in standing   LOWER EXTREMITY MNF:hmnddob wnl   LOWER EXTREMITY MMT:all MMT 4/5 except B plantarflexion 3+/5 R shoulder IR and ER 4/5 with shaking, tremulous movement pattern   FUNCTIONAL TESTS:  5 times sit to stand: 16.06 sec Berg 42/56 Gait speed, 3 M in 6.5 sec:  0.46 m/sec Gait with horizontal head turns, up /down turns, eyes closed, all wnl.  Gait backwards very slow, unsteady  GAIT: Distance walked: in clnic  up to 90 ' Assistive device utilized: None Level of assistance: Modified independence Comments: wide base of support, shortened stance time on L arms mid guard, very slow, decreased to no arm swing  TREATMENT DATE:  12/18/23:  Nustep L 5 x 6 min In ll bars for semi tandem standing with lead foot on 2 step horizontal head turns focused on target  In ll bars for heel toe rocks on airex mass practice In ll bars for alt toe taps to cone positioned on airex Sit to stand from hi low table, feet on airex to challenge balance Marching on airex Knee ext B 10 # 3 x 10  B knee flexion 25# 3 x 10  Seated alt punches with opposite marching, then in standing mass practice Standing with 2 # medibar, narrowed stance shoulder abd arm swings Standing 2 # medibar , narrowed stance B shoulder flexion fast   12/12/23: - Vestibular Screening: Convergence = Endorsed improvements in diplopia when moving closer Smooth pursuits = WFL VOR Cancellation = Normal HIT = R (+); L inconclusive d/t muscle guarding - Nustep Lvl 5 for 6 min UE and LE - BERG: 49/56 - Gait Speed: 3.83 seconds or 1.57 m/s - SLS: RLE = 3 seconds; LLE = 10 seconds VOR x 1 (Horizontal and Vertical)  12/04/23: Gait outdoors 4 min Standing semi tandem , alt arm swings with 2# wts each hand 3 sec intervals alternating lead foot Hamstring curls B 25# 10 x 3 Knee ext B 10# 10 x 3 Standing semi tandem lead foot on 2 step, with horizontal head turns, 15 x each  Nustep level 5 x 6 min UE's and LE's Lateral travelling on 2' step mass practice Standing for 3 way hip, no wts today 12 reps each leg  Sit to stand with red mediball punches 10 x Standing on airex heel toe rocks, B UE support 10 x     12/02/23:  Nustep L 5 x 6 min UE and Les Standing for lat step overs, transitioning on and off airex Standing with  2# cuff wts B ankles for alt SLR, alt hip abd, alt hip ext In ll bars for walking on airex balance beam 6 laps Sit to stand from hi low mat with forward punches with 2 # mediball 10x Standing tandem 30 sec holds Hamstring curls 25#, 3 x 10  Knee ext B 10# 3 x10 Lat step ups 4 step x 15 reps each side Assessed gait speed , walked 100'    11/29/23:  In ll bars for lunges on compliant side of BOSU no UE  In ll bars for heel/toe rocks on airex In ll bars for side stepping length of bars, 4 laps, with 2# cuff weights B In ll bars for alt SLR 2# cuff wts ankles In ll bars for alt hip ext  Nustep L 5 x 6 min Hamstring curls B 20#, 10 x 2 B knee ext 5# 10 x 2 Gait on airex balance beam in ll bars, used hands for support High marches in ll bars with 5 to 7 sec holds Lateral step ups onto lower step, 4 10 each Alt forward toe taps to 6 step 20 reps  11/25/23:  Bike L 2 x 6 min  In ll bars, lunges on compliant side of BOSU 2 x 15 each leg In ll bars, heel/ toe raises on airex In ll bars traveling side to side on airex In ll bars for 3 way hip 2# cuff wts one set 10 each leg  In ll bars side stepping length of bars 2# cuff weights 5 laps In ll bars for semi tandem standing 30 sec holds  In ll bars for  forward heel taps 15 reps each leg form 2 step Knee ext 5# 10 x   11/22/23 Bike L2 x76mins  On airex balance eyes open and closed Marching on airex Reaching for numbers on airex Cone taps on airex Tandem 30s  SLS in bars 5s at best Step ups 6 STS goal- 12s Shoulder ext 5# 2x10 Leg press 20# x10, 30# x10   11/20/23:  Sit to stand with red mediball punches 4 x 5 reps Forefeet on 3 bar for B heel raises 2 x 10 Nustep L5 x 6 min Hamstring curls 25# 10 x 2 B knee ext 5#, 2 x 10 Forward step ups onto 4 step B hand hold 10 x each  Balance training: In ll bars:  Airex static standing Airex with B arm swings Forward lunges on bosu compliant side Standing alt hip abd and alt  standing SLR with 2 #cuff wts each leg Walking on airex balance beam,  all of balance training mass practice  11/13/23 NuStep L5x33mins  Leg ext 5# 2x10 HS curls 20# 2x10 Calf raises 2x10 Calf stretch on bar 20s x2 STS with chest press 2x10 Walking on beam   11/08/23 LAQ 3# 2x10 HS curls green 2x10 Ball squeeze 2x10 NuStep L5x54mins  In bars hip flexion and abd  On airex balance and then eyes closed  Taps 4  STS 2x10   PATIENT EDUCATION:  Education details: POC, goals  Person educated: Patient Education method: Explanation, Demonstration, Tactile cues, Verbal cues, and Handouts Education comprehension: verbalized understanding, returned demonstration, verbal cues required, tactile cues required, and needs further education  HOME EXERCISE PROGRAM: Access Code: [email or web address] URL: [email or web address] Date: 11/08/2023 Prepared by: Almetta Fam  Exercises - Heel Toe Raises with Counter Support  - 2 x daily - 7 x weekly - 2 sets - 10 reps - Standing Hip Abduction with Counter Support  - 2 x daily - 7 x weekly - 2 sets - 10 reps - Standing March with Counter Support  - 2 x daily - 7 x weekly - 2 sets - 10 reps - Sit to Stand  - 2 x daily - 7 x weekly - 2 sets - 10 reps -Tandem and SLS    ASSESSMENT:  CLINICAL IMPRESSION: Patient returned for 11th PT intervention due to compromised endurance, balance, safety after hospitalization with COVID, flu. States improved drift with the new VOR ex.  Added more stability, balance training especially with sit to stand motion he tolerated very well, difficulty as expected with the coordination training .Will continue to benefit from ongoing PT to meet his goals.   Patient is a 78 y.o. male who was evaluated today by physical therapy evaluation due to a recent decline in his overall strength, stability, mobility, and hospitalization due to COVID.  Per he and his wife he was declining in stamina and function for 1 -2 months prior to  this hospitalization.  He is concerned about losing his balance/ fall risk.  He does demonstrate risk for falls with BERG score of 42/56 and with very slow, guarded gait speed.  Has less stability with isolated weight bearing on L LE vs R.  Vestibular screening wnl.  Should benefit from physical therapy with focused guided progression with endurance, functional strength and balance training.  OBJECTIVE IMPAIRMENTS: decreased activity tolerance, decreased balance, decreased endurance, decreased knowledge of condition, difficulty walking, decreased strength, and impaired perceived functional ability.   ACTIVITY LIMITATIONS: carrying, bending, standing, squatting, stairs, transfers, and locomotion level  PARTICIPATION  LIMITATIONS: meal prep, cleaning, laundry, shopping, and community activity  PERSONAL FACTORS: Age, Behavior pattern, Time since onset of injury/illness/exacerbation, and 1-2 comorbidities: DM are also affecting patient's functional outcome.   REHAB POTENTIAL: Good  CLINICAL DECISION MAKING: Evolving/moderate complexity  EVALUATION COMPLEXITY: Moderate   GOALS: Goals reviewed with patient? Yes  SHORT TERM GOALS: Target date: 2 weeks, 11/20/23 I HEP Baseline: Goal status: MET 11/22/23   LONG TERM GOALS: Target date: 8 weeks, 01/01/24  Berg improve from 42/56  to 45/56 or greater Baseline:  Goal status: INITIAL; MET 12/12/23  2.  Gait speed consistently greater than 0.8 m/sec Baseline:  Goal status: 0.51 m/sec; MET 12/12/23  3.  5 x sit to stand 14.8 sec or less improved from 16.06 sec Baseline:  Goal status: 12s MET 11/22/23  4. Single leg stance >10s BLE  Baseline:   Goal status: progressing (RLE 3 seconds, LLE 10 seconds) 12/12/23   PLAN:  PT FREQUENCY: 1x/week  PT DURATION: 8 weeks  PLANNED INTERVENTIONS: 97110-Therapeutic exercises, 97530- Therapeutic activity, 97112- Neuromuscular re-education, 97535- Self Care, [id number]- Manual therapy, and 97116- Gait  training  PLAN FOR NEXT SESSION: cardiovascular equipment, bulk strengthening B LE's and Ue's , trunk, balance progression; vestibular    Carmeline Kowal L Juaquina Machnik, PT, DPT, OCS 12/18/2023, 10:22 AM

## 2023-12-26 ENCOUNTER — Ambulatory Visit

## 2023-12-26 DIAGNOSIS — R262 Difficulty in walking, not elsewhere classified: Secondary | ICD-10-CM

## 2023-12-26 DIAGNOSIS — M6281 Muscle weakness (generalized): Secondary | ICD-10-CM | POA: Diagnosis not present

## 2023-12-26 NOTE — Therapy (Signed)
 OUTPATIENT PHYSICAL THERAPY LOWER EXTREMITY PROGRESS REPORT   Patient Name: Clifford [name] MRN: [id number] DOB:Jul 26, 1945, 78 y.o., male Today's Date: 12/26/2023  END OF SESSION:  PT End of Session - 12/26/23 0754     Visit Number 12    Date for Recertification  01/01/24    PT Start Time 0755    PT Stop Time 0833    PT Time Calculation (min) 38 min    Activity Tolerance Patient tolerated treatment well    Behavior During Therapy [facility] for tasks assessed/performed           Past Medical History:  Diagnosis Date   CAD (coronary artery disease) 02/27/2003   a. Previous stents RCA and circ, Cath 2005 patient with 70% PDA  //  b. [place]  5/17 >> LHC oLCx 50, pLAD 50, mLAD 95, RPDA 80, normal LVEF >> s/p CABG   Cancer (HCC)    Carotid artery disease    a. Carotid US  5/17 bilat ICA 1-39%   Cataract    replace 11/2012   Diabetes mellitus    diet controlled   History of detached retina repair 11/06/2006   [facility]   History of echocardiogram    a. EF 55% to 60%. Wall motion was normal, Gr 1 DD, Trivial MR/TR   History of hematuria    History of prostate cancer 03/29/2005   s/p prostatectomy   Hyperlipidemia    Hypertension    Past Surgical History:  Procedure Laterality Date   ANGIOPLASTY     1995,2000,2002   CARDIAC CATHETERIZATION  07/26/2000   Initially the stenosis in the proximal to mid right coronary artery was estimated ar 95%. Following stenting, this improved to 0%. There was residual mild disease in the sostium of the proximal right coronary atrtery. Successful stentng of the proximal to mid right coronary artery with improvement in stent diameter narrowing from 95% to 0% Proc Date  07/29/2000   CARDIAC CATHETERIZATION N/A 07/04/2015   Procedure: Left Heart Cath and Coronary Angiography;  Surgeon: Candyce GORMAN Reek, MD;  Location: [facility] INVASIVE CV LAB;  Service: Cardiovascular;  Laterality: N/A;   CATARACT EXTRACTION W/ INTRAOCULAR LENS IMPLANT Bilateral OS Sept  '14; OD Oct '14   Dr. camillo   COLONOSCOPY  04/12/2015   Dr.Gessner   CORONARY ARTERY BYPASS GRAFT N/A 07/08/2015   Procedure: CORONARY ARTERY BYPASS GRAFTING (CABG)x three using left internal mammary artery to left anterior decending artery, left greater saphenous vein grafts to diagonal and posterolateral. Left leg greater saphenous leg vein via endoscope. ;  Surgeon: Dallas KATHEE Jude, MD;  Location: [facility] OR;  Service: Open Heart Surgery;  Laterality: N/A;   CYSTOSCOPY N/A 07/08/2015   Procedure: CYSTOSCOPY FLEXIBLE WITH BALLOON DILATION OF BLADDER NECK CONTRACTURE WITH DIFFICULT CATHETER PLACEMENT;  Surgeon: Gretel Ferrara, MD;  Location: [facility] OR;  Service: Urology;  Laterality: N/A;   EYE SURGERY  11/24/2012   left eye cataract   EYE SURGERY  12/22/2012   right eye cataract   HEMORRHOID SURGERY     I&D of thrombosed hemorrhoid   LIPOMA EXCISION     POLYPECTOMY     PROSTATECTOMY  03/29/2005   PTCA  02/27/2003   TEE WITHOUT CARDIOVERSION N/A 07/08/2015   Procedure: TRANSESOPHAGEAL ECHOCARDIOGRAM (TEE);  Surgeon: Dallas KATHEE Jude, MD;  Location: [facility] OR;  Service: Open Heart Surgery;  Laterality: N/A;   WRIST GANGLION EXCISION     Patient Active Problem List   Diagnosis Date Noted   COVID-19 10/27/2023  Pneumonia due to COVID-19 virus 10/27/2023   Acute hypoxic respiratory failure (HCC) 10/27/2023   Elevated troponin 10/27/2023   Lumbar pain 06/03/2023   Weakness of both legs 06/03/2023   Constipation 12/30/2020   CKD (chronic kidney disease) stage 3, GFR 30-59 ml/min (HCC) 08/01/2015   Carotid artery disease    Hyperlipidemia associated with type 2 diabetes mellitus (HCC) 07/03/2015   Hypertensive heart disease 07/03/2015   Routine health maintenance 12/11/2010   Diabetes mellitus type 2 with complications (HCC) 07/23/2007   Essential hypertension 03/07/2007   Coronary atherosclerosis 03/07/2007   PROSTATE CANCER, HX OF 03/07/2007    PCP: Rollene Norris, MD  REFERRING  PROVIDER: Darci Pore, MD  REFERRING DIAG: dyspnea upon exertion  THERAPY DIAG:  Muscle weakness (generalized)  Difficulty in walking, not elsewhere classified  Rationale for Evaluation and Treatment: Rehabilitation  ONSET DATE: 10/26/23  SUBJECTIVE:   SUBJECTIVE STATEMENT:  Reports doing well. No complaints.    PERTINENT HISTORY: The pt is an 78 yo male presenting 8/30 with SpO2 89% on RA and SOB, wife also reports pt sliding off couch x2 in last day and unable to get back up. Work up revealed COVID +, Ddimer +, and mild troponin elevation. He was admitted from 10/26/23 to 10/29/23.  Referred to outpt PT to assist with his recovery of function  PAIN:  Are you having pain? No  PRECAUTIONS: Fall  RED FLAGS: None   WEIGHT BEARING RESTRICTIONS: No  FALLS:  Has patient fallen in last 6 months? No  LIVING ENVIRONMENT: Lives with: lives with their spouse Lives in: House/apartment Stairs: has interior stairs but doesn't use Has following equipment at home: None  OCCUPATION: retired  PLOF: Independent  PATIENT GOALS: The patient wants to have better stability  NEXT MD VISIT: December 2025  OBJECTIVE:  Note: Objective measures were completed at Evaluation unless otherwise noted.  DIAGNOSTIC FINDINGS: na   COGNITION: Overall cognitive status: Within functional limits for tasks assessed     SENSATION: WFL  EDEMA:  None noted  POSTURE: wide base of support noted in standing   LOWER EXTREMITY MNF:hmnddob wnl   LOWER EXTREMITY MMT:all MMT 4/5 except B plantarflexion 3+/5 R shoulder IR and ER 4/5 with shaking, tremulous movement pattern   FUNCTIONAL TESTS:  5 times sit to stand: 16.06 sec Berg 42/56 Gait speed, 3 M in 6.5 sec:  0.46 m/sec Gait with horizontal head turns, up /down turns, eyes closed, all wnl.  Gait backwards very slow, unsteady  GAIT: Distance walked: in clnic up to 90 ' Assistive device utilized: None Level of assistance:  Modified independence Comments: wide base of support, shortened stance time on L arms mid guard, very slow, decreased to no arm swing                                                                                                                                TREATMENT DATE:  12/26/23: Nustep lvl  5 for 6 min Sit>Stand on Airex Pad 2 x 10 Seated cone taps with hands for vestibular upregulation (3 trials) Resisted Standing Marches with 5 second holds in // bars 2 x 8 each side Hip Abduction with resistance 2 x 8  Knee Ext 10# 2 x 10 Leg Curls 25# 2 x 10 Hip Flexor Stretch at Steps  12/18/23:  Nustep L 5 x 6 min In ll bars for semi tandem standing with lead foot on 2 step horizontal head turns focused on target  In ll bars for heel toe rocks on airex mass practice In ll bars for alt toe taps to cone positioned on airex Sit to stand from hi low table, feet on airex to challenge balance Marching on airex Knee ext B 10 # 3 x 10  B knee flexion 25# 3 x 10  Seated alt punches with opposite marching, then in standing mass practice Standing with 2 # medibar, narrowed stance shoulder abd arm swings Standing 2 # medibar , narrowed stance B shoulder flexion fast   12/12/23: - Vestibular Screening: Convergence = Endorsed improvements in diplopia when moving closer Smooth pursuits = WFL VOR Cancellation = Normal HIT = R (+); L inconclusive d/t muscle guarding - Nustep Lvl 5 for 6 min UE and LE - BERG: 49/56 - Gait Speed: 3.83 seconds or 1.57 m/s - SLS: RLE = 3 seconds; LLE = 10 seconds VOR x 1 (Horizontal and Vertical)  12/04/23: Gait outdoors 4 min Standing semi tandem , alt arm swings with 2# wts each hand 3 sec intervals alternating lead foot Hamstring curls B 25# 10 x 3 Knee ext B 10# 10 x 3 Standing semi tandem lead foot on 2 step, with horizontal head turns, 15 x each  Nustep level 5 x 6 min UE's and LE's Lateral travelling on 2' step mass practice Standing for 3 way hip, no  wts today 12 reps each leg  Sit to stand with red mediball punches 10 x Standing on airex heel toe rocks, B UE support 10 x     12/02/23:  Nustep L 5 x 6 min UE and Les Standing for lat step overs, transitioning on and off airex Standing with 2# cuff wts B ankles for alt SLR, alt hip abd, alt hip ext In ll bars for walking on airex balance beam 6 laps Sit to stand from hi low mat with forward punches with 2 # mediball 10x Standing tandem 30 sec holds Hamstring curls 25#, 3 x 10  Knee ext B 10# 3 x10 Lat step ups 4 step x 15 reps each side Assessed gait speed , walked 100'    11/29/23:  In ll bars for lunges on compliant side of BOSU no UE  In ll bars for heel/toe rocks on airex In ll bars for side stepping length of bars, 4 laps, with 2# cuff weights B In ll bars for alt SLR 2# cuff wts ankles In ll bars for alt hip ext  Nustep L 5 x 6 min Hamstring curls B 20#, 10 x 2 B knee ext 5# 10 x 2 Gait on airex balance beam in ll bars, used hands for support High marches in ll bars with 5 to 7 sec holds Lateral step ups onto lower step, 4 10 each Alt forward toe taps to 6 step 20 reps  11/25/23:  Bike L 2 x 6 min  In ll bars, lunges on compliant side of BOSU 2 x 15 each leg In ll  bars, heel/ toe raises on airex In ll bars traveling side to side on airex In ll bars for 3 way hip 2# cuff wts one set 10 each leg  In ll bars side stepping length of bars 2# cuff weights 5 laps In ll bars for semi tandem standing 30 sec holds  In ll bars for forward heel taps 15 reps each leg form 2 step Knee ext 5# 10 x   11/22/23 Bike L2 x99mins  On airex balance eyes open and closed Marching on airex Reaching for numbers on airex Cone taps on airex Tandem 30s  SLS in bars 5s at best Step ups 6 STS goal- 12s Shoulder ext 5# 2x10 Leg press 20# x10, 30# x10   11/20/23:  Sit to stand with red mediball punches 4 x 5 reps Forefeet on 3 bar for B heel raises 2 x 10 Nustep L5 x 6  min Hamstring curls 25# 10 x 2 B knee ext 5#, 2 x 10 Forward step ups onto 4 step B hand hold 10 x each  Balance training: In ll bars:  Airex static standing Airex with B arm swings Forward lunges on bosu compliant side Standing alt hip abd and alt standing SLR with 2 #cuff wts each leg Walking on airex balance beam,  all of balance training mass practice  11/13/23 NuStep L5x73mins  Leg ext 5# 2x10 HS curls 20# 2x10 Calf raises 2x10 Calf stretch on bar 20s x2 STS with chest press 2x10 Walking on beam   11/08/23 LAQ 3# 2x10 HS curls green 2x10 Ball squeeze 2x10 NuStep L5x76mins  In bars hip flexion and abd  On airex balance and then eyes closed  Taps 4  STS 2x10   PATIENT EDUCATION:  Education details: POC, goals  Person educated: Patient Education method: Explanation, Demonstration, Tactile cues, Verbal cues, and Handouts Education comprehension: verbalized understanding, returned demonstration, verbal cues required, tactile cues required, and needs further education  HOME EXERCISE PROGRAM: Access Code: [email or web address] URL: [email or web address] Date: 11/08/2023 Prepared by: Almetta Fam  Exercises - Heel Toe Raises with Counter Support  - 2 x daily - 7 x weekly - 2 sets - 10 reps - Standing Hip Abduction with Counter Support  - 2 x daily - 7 x weekly - 2 sets - 10 reps - Standing March with Counter Support  - 2 x daily - 7 x weekly - 2 sets - 10 reps - Sit to Stand  - 2 x daily - 7 x weekly - 2 sets - 10 reps -Tandem and SLS    ASSESSMENT:  CLINICAL IMPRESSION: Patient returned for 12th PT intervention due to compromised endurance, balance, safety after hospitalization with COVID, flu. Demos slow gait, but improved balance with interventions, requiring SBA-CGA at times. Performed well with the vestibular interventions for upregulating the system. Will continue to benefit from ongoing PT to meet his goals.   Patient is a 78 y.o. male who was evaluated  today by physical therapy evaluation due to a recent decline in his overall strength, stability, mobility, and hospitalization due to COVID.  Per he and his wife he was declining in stamina and function for 1 -2 months prior to this hospitalization.  He is concerned about losing his balance/ fall risk.  He does demonstrate risk for falls with BERG score of 42/56 and with very slow, guarded gait speed.  Has less stability with isolated weight bearing on L LE vs R.  Vestibular screening wnl.  Should benefit from physical  therapy with focused guided progression with endurance, functional strength and balance training.  OBJECTIVE IMPAIRMENTS: decreased activity tolerance, decreased balance, decreased endurance, decreased knowledge of condition, difficulty walking, decreased strength, and impaired perceived functional ability.   ACTIVITY LIMITATIONS: carrying, bending, standing, squatting, stairs, transfers, and locomotion level  PARTICIPATION LIMITATIONS: meal prep, cleaning, laundry, shopping, and community activity  PERSONAL FACTORS: Age, Behavior pattern, Time since onset of injury/illness/exacerbation, and 1-2 comorbidities: DM are also affecting patient's functional outcome.   REHAB POTENTIAL: Good  CLINICAL DECISION MAKING: Evolving/moderate complexity  EVALUATION COMPLEXITY: Moderate   GOALS: Goals reviewed with patient? Yes  SHORT TERM GOALS: Target date: 2 weeks, 11/20/23 I HEP Baseline: Goal status: MET 11/22/23   LONG TERM GOALS: Target date: 8 weeks, 01/01/24  Berg improve from 42/56  to 45/56 or greater Baseline:  Goal status: INITIAL; MET 12/12/23  2.  Gait speed consistently greater than 0.8 m/sec Baseline:  Goal status: 0.51 m/sec; MET 12/12/23  3.  5 x sit to stand 14.8 sec or less improved from 16.06 sec Baseline:  Goal status: 12s MET 11/22/23  4. Single leg stance >10s BLE  Baseline:   Goal status: progressing (RLE 3 seconds, LLE 10 seconds)  12/12/23   PLAN:  PT FREQUENCY: 1x/week  PT DURATION: 8 weeks  PLANNED INTERVENTIONS: 97110-Therapeutic exercises, 97530- Therapeutic activity, [id number]- Neuromuscular re-education, 97535- Self Care, [id number]- Manual therapy, and [id number]- Gait training  PLAN FOR NEXT SESSION: cardiovascular equipment, bulk strengthening B LE's and Ue's , trunk, balance progression; vestibular    Deneice Brownie, PT, DPT 12/26/2023, 8:36 AM

## 2024-01-01 ENCOUNTER — Ambulatory Visit: Attending: Internal Medicine

## 2024-01-01 DIAGNOSIS — M6281 Muscle weakness (generalized): Secondary | ICD-10-CM | POA: Diagnosis present

## 2024-01-01 DIAGNOSIS — R262 Difficulty in walking, not elsewhere classified: Secondary | ICD-10-CM | POA: Insufficient documentation

## 2024-01-01 NOTE — Therapy (Signed)
 OUTPATIENT PHYSICAL THERAPY LOWER EXTREMITY TREATMENT   Patient Name: Clifford [name] MRN: [id number] DOB:July 28, 1945, 78 y.o., male Today's Date: 01/01/2024  END OF SESSION:  PT End of Session - 01/01/24 0848     Visit Number 13    Date for Recertification  01/01/24    PT Start Time 0840    Activity Tolerance Patient tolerated treatment well    Behavior During Therapy [facility] for tasks assessed/performed            Past Medical History:  Diagnosis Date   CAD (coronary artery disease) 02/27/2003   a. Previous stents RCA and circ, Cath 2005 patient with 70% PDA  //  b. [place]  5/17 >> LHC oLCx 50, pLAD 50, mLAD 95, RPDA 80, normal LVEF >> s/p CABG   Cancer (HCC)    Carotid artery disease    a. Carotid US  5/17 bilat ICA 1-39%   Cataract    replace 11/2012   Diabetes mellitus    diet controlled   History of detached retina repair 11/06/2006   [facility]   History of echocardiogram    a. EF 55% to 60%. Wall motion was normal, Gr 1 DD, Trivial MR/TR   History of hematuria    History of prostate cancer 03/29/2005   s/p prostatectomy   Hyperlipidemia    Hypertension    Past Surgical History:  Procedure Laterality Date   ANGIOPLASTY     1995,2000,2002   CARDIAC CATHETERIZATION  07/26/2000   Initially the stenosis in the proximal to mid right coronary artery was estimated ar 95%. Following stenting, this improved to 0%. There was residual mild disease in the sostium of the proximal right coronary atrtery. Successful stentng of the proximal to mid right coronary artery with improvement in stent diameter narrowing from 95% to 0% Proc Date  07/29/2000   CARDIAC CATHETERIZATION N/A 07/04/2015   Procedure: Left Heart Cath and Coronary Angiography;  Surgeon: Candyce GORMAN Reek, MD;  Location: [facility] INVASIVE CV LAB;  Service: Cardiovascular;  Laterality: N/A;   CATARACT EXTRACTION W/ INTRAOCULAR LENS IMPLANT Bilateral OS Sept '14; OD Oct '14   Dr. camillo   COLONOSCOPY  04/12/2015    Dr.Gessner   CORONARY ARTERY BYPASS GRAFT N/A 07/08/2015   Procedure: CORONARY ARTERY BYPASS GRAFTING (CABG)x three using left internal mammary artery to left anterior decending artery, left greater saphenous vein grafts to diagonal and posterolateral. Left leg greater saphenous leg vein via endoscope. ;  Surgeon: Dallas KATHEE Jude, MD;  Location: [facility] OR;  Service: Open Heart Surgery;  Laterality: N/A;   CYSTOSCOPY N/A 07/08/2015   Procedure: CYSTOSCOPY FLEXIBLE WITH BALLOON DILATION OF BLADDER NECK CONTRACTURE WITH DIFFICULT CATHETER PLACEMENT;  Surgeon: Gretel Ferrara, MD;  Location: [facility] OR;  Service: Urology;  Laterality: N/A;   EYE SURGERY  11/24/2012   left eye cataract   EYE SURGERY  12/22/2012   right eye cataract   HEMORRHOID SURGERY     I&D of thrombosed hemorrhoid   LIPOMA EXCISION     POLYPECTOMY     PROSTATECTOMY  03/29/2005   PTCA  02/27/2003   TEE WITHOUT CARDIOVERSION N/A 07/08/2015   Procedure: TRANSESOPHAGEAL ECHOCARDIOGRAM (TEE);  Surgeon: Dallas KATHEE Jude, MD;  Location: [facility] OR;  Service: Open Heart Surgery;  Laterality: N/A;   WRIST GANGLION EXCISION     Patient Active Problem List   Diagnosis Date Noted   COVID-19 10/27/2023   Pneumonia due to COVID-19 virus 10/27/2023   Acute hypoxic respiratory failure (HCC) 10/27/2023  Elevated troponin 10/27/2023   Lumbar pain 06/03/2023   Weakness of both legs 06/03/2023   Constipation 12/30/2020   CKD (chronic kidney disease) stage 3, GFR 30-59 ml/min (HCC) 08/01/2015   Carotid artery disease    Hyperlipidemia associated with type 2 diabetes mellitus (HCC) 07/03/2015   Hypertensive heart disease 07/03/2015   Routine health maintenance 12/11/2010   Diabetes mellitus type 2 with complications (HCC) 07/23/2007   Essential hypertension 03/07/2007   Coronary atherosclerosis 03/07/2007   PROSTATE CANCER, HX OF 03/07/2007    PCP: Rollene Norris, MD  REFERRING PROVIDER: Darci Pore, MD  REFERRING DIAG:  dyspnea upon exertion  THERAPY DIAG:  Muscle weakness (generalized)  Difficulty in walking, not elsewhere classified  Rationale for Evaluation and Treatment: Rehabilitation  ONSET DATE: 10/26/23  SUBJECTIVE:   No pain just some back tightness with prolonged sitting. I would like to work on my balance more.   SUBJECTIVE STATEMENT:  Reports doing well. No complaints.    PERTINENT HISTORY: The pt is an 79 yo male presenting 8/30 with SpO2 89% on RA and SOB, wife also reports pt sliding off couch x2 in last day and unable to get back up. Work up revealed COVID +, Ddimer +, and mild troponin elevation. He was admitted from 10/26/23 to 10/29/23.  Referred to outpt PT to assist with his recovery of function  PAIN:  Are you having pain? No  PRECAUTIONS: Fall  RED FLAGS: None   WEIGHT BEARING RESTRICTIONS: No  FALLS:  Has patient fallen in last 6 months? No  LIVING ENVIRONMENT: Lives with: lives with their spouse Lives in: House/apartment Stairs: has interior stairs but doesn't use Has following equipment at home: None  OCCUPATION: retired  PLOF: Independent  PATIENT GOALS: The patient wants to have better stability  NEXT MD VISIT: December 2025  OBJECTIVE:  Note: Objective measures were completed at Evaluation unless otherwise noted.  DIAGNOSTIC FINDINGS: na   COGNITION: Overall cognitive status: Within functional limits for tasks assessed     SENSATION: WFL  EDEMA:  None noted  POSTURE: wide base of support noted in standing   LOWER EXTREMITY MNF:hmnddob wnl   LOWER EXTREMITY MMT:all MMT 4/5 except B plantarflexion 3+/5 R shoulder IR and ER 4/5 with shaking, tremulous movement pattern   FUNCTIONAL TESTS:  5 times sit to stand: 16.06 sec Berg 42/56 Gait speed, 3 M in 6.5 sec:  0.46 m/sec Gait with horizontal head turns, up /down turns, eyes closed, all wnl.  Gait backwards very slow, unsteady  GAIT: Distance walked: in clnic up to 90 ' Assistive  device utilized: None Level of assistance: Modified independence Comments: wide base of support, shortened stance time on L arms mid guard, very slow, decreased to no arm swing                                                                                                                                TREATMENT DATE:  01/01/24: -Nustep L5x6min -SL Stance >10s BLE -Marches on Airex -Lateral steps on balance beam -Tandem stance on balance beam -Step Ups 2x10 -STS on Airex 2x10   12/26/23: Nustep lvl 5 for 6 min Sit>Stand on Airex Pad 2 x 10 Seated cone taps with hands for vestibular upregulation (3 trials) Resisted Standing Marches with 5 second holds in // bars 2 x 8 each side Hip Abduction with resistance 2 x 8  Knee Ext 10# 2 x 10 Leg Curls 25# 2 x 10 Hip Flexor Stretch at Steps  12/18/23:  Nustep L 5 x 6 min In ll bars for semi tandem standing with lead foot on 2 step horizontal head turns focused on target  In ll bars for heel toe rocks on airex mass practice In ll bars for alt toe taps to cone positioned on airex Sit to stand from hi low table, feet on airex to challenge balance Marching on airex Knee ext B 10 # 3 x 10  B knee flexion 25# 3 x 10  Seated alt punches with opposite marching, then in standing mass practice Standing with 2 # medibar, narrowed stance shoulder abd arm swings Standing 2 # medibar , narrowed stance B shoulder flexion fast   12/12/23: - Vestibular Screening: Convergence = Endorsed improvements in diplopia when moving closer Smooth pursuits = WFL VOR Cancellation = Normal HIT = R (+); L inconclusive d/t muscle guarding - Nustep Lvl 5 for 6 min UE and LE - BERG: 49/56 - Gait Speed: 3.83 seconds or 1.57 m/s - SLS: RLE = 3 seconds; LLE = 10 seconds VOR x 1 (Horizontal and Vertical)  12/04/23: Gait outdoors 4 min Standing semi tandem , alt arm swings with 2# wts each hand 3 sec intervals alternating lead foot Hamstring curls B 25# 10 x  3 Knee ext B 10# 10 x 3 Standing semi tandem lead foot on 2 step, with horizontal head turns, 15 x each  Nustep level 5 x 6 min UE's and LE's Lateral travelling on 2' step mass practice Standing for 3 way hip, no wts today 12 reps each leg  Sit to stand with red mediball punches 10 x Standing on airex heel toe rocks, B UE support 10 x     12/02/23:  Nustep L 5 x 6 min UE and Les Standing for lat step overs, transitioning on and off airex Standing with 2# cuff wts B ankles for alt SLR, alt hip abd, alt hip ext In ll bars for walking on airex balance beam 6 laps Sit to stand from hi low mat with forward punches with 2 # mediball 10x Standing tandem 30 sec holds Hamstring curls 25#, 3 x 10  Knee ext B 10# 3 x10 Lat step ups 4 step x 15 reps each side Assessed gait speed , walked 100'    11/29/23:  In ll bars for lunges on compliant side of BOSU no UE  In ll bars for heel/toe rocks on airex In ll bars for side stepping length of bars, 4 laps, with 2# cuff weights B In ll bars for alt SLR 2# cuff wts ankles In ll bars for alt hip ext  Nustep L 5 x 6 min Hamstring curls B 20#, 10 x 2 B knee ext 5# 10 x 2 Gait on airex balance beam in ll bars, used hands for support High marches in ll bars with 5 to 7 sec holds Lateral step ups onto lower step, 4 10 each Alt forward toe  taps to 6 step 20 reps  11/25/23:  Bike L 2 x 6 min  In ll bars, lunges on compliant side of BOSU 2 x 15 each leg In ll bars, heel/ toe raises on airex In ll bars traveling side to side on airex In ll bars for 3 way hip 2# cuff wts one set 10 each leg  In ll bars side stepping length of bars 2# cuff weights 5 laps In ll bars for semi tandem standing 30 sec holds  In ll bars for forward heel taps 15 reps each leg form 2 step Knee ext 5# 10 x   11/22/23 Bike L2 x71mins  On airex balance eyes open and closed Marching on airex Reaching for numbers on airex Cone taps on airex Tandem 30s  SLS in bars 5s  at best Step ups 6 STS goal- 12s Shoulder ext 5# 2x10 Leg press 20# x10, 30# x10   11/20/23:  Sit to stand with red mediball punches 4 x 5 reps Forefeet on 3 bar for B heel raises 2 x 10 Nustep L5 x 6 min Hamstring curls 25# 10 x 2 B knee ext 5#, 2 x 10 Forward step ups onto 4 step B hand hold 10 x each  Balance training: In ll bars:  Airex static standing Airex with B arm swings Forward lunges on bosu compliant side Standing alt hip abd and alt standing SLR with 2 #cuff wts each leg Walking on airex balance beam,  all of balance training mass practice  11/13/23 NuStep L5x63mins  Leg ext 5# 2x10 HS curls 20# 2x10 Calf raises 2x10 Calf stretch on bar 20s x2 STS with chest press 2x10 Walking on beam   11/08/23 LAQ 3# 2x10 HS curls green 2x10 Ball squeeze 2x10 NuStep L5x21mins  In bars hip flexion and abd  On airex balance and then eyes closed  Taps 4  STS 2x10   PATIENT EDUCATION:  Education details: POC, goals  Person educated: Patient Education method: Explanation, Demonstration, Tactile cues, Verbal cues, and Handouts Education comprehension: verbalized understanding, returned demonstration, verbal cues required, tactile cues required, and needs further education  HOME EXERCISE PROGRAM: Access Code: [email or web address] URL: [email or web address] Date: 11/08/2023 Prepared by: Almetta Fam  Exercises - Heel Toe Raises with Counter Support  - 2 x daily - 7 x weekly - 2 sets - 10 reps - Standing Hip Abduction with Counter Support  - 2 x daily - 7 x weekly - 2 sets - 10 reps - Standing March with Counter Support  - 2 x daily - 7 x weekly - 2 sets - 10 reps - Sit to Stand  - 2 x daily - 7 x weekly - 2 sets - 10 reps -Tandem and SLS    ASSESSMENT:  CLINICAL IMPRESSION: Patient returned for PT intervention due to compromised endurance, balance. Pt presents with balance deficits and requires assistance with SL activities. He would like to continue with PT to  work on balance and endurance. Added in new for test. Pt will benefit from continued PT to meet remaining goals and improve gait speed and mobility.   Patient is a 78 y.o. male who was evaluated today by physical therapy evaluation due to a recent decline in his overall strength, stability, mobility, and hospitalization due to COVID.  Per he and his wife he was declining in stamina and function for 1 -2 months prior to this hospitalization.  He is concerned about losing his balance/ fall risk.  He does  demonstrate risk for falls with BERG score of 42/56 and with very slow, guarded gait speed.  Has less stability with isolated weight bearing on L LE vs R.  Vestibular screening wnl.  Should benefit from physical therapy with focused guided progression with endurance, functional strength and balance training.  OBJECTIVE IMPAIRMENTS: decreased activity tolerance, decreased balance, decreased endurance, decreased knowledge of condition, difficulty walking, decreased strength, and impaired perceived functional ability.   ACTIVITY LIMITATIONS: carrying, bending, standing, squatting, stairs, transfers, and locomotion level  PARTICIPATION LIMITATIONS: meal prep, cleaning, laundry, shopping, and community activity  PERSONAL FACTORS: Age, Behavior pattern, Time since onset of injury/illness/exacerbation, and 1-2 comorbidities: DM are also affecting patient's functional outcome.   REHAB POTENTIAL: Good  CLINICAL DECISION MAKING: Evolving/moderate complexity  EVALUATION COMPLEXITY: Moderate   GOALS: Goals reviewed with patient? Yes  SHORT TERM GOALS: Target date: 2 weeks, 11/20/23 I HEP Baseline: Goal status: MET 11/22/23   LONG TERM GOALS: Target date: 8 weeks, 01/01/24  Berg improve from 42/56  to 45/56 or greater Baseline:  Goal status: INITIAL; MET 12/12/23  2.  Gait speed consistently greater than 0.8 m/sec Baseline:  Goal status: 0.51 m/sec; MET 12/12/23  3.  5 x sit to stand 14.8  sec or less improved from 16.06 sec Baseline:  Goal status: 12s MET 11/22/23  4. Single leg stance >10s BLE  Baseline:   Goal status: progressing (RLE 3 seconds, LLE 10 seconds) 12/12/23; 3s BLE 01/01/24  5. improve to 800' or better  Baseline: 600;  Goal Status: Initial    PLAN:  PT FREQUENCY: 1x/week  PT DURATION: 8 weeks  PLANNED INTERVENTIONS: 97110-Therapeutic exercises, 97530- Therapeutic activity, [id number]- Neuromuscular re-education, 97535- Self Care, [id number]- Manual therapy, and [id number]- Gait training  PLAN FOR NEXT SESSION: cardiovascular equipment, bulk strengthening B LE's and Ue's , trunk, balance progression; vestibular    Thersia Alder, Student-PT, DPT 01/01/2024, 9:26 AM

## 2024-01-08 ENCOUNTER — Other Ambulatory Visit: Payer: Self-pay

## 2024-01-08 ENCOUNTER — Ambulatory Visit

## 2024-01-08 DIAGNOSIS — R262 Difficulty in walking, not elsewhere classified: Secondary | ICD-10-CM

## 2024-01-08 DIAGNOSIS — M6281 Muscle weakness (generalized): Secondary | ICD-10-CM | POA: Diagnosis not present

## 2024-01-08 NOTE — Therapy (Signed)
 OUTPATIENT PHYSICAL THERAPY LOWER EXTREMITY TREATMENT   Patient Name: Clifford [name] MRN: [id number] DOB:01-Dec-1945, 78 y.o., male Today's Date: 01/08/2024  END OF SESSION:  PT End of Session - 01/08/24 0853     Visit Number 14    Date for Recertification  02/12/24    Progress Note Due on Visit 20    PT Start Time 0845    PT Stop Time 0928    PT Time Calculation (min) 43 min    Activity Tolerance Patient tolerated treatment well    Behavior During Therapy [facility] for tasks assessed/performed             Past Medical History:  Diagnosis Date   CAD (coronary artery disease) 02/27/2003   a. Previous stents RCA and circ, Cath 2005 patient with 70% PDA  //  b. [place]  5/17 >> LHC oLCx 50, pLAD 50, mLAD 95, RPDA 80, normal LVEF >> s/p CABG   Cancer (HCC)    Carotid artery disease    a. Carotid US  5/17 bilat ICA 1-39%   Cataract    replace 11/2012   Diabetes mellitus    diet controlled   History of detached retina repair 11/06/2006   [facility]   History of echocardiogram    a. EF 55% to 60%. Wall motion was normal, Gr 1 DD, Trivial MR/TR   History of hematuria    History of prostate cancer 03/29/2005   s/p prostatectomy   Hyperlipidemia    Hypertension    Past Surgical History:  Procedure Laterality Date   ANGIOPLASTY     1995,2000,2002   CARDIAC CATHETERIZATION  07/26/2000   Initially the stenosis in the proximal to mid right coronary artery was estimated ar 95%. Following stenting, this improved to 0%. There was residual mild disease in the sostium of the proximal right coronary atrtery. Successful stentng of the proximal to mid right coronary artery with improvement in stent diameter narrowing from 95% to 0% Proc Date  07/29/2000   CARDIAC CATHETERIZATION N/A 07/04/2015   Procedure: Left Heart Cath and Coronary Angiography;  Surgeon: Candyce GORMAN Reek, MD;  Location: [facility] INVASIVE CV LAB;  Service: Cardiovascular;  Laterality: N/A;   CATARACT EXTRACTION W/ INTRAOCULAR  LENS IMPLANT Bilateral OS Sept '14; OD Oct '14   Dr. camillo   COLONOSCOPY  04/12/2015   Dr.Gessner   CORONARY ARTERY BYPASS GRAFT N/A 07/08/2015   Procedure: CORONARY ARTERY BYPASS GRAFTING (CABG)x three using left internal mammary artery to left anterior decending artery, left greater saphenous vein grafts to diagonal and posterolateral. Left leg greater saphenous leg vein via endoscope. ;  Surgeon: Dallas KATHEE Jude, MD;  Location: [facility] OR;  Service: Open Heart Surgery;  Laterality: N/A;   CYSTOSCOPY N/A 07/08/2015   Procedure: CYSTOSCOPY FLEXIBLE WITH BALLOON DILATION OF BLADDER NECK CONTRACTURE WITH DIFFICULT CATHETER PLACEMENT;  Surgeon: Gretel Ferrara, MD;  Location: [facility] OR;  Service: Urology;  Laterality: N/A;   EYE SURGERY  11/24/2012   left eye cataract   EYE SURGERY  12/22/2012   right eye cataract   HEMORRHOID SURGERY     I&D of thrombosed hemorrhoid   LIPOMA EXCISION     POLYPECTOMY     PROSTATECTOMY  03/29/2005   PTCA  02/27/2003   TEE WITHOUT CARDIOVERSION N/A 07/08/2015   Procedure: TRANSESOPHAGEAL ECHOCARDIOGRAM (TEE);  Surgeon: Dallas KATHEE Jude, MD;  Location: [facility] OR;  Service: Open Heart Surgery;  Laterality: N/A;   WRIST GANGLION EXCISION     Patient Active Problem List  Diagnosis Date Noted   COVID-19 10/27/2023   Pneumonia due to COVID-19 virus 10/27/2023   Acute hypoxic respiratory failure (HCC) 10/27/2023   Elevated troponin 10/27/2023   Lumbar pain 06/03/2023   Weakness of both legs 06/03/2023   Constipation 12/30/2020   CKD (chronic kidney disease) stage 3, GFR 30-59 ml/min (HCC) 08/01/2015   Carotid artery disease    Hyperlipidemia associated with type 2 diabetes mellitus (HCC) 07/03/2015   Hypertensive heart disease 07/03/2015   Routine health maintenance 12/11/2010   Diabetes mellitus type 2 with complications (HCC) 07/23/2007   Essential hypertension 03/07/2007   Coronary atherosclerosis 03/07/2007   PROSTATE CANCER, HX OF 03/07/2007    PCP:  Rollene Norris, MD  REFERRING PROVIDER: Darci Pore, MD  REFERRING DIAG: dyspnea upon exertion  THERAPY DIAG:  Muscle weakness (generalized)  Difficulty in walking, not elsewhere classified  Rationale for Evaluation and Treatment: Rehabilitation  ONSET DATE: 10/26/23  SUBJECTIVE:    SUBJECTIVE STATEMENT:  Lower back is sore, and I am stiff all over , still want to work on balance    PERTINENT HISTORY: The pt is an 78 yo male presenting 8/30 with SpO2 89% on RA and SOB, wife also reports pt sliding off couch x2 in last day and unable to get back up. Work up revealed COVID +, Ddimer +, and mild troponin elevation. He was admitted from 10/26/23 to 10/29/23.  Referred to outpt PT to assist with his recovery of function  PAIN:  Are you having pain? No  PRECAUTIONS: Fall  RED FLAGS: None   WEIGHT BEARING RESTRICTIONS: No  FALLS:  Has patient fallen in last 6 months? No  LIVING ENVIRONMENT: Lives with: lives with their spouse Lives in: House/apartment Stairs: has interior stairs but doesn't use Has following equipment at home: None  OCCUPATION: retired  PLOF: Independent  PATIENT GOALS: The patient wants to have better stability  NEXT MD VISIT: December 2025  OBJECTIVE:  Note: Objective measures were completed at Evaluation unless otherwise noted.  DIAGNOSTIC FINDINGS: na   COGNITION: Overall cognitive status: Within functional limits for tasks assessed     SENSATION: WFL  EDEMA:  None noted  POSTURE: wide base of support noted in standing   LOWER EXTREMITY MNF:hmnddob wnl   LOWER EXTREMITY MMT:all MMT 4/5 except B plantarflexion 3+/5 R shoulder IR and ER 4/5 with shaking, tremulous movement pattern   FUNCTIONAL TESTS:  5 times sit to stand: 16.06 sec Berg 42/56 Gait speed, 3 M in 6.5 sec:  0.46 m/sec Gait with horizontal head turns, up /down turns, eyes closed, all wnl.  Gait backwards very slow, unsteady  GAIT: Distance walked:  in clnic up to 90 ' Assistive device utilized: None Level of assistance: Modified independence Comments: wide base of support, shortened stance time on L arms mid guard, very slow, decreased to no arm swing  TREATMENT DATE:  01/08/24: Nustep  LL bars marching on airex Tandem standing 30 sec alt lead foot 3 way hip alt feet 10 x each 1 1/2 # Forward step ups with 1 1/2 # each leg onto 6 step  Standing feet together  In ll bars for alt toe taps to sea urchins B plantarflexor stretch on incline with Heel raises, 20 x Single leg stance 10sec each leg Knee flexion B 25# 2 x 10 Knee ext 10# 2 x 10 Sit to stand form hi lo mat feet on airex Side stepping over airex balance beam in ll bars mass practice   01/01/24: -Nustep L5x21min -SL Stance >10s BLE -Marches on Airex -Lateral steps on balance beam -Tandem stance on balance beam -Step Ups 2x10 -STS on Airex 2x10   12/26/23: Nustep lvl 5 for 6 min Sit>Stand on Airex Pad 2 x 10 Seated cone taps with hands for vestibular upregulation (3 trials) Resisted Standing Marches with 5 second holds in // bars 2 x 8 each side Hip Abduction with resistance 2 x 8  Knee Ext 10# 2 x 10 Leg Curls 25# 2 x 10 Hip Flexor Stretch at Steps  12/18/23:  Nustep L 5 x 6 min In ll bars for semi tandem standing with lead foot on 2 step horizontal head turns focused on target  In ll bars for heel toe rocks on airex mass practice In ll bars for alt toe taps to cone positioned on airex Sit to stand from hi low table, feet on airex to challenge balance Marching on airex Knee ext B 10 # 3 x 10  B knee flexion 25# 3 x 10  Seated alt punches with opposite marching, then in standing mass practice Standing with 2 # medibar, narrowed stance shoulder abd arm swings Standing 2 # medibar , narrowed stance B shoulder flexion fast    12/12/23: - Vestibular Screening: Convergence = Endorsed improvements in diplopia when moving closer Smooth pursuits = WFL VOR Cancellation = Normal HIT = R (+); L inconclusive d/t muscle guarding - Nustep Lvl 5 for 6 min UE and LE - BERG: 49/56 - Gait Speed: 3.83 seconds or 1.57 m/s - SLS: RLE = 3 seconds; LLE = 10 seconds VOR x 1 (Horizontal and Vertical)  12/04/23: Gait outdoors 4 min Standing semi tandem , alt arm swings with 2# wts each hand 3 sec intervals alternating lead foot Hamstring curls B 25# 10 x 3 Knee ext B 10# 10 x 3 Standing semi tandem lead foot on 2 step, with horizontal head turns, 15 x each  Nustep level 5 x 6 min UE's and LE's Lateral travelling on 2' step mass practice Standing for 3 way hip, no wts today 12 reps each leg  Sit to stand with red mediball punches 10 x Standing on airex heel toe rocks, B UE support 10 x     12/02/23:  Nustep L 5 x 6 min UE and Les Standing for lat step overs, transitioning on and off airex Standing with 2# cuff wts B ankles for alt SLR, alt hip abd, alt hip ext In ll bars for walking on airex balance beam 6 laps Sit to stand from hi low mat with forward punches with 2 # mediball 10x Standing tandem 30 sec holds Hamstring curls 25#, 3 x 10  Knee ext B 10# 3 x10 Lat step ups 4 step x 15 reps each side Assessed gait speed , walked 100'    11/29/23:  In ll  bars for lunges on compliant side of BOSU no UE  In ll bars for heel/toe rocks on airex In ll bars for side stepping length of bars, 4 laps, with 2# cuff weights B In ll bars for alt SLR 2# cuff wts ankles In ll bars for alt hip ext  Nustep L 5 x 6 min Hamstring curls B 20#, 10 x 2 B knee ext 5# 10 x 2 Gait on airex balance beam in ll bars, used hands for support High marches in ll bars with 5 to 7 sec holds Lateral step ups onto lower step, 4 10 each Alt forward toe taps to 6 step 20 reps  11/25/23:  Bike L 2 x 6 min  In ll bars, lunges on compliant  side of BOSU 2 x 15 each leg In ll bars, heel/ toe raises on airex In ll bars traveling side to side on airex In ll bars for 3 way hip 2# cuff wts one set 10 each leg  In ll bars side stepping length of bars 2# cuff weights 5 laps In ll bars for semi tandem standing 30 sec holds  In ll bars for forward heel taps 15 reps each leg form 2 step Knee ext 5# 10 x   11/22/23 Bike L2 x64mins  On airex balance eyes open and closed Marching on airex Reaching for numbers on airex Cone taps on airex Tandem 30s  SLS in bars 5s at best Step ups 6 STS goal- 12s Shoulder ext 5# 2x10 Leg press 20# x10, 30# x10   11/20/23:  Sit to stand with red mediball punches 4 x 5 reps Forefeet on 3 bar for B heel raises 2 x 10 Nustep L5 x 6 min Hamstring curls 25# 10 x 2 B knee ext 5#, 2 x 10 Forward step ups onto 4 step B hand hold 10 x each  Balance training: In ll bars:  Airex static standing Airex with B arm swings Forward lunges on bosu compliant side Standing alt hip abd and alt standing SLR with 2 #cuff wts each leg Walking on airex balance beam,  all of balance training mass practice  11/13/23 NuStep L5x30mins  Leg ext 5# 2x10 HS curls 20# 2x10 Calf raises 2x10 Calf stretch on bar 20s x2 STS with chest press 2x10 Walking on beam   11/08/23 LAQ 3# 2x10 HS curls green 2x10 Ball squeeze 2x10 NuStep L5x79mins  In bars hip flexion and abd  On airex balance and then eyes closed  Taps 4  STS 2x10   PATIENT EDUCATION:  Education details: POC, goals  Person educated: Patient Education method: Explanation, Demonstration, Tactile cues, Verbal cues, and Handouts Education comprehension: verbalized understanding, returned demonstration, verbal cues required, tactile cues required, and needs further education  HOME EXERCISE PROGRAM: Access Code: [email or web address] URL: [email or web address] Date: 11/08/2023 Prepared by: Almetta Fam  Exercises - Heel Toe Raises with Counter  Support  - 2 x daily - 7 x weekly - 2 sets - 10 reps - Standing Hip Abduction with Counter Support  - 2 x daily - 7 x weekly - 2 sets - 10 reps - Standing March with Counter Support  - 2 x daily - 7 x weekly - 2 sets - 10 reps - Sit to Stand  - 2 x daily - 7 x weekly - 2 sets - 10 reps -Tandem and SLS    ASSESSMENT:  CLINICAL IMPRESSION: Patient returned for PT intervention due to compromised endurance, balance. Pt presents with balance  deficits primarily with initial standing.  Noted improved gait speed today.   He would like to continue with PT to work on balance and endurance. Tolerated today's session well. Pt will benefit from continued PT to meet remaining goals and improve gait speed and mobility.   Patient is a 78 y.o. male who was evaluated today by physical therapy evaluation due to a recent decline in his overall strength, stability, mobility, and hospitalization due to COVID.  Per he and his wife he was declining in stamina and function for 1 -2 months prior to this hospitalization.  He is concerned about losing his balance/ fall risk.  He does demonstrate risk for falls with BERG score of 42/56 and with very slow, guarded gait speed.  Has less stability with isolated weight bearing on L LE vs R.  Vestibular screening wnl.  Should benefit from physical therapy with focused guided progression with endurance, functional strength and balance training.  OBJECTIVE IMPAIRMENTS: decreased activity tolerance, decreased balance, decreased endurance, decreased knowledge of condition, difficulty walking, decreased strength, and impaired perceived functional ability.   ACTIVITY LIMITATIONS: carrying, bending, standing, squatting, stairs, transfers, and locomotion level  PARTICIPATION LIMITATIONS: meal prep, cleaning, laundry, shopping, and community activity  PERSONAL FACTORS: Age, Behavior pattern, Time since onset of injury/illness/exacerbation, and 1-2 comorbidities: DM are also affecting  patient's functional outcome.   REHAB POTENTIAL: Good  CLINICAL DECISION MAKING: Evolving/moderate complexity  EVALUATION COMPLEXITY: Moderate   GOALS: Goals reviewed with patient? Yes  SHORT TERM GOALS: Target date: 2 weeks, 11/20/23 I HEP Baseline: Goal status: MET 11/22/23   LONG TERM GOALS: Target date: 8 weeks, 01/01/24, extended to 02/12/24  Berg improve from 42/56  to 45/56 or greater Baseline:  Goal status: INITIAL; MET 12/12/23  2.  Gait speed consistently greater than 0.8 m/sec Baseline:  Goal status: 0.51 m/sec; MET 12/12/23  3.  5 x sit to stand 14.8 sec or less improved from 16.06 sec Baseline:  Goal status: 12s MET 11/22/23  4. Single leg stance >10s BLE  Baseline:   Goal status: progressing (RLE 3 seconds, LLE 10 seconds) 12/12/23; 3s BLE 01/01/24  5. improve to 800' or better  Baseline: 600;  Goal Status: Initial    PLAN:  PT FREQUENCY: 1x/week  PT DURATION: 8 weeks  PLANNED INTERVENTIONS: 97110-Therapeutic exercises, 97530- Therapeutic activity, 97112- Neuromuscular re-education, 97535- Self Care, [id number]- Manual therapy, and [id number]- Gait training  PLAN FOR NEXT SESSION: cardiovascular equipment, bulk strengthening B LE's and Ue's , trunk, balance progression; vestibular    Myalee Stengel L Lannie Yusuf, PT, DPT, OCS 01/08/2024, 9:30 AM

## 2024-01-15 ENCOUNTER — Other Ambulatory Visit: Payer: Self-pay

## 2024-01-15 ENCOUNTER — Ambulatory Visit

## 2024-01-15 DIAGNOSIS — M6281 Muscle weakness (generalized): Secondary | ICD-10-CM | POA: Diagnosis not present

## 2024-01-15 DIAGNOSIS — R262 Difficulty in walking, not elsewhere classified: Secondary | ICD-10-CM

## 2024-01-15 NOTE — Therapy (Signed)
 OUTPATIENT PHYSICAL THERAPY LOWER EXTREMITY TREATMENT   Patient Name: Clifford [name] MRN: [id number] DOB:1946/02/05, 78 y.o., male Today's Date: 01/15/2024  END OF SESSION:  PT End of Session - 01/15/24 0754     Visit Number 15    Date for Recertification  02/12/24    Progress Note Due on Visit 20    PT Start Time 0758    PT Stop Time 0842    PT Time Calculation (min) 44 min    Activity Tolerance Patient tolerated treatment well    Behavior During Therapy [facility] for tasks assessed/performed              Past Medical History:  Diagnosis Date   CAD (coronary artery disease) 02/27/2003   a. Previous stents RCA and circ, Cath 2005 patient with 70% PDA  //  b. [place]  5/17 >> LHC oLCx 50, pLAD 50, mLAD 95, RPDA 80, normal LVEF >> s/p CABG   Cancer (HCC)    Carotid artery disease    a. Carotid US  5/17 bilat ICA 1-39%   Cataract    replace 11/2012   Diabetes mellitus    diet controlled   History of detached retina repair 11/06/2006   [facility]   History of echocardiogram    a. EF 55% to 60%. Wall motion was normal, Gr 1 DD, Trivial MR/TR   History of hematuria    History of prostate cancer 03/29/2005   s/p prostatectomy   Hyperlipidemia    Hypertension    Past Surgical History:  Procedure Laterality Date   ANGIOPLASTY     1995,2000,2002   CARDIAC CATHETERIZATION  07/26/2000   Initially the stenosis in the proximal to mid right coronary artery was estimated ar 95%. Following stenting, this improved to 0%. There was residual mild disease in the sostium of the proximal right coronary atrtery. Successful stentng of the proximal to mid right coronary artery with improvement in stent diameter narrowing from 95% to 0% Proc Date  07/29/2000   CARDIAC CATHETERIZATION N/A 07/04/2015   Procedure: Left Heart Cath and Coronary Angiography;  Surgeon: Candyce GORMAN Reek, MD;  Location: [facility] INVASIVE CV LAB;  Service: Cardiovascular;  Laterality: N/A;   CATARACT EXTRACTION W/  INTRAOCULAR LENS IMPLANT Bilateral OS Sept '14; OD Oct '14   Dr. camillo   COLONOSCOPY  04/12/2015   Dr.Gessner   CORONARY ARTERY BYPASS GRAFT N/A 07/08/2015   Procedure: CORONARY ARTERY BYPASS GRAFTING (CABG)x three using left internal mammary artery to left anterior decending artery, left greater saphenous vein grafts to diagonal and posterolateral. Left leg greater saphenous leg vein via endoscope. ;  Surgeon: Dallas KATHEE Jude, MD;  Location: [facility] OR;  Service: Open Heart Surgery;  Laterality: N/A;   CYSTOSCOPY N/A 07/08/2015   Procedure: CYSTOSCOPY FLEXIBLE WITH BALLOON DILATION OF BLADDER NECK CONTRACTURE WITH DIFFICULT CATHETER PLACEMENT;  Surgeon: Gretel Ferrara, MD;  Location: [facility] OR;  Service: Urology;  Laterality: N/A;   EYE SURGERY  11/24/2012   left eye cataract   EYE SURGERY  12/22/2012   right eye cataract   HEMORRHOID SURGERY     I&D of thrombosed hemorrhoid   LIPOMA EXCISION     POLYPECTOMY     PROSTATECTOMY  03/29/2005   PTCA  02/27/2003   TEE WITHOUT CARDIOVERSION N/A 07/08/2015   Procedure: TRANSESOPHAGEAL ECHOCARDIOGRAM (TEE);  Surgeon: Dallas KATHEE Jude, MD;  Location: [facility] OR;  Service: Open Heart Surgery;  Laterality: N/A;   WRIST GANGLION EXCISION     Patient Active Problem List  Diagnosis Date Noted   COVID-19 10/27/2023   Pneumonia due to COVID-19 virus 10/27/2023   Acute hypoxic respiratory failure (HCC) 10/27/2023   Elevated troponin 10/27/2023   Lumbar pain 06/03/2023   Weakness of both legs 06/03/2023   Constipation 12/30/2020   CKD (chronic kidney disease) stage 3, GFR 30-59 ml/min (HCC) 08/01/2015   Carotid artery disease    Hyperlipidemia associated with type 2 diabetes mellitus (HCC) 07/03/2015   Hypertensive heart disease 07/03/2015   Routine health maintenance 12/11/2010   Diabetes mellitus type 2 with complications (HCC) 07/23/2007   Essential hypertension 03/07/2007   Coronary atherosclerosis 03/07/2007   PROSTATE CANCER, HX OF 03/07/2007     PCP: Rollene Norris, MD  REFERRING PROVIDER: Darci Pore, MD  REFERRING DIAG: dyspnea upon exertion  THERAPY DIAG:  Muscle weakness (generalized)  Difficulty in walking, not elsewhere classified  Rationale for Evaluation and Treatment: Rehabilitation  ONSET DATE: 10/26/23  SUBJECTIVE:    SUBJECTIVE STATEMENT:  have been going to grocery store, antique stores, sometimes back sore with prolonged walking, gets better when I sit down.   PERTINENT HISTORY: The pt is an 78 yo male presenting 8/30 with SpO2 89% on RA and SOB, wife also reports pt sliding off couch x2 in last day and unable to get back up. Work up revealed COVID +, Ddimer +, and mild troponin elevation. He was admitted from 10/26/23 to 10/29/23.  Referred to outpt PT to assist with his recovery of function  PAIN:  Are you having pain? No  PRECAUTIONS: Fall  RED FLAGS: None   WEIGHT BEARING RESTRICTIONS: No  FALLS:  Has patient fallen in last 6 months? No  LIVING ENVIRONMENT: Lives with: lives with their spouse Lives in: House/apartment Stairs: has interior stairs but doesn't use Has following equipment at home: None  OCCUPATION: retired  PLOF: Independent  PATIENT GOALS: The patient wants to have better stability  NEXT MD VISIT: December 2025  OBJECTIVE:  Note: Objective measures were completed at Evaluation unless otherwise noted.  DIAGNOSTIC FINDINGS: na   COGNITION: Overall cognitive status: Within functional limits for tasks assessed     SENSATION: WFL  EDEMA:  None noted  POSTURE: wide base of support noted in standing   LOWER EXTREMITY MNF:hmnddob wnl   LOWER EXTREMITY MMT:all MMT 4/5 except B plantarflexion 3+/5 R shoulder IR and ER 4/5 with shaking, tremulous movement pattern   FUNCTIONAL TESTS:  5 times sit to stand: 16.06 sec Berg 42/56 Gait speed, 3 M in 6.5 sec:  0.46 m/sec Gait with horizontal head turns, up /down turns, eyes closed, all wnl.  Gait  backwards very slow, unsteady  GAIT: Distance walked: in clnic up to 90 ' Assistive device utilized: None Level of assistance: Modified independence Comments: wide base of support, shortened stance time on L arms mid guard, very slow, decreased to no arm swing  TREATMENT DATE:  01/15/24: Nustep L 5 x 6 min In ll bars , balance and coordination activities: On airex heel/toe rocks On airex for alt toe taps to sea urchins In ll bars, alt 3 way hip, with 2 # cuff wts In ll bars for high marching, 2# cuff wts  In ll bars lateral stepping over airex balance beam to improve stride length Knee flexion B 25# 2 x 10 Knee ext 10# 3 x 10 B plantarflexor stretch with forefeet on 3 bar, 15 sec stretch , then B heel raises Sit to stand from hi lo table with feet on airex Semi tandem standing with B shoulder abduction swings with 2# medibar, mass practice  01/08/24: Nustep  LL bars marching on airex Tandem standing 30 sec alt lead foot 3 way hip alt feet 10 x each 1 1/2 # Forward step ups with 1 1/2 # each leg onto 6 step  Standing feet together  In ll bars for alt toe taps to sea urchins B plantarflexor stretch on incline with Heel raises, 20 x Single leg stance 10sec each leg Knee flexion B 25# 2 x 10 Knee ext 10# 2 x 10 Sit to stand form hi lo mat feet on airex Side stepping over airex balance beam in ll bars mass practice  Walking 100' throwing and catching playground ball  Standing feet together, B shoulder flex,ext swings   01/01/24: -Nustep L5x96min -SL Stance >10s BLE -Marches on Airex -Lateral steps on balance beam -Tandem stance on balance beam -Step Ups 2x10 -STS on Airex 2x10   12/26/23: Nustep lvl 5 for 6 min Sit>Stand on Airex Pad 2 x 10 Seated cone taps with hands for vestibular upregulation (3 trials) Resisted Standing Marches with 5  second holds in // bars 2 x 8 each side Hip Abduction with resistance 2 x 8  Knee Ext 10# 2 x 10 Leg Curls 25# 2 x 10 Hip Flexor Stretch at Steps  12/18/23:  Nustep L 5 x 6 min In ll bars for semi tandem standing with lead foot on 2 step horizontal head turns focused on target  In ll bars for heel toe rocks on airex mass practice In ll bars for alt toe taps to cone positioned on airex Sit to stand from hi low table, feet on airex to challenge balance Marching on airex Knee ext B 10 # 3 x 10  B knee flexion 25# 3 x 10  Seated alt punches with opposite marching, then in standing mass practice Standing with 2 # medibar, narrowed stance shoulder abd arm swings Standing 2 # medibar , narrowed stance B shoulder flexion fast   12/12/23: - Vestibular Screening: Convergence = Endorsed improvements in diplopia when moving closer Smooth pursuits = WFL VOR Cancellation = Normal HIT = R (+); L inconclusive d/t muscle guarding - Nustep Lvl 5 for 6 min UE and LE - BERG: 49/56 - Gait Speed: 3.83 seconds or 1.57 m/s - SLS: RLE = 3 seconds; LLE = 10 seconds VOR x 1 (Horizontal and Vertical)  12/04/23: Gait outdoors 4 min Standing semi tandem , alt arm swings with 2# wts each hand 3 sec intervals alternating lead foot Hamstring curls B 25# 10 x 3 Knee ext B 10# 10 x 3 Standing semi tandem lead foot on 2 step, with horizontal head turns, 15 x each  Nustep level 5 x 6 min UE's and LE's Lateral travelling on 2' step mass practice Standing for 3 way hip, no wts today  12 reps each leg  Sit to stand with red mediball punches 10 x Standing on airex heel toe rocks, B UE support 10 x     12/02/23:  Nustep L 5 x 6 min UE and Les Standing for lat step overs, transitioning on and off airex Standing with 2# cuff wts B ankles for alt SLR, alt hip abd, alt hip ext In ll bars for walking on airex balance beam 6 laps Sit to stand from hi low mat with forward punches with 2 # mediball 10x Standing  tandem 30 sec holds Hamstring curls 25#, 3 x 10  Knee ext B 10# 3 x10 Lat step ups 4 step x 15 reps each side Assessed gait speed , walked 100'    11/29/23:  In ll bars for lunges on compliant side of BOSU no UE  In ll bars for heel/toe rocks on airex In ll bars for side stepping length of bars, 4 laps, with 2# cuff weights B In ll bars for alt SLR 2# cuff wts ankles In ll bars for alt hip ext  Nustep L 5 x 6 min Hamstring curls B 20#, 10 x 2 B knee ext 5# 10 x 2 Gait on airex balance beam in ll bars, used hands for support High marches in ll bars with 5 to 7 sec holds Lateral step ups onto lower step, 4 10 each Alt forward toe taps to 6 step 20 reps  11/25/23:  Bike L 2 x 6 min  In ll bars, lunges on compliant side of BOSU 2 x 15 each leg In ll bars, heel/ toe raises on airex In ll bars traveling side to side on airex In ll bars for 3 way hip 2# cuff wts one set 10 each leg  In ll bars side stepping length of bars 2# cuff weights 5 laps In ll bars for semi tandem standing 30 sec holds  In ll bars for forward heel taps 15 reps each leg form 2 step Knee ext 5# 10 x   11/22/23 Bike L2 x20mins  On airex balance eyes open and closed Marching on airex Reaching for numbers on airex Cone taps on airex Tandem 30s  SLS in bars 5s at best Step ups 6 STS goal- 12s Shoulder ext 5# 2x10 Leg press 20# x10, 30# x10   11/20/23:  Sit to stand with red mediball punches 4 x 5 reps Forefeet on 3 bar for B heel raises 2 x 10 Nustep L5 x 6 min Hamstring curls 25# 10 x 2 B knee ext 5#, 2 x 10 Forward step ups onto 4 step B hand hold 10 x each  Balance training: In ll bars:  Airex static standing Airex with B arm swings Forward lunges on bosu compliant side Standing alt hip abd and alt standing SLR with 2 #cuff wts each leg Walking on airex balance beam,  all of balance training mass practice  11/13/23 NuStep L5x37mins  Leg ext 5# 2x10 HS curls 20# 2x10 Calf raises  2x10 Calf stretch on bar 20s x2 STS with chest press 2x10 Walking on beam   11/08/23 LAQ 3# 2x10 HS curls green 2x10 Ball squeeze 2x10 NuStep L5x24mins  In bars hip flexion and abd  On airex balance and then eyes closed  Taps 4  STS 2x10   PATIENT EDUCATION:  Education details: POC, goals  Person educated: Patient Education method: Explanation, Demonstration, Tactile cues, Verbal cues, and Handouts Education comprehension: verbalized understanding, returned demonstration, verbal cues  required, tactile cues required, and needs further education  HOME EXERCISE PROGRAM: Access Code: [email or web address] URL: [email or web address] Date: 11/08/2023 Prepared by: Almetta Fam  Exercises - Heel Toe Raises with Counter Support  - 2 x daily - 7 x weekly - 2 sets - 10 reps - Standing Hip Abduction with Counter Support  - 2 x daily - 7 x weekly - 2 sets - 10 reps - Standing March with Counter Support  - 2 x daily - 7 x weekly - 2 sets - 10 reps - Sit to Stand  - 2 x daily - 7 x weekly - 2 sets - 10 reps -Tandem and SLS    ASSESSMENT:  CLINICAL IMPRESSION: Patient returned for PT intervention due to compromised endurance, balance. We addressed static and dynamic balance challenges, and strength, endurance.  His gait stability continues to improve.  Tolerated today's session well. Pt will benefit from continued PT to meet remaining goals and improve gait speed and mobility.   Patient is a 78 y.o. male who was evaluated today by physical therapy evaluation due to a recent decline in his overall strength, stability, mobility, and hospitalization due to COVID.  Per he and his wife he was declining in stamina and function for 1 -2 months prior to this hospitalization.  He is concerned about losing his balance/ fall risk.  He does demonstrate risk for falls with BERG score of 42/56 and with very slow, guarded gait speed.  Has less stability with isolated weight bearing on L LE vs R.  Vestibular  screening wnl.  Should benefit from physical therapy with focused guided progression with endurance, functional strength and balance training.  OBJECTIVE IMPAIRMENTS: decreased activity tolerance, decreased balance, decreased endurance, decreased knowledge of condition, difficulty walking, decreased strength, and impaired perceived functional ability.   ACTIVITY LIMITATIONS: carrying, bending, standing, squatting, stairs, transfers, and locomotion level  PARTICIPATION LIMITATIONS: meal prep, cleaning, laundry, shopping, and community activity  PERSONAL FACTORS: Age, Behavior pattern, Time since onset of injury/illness/exacerbation, and 1-2 comorbidities: DM are also affecting patient's functional outcome.   REHAB POTENTIAL: Good  CLINICAL DECISION MAKING: Evolving/moderate complexity  EVALUATION COMPLEXITY: Moderate   GOALS: Goals reviewed with patient? Yes  SHORT TERM GOALS: Target date: 2 weeks, 11/20/23 I HEP Baseline: Goal status: MET 11/22/23   LONG TERM GOALS: Target date: 8 weeks, 01/01/24, extended to 02/12/24  Berg improve from 42/56  to 45/56 or greater Baseline:  Goal status: INITIAL; MET 12/12/23  2.  Gait speed consistently greater than 0.8 m/sec Baseline:  Goal status: 0.51 m/sec; MET 12/12/23  3.  5 x sit to stand 14.8 sec or less improved from 16.06 sec Baseline:  Goal status: 12s MET 11/22/23  4. Single leg stance >10s BLE  Baseline:   Goal status: progressing (RLE 3 seconds, LLE 10 seconds) 12/12/23; 3s BLE 01/01/24  5. improve to 800' or better  Baseline: 600;  Goal Status: Initial    PLAN:  PT FREQUENCY: 1x/week  PT DURATION: 8 weeks  PLANNED INTERVENTIONS: 97110-Therapeutic exercises, 97530- Therapeutic activity, 97112- Neuromuscular re-education, 97535- Self Care, [id number]- Manual therapy, and [id number]- Gait training  PLAN FOR NEXT SESSION: cardiovascular equipment, bulk strengthening B LE's and Ue's , trunk, balance progression; vestibular     Magdelena Kinsella L Talmadge Ganas, PT, DPT, OCS 01/15/2024, 8:44 AM

## 2024-01-22 ENCOUNTER — Other Ambulatory Visit: Payer: Self-pay

## 2024-01-22 ENCOUNTER — Ambulatory Visit

## 2024-01-22 DIAGNOSIS — M6281 Muscle weakness (generalized): Secondary | ICD-10-CM | POA: Diagnosis not present

## 2024-01-22 DIAGNOSIS — R262 Difficulty in walking, not elsewhere classified: Secondary | ICD-10-CM

## 2024-01-22 NOTE — Therapy (Signed)
 OUTPATIENT PHYSICAL THERAPY LOWER EXTREMITY TREATMENT   Patient Name: Clifford [name] MRN: [id number] DOB:[birth year], 78 y.o., male Today's Date: 01/22/2024  END OF SESSION:  PT End of Session - 01/22/24 1434     Visit Number 16    Date for Recertification  02/12/24    Progress Note Due on Visit 20    PT Start Time 1430    PT Stop Time 1515    PT Time Calculation (min) 45 min    Activity Tolerance Patient tolerated treatment well    Behavior During Therapy [facility] for tasks assessed/performed               Past Medical History:  Diagnosis Date   CAD (coronary artery disease) 02/27/2003   a. Previous stents RCA and circ, Cath 2005 patient with 70% PDA  //  b. [place]  5/17 >> LHC oLCx 50, pLAD 50, mLAD 95, RPDA 80, normal LVEF >> s/p CABG   Cancer (HCC)    Carotid artery disease    a. Carotid US  5/17 bilat ICA 1-39%   Cataract    replace 11/2012   Diabetes mellitus    diet controlled   History of detached retina repair 11/06/2006   [facility]   History of echocardiogram    a. EF 55% to 60%. Wall motion was normal, Gr 1 DD, Trivial MR/TR   History of hematuria    History of prostate cancer 03/29/2005   s/p prostatectomy   Hyperlipidemia    Hypertension    Past Surgical History:  Procedure Laterality Date   ANGIOPLASTY     1995,2000,2002   CARDIAC CATHETERIZATION  07/26/2000   Initially the stenosis in the proximal to mid right coronary artery was estimated ar 95%. Following stenting, this improved to 0%. There was residual mild disease in the sostium of the proximal right coronary atrtery. Successful stentng of the proximal to mid right coronary artery with improvement in stent diameter narrowing from 95% to 0% Proc Date  07/29/2000   CARDIAC CATHETERIZATION N/A 07/04/2015   Procedure: Left Heart Cath and Coronary Angiography;  Surgeon: Candyce GORMAN Reek, MD;  Location: [facility] INVASIVE CV LAB;  Service: Cardiovascular;  Laterality: N/A;   CATARACT EXTRACTION W/  INTRAOCULAR LENS IMPLANT Bilateral OS Sept '14; OD Oct '14   Dr. camillo   COLONOSCOPY  04/12/2015   Dr.Gessner   CORONARY ARTERY BYPASS GRAFT N/A 07/08/2015   Procedure: CORONARY ARTERY BYPASS GRAFTING (CABG)x three using left internal mammary artery to left anterior decending artery, left greater saphenous vein grafts to diagonal and posterolateral. Left leg greater saphenous leg vein via endoscope. ;  Surgeon: Dallas KATHEE Jude, MD;  Location: [facility] OR;  Service: Open Heart Surgery;  Laterality: N/A;   CYSTOSCOPY N/A 07/08/2015   Procedure: CYSTOSCOPY FLEXIBLE WITH BALLOON DILATION OF BLADDER NECK CONTRACTURE WITH DIFFICULT CATHETER PLACEMENT;  Surgeon: Gretel Ferrara, MD;  Location: [facility] OR;  Service: Urology;  Laterality: N/A;   EYE SURGERY  11/24/2012   left eye cataract   EYE SURGERY  12/22/2012   right eye cataract   HEMORRHOID SURGERY     I&D of thrombosed hemorrhoid   LIPOMA EXCISION     POLYPECTOMY     PROSTATECTOMY  03/29/2005   PTCA  02/27/2003   TEE WITHOUT CARDIOVERSION N/A 07/08/2015   Procedure: TRANSESOPHAGEAL ECHOCARDIOGRAM (TEE);  Surgeon: Dallas KATHEE Jude, MD;  Location: [facility] OR;  Service: Open Heart Surgery;  Laterality: N/A;   WRIST GANGLION EXCISION     Patient Active Problem  List   Diagnosis Date Noted   COVID-19 10/27/2023   Pneumonia due to COVID-19 virus 10/27/2023   Acute hypoxic respiratory failure (HCC) 10/27/2023   Elevated troponin 10/27/2023   Lumbar pain 06/03/2023   Weakness of both legs 06/03/2023   Constipation 12/30/2020   CKD (chronic kidney disease) stage 3, GFR 30-59 ml/min (HCC) 08/01/2015   Carotid artery disease    Hyperlipidemia associated with type 2 diabetes mellitus (HCC) 07/03/2015   Hypertensive heart disease 07/03/2015   Routine health maintenance 12/11/2010   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  Able to sit up from bed more easily PERTINENT HISTORY: The pt is an 78 yo male presenting 8/30 with SpO2 89% on RA and SOB, wife also reports pt sliding off couch x2 in last day and unable to get back up. Work up revealed COVID +, Ddimer +, and mild troponin elevation. He was admitted from 10/26/23 to 10/29/23.  Referred to outpt PT to assist with his recovery of function  PAIN:  Are you having pain? No  PRECAUTIONS: Fall  RED FLAGS: None   WEIGHT BEARING RESTRICTIONS: No  FALLS:  Has patient fallen in last 6 months? No  LIVING ENVIRONMENT: Lives with: lives with their spouse Lives in: House/apartment Stairs: has interior stairs but doesn't use Has following equipment at home: None  OCCUPATION: retired  PLOF: Independent  PATIENT GOALS: The patient wants to have better stability  NEXT MD VISIT: December 2025  OBJECTIVE:  Note: Objective measures were completed at Evaluation unless otherwise noted.  DIAGNOSTIC FINDINGS: na   COGNITION: Overall cognitive status: Within functional limits for tasks assessed     SENSATION: WFL  EDEMA:  None noted  POSTURE: wide base of support noted in standing   LOWER EXTREMITY MNF:hmnddob wnl   LOWER EXTREMITY MMT:all MMT 4/5 except B plantarflexion 3+/5 R shoulder IR and ER 4/5 with shaking, tremulous movement pattern   FUNCTIONAL TESTS:  5 times sit to stand: 16.06 sec Berg 42/56 Gait speed, 3 M in 6.5 sec:  0.46 m/sec Gait with horizontal head turns, up /down turns, eyes closed, all wnl.  Gait backwards very slow, unsteady  GAIT: Distance  walked: in clnic up to 90 ' Assistive device utilized: None Level of assistance: Modified independence Comments: wide base of support, shortened stance time on L arms mid guard, very slow, decreased to no arm swing  TREATMENT DATE:  01/22/24:  In ll bars on airex for heel/ toe rocks In ll bars marching on airex In ll bars alt toe taps to sea urchins from airex Nustep L 5 x 6 min Alt hip abd 2# cuff  Alt ham curls 2# cuff Lunges on compliant surface BOSU in ll bars  Forward step ups onto 4 block  Walking on airex balance bean in ll bars Side stepping on airex balance in ll bars   Leg press 20# 3 x 10 Standing feet together 3# alt arm swings Standing semi tandem 3 # alt arm swings    01/15/24: Nustep L 5 x 6 min In ll bars , balance and coordination activities: On airex heel/toe rocks On airex for alt toe taps to sea urchins In ll bars, alt 3 way hip, with 2 # cuff wts In ll bars for high marching, 2# cuff wts  In ll bars lateral stepping over airex balance beam to improve stride length Knee flexion B 25# 2 x 10 Knee ext 10# 3 x 10 B plantarflexor stretch with forefeet on 3 bar, 15 sec stretch , then B heel raises Sit to stand from hi lo table with feet on airex Semi tandem standing with B shoulder abduction swings with 2# medibar, mass practice  01/08/24: Nustep  LL bars marching on airex Tandem standing 30 sec alt lead foot 3 way hip alt feet 10 x each 1 1/2 # Forward step ups with 1 1/2 # each leg onto 6 step  Standing feet together  In ll bars for alt toe taps to sea urchins B plantarflexor stretch on incline with Heel raises, 20 x Single leg stance 10sec each leg Knee flexion B 25# 2 x 10 Knee ext 10# 2 x 10 Sit to stand form hi lo mat feet on airex Side stepping over airex balance beam in ll bars mass practice  Walking 100' throwing  and catching playground ball  Standing feet together, B shoulder flex,ext swings   01/01/24: -Nustep L5x80min -SL Stance >10s BLE -Marches on Airex -Lateral steps on balance beam -Tandem stance on balance beam -Step Ups 2x10 -STS on Airex 2x10   12/26/23: Nustep lvl 5 for 6 min Sit>Stand on Airex Pad 2 x 10 Seated cone taps with hands for vestibular upregulation (3 trials) Resisted Standing Marches with 5 second holds in // bars 2 x 8 each side Hip Abduction with resistance 2 x 8  Knee Ext 10# 2 x 10 Leg Curls 25# 2 x 10 Hip Flexor Stretch at Steps  12/18/23:  Nustep L 5 x 6 min In ll bars for semi tandem standing with lead foot on 2 step horizontal head turns focused on target  In ll bars for heel toe rocks on airex mass practice In ll bars for alt toe taps to cone positioned on airex Sit to stand from hi low table, feet on airex to challenge balance Marching on airex Knee ext B 10 # 3 x 10  B knee flexion 25# 3 x 10  Seated alt punches with opposite marching, then in standing mass practice Standing with 2 # medibar, narrowed stance shoulder abd arm swings Standing 2 # medibar , narrowed stance B shoulder flexion fast   12/12/23: - Vestibular Screening: Convergence = Endorsed improvements in diplopia when moving closer Smooth pursuits = WFL VOR Cancellation = Normal HIT = R (+); L inconclusive d/t muscle guarding - Nustep Lvl 5 for 6 min UE and LE - BERG:  49/56 - Gait Speed: 3.83 seconds or 1.57 m/s - SLS: RLE = 3 seconds; LLE = 10 seconds VOR x 1 (Horizontal and Vertical)  12/04/23: Gait outdoors 4 min Standing semi tandem , alt arm swings with 2# wts each hand 3 sec intervals alternating lead foot Hamstring curls B 25# 10 x 3 Knee ext B 10# 10 x 3 Standing semi tandem lead foot on 2 step, with horizontal head turns, 15 x each  Nustep level 5 x 6 min UE's and LE's Lateral travelling on 2' step mass practice Standing for 3 way hip, no wts today 12 reps each leg   Sit to stand with red mediball punches 10 x Standing on airex heel toe rocks, B UE support 10 x     12/02/23:  Nustep L 5 x 6 min UE and Les Standing for lat step overs, transitioning on and off airex Standing with 2# cuff wts B ankles for alt SLR, alt hip abd, alt hip ext In ll bars for walking on airex balance beam 6 laps Sit to stand from hi low mat with forward punches with 2 # mediball 10x Standing tandem 30 sec holds Hamstring curls 25#, 3 x 10  Knee ext B 10# 3 x10 Lat step ups 4 step x 15 reps each side Assessed gait speed , walked 100'    11/29/23:  In ll bars for lunges on compliant side of BOSU no UE  In ll bars for heel/toe rocks on airex In ll bars for side stepping length of bars, 4 laps, with 2# cuff weights B In ll bars for alt SLR 2# cuff wts ankles In ll bars for alt hip ext  Nustep L 5 x 6 min Hamstring curls B 20#, 10 x 2 B knee ext 5# 10 x 2 Gait on airex balance beam in ll bars, used hands for support High marches in ll bars with 5 to 7 sec holds Lateral step ups onto lower step, 4 10 each Alt forward toe taps to 6 step 20 reps  11/25/23:  Bike L 2 x 6 min  In ll bars, lunges on compliant side of BOSU 2 x 15 each leg In ll bars, heel/ toe raises on airex In ll bars traveling side to side on airex In ll bars for 3 way hip 2# cuff wts one set 10 each leg  In ll bars side stepping length of bars 2# cuff weights 5 laps In ll bars for semi tandem standing 30 sec holds  In ll bars for forward heel taps 15 reps each leg form 2 step Knee ext 5# 10 x   11/22/23 Bike L2 x94mins  On airex balance eyes open and closed Marching on airex Reaching for numbers on airex Cone taps on airex Tandem 30s  SLS in bars 5s at best Step ups 6 STS goal- 12s Shoulder ext 5# 2x10 Leg press 20# x10, 30# x10   11/20/23:  Sit to stand with red mediball punches 4 x 5 reps Forefeet on 3 bar for B heel raises 2 x 10 Nustep L5 x 6 min Hamstring curls 25# 10 x 2 B  knee ext 5#, 2 x 10 Forward step ups onto 4 step B hand hold 10 x each  Balance training: In ll bars:  Airex static standing Airex with B arm swings Forward lunges on bosu compliant side Standing alt hip abd and alt standing SLR with 2 #cuff wts each leg Walking on airex balance beam,  all of balance training mass practice  11/13/23 NuStep L5x39mins  Leg ext 5# 2x10 HS curls 20# 2x10 Calf raises 2x10 Calf stretch on bar 20s x2 STS with chest press 2x10 Walking on beam   11/08/23 LAQ 3# 2x10 HS curls green 2x10 Ball squeeze 2x10 NuStep L5x82mins  In bars hip flexion and abd  On airex balance and then eyes closed  Taps 4  STS 2x10   PATIENT EDUCATION:  Education details: POC, goals  Person educated: Patient Education method: Explanation, Demonstration, Tactile cues, Verbal cues, and Handouts Education comprehension: verbalized understanding, returned demonstration, verbal cues required, tactile cues required, and needs further education  HOME EXERCISE PROGRAM: Access Code: [email or web address] URL: [email or web address] Date: 11/08/2023 Prepared by: Almetta Fam  Exercises - Heel Toe Raises with Counter Support  - 2 x daily - 7 x weekly - 2 sets - 10 reps - Standing Hip Abduction with Counter Support  - 2 x daily - 7 x weekly - 2 sets - 10 reps - Standing March with Counter Support  - 2 x daily - 7 x weekly - 2 sets - 10 reps - Sit to Stand  - 2 x daily - 7 x weekly - 2 sets - 10 reps -Tandem and SLS    ASSESSMENT:  CLINICAL IMPRESSION: Patient returned for PT intervention due to compromised endurance, balance. We continued to combine balance and stability challenges.  Added leg press today due to reports of lower back stiffness.  He is attending 1 x weekly for an additional 2 weeks.  Pt will benefit from continued PT to meet remaining goals and improve gait speed and mobility, Dc 2nd week of December.  Patient is a 78 y.o. male who was evaluated today by physical  therapy evaluation due to a recent decline in his overall strength, stability, mobility, and hospitalization due to COVID.  Per he and his wife he was declining in stamina and function for 1 -2 months prior to this hospitalization.  He is concerned about losing his balance/ fall risk.  He does demonstrate risk for falls with BERG score of 42/56 and with very slow, guarded gait speed.  Has less stability with isolated weight bearing on L LE vs R.  Vestibular screening wnl.  Should benefit from physical therapy with focused guided progression with endurance, functional strength and balance training.  OBJECTIVE IMPAIRMENTS: decreased activity tolerance, decreased balance, decreased endurance, decreased knowledge of condition, difficulty walking, decreased strength, and impaired perceived functional ability.   ACTIVITY LIMITATIONS: carrying, bending, standing, squatting, stairs, transfers, and locomotion level  PARTICIPATION LIMITATIONS: meal prep, cleaning, laundry, shopping, and community activity  PERSONAL FACTORS: Age, Behavior pattern, Time since onset of injury/illness/exacerbation, and 1-2 comorbidities: DM are also affecting patient's functional outcome.   REHAB POTENTIAL: Good  CLINICAL DECISION MAKING: Evolving/moderate complexity  EVALUATION COMPLEXITY: Moderate   GOALS: Goals reviewed with patient? Yes  SHORT TERM GOALS: Target date: 2 weeks, 11/20/23 I HEP Baseline: Goal status: MET 11/22/23   LONG TERM GOALS: Target date: 8 weeks, 01/01/24, extended to 02/12/24  Berg improve from 42/56  to 45/56 or greater Baseline:  Goal status: INITIAL; MET 12/12/23  2.  Gait speed consistently greater than 0.8 m/sec Baseline:  Goal status: 0.51 m/sec; MET 12/12/23  3.  5 x sit to stand 14.8 sec or less improved from 16.06 sec Baseline:  Goal status: 12s MET 11/22/23  4. Single leg stance >10s BLE  Baseline:   Goal status: progressing (RLE 3 seconds, LLE 10 seconds)  12/12/23; 3s BLE  01/01/24  5. improve to 800' or better  Baseline: 600;  Goal Status: Initial    PLAN:  PT FREQUENCY: 1x/week  PT DURATION: 8 weeks  PLANNED INTERVENTIONS: 97110-Therapeutic exercises, 97530- Therapeutic activity, 97112- Neuromuscular re-education, 97535- Self Care, [id number]- Manual therapy, and 97116- Gait training  PLAN FOR NEXT SESSION: cardiovascular equipment, bulk strengthening B LE's and Ue's , trunk, balance progression; vestibular    Kayloni Rocco L Nakeita Styles, PT, DPT, OCS 01/22/2024, 3:25 PM

## 2024-01-24 ENCOUNTER — Other Ambulatory Visit: Payer: Self-pay | Admitting: Internal Medicine

## 2024-01-28 ENCOUNTER — Ambulatory Visit

## 2024-01-28 VITALS — Ht 62.0 in | Wt 169.0 lb

## 2024-01-28 DIAGNOSIS — Z0000 Encounter for general adult medical examination without abnormal findings: Secondary | ICD-10-CM | POA: Diagnosis not present

## 2024-01-28 NOTE — Patient Instructions (Addendum)
 Clifford [name],  Thank you for taking the time for your Medicare Wellness Visit. I appreciate your continued commitment to your health goals. Please review the care plan we discussed, and feel free to reach out if I can assist you further.  Please note that Annual Wellness Visits do not include a physical exam. Some assessments may be limited, especially if the visit was conducted virtually. If needed, we may recommend an in-person follow-up with your provider.  Ongoing Care Seeing your primary care provider every 3 to 6 months helps us  monitor your health and provide consistent, personalized care. Next office visit on 02/24/2024.  Remember to discuss the Shingrix vaccine with your provider during your next office visit.  You are also due for a kidney evaluation (urine sample), which can be done during your next office visit.    Referrals If a referral was made during today's visit and you haven't received any updates within two weeks, please contact the referred provider directly to check on the status.  Recommended Screenings:  Health Maintenance  Topic Date Due   Yearly kidney health urinalysis for diabetes  Never done   Zoster (Shingles) Vaccine (1 of 2) 06/14/1993   Flu Shot  09/27/2023   COVID-19 Vaccine (5 - 2025-26 season) 10/28/2023   Medicare Annual Wellness Visit  02/15/2024   Complete foot exam   02/15/2024   Hemoglobin A1C  02/18/2024   Eye exam for diabetics  04/29/2024   Yearly kidney function blood test for diabetes  10/28/2024   Colon Cancer Screening  11/23/2025   DTaP/Tdap/Td vaccine (3 - Td or Tdap) 12/21/2030   Pneumococcal Vaccine for age over 61  Completed   Meningitis B Vaccine  Aged Out   Hepatitis C Screening  Discontinued       01/28/2024    9:00 AM  Advanced Directives  Does Patient Have a Medical Advance Directive? Yes  Type of Estate Agent of [place];Living will    Vision: Annual vision screenings are recommended for early  detection of glaucoma, cataracts, and diabetic retinopathy. These exams can also reveal signs of chronic conditions such as diabetes and high blood pressure.  Dental: Annual dental screenings help detect early signs of oral cancer, gum disease, and other conditions linked to overall health, including heart disease and diabetes.  Please see the attached documents for additional preventive care recommendations.

## 2024-01-28 NOTE — Progress Notes (Signed)
 Chief Complaint  Patient presents with   Medicare Wellness    /     Subjective:   Clifford [name] is a 78 y.o. male who presents for a Medicare Annual Wellness Visit.  Visit info / Clinical Intake: Medicare Wellness Visit Type:: Subsequent Annual Wellness Visit Persons participating in visit and providing information:: patient Medicare Wellness Visit Mode:: Telephone If telephone:: video declined If Telephone or Video please confirm:: I connected with patient using audio/video enable telemedicine. I verified patient identity with two identifiers, discussed telehealth limitations, and patient agreed to proceed. Patient Location:: Home Provider Location:: Office Interpreter Needed?: No Pre-visit prep was completed: yes AWV questionnaire completed by patient prior to visit?: no Living arrangements:: lives with spouse/significant other Patient's Overall Health Status Rating: very good Typical amount of pain: none Does pain affect daily life?: no Are you currently prescribed opioids?: no  Dietary Habits and Nutritional Risks How many meals a day?: 2 Eats fruit and vegetables daily?: (!) no Most meals are obtained by: preparing own meals In the last 2 weeks, have you had any of the following?: none Diabetic:: (!) yes Any non-healing wounds?: no How often do you check your BS?: 1 (every day once) Would you like to be referred to a Nutritionist or for Diabetic Management? : no  Functional Status Activities of Daily Living (to include ambulation/medication): Independent Ambulation: Independent Medication Administration: Independent Home Management (perform basic housework or laundry): Independent Manage your own finances?: yes Primary transportation is: driving Concerns about vision?: no *vision screening is required for WTM* Concerns about hearing?: no  Fall Screening Falls in the past year?: 0 Number of falls in past year: 0 Was there an injury with Fall?: 0 Fall  Risk Category Calculator: 0 Patient Fall Risk Level: Low Fall Risk  Fall Risk Patient at Risk for Falls Due to: No Fall Risks Fall risk Follow up: Falls evaluation completed; Falls prevention discussed  Home and Transportation Safety: All rugs have non-skid backing?: yes All stairs or steps have railings?: yes Grab bars in the bathtub or shower?: yes Have non-skid surface in bathtub or shower?: yes Good home lighting?: yes Regular seat belt use?: yes Hospital stays in the last year:: (!) yes How many hospital stays:: 4 Reason: Flu  Cognitive Assessment Difficulty concentrating, remembering, or making decisions? : no Will 6CIT or Mini Cog be Completed: yes What year is it?: 0 points What month is it?: 0 points Give patient an address phrase to remember (5 components): [address] [place], [place] About what time is it?: 0 points Count backwards from 20 to 1: 0 points Say the months of the year in reverse: 0 points Repeat the address phrase from earlier: 0 points 6 CIT Score: 0 points  Advance Directives (For Healthcare) Does Patient Have a Medical Advance Directive?: Yes Does patient want to make changes to medical advance directive?: No - Patient declined Type of Advance Directive: Healthcare Power of [place]; Living will Copy of Living Will in Chart?: No - copy requested  Reviewed/Updated  Reviewed/Updated: Reviewed All (Medical, Surgical, Family, Medications, Allergies, Care Teams, Patient Goals)    Allergies (verified) Cinnamon   Current Medications (verified) Outpatient Encounter Medications as of 01/28/2024  Medication Sig   aspirin  EC 81 MG tablet Take 1 tablet (81 mg total) by mouth daily.   glimepiride  (AMARYL ) 2 MG tablet TAKE ONE TABLET BY MOUTH ONCE A DAY WITH BREAKFAST   guaiFENesin  (ROBITUSSIN) 100 MG/5ML liquid Take 5 mLs by  mouth every 4 (four) hours as needed for cough or to loosen phlegm.   lisinopril  (ZESTRIL ) 20 MG tablet Take 1 tablet (20 mg total)  by mouth daily.   Multiple Vitamins-Minerals (MENS 50+ MULTI VITAMIN/MIN) TABS Take 1 tablet by mouth daily.   ONE TOUCH ULTRA TEST test strip 1 each by Other route 2 (two) times daily. Use to check blood sugars twice a day Dx E11.9   ONETOUCH DELICA LANCETS 33G MISC Use to help check blood sugars twice a day Dx e11.9   rosuvastatin  (CRESTOR ) 40 MG tablet Take 1 tablet (40 mg total) by mouth daily.   No facility-administered encounter medications on file as of 01/28/2024.    History: Past Medical History:  Diagnosis Date   CAD (coronary artery disease) 02/27/2003   a. Previous stents RCA and circ, Cath 2005 patient with 70% PDA  //  b. [place]  5/17 >> LHC oLCx 50, pLAD 50, mLAD 95, RPDA 80, normal LVEF >> s/p CABG   Cancer (HCC)    Carotid artery disease    a. Carotid US  5/17 bilat ICA 1-39%   Cataract    replace 11/2012   Diabetes mellitus    diet controlled   History of detached retina repair 11/06/2006   [facility]   History of echocardiogram    a. EF 55% to 60%. Wall motion was normal, Gr 1 DD, Trivial MR/TR   History of hematuria    History of prostate cancer 03/29/2005   s/p prostatectomy   Hyperlipidemia    Hypertension    Past Surgical History:  Procedure Laterality Date   ANGIOPLASTY     1995,2000,2002   CARDIAC CATHETERIZATION  07/26/2000   Initially the stenosis in the proximal to mid right coronary artery was estimated ar 95%. Following stenting, this improved to 0%. There was residual mild disease in the sostium of the proximal right coronary atrtery. Successful stentng of the proximal to mid right coronary artery with improvement in stent diameter narrowing from 95% to 0% Proc Date  07/29/2000   CARDIAC CATHETERIZATION N/A 07/04/2015   Procedure: Left Heart Cath and Coronary Angiography;  Surgeon: Candyce GORMAN Reek, MD;  Location: [facility] INVASIVE CV LAB;  Service: Cardiovascular;  Laterality: N/A;   CATARACT EXTRACTION W/ INTRAOCULAR LENS IMPLANT Bilateral OS Sept  '14; OD Oct '14   Dr. camillo   COLONOSCOPY  04/12/2015   Dr.Gessner   CORONARY ARTERY BYPASS GRAFT N/A 07/08/2015   Procedure: CORONARY ARTERY BYPASS GRAFTING (CABG)x three using left internal mammary artery to left anterior decending artery, left greater saphenous vein grafts to diagonal and posterolateral. Left leg greater saphenous leg vein via endoscope. ;  Surgeon: Dallas KATHEE Jude, MD;  Location: [facility] OR;  Service: Open Heart Surgery;  Laterality: N/A;   CYSTOSCOPY N/A 07/08/2015   Procedure: CYSTOSCOPY FLEXIBLE WITH BALLOON DILATION OF BLADDER NECK CONTRACTURE WITH DIFFICULT CATHETER PLACEMENT;  Surgeon: Gretel Ferrara, MD;  Location: [facility] OR;  Service: Urology;  Laterality: N/A;   EYE SURGERY  11/24/2012   left eye cataract   EYE SURGERY  12/22/2012   right eye cataract   HEMORRHOID SURGERY     I&D of thrombosed hemorrhoid   LIPOMA EXCISION     POLYPECTOMY     PROSTATECTOMY  03/29/2005   PTCA  02/27/2003   TEE WITHOUT CARDIOVERSION N/A 07/08/2015   Procedure: TRANSESOPHAGEAL ECHOCARDIOGRAM (TEE);  Surgeon: Dallas KATHEE Jude, MD;  Location: [facility] OR;  Service: Open Heart Surgery;  Laterality: N/A;   WRIST GANGLION EXCISION  Family History  Problem Relation Age of Onset   CAD Father    Diabetes Sister    Colon polyps Sister    Cancer Neg Hx        Colon   Colon cancer Neg Hx    Esophageal cancer Neg Hx    Rectal cancer Neg Hx    Stomach cancer Neg Hx    Social History   Occupational History   Occupation: RETIRED  Tobacco Use   Smoking status: Former    Current packs/day: 0.00    Types: Cigarettes    Quit date: 02/27/1983    Years since quitting: 40.9   Smokeless tobacco: Never  Vaping Use   Vaping status: Never Used  Substance and Sexual Activity   Alcohol  use: No    Alcohol /week: 0.0 standard drinks of alcohol    Drug use: No   Sexual activity: Yes    Partners: Female   Tobacco Counseling Counseling given: Not Answered  SDOH Screenings   Food Insecurity: No  Food Insecurity (01/28/2024)  Housing: Unknown (01/28/2024)  Transportation Needs: No Transportation Needs (01/28/2024)  Utilities: Not At Risk (01/28/2024)  Depression (PHQ2-9): Low Risk  (01/28/2024)  Physical Activity: Sufficiently Active (01/28/2024)  Social Connections: Socially Integrated (01/28/2024)  Stress: No Stress Concern Present (01/28/2024)  Tobacco Use: Medium Risk (01/28/2024)  Health Literacy: Adequate Health Literacy (01/28/2024)   See flowsheets for full screening details  Depression Screen PHQ 2 & 9 Depression Scale- Over the past 2 weeks, how often have you been bothered by any of the following problems? Little interest or pleasure in doing things: 0 Feeling down, depressed, or hopeless (PHQ Adolescent also includes...irritable): 0 PHQ-2 Total Score: 0 Trouble falling or staying asleep, or sleeping too much: 0 Feeling tired or having little energy: 0 Poor appetite or overeating (PHQ Adolescent also includes...weight loss): 0 Feeling bad about yourself - or that you are a failure or have let yourself or your family down: 0 Trouble concentrating on things, such as reading the newspaper or watching television (PHQ Adolescent also includes...like school work): 0 Moving or speaking so slowly that other people could have noticed. Or the opposite - being so fidgety or restless that you have been moving around a lot more than usual: 0 PHQ-9 Total Score: 0 If you checked off any problems, how difficult have these problems made it for you to do your work, take care of things at home, or get along with other people?: Not difficult at all  Depression Treatment Depression Interventions/Treatment : EYV7-0 Score <4 Follow-up Not Indicated     Goals Addressed   None          Objective:    Today's Vitals   01/28/24 0856  Weight: 169 lb (76.7 kg)  Height: 5' 2 (1.575 m)   Body mass index is 30.91 kg/m.  Hearing/Vision screen Hearing Screening - Comments:: Denies hearing  difficulties   Vision Screening - Comments:: Denies vision issues./[facility]  Immunizations and Health Maintenance Health Maintenance  Topic Date Due   Diabetic kidney evaluation - Urine ACR  Never done   Zoster Vaccines- Shingrix (1 of 2) 06/14/1993   Influenza Vaccine  09/27/2023   COVID-19 Vaccine (5 - 2025-26 season) 10/28/2023   FOOT EXAM  02/15/2024   HEMOGLOBIN A1C  02/18/2024   OPHTHALMOLOGY EXAM  04/29/2024   Diabetic kidney evaluation - eGFR measurement  10/28/2024   Medicare Annual Wellness (AWV)  01/27/2025   Colonoscopy  11/23/2025   DTaP/Tdap/Td (3 -  Td or Tdap) 12/21/2030   Pneumococcal Vaccine: 50+ Years  Completed   Meningococcal B Vaccine  Aged Out   Hepatitis C Screening  Discontinued        Assessment/Plan:  This is a routine wellness examination for Ersel.  Patient Care Team: Rollene Almarie LABOR, MD as PCP - General (Internal Medicine) Delford Maude BROCKS, MD as PCP - Cardiology (Cardiology) Delford Maude BROCKS, MD (Cardiology) Renda Glance, MD (Urology) Camillo Golas, MD (Ophthalmology) Edyth Ismael POUR., MD (Ophthalmology) Avram Lupita BRAVO, MD (Gastroenterology)  I have personally reviewed and noted the following in the patient's chart:   Medical and social history Use of alcohol , tobacco or illicit drugs  Current medications and supplements including opioid prescriptions. Functional ability and status Nutritional status Physical activity Advanced directives List of other physicians Hospitalizations, surgeries, and ER visits in previous 12 months Vitals Screenings to include cognitive, depression, and falls Referrals and appointments  No orders of the defined types were placed in this encounter.  In addition, I have reviewed and discussed with patient certain preventive protocols, quality metrics, and best practice recommendations. A written personalized care plan for preventive services as well as general preventive health recommendations  were provided to patient.   Libbey Duce L Reina Wilton, CMA   01/28/2024   Return in 1 year (on 01/27/2025).  After Visit Summary: (MyChart) Due to this being a telephonic visit, the after visit summary with patients personalized plan was offered to patient via MyChart   Nurse Notes: Patient stated that he received a Flu vaccine during his hospitalization in September.  He is due for a UACR, which he can get during his next office visit with provider.  Patient is due for a Shingrix vaccine, however, he would like to discuss during his next office visit as well.  He had no other concerns to address today.

## 2024-01-29 ENCOUNTER — Ambulatory Visit: Attending: Internal Medicine

## 2024-01-29 ENCOUNTER — Other Ambulatory Visit: Payer: Self-pay

## 2024-01-29 DIAGNOSIS — M6281 Muscle weakness (generalized): Secondary | ICD-10-CM | POA: Insufficient documentation

## 2024-01-29 DIAGNOSIS — R262 Difficulty in walking, not elsewhere classified: Secondary | ICD-10-CM | POA: Diagnosis present

## 2024-01-29 NOTE — Progress Notes (Signed)
 Chief Complaint  Patient presents with   Medicare Wellness    /     Subjective:   Clifford [name] is a 78 y.o. male who presents for a Medicare Annual Wellness Visit.  Visit info / Clinical Intake: Medicare Wellness Visit Type:: Subsequent Annual Wellness Visit Persons participating in visit and providing information:: patient Medicare Wellness Visit Mode:: Telephone If telephone:: video declined Since this visit was completed virtually, some vitals may be partially provided or unavailable. Missing vitals are due to the limitations of the virtual format.: Unable to obtain vitals - no equipment If Telephone or Video please confirm:: I connected with patient using audio/video enable telemedicine. I verified patient identity with two identifiers, discussed telehealth limitations, and patient agreed to proceed. Patient Location:: Home Provider Location:: Office Interpreter Needed?: No Pre-visit prep was completed: yes AWV questionnaire completed by patient prior to visit?: no Living arrangements:: lives with spouse/significant other Patient's Overall Health Status Rating: very good Typical amount of pain: none Does pain affect daily life?: no Are you currently prescribed opioids?: no  Dietary Habits and Nutritional Risks How many meals a day?: 2 Eats fruit and vegetables daily?: (!) no Most meals are obtained by: preparing own meals In the last 2 weeks, have you had any of the following?: none Diabetic:: (!) yes Any non-healing wounds?: no How often do you check your BS?: 1 (every day once) Would you like to be referred to a Nutritionist or for Diabetic Management? : no  Functional Status Activities of Daily Living (to include ambulation/medication): Independent Ambulation: Independent Medication Administration: Independent Home Management (perform basic housework or laundry): Independent Manage your own finances?: yes Primary transportation is: driving Concerns about  vision?: no *vision screening is required for WTM* Concerns about hearing?: no  Fall Screening Falls in the past year?: 0 Number of falls in past year: 0 Was there an injury with Fall?: 0 Fall Risk Category Calculator: 0 Patient Fall Risk Level: Low Fall Risk  Fall Risk Patient at Risk for Falls Due to: No Fall Risks Fall risk Follow up: Falls evaluation completed; Falls prevention discussed  Home and Transportation Safety: All rugs have non-skid backing?: yes All stairs or steps have railings?: yes Grab bars in the bathtub or shower?: yes Have non-skid surface in bathtub or shower?: yes Good home lighting?: yes Regular seat belt use?: yes Hospital stays in the last year:: (!) yes How many hospital stays:: 4 Reason: Flu  Cognitive Assessment Difficulty concentrating, remembering, or making decisions? : no Will 6CIT or Mini Cog be Completed: yes What year is it?: 0 points What month is it?: 0 points Give patient an address phrase to remember (5 components): [address] [place], [place] About what time is it?: 0 points Count backwards from 20 to 1: 0 points Say the months of the year in reverse: 0 points Repeat the address phrase from earlier: 0 points 6 CIT Score: 0 points  Advance Directives (For Healthcare) Does Patient Have a Medical Advance Directive?: Yes Does patient want to make changes to medical advance directive?: No - Patient declined Type of Advance Directive: Healthcare Power of [place]; Living will Copy of Living Will in Chart?: No - copy requested  Reviewed/Updated  Reviewed/Updated: Reviewed All (Medical, Surgical, Family, Medications, Allergies, Care Teams, Patient Goals)    Allergies (verified) Cinnamon   Current Medications (verified) Outpatient Encounter Medications as of 01/28/2024  Medication Sig   aspirin  EC 81 MG tablet Take 1 tablet (81 mg  total) by mouth daily.   glimepiride  (AMARYL ) 2 MG tablet TAKE ONE TABLET BY MOUTH ONCE A DAY WITH  BREAKFAST   guaiFENesin  (ROBITUSSIN) 100 MG/5ML liquid Take 5 mLs by mouth every 4 (four) hours as needed for cough or to loosen phlegm.   lisinopril  (ZESTRIL ) 20 MG tablet Take 1 tablet (20 mg total) by mouth daily.   Multiple Vitamins-Minerals (MENS 50+ MULTI VITAMIN/MIN) TABS Take 1 tablet by mouth daily.   ONE TOUCH ULTRA TEST test strip 1 each by Other route 2 (two) times daily. Use to check blood sugars twice a day Dx E11.9   ONETOUCH DELICA LANCETS 33G MISC Use to help check blood sugars twice a day Dx e11.9   rosuvastatin  (CRESTOR ) 40 MG tablet Take 1 tablet (40 mg total) by mouth daily.   No facility-administered encounter medications on file as of 01/28/2024.    History: Past Medical History:  Diagnosis Date   CAD (coronary artery disease) 02/27/2003   a. Previous stents RCA and circ, Cath 2005 patient with 70% PDA  //  b. [place]  5/17 >> LHC oLCx 50, pLAD 50, mLAD 95, RPDA 80, normal LVEF >> s/p CABG   Cancer (HCC)    Carotid artery disease    a. Carotid US  5/17 bilat ICA 1-39%   Cataract    replace 11/2012   Diabetes mellitus    diet controlled   History of detached retina repair 11/06/2006   [facility]   History of echocardiogram    a. EF 55% to 60%. Wall motion was normal, Gr 1 DD, Trivial MR/TR   History of hematuria    History of prostate cancer 03/29/2005   s/p prostatectomy   Hyperlipidemia    Hypertension    Past Surgical History:  Procedure Laterality Date   ANGIOPLASTY     1995,2000,2002   CARDIAC CATHETERIZATION  07/26/2000   Initially the stenosis in the proximal to mid right coronary artery was estimated ar 95%. Following stenting, this improved to 0%. There was residual mild disease in the sostium of the proximal right coronary atrtery. Successful stentng of the proximal to mid right coronary artery with improvement in stent diameter narrowing from 95% to 0% Proc Date  07/29/2000   CARDIAC CATHETERIZATION N/A 07/04/2015   Procedure: Left Heart Cath and  Coronary Angiography;  Surgeon: Candyce GORMAN Reek, MD;  Location: [facility] INVASIVE CV LAB;  Service: Cardiovascular;  Laterality: N/A;   CATARACT EXTRACTION W/ INTRAOCULAR LENS IMPLANT Bilateral OS Sept '14; OD Oct '14   Dr. camillo   COLONOSCOPY  04/12/2015   Dr.Gessner   CORONARY ARTERY BYPASS GRAFT N/A 07/08/2015   Procedure: CORONARY ARTERY BYPASS GRAFTING (CABG)x three using left internal mammary artery to left anterior decending artery, left greater saphenous vein grafts to diagonal and posterolateral. Left leg greater saphenous leg vein via endoscope. ;  Surgeon: Dallas KATHEE Jude, MD;  Location: [facility] OR;  Service: Open Heart Surgery;  Laterality: N/A;   CYSTOSCOPY N/A 07/08/2015   Procedure: CYSTOSCOPY FLEXIBLE WITH BALLOON DILATION OF BLADDER NECK CONTRACTURE WITH DIFFICULT CATHETER PLACEMENT;  Surgeon: Gretel Ferrara, MD;  Location: [facility] OR;  Service: Urology;  Laterality: N/A;   EYE SURGERY  11/24/2012   left eye cataract   EYE SURGERY  12/22/2012   right eye cataract   HEMORRHOID SURGERY     I&D of thrombosed hemorrhoid   LIPOMA EXCISION     POLYPECTOMY     PROSTATECTOMY  03/29/2005   PTCA  02/27/2003   TEE WITHOUT  CARDIOVERSION N/A 07/08/2015   Procedure: TRANSESOPHAGEAL ECHOCARDIOGRAM (TEE);  Surgeon: Dallas KATHEE Jude, MD;  Location: [facility] OR;  Service: Open Heart Surgery;  Laterality: N/A;   WRIST GANGLION EXCISION     Family History  Problem Relation Age of Onset   CAD Father    Diabetes Sister    Colon polyps Sister    Cancer Neg Hx        Colon   Colon cancer Neg Hx    Esophageal cancer Neg Hx    Rectal cancer Neg Hx    Stomach cancer Neg Hx    Social History   Occupational History   Occupation: RETIRED  Tobacco Use   Smoking status: Former    Current packs/day: 0.00    Types: Cigarettes    Quit date: 02/27/1983    Years since quitting: 40.9   Smokeless tobacco: Never  Vaping Use   Vaping status: Never Used  Substance and Sexual Activity   Alcohol  use: No     Alcohol /week: 0.0 standard drinks of alcohol    Drug use: No   Sexual activity: Yes    Partners: Female   Tobacco Counseling Counseling given: Not Answered  SDOH Screenings   Food Insecurity: No Food Insecurity (01/28/2024)  Housing: Unknown (01/28/2024)  Transportation Needs: No Transportation Needs (01/28/2024)  Utilities: Not At Risk (01/28/2024)  Depression (PHQ2-9): Low Risk  (01/28/2024)  Physical Activity: Sufficiently Active (01/28/2024)  Social Connections: Socially Integrated (01/28/2024)  Stress: No Stress Concern Present (01/28/2024)  Tobacco Use: Medium Risk (01/29/2024)  Health Literacy: Adequate Health Literacy (01/28/2024)   See flowsheets for full screening details  Depression Screen PHQ 2 & 9 Depression Scale- Over the past 2 weeks, how often have you been bothered by any of the following problems? Little interest or pleasure in doing things: 0 Feeling down, depressed, or hopeless (PHQ Adolescent also includes...irritable): 0 PHQ-2 Total Score: 0 Trouble falling or staying asleep, or sleeping too much: 0 Feeling tired or having little energy: 0 Poor appetite or overeating (PHQ Adolescent also includes...weight loss): 0 Feeling bad about yourself - or that you are a failure or have let yourself or your family down: 0 Trouble concentrating on things, such as reading the newspaper or watching television (PHQ Adolescent also includes...like school work): 0 Moving or speaking so slowly that other people could have noticed. Or the opposite - being so fidgety or restless that you have been moving around a lot more than usual: 0 PHQ-9 Total Score: 0 If you checked off any problems, how difficult have these problems made it for you to do your work, take care of things at home, or get along with other people?: Not difficult at all  Depression Treatment Depression Interventions/Treatment : [id number] Score <4 Follow-up Not Indicated     Goals Addressed   None           Objective:    Today's Vitals   01/28/24 0856  Weight: 169 lb (76.7 kg)  Height: 5' 2 (1.575 m)   Body mass index is 30.91 kg/m.  Hearing/Vision screen Hearing Screening - Comments:: Denies hearing difficulties   Vision Screening - Comments:: Denies vision issues./[facility]  Immunizations and Health Maintenance Health Maintenance  Topic Date Due   Diabetic kidney evaluation - Urine ACR  Never done   Zoster Vaccines- Shingrix (1 of 2) 06/14/1993   Influenza Vaccine  09/27/2023   COVID-19 Vaccine (5 - 2025-26 season) 10/28/2023   FOOT EXAM  02/15/2024   HEMOGLOBIN A1C  02/18/2024   OPHTHALMOLOGY EXAM  04/29/2024   Diabetic kidney evaluation - eGFR measurement  10/28/2024   Medicare Annual Wellness (AWV)  01/27/2025   Colonoscopy  11/23/2025   DTaP/Tdap/Td (3 - Td or Tdap) 12/21/2030   Pneumococcal Vaccine: 50+ Years  Completed   Meningococcal B Vaccine  Aged Out   Hepatitis C Screening  Discontinued        Assessment/Plan:  This is a routine wellness examination for [place].  Patient Care Team: Rollene Almarie LABOR, MD as PCP - General (Internal Medicine) Delford Maude BROCKS, MD as PCP - Cardiology (Cardiology) Delford Maude BROCKS, MD (Cardiology) Renda Glance, MD (Urology) Camillo Golas, MD (Ophthalmology) Edyth Ismael POUR., MD (Ophthalmology) Avram Lupita BRAVO, MD (Gastroenterology)  I have personally reviewed and noted the following in the patient's chart:   Medical and social history Use of alcohol , tobacco or illicit drugs  Current medications and supplements including opioid prescriptions. Functional ability and status Nutritional status Physical activity Advanced directives List of other physicians Hospitalizations, surgeries, and ER visits in previous 12 months Vitals Screenings to include cognitive, depression, and falls Referrals and appointments  No orders of the defined types were placed in this encounter.  In addition, I have reviewed and  discussed with patient certain preventive protocols, quality metrics, and best practice recommendations. A written personalized care plan for preventive services as well as general preventive health recommendations were provided to patient.   Hilda Wexler L Edan Serratore, CMA   01/28/2024  Return in 1 year (on 01/27/2025).  After Visit Summary: (MyChart) Due to this being a telephonic visit, the after visit summary with patients personalized plan was offered to patient via MyChart   Nurse Notes: Patient stated that he received a Flu vaccine during his hospitalization in September.  He is due for a UACR, which he can get during his next office visit with provider.  Patient is due for a Shingrix vaccine, however, he would like to discuss during his next office visit as well.  He had no other concerns to address today.

## 2024-01-29 NOTE — Therapy (Signed)
 OUTPATIENT PHYSICAL THERAPY LOWER EXTREMITY TREATMENT   Patient Name: Clifford [name] MRN: [id number] DOB:Jul 23, 1945, 78 y.o., male Today's Date: 01/29/2024  END OF SESSION:  PT End of Session - 01/29/24 0806     Visit Number 17    Date for Recertification  02/12/24    Progress Note Due on Visit 20    PT Start Time 0802    PT Stop Time 0845    PT Time Calculation (min) 43 min    Activity Tolerance Patient tolerated treatment well    Behavior During Therapy [facility] for tasks assessed/performed                Past Medical History:  Diagnosis Date   CAD (coronary artery disease) 02/27/2003   a. Previous stents RCA and circ, Cath 2005 patient with 70% PDA  //  b. [place]  5/17 >> LHC oLCx 50, pLAD 50, mLAD 95, RPDA 80, normal LVEF >> s/p CABG   Cancer (HCC)    Carotid artery disease    a. Carotid US  5/17 bilat ICA 1-39%   Cataract    replace 11/2012   Diabetes mellitus    diet controlled   History of detached retina repair 11/06/2006   [facility]   History of echocardiogram    a. EF 55% to 60%. Wall motion was normal, Gr 1 DD, Trivial MR/TR   History of hematuria    History of prostate cancer 03/29/2005   s/p prostatectomy   Hyperlipidemia    Hypertension    Past Surgical History:  Procedure Laterality Date   ANGIOPLASTY     1995,2000,2002   CARDIAC CATHETERIZATION  07/26/2000   Initially the stenosis in the proximal to mid right coronary artery was estimated ar 95%. Following stenting, this improved to 0%. There was residual mild disease in the sostium of the proximal right coronary atrtery. Successful stentng of the proximal to mid right coronary artery with improvement in stent diameter narrowing from 95% to 0% Proc Date  07/29/2000   CARDIAC CATHETERIZATION N/A 07/04/2015   Procedure: Left Heart Cath and Coronary Angiography;  Surgeon: Candyce GORMAN Reek, MD;  Location: [facility] INVASIVE CV LAB;  Service: Cardiovascular;  Laterality: N/A;   CATARACT EXTRACTION W/  INTRAOCULAR LENS IMPLANT Bilateral OS Sept '14; OD Oct '14   Dr. camillo   COLONOSCOPY  04/12/2015   Dr.Gessner   CORONARY ARTERY BYPASS GRAFT N/A 07/08/2015   Procedure: CORONARY ARTERY BYPASS GRAFTING (CABG)x three using left internal mammary artery to left anterior decending artery, left greater saphenous vein grafts to diagonal and posterolateral. Left leg greater saphenous leg vein via endoscope. ;  Surgeon: Dallas KATHEE Jude, MD;  Location: [facility] OR;  Service: Open Heart Surgery;  Laterality: N/A;   CYSTOSCOPY N/A 07/08/2015   Procedure: CYSTOSCOPY FLEXIBLE WITH BALLOON DILATION OF BLADDER NECK CONTRACTURE WITH DIFFICULT CATHETER PLACEMENT;  Surgeon: Gretel Ferrara, MD;  Location: [facility] OR;  Service: Urology;  Laterality: N/A;   EYE SURGERY  11/24/2012   left eye cataract   EYE SURGERY  12/22/2012   right eye cataract   HEMORRHOID SURGERY     I&D of thrombosed hemorrhoid   LIPOMA EXCISION     POLYPECTOMY     PROSTATECTOMY  03/29/2005   PTCA  02/27/2003   TEE WITHOUT CARDIOVERSION N/A 07/08/2015   Procedure: TRANSESOPHAGEAL ECHOCARDIOGRAM (TEE);  Surgeon: Dallas KATHEE Jude, MD;  Location: [facility] OR;  Service: Open Heart Surgery;  Laterality: N/A;   WRIST GANGLION EXCISION     Patient Active  Problem List   Diagnosis Date Noted   COVID-19 10/27/2023   Pneumonia due to COVID-19 virus 10/27/2023   Acute hypoxic respiratory failure (HCC) 10/27/2023   Elevated troponin 10/27/2023   Lumbar pain 06/03/2023   Weakness of both legs 06/03/2023   Constipation 12/30/2020   CKD (chronic kidney disease) stage 3, GFR 30-59 ml/min (HCC) 08/01/2015   Carotid artery disease    Hyperlipidemia associated with type 2 diabetes mellitus (HCC) 07/03/2015   Hypertensive heart disease 07/03/2015   Routine health maintenance 12/11/2010   Diabetes mellitus type 2 with complications (HCC) 07/23/2007   Essential hypertension 03/07/2007   Coronary atherosclerosis 03/07/2007   PROSTATE CANCER, HX OF 03/07/2007     PCP: Rollene Norris, MD  REFERRING PROVIDER: Darci Pore, MD  REFERRING DIAG: dyspnea upon exertion  THERAPY DIAG:  Muscle weakness (generalized)  Difficulty in walking, not elsewhere classified  Rationale for Evaluation and Treatment: Rehabilitation  ONSET DATE: 10/26/23  SUBJECTIVE:    SUBJECTIVE STATEMENT:  01/29/24: I am feeling much better overall , less sore and stiff now, wold like some core ex training PERTINENT HISTORY: The pt is an 78 yo male presenting 8/30 with SpO2 89% on RA and SOB, wife also reports pt sliding off couch x2 in last day and unable to get back up. Work up revealed COVID +, Ddimer +, and mild troponin elevation. He was admitted from 10/26/23 to 10/29/23.  Referred to outpt PT to assist with his recovery of function  PAIN:  Are you having pain? No  PRECAUTIONS: Fall  RED FLAGS: None   WEIGHT BEARING RESTRICTIONS: No  FALLS:  Has patient fallen in last 6 months? No  LIVING ENVIRONMENT: Lives with: lives with their spouse Lives in: House/apartment Stairs: has interior stairs but doesn't use Has following equipment at home: None  OCCUPATION: retired  PLOF: Independent  PATIENT GOALS: The patient wants to have better stability  NEXT MD VISIT: December 2025  OBJECTIVE:  Note: Objective measures were completed at Evaluation unless otherwise noted.  DIAGNOSTIC FINDINGS: na   COGNITION: Overall cognitive status: Within functional limits for tasks assessed     SENSATION: WFL  EDEMA:  None noted  POSTURE: wide base of support noted in standing   LOWER EXTREMITY MNF:hmnddob wnl   LOWER EXTREMITY MMT:all MMT 4/5 except B plantarflexion 3+/5 R shoulder IR and ER 4/5 with shaking, tremulous movement pattern   FUNCTIONAL TESTS:  5 times sit to stand: 16.06 sec Berg 42/56 Gait speed, 3 M in 6.5 sec:  0.46 m/sec Gait with horizontal head turns, up /down turns, eyes closed, all wnl.  Gait backwards very slow,  unsteady  GAIT: Distance walked: in clnic up to 90 ' Assistive device utilized: None Level of assistance: Modified independence Comments: wide base of support, shortened stance time on L arms mid guard, very slow, decreased to no arm swing  TREATMENT DATE:  01/29/24: Nustep L5 x 6 min  Seated marching with blue theraband around thighs Seated chest press with black t band around back shoulders Supine bridging Supine dead bug  Side lying clam shell 15 x each In ll bars  On airex heel/toe rocks On airex forward taps to sea urchins alt  In ll bars 2# cuff wts for alt standing SLR, Alt hip abd 15 reps each In ll bars for lateral travelling side to side In ll bars for forward step ups with knee drivers, mass practice each leg Walking forward on airex balance beam in ll bars   01/22/24:  In ll bars on airex for heel/ toe rocks In ll bars marching on airex In ll bars alt toe taps to sea urchins from airex Nustep L 5 x 6 min Alt hip abd 2# cuff  Alt ham curls 2# cuff Lunges on compliant surface BOSU in ll bars  Forward step ups onto 4 block  Walking on airex balance bean in ll bars Side stepping on airex balance in ll bars   Leg press 20# 3 x 10 Standing feet together 3# alt arm swings Standing semi tandem 3 # alt arm swings    01/15/24: Nustep L 5 x 6 min In ll bars , balance and coordination activities: On airex heel/toe rocks On airex for alt toe taps to sea urchins In ll bars, alt 3 way hip, with 2 # cuff wts In ll bars for high marching, 2# cuff wts  In ll bars lateral stepping over airex balance beam to improve stride length Knee flexion B 25# 2 x 10 Knee ext 10# 3 x 10 B plantarflexor stretch with forefeet on 3 bar, 15 sec stretch , then B heel raises Sit to stand from hi lo table with feet on airex Semi tandem standing with B shoulder  abduction swings with 2# medibar, mass practice  01/08/24: Nustep  LL bars marching on airex Tandem standing 30 sec alt lead foot 3 way hip alt feet 10 x each 1 1/2 # Forward step ups with 1 1/2 # each leg onto 6 step  Standing feet together  In ll bars for alt toe taps to sea urchins B plantarflexor stretch on incline with Heel raises, 20 x Single leg stance 10sec each leg Knee flexion B 25# 2 x 10 Knee ext 10# 2 x 10 Sit to stand form hi lo mat feet on airex Side stepping over airex balance beam in ll bars mass practice  Walking 100' throwing and catching playground ball  Standing feet together, B shoulder flex,ext swings   01/01/24: -Nustep L5x10min -SL Stance >10s BLE -Marches on Airex -Lateral steps on balance beam -Tandem stance on balance beam -Step Ups 2x10 -STS on Airex 2x10   12/26/23: Nustep lvl 5 for 6 min Sit>Stand on Airex Pad 2 x 10 Seated cone taps with hands for vestibular upregulation (3 trials) Resisted Standing Marches with 5 second holds in // bars 2 x 8 each side Hip Abduction with resistance 2 x 8  Knee Ext 10# 2 x 10 Leg Curls 25# 2 x 10 Hip Flexor Stretch at Steps  12/18/23:  Nustep L 5 x 6 min In ll bars for semi tandem standing with lead foot on 2 step horizontal head turns focused on target  In ll bars for heel toe rocks on airex mass practice In ll bars for alt toe taps to cone positioned on airex Sit to stand from hi low  table, feet on airex to challenge balance Marching on airex Knee ext B 10 # 3 x 10  B knee flexion 25# 3 x 10  Seated alt punches with opposite marching, then in standing mass practice Standing with 2 # medibar, narrowed stance shoulder abd arm swings Standing 2 # medibar , narrowed stance B shoulder flexion fast   12/12/23: - Vestibular Screening: Convergence = Endorsed improvements in diplopia when moving closer Smooth pursuits = WFL VOR Cancellation = Normal HIT = R (+); L inconclusive d/t muscle guarding -  Nustep Lvl 5 for 6 min UE and LE - BERG: 49/56 - Gait Speed: 3.83 seconds or 1.57 m/s - SLS: RLE = 3 seconds; LLE = 10 seconds VOR x 1 (Horizontal and Vertical)  12/04/23: Gait outdoors 4 min Standing semi tandem , alt arm swings with 2# wts each hand 3 sec intervals alternating lead foot Hamstring curls B 25# 10 x 3 Knee ext B 10# 10 x 3 Standing semi tandem lead foot on 2 step, with horizontal head turns, 15 x each  Nustep level 5 x 6 min UE's and LE's Lateral travelling on 2' step mass practice Standing for 3 way hip, no wts today 12 reps each leg  Sit to stand with red mediball punches 10 x Standing on airex heel toe rocks, B UE support 10 x     12/02/23:  Nustep L 5 x 6 min UE and Les Standing for lat step overs, transitioning on and off airex Standing with 2# cuff wts B ankles for alt SLR, alt hip abd, alt hip ext In ll bars for walking on airex balance beam 6 laps Sit to stand from hi low mat with forward punches with 2 # mediball 10x Standing tandem 30 sec holds Hamstring curls 25#, 3 x 10  Knee ext B 10# 3 x10 Lat step ups 4 step x 15 reps each side Assessed gait speed , walked 100'    11/29/23:  In ll bars for lunges on compliant side of BOSU no UE  In ll bars for heel/toe rocks on airex In ll bars for side stepping length of bars, 4 laps, with 2# cuff weights B In ll bars for alt SLR 2# cuff wts ankles In ll bars for alt hip ext  Nustep L 5 x 6 min Hamstring curls B 20#, 10 x 2 B knee ext 5# 10 x 2 Gait on airex balance beam in ll bars, used hands for support High marches in ll bars with 5 to 7 sec holds Lateral step ups onto lower step, 4 10 each Alt forward toe taps to 6 step 20 reps  11/25/23:  Bike L 2 x 6 min  In ll bars, lunges on compliant side of BOSU 2 x 15 each leg In ll bars, heel/ toe raises on airex In ll bars traveling side to side on airex In ll bars for 3 way hip 2# cuff wts one set 10 each leg  In ll bars side stepping length of bars  2# cuff weights 5 laps In ll bars for semi tandem standing 30 sec holds  In ll bars for forward heel taps 15 reps each leg form 2 step Knee ext 5# 10 x   11/22/23 Bike L2 x65mins  On airex balance eyes open and closed Marching on airex Reaching for numbers on airex Cone taps on airex Tandem 30s  SLS in bars 5s at best Step ups 6 STS goal- 12s Shoulder ext 5#  2x10 Leg press 20# x10, 30# x10   11/20/23:  Sit to stand with red mediball punches 4 x 5 reps Forefeet on 3 bar for B heel raises 2 x 10 Nustep L5 x 6 min Hamstring curls 25# 10 x 2 B knee ext 5#, 2 x 10 Forward step ups onto 4 step B hand hold 10 x each  Balance training: In ll bars:  Airex static standing Airex with B arm swings Forward lunges on bosu compliant side Standing alt hip abd and alt standing SLR with 2 #cuff wts each leg Walking on airex balance beam,  all of balance training mass practice  11/13/23 NuStep L5x18mins  Leg ext 5# 2x10 HS curls 20# 2x10 Calf raises 2x10 Calf stretch on bar 20s x2 STS with chest press 2x10 Walking on beam   11/08/23 LAQ 3# 2x10 HS curls green 2x10 Ball squeeze 2x10 NuStep L5x13mins  In bars hip flexion and abd  On airex balance and then eyes closed  Taps 4  STS 2x10   PATIENT EDUCATION:  Education details: POC, goals  Person educated: Patient Education method: Explanation, Demonstration, Tactile cues, Verbal cues, and Handouts Education comprehension: verbalized understanding, returned demonstration, verbal cues required, tactile cues required, and needs further education  HOME EXERCISE PROGRAM: Access Code: [email or web address] URL: [email or web address] Date: 01/29/2024 Prepared by: Clifford [name]  Exercises - Bridge  - 1 x daily - 3 x weekly - 3 sets - 10 reps - Beginner Clam  - 1 x daily - 3 x weekly - 3 sets - 10 reps - Dead Bug  - 1 x daily - 3 x weekly - 3 sets - 10 reps - Seated March with Resistance  - 1 x daily - 3 x weekly - 3 sets - 10  reps   Access Code: [email or web address] URL: [email or web address] Date: 11/08/2023 Prepared by: Almetta Fam  Exercises - Heel Toe Raises with Counter Support  - 2 x daily - 7 x weekly - 2 sets - 10 reps - Standing Hip Abduction with Counter Support  - 2 x daily - 7 x weekly - 2 sets - 10 reps - Standing March with Counter Support  - 2 x daily - 7 x weekly - 2 sets - 10 reps - Sit to Stand  - 2 x daily - 7 x weekly - 2 sets - 10 reps -Tandem and SLS    ASSESSMENT:  CLINICAL IMPRESSION: Patient returned for PT intervention due to compromised endurance, balance. Added core strengthening and provided with additional home instruction in these ex per his request. He is progressing very well and near completion of his PT duration of care, is to attend for 2 more sessions so we will need to make sure his home routine is up to date for him.     Patient is a 78 y.o. male who was evaluated today by physical therapy evaluation due to a recent decline in his overall strength, stability, mobility, and hospitalization due to COVID.  Per he and his wife he was declining in stamina and function for 1 -2 months prior to this hospitalization.  He is concerned about losing his balance/ fall risk.  He does demonstrate risk for falls with BERG score of 42/56 and with very slow, guarded gait speed.  Has less stability with isolated weight bearing on L LE vs R.  Vestibular screening wnl.  Should benefit from physical therapy with focused guided progression with endurance, functional strength and balance training.  OBJECTIVE IMPAIRMENTS: decreased activity  tolerance, decreased balance, decreased endurance, decreased knowledge of condition, difficulty walking, decreased strength, and impaired perceived functional ability.   ACTIVITY LIMITATIONS: carrying, bending, standing, squatting, stairs, transfers, and locomotion level  PARTICIPATION LIMITATIONS: meal prep, cleaning, laundry, shopping, and community  activity  PERSONAL FACTORS: Age, Behavior pattern, Time since onset of injury/illness/exacerbation, and 1-2 comorbidities: DM are also affecting patient's functional outcome.   REHAB POTENTIAL: Good  CLINICAL DECISION MAKING: Evolving/moderate complexity  EVALUATION COMPLEXITY: Moderate   GOALS: Goals reviewed with patient? Yes  SHORT TERM GOALS: Target date: 2 weeks, 11/20/23 I HEP Baseline: Goal status: MET 11/22/23   LONG TERM GOALS: Target date: 8 weeks, 01/01/24, extended to 02/12/24  Berg improve from 42/56  to 45/56 or greater Baseline:  Goal status: INITIAL; MET 12/12/23  2.  Gait speed consistently greater than 0.8 m/sec Baseline:  Goal status: 0.51 m/sec; in progress  12/12/23  3.  5 x sit to stand 14.8 sec or less improved from 16.06 sec Baseline:  Goal status: 12s MET 11/22/23  4. Single leg stance >10s BLE  Baseline:   Goal status: progressing (RLE 3 seconds, LLE 10 seconds) 12/12/23; 3s BLE 01/01/24  5. improve to 800' or better  Baseline: 600;  Goal Status: Initial    PLAN:  PT FREQUENCY: 1x/week  PT DURATION: 8 weeks  PLANNED INTERVENTIONS: 97110-Therapeutic exercises, 97530- Therapeutic activity, [id number]- Neuromuscular re-education, 97535- Self Care, [id number]- Manual therapy, and [id number]- Gait training  PLAN FOR NEXT SESSION: cardiovascular equipment, bulk strengthening B LE's and Ue's , trunk, balance progression; vestibular , reassess , reassess gait speed    Clifford [name], PT, DPT, OCS 01/29/2024, 11:23 AM [facility] at [facility] [address] [facility] [place], [place], [address] Phone: [phone]   Fax:  [phone]  Patient Details  Name: Clifford [name] MRN: [id number] Date of Birth: [birth year] Referring Provider:  Darci Pore, *  Encounter Date: 01/29/2024   Clifford [name], PT 01/29/2024, 11:23 AM  [facility] at [facility] [address] [facility]. [place], [place], [address] Phone: [phone]   Fax:  [phone]

## 2024-02-04 NOTE — Therapy (Signed)
 OUTPATIENT PHYSICAL THERAPY LOWER EXTREMITY TREATMENT   Patient Name: Clifford [name] MRN: [id number] DOB:10-30-1945, 78 y.o., male Today's Date: 02/04/2024  END OF SESSION:          Past Medical History:  Diagnosis Date   CAD (coronary artery disease) 02/27/2003   a. Previous stents RCA and circ, Cath 2005 patient with 70% PDA  //  b. [place]  5/17 >> LHC oLCx 50, pLAD 50, mLAD 95, RPDA 80, normal LVEF >> s/p CABG   Cancer (HCC)    Carotid artery disease    a. Carotid US  5/17 bilat ICA 1-39%   Cataract    replace 11/2012   Diabetes mellitus    diet controlled   History of detached retina repair 11/06/2006   [facility]   History of echocardiogram    a. EF 55% to 60%. Wall motion was normal, Gr 1 DD, Trivial MR/TR   History of hematuria    History of prostate cancer 03/29/2005   s/p prostatectomy   Hyperlipidemia    Hypertension    Past Surgical History:  Procedure Laterality Date   ANGIOPLASTY     1995,2000,2002   CARDIAC CATHETERIZATION  07/26/2000   Initially the stenosis in the proximal to mid right coronary artery was estimated ar 95%. Following stenting, this improved to 0%. There was residual mild disease in the sostium of the proximal right coronary atrtery. Successful stentng of the proximal to mid right coronary artery with improvement in stent diameter narrowing from 95% to 0% Proc Date  07/29/2000   CARDIAC CATHETERIZATION N/A 07/04/2015   Procedure: Left Heart Cath and Coronary Angiography;  Surgeon: Candyce GORMAN Reek, MD;  Location: [facility] INVASIVE CV LAB;  Service: Cardiovascular;  Laterality: N/A;   CATARACT EXTRACTION W/ INTRAOCULAR LENS IMPLANT Bilateral OS Sept '14; OD Oct '14   Dr. camillo   COLONOSCOPY  04/12/2015   Dr.Gessner   CORONARY ARTERY BYPASS GRAFT N/A 07/08/2015   Procedure: CORONARY ARTERY BYPASS GRAFTING (CABG)x three using left internal mammary artery to left anterior decending artery, left greater saphenous vein grafts to diagonal and  posterolateral. Left leg greater saphenous leg vein via endoscope. ;  Surgeon: Dallas KATHEE Jude, MD;  Location: [facility] OR;  Service: Open Heart Surgery;  Laterality: N/A;   CYSTOSCOPY N/A 07/08/2015   Procedure: CYSTOSCOPY FLEXIBLE WITH BALLOON DILATION OF BLADDER NECK CONTRACTURE WITH DIFFICULT CATHETER PLACEMENT;  Surgeon: Gretel Ferrara, MD;  Location: [facility] OR;  Service: Urology;  Laterality: N/A;   EYE SURGERY  11/24/2012   left eye cataract   EYE SURGERY  12/22/2012   right eye cataract   HEMORRHOID SURGERY     I&D of thrombosed hemorrhoid   LIPOMA EXCISION     POLYPECTOMY     PROSTATECTOMY  03/29/2005   PTCA  02/27/2003   TEE WITHOUT CARDIOVERSION N/A 07/08/2015   Procedure: TRANSESOPHAGEAL ECHOCARDIOGRAM (TEE);  Surgeon: Dallas KATHEE Jude, MD;  Location: [facility] OR;  Service: Open Heart Surgery;  Laterality: N/A;   WRIST GANGLION EXCISION     Patient Active Problem List   Diagnosis Date Noted   COVID-19 10/27/2023   Pneumonia due to COVID-19 virus 10/27/2023   Acute hypoxic respiratory failure (HCC) 10/27/2023   Elevated troponin 10/27/2023   Lumbar pain 06/03/2023   Weakness of both legs 06/03/2023   Constipation 12/30/2020   CKD (chronic kidney disease) stage 3, GFR 30-59 ml/min (HCC) 08/01/2015   Carotid artery disease    Hyperlipidemia associated with type 2 diabetes mellitus (HCC) 07/03/2015  Hypertensive heart disease 07/03/2015   Routine health maintenance 12/11/2010   Diabetes mellitus type 2 with complications (HCC) 07/23/2007   Essential hypertension 03/07/2007   Coronary atherosclerosis 03/07/2007   PROSTATE CANCER, HX OF 03/07/2007    PCP: Rollene Norris, MD  REFERRING PROVIDER: Darci Pore, MD  REFERRING DIAG: dyspnea upon exertion  THERAPY DIAG:  No diagnosis found.  Rationale for Evaluation and Treatment: Rehabilitation  ONSET DATE: 10/26/23  SUBJECTIVE:    SUBJECTIVE STATEMENT:  01/29/24: I am feeling much better overall , less sore and  stiff now, wold like some core ex training PERTINENT HISTORY: The pt is an 78 yo male presenting 8/30 with SpO2 89% on RA and SOB, wife also reports pt sliding off couch x2 in last day and unable to get back up. Work up revealed COVID +, Ddimer +, and mild troponin elevation. He was admitted from 10/26/23 to 10/29/23.  Referred to outpt PT to assist with his recovery of function  PAIN:  Are you having pain? No  PRECAUTIONS: Fall  RED FLAGS: None   WEIGHT BEARING RESTRICTIONS: No  FALLS:  Has patient fallen in last 6 months? No  LIVING ENVIRONMENT: Lives with: lives with their spouse Lives in: House/apartment Stairs: has interior stairs but doesn't use Has following equipment at home: None  OCCUPATION: retired  PLOF: Independent  PATIENT GOALS: The patient wants to have better stability  NEXT MD VISIT: December 2025  OBJECTIVE:  Note: Objective measures were completed at Evaluation unless otherwise noted.  DIAGNOSTIC FINDINGS: na   COGNITION: Overall cognitive status: Within functional limits for tasks assessed     SENSATION: WFL  EDEMA:  None noted  POSTURE: wide base of support noted in standing   LOWER EXTREMITY MNF:hmnddob wnl   LOWER EXTREMITY MMT:all MMT 4/5 except B plantarflexion 3+/5 R shoulder IR and ER 4/5 with shaking, tremulous movement pattern   FUNCTIONAL TESTS:  5 times sit to stand: 16.06 sec Berg 42/56 Gait speed, 3 M in 6.5 sec:  0.46 m/sec Gait with horizontal head turns, up /down turns, eyes closed, all wnl.  Gait backwards very slow, unsteady  GAIT: Distance walked: in clnic up to 90 ' Assistive device utilized: None Level of assistance: Modified independence Comments: wide base of support, shortened stance time on L arms mid guard, very slow, decreased to no arm swing                                                                                                                                TREATMENT DATE:   02/05/24 NuSteo -  Shoulder ext HS curls Leg ext Step up from airex 6 Walking on beam  Tandem and SLS on beam STS on airex  01/29/24: Nustep L5 x 6 min  Seated marching with blue theraband around thighs Seated chest press with black t band around back shoulders Supine bridging Supine dead bug  Side lying clam shell 15 x each  In ll bars  On airex heel/toe rocks On airex forward taps to sea urchins alt  In ll bars 2# cuff wts for alt standing SLR, Alt hip abd 15 reps each In ll bars for lateral travelling side to side In ll bars for forward step ups with knee drivers, mass practice each leg Walking forward on airex balance beam in ll bars   01/22/24:  In ll bars on airex for heel/ toe rocks In ll bars marching on airex In ll bars alt toe taps to sea urchins from airex Nustep L 5 x 6 min Alt hip abd 2# cuff  Alt ham curls 2# cuff Lunges on compliant surface BOSU in ll bars  Forward step ups onto 4 block  Walking on airex balance bean in ll bars Side stepping on airex balance in ll bars   Leg press 20# 3 x 10 Standing feet together 3# alt arm swings Standing semi tandem 3 # alt arm swings    01/15/24: Nustep L 5 x 6 min In ll bars , balance and coordination activities: On airex heel/toe rocks On airex for alt toe taps to sea urchins In ll bars, alt 3 way hip, with 2 # cuff wts In ll bars for high marching, 2# cuff wts  In ll bars lateral stepping over airex balance beam to improve stride length Knee flexion B 25# 2 x 10 Knee ext 10# 3 x 10 B plantarflexor stretch with forefeet on 3 bar, 15 sec stretch , then B heel raises Sit to stand from hi lo table with feet on airex Semi tandem standing with B shoulder abduction swings with 2# medibar, mass practice  01/08/24: Nustep  LL bars marching on airex Tandem standing 30 sec alt lead foot 3 way hip alt feet 10 x each 1 1/2 # Forward step ups with 1 1/2 # each leg onto 6 step  Standing feet together  In  ll bars for alt toe taps to sea urchins B plantarflexor stretch on incline with Heel raises, 20 x Single leg stance 10sec each leg Knee flexion B 25# 2 x 10 Knee ext 10# 2 x 10 Sit to stand form hi lo mat feet on airex Side stepping over airex balance beam in ll bars mass practice  Walking 100' throwing and catching playground ball  Standing feet together, B shoulder flex,ext swings   01/01/24: -Nustep L5x27min -SL Stance >10s BLE -Marches on Airex -Lateral steps on balance beam -Tandem stance on balance beam -Step Ups 2x10 -STS on Airex 2x10   12/26/23: Nustep lvl 5 for 6 min Sit>Stand on Airex Pad 2 x 10 Seated cone taps with hands for vestibular upregulation (3 trials) Resisted Standing Marches with 5 second holds in // bars 2 x 8 each side Hip Abduction with resistance 2 x 8  Knee Ext 10# 2 x 10 Leg Curls 25# 2 x 10 Hip Flexor Stretch at Steps  12/18/23:  Nustep L 5 x 6 min In ll bars for semi tandem standing with lead foot on 2 step horizontal head turns focused on target  In ll bars for heel toe rocks on airex mass practice In ll bars for alt toe taps to cone positioned on airex Sit to stand from hi low table, feet on airex to challenge balance Marching on airex Knee ext B 10 # 3 x 10  B knee flexion 25# 3 x 10  Seated alt punches with opposite marching, then in standing mass practice Standing with  2 # medibar, narrowed stance shoulder abd arm swings Standing 2 # medibar , narrowed stance B shoulder flexion fast   12/12/23: - Vestibular Screening: Convergence = Endorsed improvements in diplopia when moving closer Smooth pursuits = WFL VOR Cancellation = Normal HIT = R (+); L inconclusive d/t muscle guarding - Nustep Lvl 5 for 6 min UE and LE - BERG: 49/56 - Gait Speed: 3.83 seconds or 1.57 m/s - SLS: RLE = 3 seconds; LLE = 10 seconds VOR x 1 (Horizontal and Vertical)  12/04/23: Gait outdoors 4 min Standing semi tandem , alt arm swings with 2# wts each hand  3 sec intervals alternating lead foot Hamstring curls B 25# 10 x 3 Knee ext B 10# 10 x 3 Standing semi tandem lead foot on 2 step, with horizontal head turns, 15 x each  Nustep level 5 x 6 min UE's and LE's Lateral travelling on 2' step mass practice Standing for 3 way hip, no wts today 12 reps each leg  Sit to stand with red mediball punches 10 x Standing on airex heel toe rocks, B UE support 10 x     12/02/23:  Nustep L 5 x 6 min UE and Les Standing for lat step overs, transitioning on and off airex Standing with 2# cuff wts B ankles for alt SLR, alt hip abd, alt hip ext In ll bars for walking on airex balance beam 6 laps Sit to stand from hi low mat with forward punches with 2 # mediball 10x Standing tandem 30 sec holds Hamstring curls 25#, 3 x 10  Knee ext B 10# 3 x10 Lat step ups 4 step x 15 reps each side Assessed gait speed , walked 100'    11/29/23:  In ll bars for lunges on compliant side of BOSU no UE  In ll bars for heel/toe rocks on airex In ll bars for side stepping length of bars, 4 laps, with 2# cuff weights B In ll bars for alt SLR 2# cuff wts ankles In ll bars for alt hip ext  Nustep L 5 x 6 min Hamstring curls B 20#, 10 x 2 B knee ext 5# 10 x 2 Gait on airex balance beam in ll bars, used hands for support High marches in ll bars with 5 to 7 sec holds Lateral step ups onto lower step, 4 10 each Alt forward toe taps to 6 step 20 reps  11/25/23:  Bike L 2 x 6 min  In ll bars, lunges on compliant side of BOSU 2 x 15 each leg In ll bars, heel/ toe raises on airex In ll bars traveling side to side on airex In ll bars for 3 way hip 2# cuff wts one set 10 each leg  In ll bars side stepping length of bars 2# cuff weights 5 laps In ll bars for semi tandem standing 30 sec holds  In ll bars for forward heel taps 15 reps each leg form 2 step Knee ext 5# 10 x   11/22/23 Bike L2 x21mins  On airex balance eyes open and closed Marching on airex Reaching for  numbers on airex Cone taps on airex Tandem 30s  SLS in bars 5s at best Step ups 6 STS goal- 12s Shoulder ext 5# 2x10 Leg press 20# x10, 30# x10   11/20/23:  Sit to stand with red mediball punches 4 x 5 reps Forefeet on 3 bar for B heel raises 2 x 10 Nustep L5 x 6 min Hamstring curls  25# 10 x 2 B knee ext 5#, 2 x 10 Forward step ups onto 4 step B hand hold 10 x each  Balance training: In ll bars:  Airex static standing Airex with B arm swings Forward lunges on bosu compliant side Standing alt hip abd and alt standing SLR with 2 #cuff wts each leg Walking on airex balance beam,  all of balance training mass practice  11/13/23 NuStep L5x26mins  Leg ext 5# 2x10 HS curls 20# 2x10 Calf raises 2x10 Calf stretch on bar 20s x2 STS with chest press 2x10 Walking on beam   11/08/23 LAQ 3# 2x10 HS curls green 2x10 Ball squeeze 2x10 NuStep L5x11mins  In bars hip flexion and abd  On airex balance and then eyes closed  Taps 4  STS 2x10   PATIENT EDUCATION:  Education details: POC, goals  Person educated: Patient Education method: Explanation, Demonstration, Tactile cues, Verbal cues, and Handouts Education comprehension: verbalized understanding, returned demonstration, verbal cues required, tactile cues required, and needs further education  HOME EXERCISE PROGRAM: Access Code: [email or web address] URL: [email or web address] Date: 01/29/2024 Prepared by: Greig Credit  Exercises - Bridge  - 1 x daily - 3 x weekly - 3 sets - 10 reps - Beginner Clam  - 1 x daily - 3 x weekly - 3 sets - 10 reps - Dead Bug  - 1 x daily - 3 x weekly - 3 sets - 10 reps - Seated March with Resistance  - 1 x daily - 3 x weekly - 3 sets - 10 reps   Access Code: [email or web address] URL: [email or web address] Date: 11/08/2023 Prepared by: Almetta Fam  Exercises - Heel Toe Raises with Counter Support  - 2 x daily - 7 x weekly - 2 sets - 10 reps - Standing Hip Abduction with Counter  Support  - 2 x daily - 7 x weekly - 2 sets - 10 reps - Standing March with Counter Support  - 2 x daily - 7 x weekly - 2 sets - 10 reps - Sit to Stand  - 2 x daily - 7 x weekly - 2 sets - 10 reps -Tandem and SLS    ASSESSMENT:  CLINICAL IMPRESSION: Patient returned for PT intervention due to compromised endurance, balance. Added core strengthening and provided with additional home instruction in these ex per his request. He is progressing very well and near completion of his PT duration of care, is to attend for 2 more sessions so we will need to make sure his home routine is up to date for him.     Patient is a 78 y.o. male who was evaluated today by physical therapy evaluation due to a recent decline in his overall strength, stability, mobility, and hospitalization due to COVID.  Per he and his wife he was declining in stamina and function for 1 -2 months prior to this hospitalization.  He is concerned about losing his balance/ fall risk.  He does demonstrate risk for falls with BERG score of 42/56 and with very slow, guarded gait speed.  Has less stability with isolated weight bearing on L LE vs R.  Vestibular screening wnl.  Should benefit from physical therapy with focused guided progression with endurance, functional strength and balance training.  OBJECTIVE IMPAIRMENTS: decreased activity tolerance, decreased balance, decreased endurance, decreased knowledge of condition, difficulty walking, decreased strength, and impaired perceived functional ability.   ACTIVITY LIMITATIONS: carrying, bending, standing, squatting, stairs, transfers, and locomotion level  PARTICIPATION LIMITATIONS: meal prep, cleaning, laundry, shopping, and  community activity  PERSONAL FACTORS: Age, Behavior pattern, Time since onset of injury/illness/exacerbation, and 1-2 comorbidities: DM are also affecting patient's functional outcome.   REHAB POTENTIAL: Good  CLINICAL DECISION MAKING: Evolving/moderate  complexity  EVALUATION COMPLEXITY: Moderate   GOALS: Goals reviewed with patient? Yes  SHORT TERM GOALS: Target date: 2 weeks, 11/20/23 I HEP Baseline: Goal status: MET 11/22/23   LONG TERM GOALS: Target date: 8 weeks, 01/01/24, extended to 02/12/24  Berg improve from 42/56  to 45/56 or greater Baseline:  Goal status: INITIAL; MET 12/12/23  2.  Gait speed consistently greater than 0.8 m/sec Baseline:  Goal status: 0.51 m/sec; in progress  12/12/23  3.  5 x sit to stand 14.8 sec or less improved from 16.06 sec Baseline:  Goal status: 12s MET 11/22/23  4. Single leg stance >10s BLE  Baseline:   Goal status: progressing (RLE 3 seconds, LLE 10 seconds) 12/12/23; 3s BLE 01/01/24  5. improve to 800' or better  Baseline: 600;  Goal Status: Initial    PLAN:  PT FREQUENCY: 1x/week  PT DURATION: 8 weeks  PLANNED INTERVENTIONS: 97110-Therapeutic exercises, 97530- Therapeutic activity, 97112- Neuromuscular re-education, 97535- Self Care, 02859- Manual therapy, and 97116- Gait training  PLAN FOR NEXT SESSION: cardiovascular equipment, bulk strengthening B LE's and Ue's , trunk, balance progression; vestibular , reassess , reassess gait speed    Almetta Fam, PT, DPT, OCS 02/04/2024, 10:37 AM [facility] at [facility] [address] [facility] [place], [place], [address] Phone: [phone]   Fax:  [phone]  Patient Details  Name: CARTER KASSEL MRN: [id number] Date of Birth: 12/07/1945 Referring Provider:  Darci Pore, *  Encounter Date: 02/05/2024   Almetta Fam, PT 02/04/2024, 10:37 AM  [facility] at [facility] [address] [facility] [place], [place], [address] Phone: [phone]   Fax:  [phone]

## 2024-02-05 ENCOUNTER — Ambulatory Visit

## 2024-02-05 DIAGNOSIS — R262 Difficulty in walking, not elsewhere classified: Secondary | ICD-10-CM

## 2024-02-05 DIAGNOSIS — M6281 Muscle weakness (generalized): Secondary | ICD-10-CM | POA: Diagnosis not present

## 2024-02-11 ENCOUNTER — Encounter: Payer: Self-pay | Admitting: Physical Therapy

## 2024-02-11 ENCOUNTER — Ambulatory Visit

## 2024-02-11 DIAGNOSIS — M6281 Muscle weakness (generalized): Secondary | ICD-10-CM | POA: Diagnosis not present

## 2024-02-11 DIAGNOSIS — R262 Difficulty in walking, not elsewhere classified: Secondary | ICD-10-CM

## 2024-02-11 NOTE — Therapy (Signed)
 OUTPATIENT PHYSICAL THERAPY LOWER EXTREMITY TREATMENT   Patient Name: Clifford [name] MRN: [id number] DOB:01-23-1946, 78 y.o., male Today's Date: 02/11/2024  END OF SESSION:  PT End of Session - 02/11/24 1145     Visit Number 19    Date for Recertification  02/12/24    PT Start Time 1145    PT Stop Time 1230    PT Time Calculation (min) 45 min    Activity Tolerance Patient tolerated treatment well    Behavior During Therapy [facility] for tasks assessed/performed                 Past Medical History:  Diagnosis Date   CAD (coronary artery disease) 02/27/2003   a. Previous stents RCA and circ, Cath 2005 patient with 70% PDA  //  b. [place]  5/17 >> LHC oLCx 50, pLAD 50, mLAD 95, RPDA 80, normal LVEF >> s/p CABG   Cancer (HCC)    Carotid artery disease    a. Carotid US  5/17 bilat ICA 1-39%   Cataract    replace 11/2012   Diabetes mellitus    diet controlled   History of detached retina repair 11/06/2006   [facility]   History of echocardiogram    a. EF 55% to 60%. Wall motion was normal, Gr 1 DD, Trivial MR/TR   History of hematuria    History of prostate cancer 03/29/2005   s/p prostatectomy   Hyperlipidemia    Hypertension    Past Surgical History:  Procedure Laterality Date   ANGIOPLASTY     1995,2000,2002   CARDIAC CATHETERIZATION  07/26/2000   Initially the stenosis in the proximal to mid right coronary artery was estimated ar 95%. Following stenting, this improved to 0%. There was residual mild disease in the sostium of the proximal right coronary atrtery. Successful stentng of the proximal to mid right coronary artery with improvement in stent diameter narrowing from 95% to 0% Proc Date  07/29/2000   CARDIAC CATHETERIZATION N/A 07/04/2015   Procedure: Left Heart Cath and Coronary Angiography;  Surgeon: Candyce GORMAN Reek, MD;  Location: [facility] INVASIVE CV LAB;  Service: Cardiovascular;  Laterality: N/A;   CATARACT EXTRACTION W/ INTRAOCULAR LENS IMPLANT Bilateral OS  Sept '14; OD Oct '14   Dr. camillo   COLONOSCOPY  04/12/2015   Dr.Gessner   CORONARY ARTERY BYPASS GRAFT N/A 07/08/2015   Procedure: CORONARY ARTERY BYPASS GRAFTING (CABG)x three using left internal mammary artery to left anterior decending artery, left greater saphenous vein grafts to diagonal and posterolateral. Left leg greater saphenous leg vein via endoscope. ;  Surgeon: Dallas KATHEE Jude, MD;  Location: [facility] OR;  Service: Open Heart Surgery;  Laterality: N/A;   CYSTOSCOPY N/A 07/08/2015   Procedure: CYSTOSCOPY FLEXIBLE WITH BALLOON DILATION OF BLADDER NECK CONTRACTURE WITH DIFFICULT CATHETER PLACEMENT;  Surgeon: Gretel Ferrara, MD;  Location: [facility] OR;  Service: Urology;  Laterality: N/A;   EYE SURGERY  11/24/2012   left eye cataract   EYE SURGERY  12/22/2012   right eye cataract   HEMORRHOID SURGERY     I&D of thrombosed hemorrhoid   LIPOMA EXCISION     POLYPECTOMY     PROSTATECTOMY  03/29/2005   PTCA  02/27/2003   TEE WITHOUT CARDIOVERSION N/A 07/08/2015   Procedure: TRANSESOPHAGEAL ECHOCARDIOGRAM (TEE);  Surgeon: Dallas KATHEE Jude, MD;  Location: [facility] OR;  Service: Open Heart Surgery;  Laterality: N/A;   WRIST GANGLION EXCISION     Patient Active Problem List   Diagnosis Date Noted  COVID-19 10/27/2023   Pneumonia due to COVID-19 virus 10/27/2023   Acute hypoxic respiratory failure (HCC) 10/27/2023   Elevated troponin 10/27/2023   Lumbar pain 06/03/2023   Weakness of both legs 06/03/2023   Constipation 12/30/2020   CKD (chronic kidney disease) stage 3, GFR 30-59 ml/min (HCC) 08/01/2015   Carotid artery disease    Hyperlipidemia associated with type 2 diabetes mellitus (HCC) 07/03/2015   Hypertensive heart disease 07/03/2015   Routine health maintenance 12/11/2010   Diabetes mellitus type 2 with complications (HCC) 07/23/2007   Essential hypertension 03/07/2007   Coronary atherosclerosis 03/07/2007   PROSTATE CANCER, HX OF 03/07/2007    PCP: Rollene Norris,  MD  REFERRING PROVIDER: Darci Pore, MD  REFERRING DIAG: dyspnea upon exertion  THERAPY DIAG:  Muscle weakness (generalized)  Difficulty in walking, not elsewhere classified  Rationale for Evaluation and Treatment: Rehabilitation  ONSET DATE: 10/26/23  SUBJECTIVE:    SUBJECTIVE STATEMENT:  Im a little weak I guess its the cold weather   PERTINENT HISTORY: The pt is an 78 yo male presenting 8/30 with SpO2 89% on RA and SOB, wife also reports pt sliding off couch x2 in last day and unable to get back up. Work up revealed COVID +, Ddimer +, and mild troponin elevation. He was admitted from 10/26/23 to 10/29/23.  Referred to outpt PT to assist with his recovery of function  PAIN:  Are you having pain? No  PRECAUTIONS: Fall  RED FLAGS: None   WEIGHT BEARING RESTRICTIONS: No  FALLS:  Has patient fallen in last 6 months? No  LIVING ENVIRONMENT: Lives with: lives with their spouse Lives in: House/apartment Stairs: has interior stairs but doesn't use Has following equipment at home: None  OCCUPATION: retired  PLOF: Independent  PATIENT GOALS: The patient wants to have better stability  NEXT MD VISIT: December 2025  OBJECTIVE:  Note: Objective measures were completed at Evaluation unless otherwise noted.  DIAGNOSTIC FINDINGS: na   COGNITION: Overall cognitive status: Within functional limits for tasks assessed     SENSATION: WFL  EDEMA:  None noted  POSTURE: wide base of support noted in standing   LOWER EXTREMITY MNF:hmnddob wnl   LOWER EXTREMITY MMT:all MMT 4/5 except B plantarflexion 3+/5 R shoulder IR and ER 4/5 with shaking, tremulous movement pattern   FUNCTIONAL TESTS:  5 times sit to stand: 16.06 sec Berg 42/56 Gait speed, 3 M in 6.5 sec:  0.46 m/sec Gait with horizontal head turns, up /down turns, eyes closed, all wnl.  Gait backwards very slow, unsteady  GAIT: Distance walked: in clnic up to 90 ' Assistive device utilized:  None Level of assistance: Modified independence Comments: wide base of support, shortened stance time on L arms mid guard, very slow, decreased to no arm swing  TREATMENT DATE:  02/11/24 Sit to stand on airex 2x10  NuStep L5x72mins  6in step ups from airex x10 each HS curls 25# 2x12 Leg ext 10# 2x10 6in lateral step ups min assist  Side step on balance bean in // bars SLS in // bars  02/05/24 NuStep L5x31mins  - 661ft  HS curls 25# 2x10 Leg ext 10# 2x10 STS on airex 2x10 Step up from airex 6 Walking on beam  Tandem and SLS on beam    01/29/24: Nustep L5 x 6 min  Seated marching with blue theraband around thighs Seated chest press with black t band around back shoulders Supine bridging Supine dead bug  Side lying clam shell 15 x each In ll bars  On airex heel/toe rocks On airex forward taps to sea urchins alt  In ll bars 2# cuff wts for alt standing SLR, Alt hip abd 15 reps each In ll bars for lateral travelling side to side In ll bars for forward step ups with knee drivers, mass practice each leg Walking forward on airex balance beam in ll bars   01/22/24:  In ll bars on airex for heel/ toe rocks In ll bars marching on airex In ll bars alt toe taps to sea urchins from airex Nustep L 5 x 6 min Alt hip abd 2# cuff  Alt ham curls 2# cuff Lunges on compliant surface BOSU in ll bars  Forward step ups onto 4 block  Walking on airex balance bean in ll bars Side stepping on airex balance in ll bars   Leg press 20# 3 x 10 Standing feet together 3# alt arm swings Standing semi tandem 3 # alt arm swings    01/15/24: Nustep L 5 x 6 min In ll bars , balance and coordination activities: On airex heel/toe rocks On airex for alt toe taps to sea urchins In ll bars, alt 3 way hip, with 2 # cuff wts In ll bars for high marching, 2# cuff wts   In ll bars lateral stepping over airex balance beam to improve stride length Knee flexion B 25# 2 x 10 Knee ext 10# 3 x 10 B plantarflexor stretch with forefeet on 3 bar, 15 sec stretch , then B heel raises Sit to stand from hi lo table with feet on airex Semi tandem standing with B shoulder abduction swings with 2# medibar, mass practice  01/08/24: Nustep  LL bars marching on airex Tandem standing 30 sec alt lead foot 3 way hip alt feet 10 x each 1 1/2 # Forward step ups with 1 1/2 # each leg onto 6 step  Standing feet together  In ll bars for alt toe taps to sea urchins B plantarflexor stretch on incline with Heel raises, 20 x Single leg stance 10sec each leg Knee flexion B 25# 2 x 10 Knee ext 10# 2 x 10 Sit to stand form hi lo mat feet on airex Side stepping over airex balance beam in ll bars mass practice  Walking 100' throwing and catching playground ball  Standing feet together, B shoulder flex,ext swings   01/01/24: -Nustep L5x19min -SL Stance >10s BLE -Marches on Airex -Lateral steps on balance beam -Tandem stance on balance beam -Step Ups 2x10 -STS on Airex 2x10   12/26/23: Nustep lvl 5 for 6 min Sit>Stand on Airex Pad 2 x 10 Seated cone taps with hands for vestibular upregulation (3 trials) Resisted Standing Marches with 5 second holds in // bars 2 x 8 each side Hip  Abduction with resistance 2 x 8  Knee Ext 10# 2 x 10 Leg Curls 25# 2 x 10 Hip Flexor Stretch at Steps  12/18/23:  Nustep L 5 x 6 min In ll bars for semi tandem standing with lead foot on 2 step horizontal head turns focused on target  In ll bars for heel toe rocks on airex mass practice In ll bars for alt toe taps to cone positioned on airex Sit to stand from hi low table, feet on airex to challenge balance Marching on airex Knee ext B 10 # 3 x 10  B knee flexion 25# 3 x 10  Seated alt punches with opposite marching, then in standing mass practice Standing with 2 # medibar, narrowed stance  shoulder abd arm swings Standing 2 # medibar , narrowed stance B shoulder flexion fast   12/12/23: - Vestibular Screening: Convergence = Endorsed improvements in diplopia when moving closer Smooth pursuits = WFL VOR Cancellation = Normal HIT = R (+); L inconclusive d/t muscle guarding - Nustep Lvl 5 for 6 min UE and LE - BERG: 49/56 - Gait Speed: 3.83 seconds or 1.57 m/s - SLS: RLE = 3 seconds; LLE = 10 seconds VOR x 1 (Horizontal and Vertical)  12/04/23: Gait outdoors 4 min Standing semi tandem , alt arm swings with 2# wts each hand 3 sec intervals alternating lead foot Hamstring curls B 25# 10 x 3 Knee ext B 10# 10 x 3 Standing semi tandem lead foot on 2 step, with horizontal head turns, 15 x each  Nustep level 5 x 6 min UE's and LE's Lateral travelling on 2' step mass practice Standing for 3 way hip, no wts today 12 reps each leg  Sit to stand with red mediball punches 10 x Standing on airex heel toe rocks, B UE support 10 x     12/02/23:  Nustep L 5 x 6 min UE and Les Standing for lat step overs, transitioning on and off airex Standing with 2# cuff wts B ankles for alt SLR, alt hip abd, alt hip ext In ll bars for walking on airex balance beam 6 laps Sit to stand from hi low mat with forward punches with 2 # mediball 10x Standing tandem 30 sec holds Hamstring curls 25#, 3 x 10  Knee ext B 10# 3 x10 Lat step ups 4 step x 15 reps each side Assessed gait speed , walked 100'    11/29/23:  In ll bars for lunges on compliant side of BOSU no UE  In ll bars for heel/toe rocks on airex In ll bars for side stepping length of bars, 4 laps, with 2# cuff weights B In ll bars for alt SLR 2# cuff wts ankles In ll bars for alt hip ext  Nustep L 5 x 6 min Hamstring curls B 20#, 10 x 2 B knee ext 5# 10 x 2 Gait on airex balance beam in ll bars, used hands for support High marches in ll bars with 5 to 7 sec holds Lateral step ups onto lower step, 4 10 each Alt forward toe  taps to 6 step 20 reps  11/25/23:  Bike L 2 x 6 min  In ll bars, lunges on compliant side of BOSU 2 x 15 each leg In ll bars, heel/ toe raises on airex In ll bars traveling side to side on airex In ll bars for 3 way hip 2# cuff wts one set 10 each leg  In ll bars side stepping  length of bars 2# cuff weights 5 laps In ll bars for semi tandem standing 30 sec holds  In ll bars for forward heel taps 15 reps each leg form 2 step Knee ext 5# 10 x   11/22/23 Bike L2 x36mins  On airex balance eyes open and closed Marching on airex Reaching for numbers on airex Cone taps on airex Tandem 30s  SLS in bars 5s at best Step ups 6 STS goal- 12s Shoulder ext 5# 2x10 Leg press 20# x10, 30# x10   11/20/23:  Sit to stand with red mediball punches 4 x 5 reps Forefeet on 3 bar for B heel raises 2 x 10 Nustep L5 x 6 min Hamstring curls 25# 10 x 2 B knee ext 5#, 2 x 10 Forward step ups onto 4 step B hand hold 10 x each  Balance training: In ll bars:  Airex static standing Airex with B arm swings Forward lunges on bosu compliant side Standing alt hip abd and alt standing SLR with 2 #cuff wts each leg Walking on airex balance beam,  all of balance training mass practice  11/13/23 NuStep L5x61mins  Leg ext 5# 2x10 HS curls 20# 2x10 Calf raises 2x10 Calf stretch on bar 20s x2 STS with chest press 2x10 Walking on beam   11/08/23 LAQ 3# 2x10 HS curls green 2x10 Ball squeeze 2x10 NuStep L5x32mins  In bars hip flexion and abd  On airex balance and then eyes closed  Taps 4  STS 2x10   PATIENT EDUCATION:  Education details: POC, goals  Person educated: Patient Education method: Explanation, Demonstration, Tactile cues, Verbal cues, and Handouts Education comprehension: verbalized understanding, returned demonstration, verbal cues required, tactile cues required, and needs further education  HOME EXERCISE PROGRAM: Access Code: [email or web address] URL:  [email or web address] Date: 01/29/2024 Prepared by: Greig Credit  Exercises - Bridge  - 1 x daily - 3 x weekly - 3 sets - 10 reps - Beginner Clam  - 1 x daily - 3 x weekly - 3 sets - 10 reps - Dead Bug  - 1 x daily - 3 x weekly - 3 sets - 10 reps - Seated March with Resistance  - 1 x daily - 3 x weekly - 3 sets - 10 reps   Access Code: [email or web address] URL: [email or web address] Date: 11/08/2023 Prepared by: Almetta Fam  Exercises - Heel Toe Raises with Counter Support  - 2 x daily - 7 x weekly - 2 sets - 10 reps - Standing Hip Abduction with Counter Support  - 2 x daily - 7 x weekly - 2 sets - 10 reps - Standing March with Counter Support  - 2 x daily - 7 x weekly - 2 sets - 10 reps - Sit to Stand  - 2 x daily - 7 x weekly - 2 sets - 10 reps -Tandem and SLS    ASSESSMENT:  CLINICAL IMPRESSION: Patient returned for PT intervention due to compromised endurance, balance. He needs cues to clear his feet when walking and with step ups, as he tends to shuffle hit feet. Instability noted today with step ups, side steps, an SLS. Min assist needed at times with lateral step ups.  SBA to CGA required with SLS and side steps on airex. Compliance reported with hep. Pt is pleased with his current functional status.    Patient is a 78 y.o. male who was evaluated today by physical therapy evaluation due to a recent decline in his overall strength, stability, mobility, and hospitalization  due to COVID.  Per he and his wife he was declining in stamina and function for 1 -2 months prior to this hospitalization.  He is concerned about losing his balance/ fall risk.  He does demonstrate risk for falls with BERG score of 42/56 and with very slow, guarded gait speed.  Has less stability with isolated weight bearing on L LE vs R.  Vestibular screening wnl.  Should benefit from physical therapy with focused guided progression with endurance, functional strength and balance  training.  OBJECTIVE IMPAIRMENTS: decreased activity tolerance, decreased balance, decreased endurance, decreased knowledge of condition, difficulty walking, decreased strength, and impaired perceived functional ability.   ACTIVITY LIMITATIONS: carrying, bending, standing, squatting, stairs, transfers, and locomotion level  PARTICIPATION LIMITATIONS: meal prep, cleaning, laundry, shopping, and community activity  PERSONAL FACTORS: Age, Behavior pattern, Time since onset of injury/illness/exacerbation, and 1-2 comorbidities: DM are also affecting patient's functional outcome.   REHAB POTENTIAL: Good  CLINICAL DECISION MAKING: Evolving/moderate complexity  EVALUATION COMPLEXITY: Moderate   GOALS: Goals reviewed with patient? Yes  SHORT TERM GOALS: Target date: 2 weeks, 11/20/23 I HEP Baseline: Goal status: MET 11/22/23   LONG TERM GOALS: Target date: 8 weeks, 01/01/24, extended to 02/12/24  Berg improve from 42/56  to 45/56 or greater Baseline:  Goal status: INITIAL; MET 12/12/23  2.  Gait speed consistently greater than 0.8 m/sec Baseline:  Goal status: 0.51 m/sec; in progress  12/12/23  3.  5 x sit to stand 14.8 sec or less improved from 16.06 sec Baseline:  Goal status: 12s MET 11/22/23  4. Single leg stance >10s BLE  Baseline:   Goal status: progressing (RLE 3 seconds, LLE 10 seconds) 12/12/23; 3s BLE 01/01/24  5. improve to 800' or better  Baseline: 600;  Goal Status: 6103ft ongoing 02/05/24    PLAN:  PT FREQUENCY: 1x/week  PT DURATION: 8 weeks  PLANNED INTERVENTIONS: 97110-Therapeutic exercises, 97530- Therapeutic activity, 97112- Neuromuscular re-education, 97535- Self Care, 02859- Manual therapy, and 97116- Gait training  PLAN FOR NEXT SESSION: D/C PT  PHYSICAL THERAPY DISCHARGE SUMMARY  Visits from Start of Care: 19  Patient agrees to discharge. Patient goals were partially met. Patient is being discharged due to being pleased with the current  functional level.   Almetta Fam, PT, DPT Tanda KANDICE Sorrow, PTA,

## 2024-02-17 ENCOUNTER — Encounter: Admitting: Internal Medicine

## 2024-02-24 ENCOUNTER — Encounter: Payer: Self-pay | Admitting: Internal Medicine

## 2024-02-24 ENCOUNTER — Ambulatory Visit: Admitting: Internal Medicine

## 2024-02-24 VITALS — BP 150/80 | HR 96 | Temp 97.8°F | Ht 62.0 in | Wt 176.0 lb

## 2024-02-24 DIAGNOSIS — E1169 Type 2 diabetes mellitus with other specified complication: Secondary | ICD-10-CM | POA: Diagnosis not present

## 2024-02-24 DIAGNOSIS — E118 Type 2 diabetes mellitus with unspecified complications: Secondary | ICD-10-CM | POA: Diagnosis not present

## 2024-02-24 DIAGNOSIS — Z0000 Encounter for general adult medical examination without abnormal findings: Secondary | ICD-10-CM

## 2024-02-24 DIAGNOSIS — I10 Essential (primary) hypertension: Secondary | ICD-10-CM | POA: Diagnosis not present

## 2024-02-24 DIAGNOSIS — Z8546 Personal history of malignant neoplasm of prostate: Secondary | ICD-10-CM | POA: Diagnosis not present

## 2024-02-24 DIAGNOSIS — E785 Hyperlipidemia, unspecified: Secondary | ICD-10-CM | POA: Diagnosis not present

## 2024-02-24 DIAGNOSIS — N1831 Chronic kidney disease, stage 3a: Secondary | ICD-10-CM | POA: Diagnosis not present

## 2024-02-24 LAB — COMPREHENSIVE METABOLIC PANEL WITH GFR
ALT: 20 U/L (ref 3–53)
AST: 18 U/L (ref 5–37)
Albumin: 4.6 g/dL (ref 3.5–5.2)
Alkaline Phosphatase: 50 U/L (ref 39–117)
BUN: 21 mg/dL (ref 6–23)
CO2: 28 meq/L (ref 19–32)
Calcium: 9.8 mg/dL (ref 8.4–10.5)
Chloride: 102 meq/L (ref 96–112)
Creatinine, Ser: 1.47 mg/dL (ref 0.40–1.50)
GFR: 44.71 mL/min — ABNORMAL LOW
Glucose, Bld: 185 mg/dL — ABNORMAL HIGH (ref 70–99)
Potassium: 4.5 meq/L (ref 3.5–5.1)
Sodium: 140 meq/L (ref 135–145)
Total Bilirubin: 0.8 mg/dL (ref 0.2–1.2)
Total Protein: 7 g/dL (ref 6.0–8.3)

## 2024-02-24 LAB — CBC
HCT: 47.2 % (ref 39.0–52.0)
Hemoglobin: 16.2 g/dL (ref 13.0–17.0)
MCHC: 34.4 g/dL (ref 30.0–36.0)
MCV: 90.8 fl (ref 78.0–100.0)
Platelets: 224 K/uL (ref 150.0–400.0)
RBC: 5.2 Mil/uL (ref 4.22–5.81)
RDW: 12.7 % (ref 11.5–15.5)
WBC: 9.4 K/uL (ref 4.0–10.5)

## 2024-02-24 LAB — LIPID PANEL
Cholesterol: 129 mg/dL (ref 28–200)
HDL: 37.6 mg/dL — ABNORMAL LOW
LDL Cholesterol: 53 mg/dL (ref 10–99)
NonHDL: 91.56
Total CHOL/HDL Ratio: 3
Triglycerides: 192 mg/dL — ABNORMAL HIGH (ref 10.0–149.0)
VLDL: 38.4 mg/dL (ref 0.0–40.0)

## 2024-02-24 LAB — HEMOGLOBIN A1C: Hgb A1c MFr Bld: 9.7 % — ABNORMAL HIGH (ref 4.6–6.5)

## 2024-02-24 LAB — PSA: PSA: 0.02 ng/mL — ABNORMAL LOW (ref 0.10–4.00)

## 2024-02-24 LAB — MICROALBUMIN / CREATININE URINE RATIO
Creatinine,U: 242.4 mg/dL
Microalb Creat Ratio: 24.2 mg/g (ref 0.0–30.0)
Microalb, Ur: 5.9 mg/dL — ABNORMAL HIGH (ref 0.7–1.9)

## 2024-02-24 NOTE — Assessment & Plan Note (Signed)
 Checking PSA. No new symptoms.

## 2024-02-24 NOTE — Assessment & Plan Note (Signed)
 Checking lipid panel and adjust as needed.

## 2024-02-24 NOTE — Assessment & Plan Note (Signed)
 BP mildly elevated today without meds this morning. Previously at goal leave regimen same. Checking CMP and adjust as needed.

## 2024-02-24 NOTE — Assessment & Plan Note (Signed)
 Checking CMP for stability.

## 2024-02-24 NOTE — Progress Notes (Signed)
" ° °  Subjective:   Patient ID: Clifford [name], male    DOB: [birth year], 78 y.o.   MRN: [id number]  The patient is here for physical. Pertinent topics discussed: Discussed the use of AI scribe software for clinical note transcription with the patient, who gave verbal consent to proceed.  History of Present Illness Clifford [name] is an 78 year old male who presents with persistent wobbliness and constipation.  He experiences persistent wobbliness and weakness, particularly if he does not perform daily exercises learned during rehabilitation, which he completed in mid-November. Skipping these exercises results in increased unsteadiness the following day. Despite the exercises, he continues to feel weak and tired. He needs to balance time spent sitting, standing, and walking to avoid stiffness and weakness. His leg muscles sometimes feel unresponsive, requiring conscious effort to move them. He has noticed changes in his walking pattern over the last few years, moving from a heel-toe pattern to a flat-footed walk. He is aware of a disconnect in spatial awareness, affecting his ability to judge distances accurately.  He has experienced significant constipation throughout the year, which he associates with starting glimepiride . Despite eating the right foods and staying active, the constipation persists. He recalls a similar adjustment period when he first started lisinopril .  He occasionally feels shaky around 3 PM, but is unsure if this is due to low blood sugar or high blood pressure. He is drinking more water, which increases his incontinence. He takes his medication at 8 AM and 8 PM.  No chest pain or breathing troubles, but he mentions hearing a 'wheeze or whistle' at night, which resolves with throat clearing or ear unblocking. He also describes hearing a rumbling sound internally, which his wife does not hear.  PMH, [place], social history reviewed and updated  Review of Systems  Constitutional:  Negative.   HENT: Negative.    Eyes: Negative.   Respiratory:  Negative for cough, chest tightness and shortness of breath.   Cardiovascular:  Negative for chest pain, palpitations and leg swelling.  Gastrointestinal:  Negative for abdominal distention, abdominal pain, constipation, diarrhea, nausea and vomiting.  Musculoskeletal:  Positive for arthralgias, gait problem and myalgias.  Skin: Negative.   Psychiatric/Behavioral: Negative.      Objective:  Physical Exam Constitutional:      Appearance: He is well-developed.  HENT:     Head: Normocephalic and atraumatic.  Cardiovascular:     Rate and Rhythm: Normal rate and regular rhythm.  Pulmonary:     Effort: Pulmonary effort is normal. No respiratory distress.     Breath sounds: Normal breath sounds. No wheezing or rales.  Abdominal:     General: Bowel sounds are normal. There is no distension.     Palpations: Abdomen is soft.     Tenderness: There is no abdominal tenderness.  Musculoskeletal:     Cervical back: Normal range of motion.  Skin:    General: Skin is warm and dry.     Comments: Foot exam done  Neurological:     Mental Status: He is alert and oriented to person, place, and time.     Coordination: Coordination normal.     Vitals:   02/24/24 0803 02/24/24 0809  BP: (!) 150/80 (!) 150/80  Pulse: 96   Temp: 97.8 F (36.6 C)   TempSrc: Oral   SpO2: 96%   Weight: 176 lb (79.8 kg)   Height: 5' 2 (1.575 m)     Assessment & Plan:   "

## 2024-02-24 NOTE — Assessment & Plan Note (Signed)
 Checking HgA1c, UACR, lipid panel and CMP. Foot exam done and eye exam up to date. Adjust amaryl  as needed on statin and ACE-I.

## 2024-02-24 NOTE — Assessment & Plan Note (Signed)
Flu shot up to date. Pneumonia complete. Shingrix due at pharmacy. Tetanus up to date. Colonoscopy aged out. Counseled about sun safety and mole surveillance. Counseled about the dangers of distracted driving. Given 10 year screening recommendations.

## 2024-02-25 ENCOUNTER — Encounter: Payer: Self-pay | Admitting: *Deleted

## 2024-02-25 NOTE — Progress Notes (Signed)
 Clifford [name]                                          MRN: [id number]   02/25/2024   The [facility] Quality Team Specialist reviewed this patient medical record for the purposes of chart review for care gap closure. The following were reviewed: abstraction for care gap closure-kidney health evaluation for diabetes:eGFR  and uACR.    [facility] Quality Team

## 2024-03-02 ENCOUNTER — Telehealth: Payer: Self-pay

## 2024-03-02 NOTE — Telephone Encounter (Signed)
 Copied from CRM [id number]. Topic: Clinical - Medical Advice >> Mar 02, 2024  9:01 AM Deleta RAMAN wrote: Reason for CRM: patient would like to know if provider will change his medication due to results from physical appointment. Has other concerns as well [phone]

## 2024-03-04 ENCOUNTER — Ambulatory Visit: Payer: Self-pay | Admitting: Internal Medicine

## 2024-03-06 MED ORDER — EMPAGLIFLOZIN 10 MG PO TABS: 10.0000 mg | ORAL_TABLET | Freq: Every day | ORAL | 0 refills | Status: AC

## 2024-03-06 MED ORDER — EMPAGLIFLOZIN 25 MG PO TABS: 25.0000 mg | ORAL_TABLET | Freq: Every day | ORAL | 3 refills | Status: AC

## 2024-03-09 ENCOUNTER — Ambulatory Visit: Payer: Self-pay

## 2024-03-09 NOTE — Telephone Encounter (Signed)
 FYI Only or Action Required?: FYI only for provider: educated.  Patient was last seen in primary care on 02/24/2024 by Rollene Almarie LABOR, MD.  Called Nurse Triage reporting Fatigue.  Symptoms began na.  Interventions attempted: Nothing.  Symptoms are: stable.  Triage Disposition: Information or Advice Only Call  Patient/caregiver understands and will follow disposition?: Yes  Copied from CRM [id number]. Topic: Clinical - Medical Advice >> Mar 09, 2024  2:18 PM Rea ORN wrote: Reason for CRM: Pt called to inquiry about side effects of jardiance . He heard about a correlation between jardiance  and genital warts. Pt also said that he noticed sometimes since taking Jardiance , he feels woozy. When asked to elaborate, pt stated that he just feels like he still a sleep but not dizzy or lightheaded. He didn't want an appt sooner than his scheduled 3 month follow up because he wanted this new rx to start working. He said that feeling goes away and does not last long Reason for Disposition  Health information question, no triage required and triager able to answer question  Answer Assessment - Initial Assessment Questions 1. REASON FOR CALL: What is the main reason for your call? or How can I best help you?     Questions on potential side effects of Jardiance . Specifically about necrotizing infections to perineum. Reviewed at length that it is a rare potential complication but that with diabetes you are monitoring for potential infections anyways so if anything abnormal is occurring to the area to alert the MD or EMS based on symptoms. He has had fatigue, and decreased activity tolerance since hospitalization in September and felt off/woozy starting jardiance  previously. Was stopped and BGL increased- patient restarted medication and wanting to just be sure what to look out for. Discussed with wife steps to take if any issues.  Protocols used: Information Only Call - No Triage-A-AH

## 2024-03-11 NOTE — Telephone Encounter (Signed)
 Addressed in result note.

## 2024-03-16 NOTE — Progress Notes (Signed)
 "  Cardiology Office Note:    Date:  03/26/2024   ID:  Clifford [name], Clifford [name] [birth year], MRN [id number]  PCP:  Rollene Almarie LABOR, MD  Cardiologist:  Dr. Maude Emmer   Electrophysiologist:  n/a  Referring MD: Rollene Almarie LABOR, MD    History of Present Illness:     Clifford [name] is a 79 y.o. male with a hx of CAD status post prior PCI in 1995, 2000 and 2002, diabetes, HTN, HL, prostate CA status post prostatectomy.    May 2017 accelerated angina needing CABG Dr Army  LIMA-LAD, SVG-PLVB, SVG-DX.   No angina compliant with meds  BP seems to be on the high side  2nd marriage now on 32 years. Has 2 siblings in [place] and one in [place] area  A1c too elevated Discussed diet  Functional status declining with fatigue  He has some proprioception issues with his LE;s and knowing where his feet are going. Discussed high A1c started on Jardiance  by primary Wife Sherrie with him today. They met at church    Past Medical History:  Diagnosis Date   CAD (coronary artery disease) 02/27/2003   a. Previous stents RCA and circ, Cath 2005 patient with 70% PDA  //  b. [place]  5/17 >> LHC oLCx 50, pLAD 50, mLAD 95, RPDA 80, normal LVEF >> s/p CABG   Cancer (HCC)    Carotid artery disease    a. Carotid US  5/17 bilat ICA 1-39%   Cataract    replace 11/2012   Diabetes mellitus    diet controlled   History of detached retina repair 11/06/2006   [facility]   History of echocardiogram    a. EF 55% to 60%. Wall motion was normal, Gr 1 DD, Trivial MR/TR   History of hematuria    History of prostate cancer 03/29/2005   s/p prostatectomy   Hyperlipidemia    Hypertension     Past Surgical History:  Procedure Laterality Date   ANGIOPLASTY     1995,2000,2002   CARDIAC CATHETERIZATION  07/26/2000   Initially the stenosis in the proximal to mid right coronary artery was estimated ar 95%. Following stenting, this improved to 0%. There was residual mild disease in the sostium of  the proximal right coronary atrtery. Successful stentng of the proximal to mid right coronary artery with improvement in stent diameter narrowing from 95% to 0% Proc Date  07/29/2000   CARDIAC CATHETERIZATION N/A 07/04/2015   Procedure: Left Heart Cath and Coronary Angiography;  Surgeon: Candyce GORMAN Reek, MD;  Location: [facility] INVASIVE CV LAB;  Service: Cardiovascular;  Laterality: N/A;   CATARACT EXTRACTION W/ INTRAOCULAR LENS IMPLANT Bilateral OS Sept '14; OD Oct '14   Dr. camillo   COLONOSCOPY  04/12/2015   Dr.Gessner   CORONARY ARTERY BYPASS GRAFT N/A 07/08/2015   Procedure: CORONARY ARTERY BYPASS GRAFTING (CABG)x three using left internal mammary artery to left anterior decending artery, left greater saphenous vein grafts to diagonal and posterolateral. Left leg greater saphenous leg vein via endoscope. ;  Surgeon: Dallas KATHEE Army, MD;  Location: [facility] OR;  Service: Open Heart Surgery;  Laterality: N/A;   CYSTOSCOPY N/A 07/08/2015   Procedure: CYSTOSCOPY FLEXIBLE WITH BALLOON DILATION OF BLADDER NECK CONTRACTURE WITH DIFFICULT CATHETER PLACEMENT;  Surgeon: Gretel Ferrara, MD;  Location: [facility] OR;  Service: Urology;  Laterality: N/A;   EYE SURGERY  11/24/2012   left eye cataract   EYE SURGERY  12/22/2012   right eye cataract   HEMORRHOID SURGERY  I&D of thrombosed hemorrhoid   LIPOMA EXCISION     POLYPECTOMY     PROSTATECTOMY  03/29/2005   PTCA  02/27/2003   TEE WITHOUT CARDIOVERSION N/A 07/08/2015   Procedure: TRANSESOPHAGEAL ECHOCARDIOGRAM (TEE);  Surgeon: Dallas KATHEE Jude, MD;  Location[facility] OR;  Service: Open Heart Surgery;  Laterality: N/A;   WRIST GANGLION EXCISION      Current Medications: Outpatient Medications Prior to Visit  Medication Sig Dispense Refill   aspirin  EC 81 MG tablet Take 1 tablet (81 mg total) by mouth daily. 30 tablet 11   empagliflozin  (JARDIANCE ) 10 MG TABS tablet Take 1 tablet (10 mg total) by mouth daily. 30 tablet 0   [START ON 04/05/2024] empagliflozin   (JARDIANCE ) 25 MG TABS tablet Take 1 tablet (25 mg total) by mouth daily. 90 tablet 3   glimepiride  (AMARYL ) 2 MG tablet TAKE ONE TABLET BY MOUTH ONCE A DAY WITH BREAKFAST 90 tablet 0   guaiFENesin  (ROBITUSSIN) 100 MG/5ML liquid Take 5 mLs by mouth every 4 (four) hours as needed for cough or to loosen phlegm. 118 mL 0   lisinopril  (ZESTRIL ) 20 MG tablet Take 1 tablet (20 mg total) by mouth daily. 90 tablet 3   Multiple Vitamins-Minerals (MENS 50+ MULTI VITAMIN/MIN) TABS Take 1 tablet by mouth daily.     ONE TOUCH ULTRA TEST test strip 1 each by Other route 2 (two) times daily. Use to check blood sugars twice a day Dx E11.9 100 each 5   ONETOUCH DELICA LANCETS 33G MISC Use to help check blood sugars twice a day Dx e11.9 100 each 5   rosuvastatin  (CRESTOR ) 40 MG tablet Take 1 tablet (40 mg total) by mouth daily. 90 tablet 3   No facility-administered medications prior to visit.      Allergies:   Cinnamon   Social History   Socioeconomic History   Marital status: Married    Spouse name: Joen   Number of children: Not on file   Years of education: Not on file   Highest education level: Not on file  Occupational History   Occupation: RETIRED  Tobacco Use   Smoking status: Former    Current packs/day: 0.00    Types: Cigarettes    Quit date: 02/27/1983    Years since quitting: 41.1   Smokeless tobacco: Never  Vaping Use   Vaping status: Never Used  Substance and Sexual Activity   Alcohol  use: No    Alcohol /week: 0.0 standard drinks of alcohol    Drug use: No   Sexual activity: Yes    Partners: Female  Other Topics Concern   Not on file  Social History Narrative   Engineer, Agricultural, 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    0 children, work; dealer, architect. ACP - discussed with patient and provided packet and referral to [email or web address]., Nov '15. He already has stated that if he is :comatose pull the plug.       Lives with  wife/2025   Social Drivers of Health   Tobacco Use: Medium Risk (02/24/2024)   Patient History    Smoking Tobacco Use: Former    Smokeless Tobacco Use: Never    Passive Exposure: Not on file  Financial Resource Strain: Not on file  Food Insecurity: No Food Insecurity (01/28/2024)   Epic    Worried About Programme Researcher, Broadcasting/film/video in the Last Year: Never true    Ran Out of Food in the Last Year: Never true  Transportation Needs: No Transportation Needs (01/28/2024)   Epic    Lack of Transportation (Medical): No    Lack of Transportation (Non-Medical): No  Physical Activity: Sufficiently Active (01/28/2024)   Exercise Vital Sign    Days of Exercise per Week: 7 days    Minutes of Exercise per Session: 60 min  Stress: No Stress Concern Present (01/28/2024)   [employer] of Occupational Health - Occupational Stress Questionnaire    Feeling of Stress: Not at all  Social Connections: Socially Integrated (01/28/2024)   Social Connection and Isolation Panel    Frequency of Communication with Friends and Family: More than three times a week    Frequency of Social Gatherings with Friends and Family: Once a week    Attends Religious Services: More than 4 times per year    Active Member of Clubs or Organizations: Yes    Attends Banker Meetings: More than 4 times per year    Marital Status: Married  Depression (PHQ2-9): Low Risk (01/28/2024)   Depression (PHQ2-9)    PHQ-2 Score: 0  Alcohol  Screen: Not on file  Housing: Unknown (01/28/2024)   Epic    Unable to Pay for Housing in the Last Year: No    Number of Times Moved in the Last Year: Not on file    Homeless in the Last Year: No  Utilities: Not At Risk (01/28/2024)   Epic    Threatened with loss of utilities: No  Health Literacy: Adequate Health Literacy (01/28/2024)   B1300 Health Literacy    Frequency of need for help with medical instructions: Never     Family History:  The patient's family history includes CAD in his  father; Colon polyps in his sister; Diabetes in his sister.   ROS:   Please see the history of present illness.    Review of Systems  Cardiovascular:  Positive for chest pain and leg swelling.  Gastrointestinal:  Positive for constipation.   All other systems reviewed and are negative.   Physical Exam:    VS:  BP (!) 140/90 (BP Location: Left Arm, Patient Position: Sitting, Cuff Size: Normal)   Pulse 70   Ht 5' 2 (1.575 m)   Wt 173 lb (78.5 kg)   BMI 31.64 kg/m    Repeat BP 138/70 mmHg by me  Affect appropriate Healthy:  appears stated age HEENT: Scar over lip  Neck supple with no adenopathy JVP normal no bruits no thyromegaly Lungs clear with no wheezing and good diaphragmatic motion Heart:  S1/S2 no murmur, no rub, gallop or click PMI normal post sternotomy  Abdomen: benighn, BS positve, no tenderness, no AAA no bruit.  No HSM or HJR Distal pulses intact with no bruits Trace LE bilateral  edema Neuro non-focal Skin warm and dry No muscular weakness   Wt Readings from Last 3 Encounters:  03/26/24 173 lb (78.5 kg)  02/24/24 176 lb (79.8 kg)  01/28/24 169 lb (76.7 kg)      Studies/Labs Reviewed:     EKG:  1120//20 SR rate 90 old IMI   Recent Labs: 10/26/2023: B Natriuretic Peptide 112.4 10/27/2023: Magnesium  1.9 02/24/2024: ALT 20; BUN 21; Creatinine, Ser 1.47; Hemoglobin 16.2; Platelets 224.0; Potassium 4.5; Sodium 140 03/26/2024 NSR rate 78 Normal   Recent Lipid Panel    Component Value Date/Time   CHOL 129 02/24/2024 0840   TRIG 192.0 (H) 02/24/2024 0840   TRIG 133 02/25/2006 0822   HDL 37.60 (L) 02/24/2024 0840   CHOLHDL 3  02/24/2024 0840   VLDL 38.4 02/24/2024 0840   LDLCALC 53 02/24/2024 0840   LDLDIRECT 60.0 04/24/2022 0853    Additional studies/ records that were reviewed today include:   Intraop TEE 07/08/15 EF 55% to 60%. Wall motion was normal  Carotid US  07/06/15 Bilaterally - 1% to 39% ICA stenosis. Vertebral artery flow is  antegrade.  Echo 07/06/15 EF 55% to 60%. Wall motion was normal, Gr 1 DD, Trivial MR/TR  LHC 07/04/15 Ost Cx lesion, 50% stenosed. Mid LAD lesion, 95% stenosed. Severe, diffuse disease in the mid to distal LAD which is all heavily calcified. The origin of a moderate-sized diagonal is also compromised. Prox LAD to Mid LAD lesion, 50% stenosed. RPDA lesion, 80% stenosed. The left ventricular systolic function is normal. Normal LVEDP.   ASSESSMENT:     No diagnosis found.   PLAN:     In order of problems listed above:  1. CABG:  07/08/15  Continue ASA and beta blocker no angina - stable   2. HTN - ACE increased improved   3. HL - LDL at goal    4. CKD - stable f/u primary avoid over diuresis  Cr  1.47   5. Carotid artery disease - Mild bilat plaque by US  01/28/19 observe   6. DM - A1c too high  9.7  f/u [facility] Currently on Jardiance  and amaryl   7.  Fatigue/Leg weakness:  f/u primary seems more like neuropathic issues from poorly controlled DM    F/U in a year    Maude Emmer     "

## 2024-03-26 ENCOUNTER — Encounter: Payer: Self-pay | Admitting: Cardiovascular Disease

## 2024-03-26 ENCOUNTER — Ambulatory Visit: Attending: Cardiovascular Disease | Admitting: Cardiovascular Disease

## 2024-03-26 VITALS — BP 140/90 | HR 70 | Ht 62.0 in | Wt 173.0 lb

## 2024-03-26 DIAGNOSIS — I10 Essential (primary) hypertension: Secondary | ICD-10-CM

## 2024-03-26 DIAGNOSIS — Z951 Presence of aortocoronary bypass graft: Secondary | ICD-10-CM | POA: Diagnosis not present

## 2024-03-26 DIAGNOSIS — E782 Mixed hyperlipidemia: Secondary | ICD-10-CM | POA: Diagnosis not present

## 2024-03-26 NOTE — Patient Instructions (Signed)
 Medication Instructions: Your physician recommends that you continue on your current medications as directed. Please refer to the Current Medication list given to you today.   *If you need a refill on your cardiac medications before your next appointment, please call your pharmacy*  Lab Work: NONE    Testing/Procedures: NONE    Follow-Up: At [facility], you and your health needs are our priority.  As part of our continuing mission to provide you with exceptional heart care, our providers are all part of one team.  This team includes your primary Cardiologist (physician) and Advanced Practice Providers or APPs (Physician Assistants and Nurse Practitioners) who all work together to provide you with the care you need, when you need it.  Your next appointment:   1 year(s)  Provider:   Maude Emmer, MD     Other Instructions

## 2024-05-22 ENCOUNTER — Ambulatory Visit: Admitting: Internal Medicine
